# Patient Record
Sex: Male | Born: 1940
Health system: Southern US, Community
[De-identification: ages and names within clinical notes are randomized; demographics above are authoritative.]

## PROBLEM LIST (undated history)

## (undated) DIAGNOSIS — E785 Hyperlipidemia, unspecified: Secondary | ICD-10-CM

## (undated) DIAGNOSIS — F419 Anxiety disorder, unspecified: Secondary | ICD-10-CM

## (undated) DIAGNOSIS — N210 Calculus in bladder: Secondary | ICD-10-CM

## (undated) DIAGNOSIS — G4733 Obstructive sleep apnea (adult) (pediatric): Secondary | ICD-10-CM

## (undated) DIAGNOSIS — I219 Acute myocardial infarction, unspecified: Secondary | ICD-10-CM

## (undated) DIAGNOSIS — Z8551 Personal history of malignant neoplasm of bladder: Secondary | ICD-10-CM

## (undated) DIAGNOSIS — I10 Essential (primary) hypertension: Secondary | ICD-10-CM

## (undated) DIAGNOSIS — Z87442 Personal history of urinary calculi: Secondary | ICD-10-CM

## (undated) DIAGNOSIS — C61 Malignant neoplasm of prostate: Secondary | ICD-10-CM

## (undated) DIAGNOSIS — I252 Old myocardial infarction: Secondary | ICD-10-CM

## (undated) DIAGNOSIS — M1990 Unspecified osteoarthritis, unspecified site: Secondary | ICD-10-CM

## (undated) DIAGNOSIS — F329 Major depressive disorder, single episode, unspecified: Secondary | ICD-10-CM

## (undated) DIAGNOSIS — F32A Depression, unspecified: Secondary | ICD-10-CM

## (undated) DIAGNOSIS — N400 Enlarged prostate without lower urinary tract symptoms: Secondary | ICD-10-CM

## (undated) DIAGNOSIS — K219 Gastro-esophageal reflux disease without esophagitis: Secondary | ICD-10-CM

## (undated) DIAGNOSIS — C679 Malignant neoplasm of bladder, unspecified: Secondary | ICD-10-CM

## (undated) HISTORY — PX: BACK SURGERY: SHX140

## (undated) HISTORY — DX: Unspecified osteoarthritis, unspecified site: M19.90

## (undated) HISTORY — PX: PROSTATE BIOPSY: SHX241

## (undated) HISTORY — PX: CORONARY ANGIOPLASTY: SHX604

## (undated) HISTORY — PX: OTHER SURGICAL HISTORY: SHX169

## (undated) HISTORY — DX: Malignant neoplasm of bladder, unspecified: C67.9

## (undated) HISTORY — DX: Hyperlipidemia, unspecified: E78.5

## (undated) NOTE — *Deleted (*Deleted)
Physician Discharge Summary  Patient ID: [name] MRN: [id number] DOB/AGE: 08-Sep-1941 27 y.o. Primary Care Provider: Myrlene Broker, MD Primary Cardiologist: Dr. Percival Spanish.    Admit date: 08/22/2020 Discharge date: 08/29/2020  Admission Diagnoses: Unstable angina  Discharge Diagnoses:  Principal Problem:   Unstable angina [facility]) Active Problems:   Essential hypertension   Angina, class II ([place]) - New onset   Hyperlipidemia with target LDL less than 70; statin intolerant.  Now on Repatha   Severe aortic stenosis   S/P CABG x 4  Patient Active Problem List   Diagnosis Date Noted  . S/P CABG x 4 08/26/2020  . Unstable angina ([place]) 08/22/2020  . Severe aortic stenosis 11/22/2019  . Educated about COVID-19 virus infection 11/22/2019  . CAD (coronary artery disease), native coronary artery 04/22/2019  . Angina, class II ([place]) - New onset 04/22/2019  . Hyperlipidemia with target LDL less than 70; statin intolerant.  Now on Repatha 04/22/2019  . S/P lumbar laminectomy 04/04/2017  . Malignant neoplasm of prostate ([place]) 03/08/2016  . Complicated UTI (urinary tract infection) 04/21/2013  . Essential hypertension 04/21/2013  . Urinary bladder stone 04/21/2013   History of Present Illness:  At time of consultation:   Anthony Hartman lives in [place], [place] with his wife. He is very active working outside. He has had regular dental work with no ongoing issues.  He plays golf a few times per week.  He has been followed by Dr. Percival Spanish for CAD and aortic stenosis. His cardiac history dates back to 2012 when he was found to have an occluded OM, which was managed medically. He then presented with an inferior MI in 2017 and underwent PCI/BMS to RCA. Cath in 2018 showed an occluded ramus that could not be crossed. He then represented in 04/2019 with [place]. Cath at that time showed 95% 2nd diagonal stenosis managed with balloon angioplasty, with medically managed patent RCA stent with 25%  ISR, 99% OM1 stenosis (essentially a CTO), 25% mLCx stenosis, 25% pLAD stenosis, 50% mLAD stenosis, with EF 55-65% and moderate AS.  He underwent ETT in 05/2020 which did not show any high risk findings, although it was a submaximal study. Echo in 05/2020 showed EF 55-60%, mod LVH, and severe AS with a mean gradient of 46, DVI 0.2, AVA 0.7. He was seen in the office by Dr. Percival Spanish and felt to be minimally symptomatic and close follow up was recommended.   He was in his usual state of health until the early morning of 10/11 when he was awoken from sleep with chest pain. EMS was activated and he was brought to [facility]. Labs showed Cr 1.5>1.4, Hgb 18, PLT 187, LDL 68, HsTrop 247--> 736--> 1625. EKG with sinus rhythm, rate 64 bpm, chronic RBBB, new inferolateral TWI, submm STE in aVR, V1-3. CXR without acute findings. Respiratory panel negative for influenza/COVID-19. He was given SL nitro x2 and aspirin en route to the ED and IV fentanyl with resolution of chest pain upon arrival. He was started on a heparin gtt and admitted by cardiology.   Repeat echo 10/11 showed EF 50-55%, mod concentric LVH, mild hypokinesis of LV, basal anterior wasll, anterolateral wall and inferolateral wall, severe LAE, mild-mod RAE, moderate MAC, severe AS with mean gradient of 42 mm hg, peak gradient 69.6 mm hg, AVA 0.65 cm2, DVI 0.17, moderate AI and moderate PR. L/RHC 10/12 showed severe mid LAD stenosis at the takeoff of the large Diagonal branch (LAD lesion is flow limiting by pressure  wire (DFR 0.84)). The Diagonal ostial stenosis has recurred following balloon angioplasty in June 2020. He has had a functional occlusion of an obtuse marginal branch for years with prior failed PCI of this branch. It was felt he would need surgical AVR with bypass vs TAVR and high risk PCI. The intervention in the LAD and Diagonal would be difficult and there is a chance we would not have an optimal result with bifurcation stenting or primary  stenting of the LAD with angioplasty alone of the Diagonal.   Given multivessel CAD and severe AS, cardiothoracic surgery was consulted for coronary revascularization and AVR options.   He reports that he has had fatigue over the past year but has been to high activity level working in his yard and playing golf.  He has had occasional episodes of chest discomfort particularly when he is doing heavy work in his yard.  These have been very mild compared to the episode that brought him to the hospital.  He denies any shortness of breath.  He has had no dizziness or syncope.      Discharged Condition: {condition:18240}  Hospital Course: ***  Consults: {consultation:18241}  Significant Diagnostic Studies: {diagnostics:18242}   ECHOCARDIOGRAM REPORT       Patient Name:  Anthony Hartman Date of Exam: 08/22/2020  Medical Rec #: [id number]   Height:    76.0 in  Accession #:  [id number]   Weight:    230.0 lb  Date of Birth: 1940-12-30   BSA:     2.351 m  Patient Age:  58 years    BP:      117/65 mmHg  Patient Gender: M       HR:      62 bpm.  Exam Location: Inpatient   Procedure: 2D Echo, Cardiac Doppler and Color Doppler   Indications:  R07.9* Chest pain, unspecified    History:    Patient has prior history of Echocardiogram examinations,  most         recent 05/23/2020. Previous Myocardial Infarction; Risk         Factors:Hypertension, Dyslipidemia and Sleep Apnea.  Cancer.         GERD.    Sonographer:  Jonelle Sidle Dance  Referring Phys: [id number] [place]    1. Left ventricular ejection fraction, by estimation, is 50 to 55%. The  left ventricle has low normal function. The left ventricle demonstrates  regional wall motion abnormalities (see scoring diagram/findings for  description). There is moderate  concentric left ventricular hypertrophy. Left ventricular diastolic   parameters are consistent with Grade I diastolic dysfunction (impaired  relaxation). There is mild hypokinesis of the left ventricular, basal  anterior wall, anterolateral wall and  inferolateral wall.  2. Right ventricular systolic function is normal. The right ventricular  size is normal.  3. Left atrial size was severely dilated.  4. Right atrial size was mild to moderately dilated.  5. The mitral valve is normal in structure. Trivial mitral valve  regurgitation. No evidence of mitral stenosis. Moderate mitral annular  calcification.  6. The aortic valve is calcified. There is severe calcifcation of the  aortic valve. There is severe thickening of the aortic valve. Aortic valve  regurgitation is moderate. Severe aortic valve stenosis. Aortic valve  area, by VTI measures 0.63 cm. Aortic  valve mean gradient measures 42.0 mmHg. Aortic valve Vmax measures 4.17  m/s.  7. Pulmonic valve regurgitation is moderate.  8. The inferior vena cava is  normal in size with greater than 50%  respiratory variability, suggesting right atrial pressure of 3 mmHg.  9. Possible left to right shunt cannot be excluded.   Comparison(s): Changes from prior study are noted.   Conclusion(s)/Recommendation(s): Severe AS with moderate AI. EF overal low  normal, but on several views the basal anterior, anteriolateral, and  inferolateral walls are mildly hypokinetic.   FINDINGS  Left Ventricle: Left ventricular ejection fraction, by estimation, is 50  to 55%. The left ventricle has low normal function. The left ventricle  demonstrates regional wall motion abnormalities. Mild hypokinesis of the  left ventricular, basal anterior  wall, anterolateral wall and inferolateral wall. The left ventricular  internal cavity size was normal in size. There is moderate concentric left  ventricular hypertrophy. Left ventricular diastolic parameters are  consistent with Grade I diastolic  dysfunction (impaired  relaxation).   Right Ventricle: The right ventricular size is normal. No increase in  right ventricular wall thickness. Right ventricular systolic function is  normal.   Left Atrium: Left atrial size was severely dilated.   Right Atrium: Right atrial size was mild to moderately dilated.   Pericardium: There is no evidence of pericardial effusion. Presence of  pericardial fat pad.   Mitral Valve: The mitral valve is normal in structure. There is moderate  thickening of the mitral valve leaflet(s). There is mild calcification of  the mitral valve leaflet(s). Moderate mitral annular calcification.  Trivial mitral valve regurgitation. No  evidence of mitral valve stenosis.   Tricuspid Valve: The tricuspid valve is normal in structure. Tricuspid  valve regurgitation is trivial. No evidence of tricuspid stenosis.   Aortic Valve: The aortic valve is calcified. There is severe calcifcation  of the aortic valve. There is severe thickening of the aortic valve. There  is moderate aortic valve annular calcification. Aortic valve regurgitation  is moderate. Aortic  regurgitation PHT measures 365 msec. Severe aortic stenosis is present.  Aortic valve mean gradient measures 42.0 mmHg. Aortic valve peak gradient  measures 69.6 mmHg. Aortic valve area, by VTI measures 0.63 cm.   Pulmonic Valve: The pulmonic valve was grossly normal. Pulmonic valve  regurgitation is moderate. No evidence of pulmonic stenosis.   Aorta: The aortic root and ascending aorta are structurally normal, with  no evidence of dilitation.   Venous: The inferior vena cava is normal in size with greater than 50%  respiratory variability, suggesting right atrial pressure of 3 mmHg.   IAS/Shunts: Possible left to right shunt cannot be excluded.     LEFT VENTRICLE  PLAX 2D  LVIDd:     4.95 cm Diastology  LVIDs:     4.27 cm LV e' medial:  3.48 cm/s  LV PW:     1.38 cm LV E/e' medial: 17.7  LV IVS:     1.42 cm LV e' lateral:  5.55 cm/s  LVOT diam:   2.20 cm LV E/e' lateral: 11.1  LV SV:     72  LV SV Index:  31  LVOT Area:   3.80 cm     RIGHT VENTRICLE      IVC  RV Basal diam: 3.28 cm  IVC diam: 1.69 cm  RV Mid diam:  2.06 cm  RV S prime:   7.94 cm/s  TAPSE (M-mode): 1.8 cm   LEFT ATRIUM       Index    RIGHT ATRIUM      Index  LA diam:    4.80 cm 2.04 cm/m RA Area:  20.80 cm  LA Vol (A2C):  105.0 ml 44.67 ml/m RA Volume:  67.90 ml 28.88 ml/m  LA Vol (A4C):  172.0 ml 73.17 ml/m  LA Biplane Vol: 148.0 ml 62.96 ml/m  AORTIC VALVE  AV Area (Vmax):  0.65 cm  AV Area (Vmean):  0.63 cm  AV Area (VTI):   0.63 cm  AV Vmax:      417.00 cm/s  AV Vmean:     301.000 cm/s  AV VTI:      1.140 m  AV Peak Grad:   69.6 mmHg  AV Mean Grad:   42.0 mmHg  LVOT Vmax:     71.70 cm/s  LVOT Vmean:    49.800 cm/s  LVOT VTI:     0.189 m  LVOT/AV VTI ratio: 0.17  AI PHT:      365 msec    AORTA  Ao Root diam: 3.20 cm  Ao Asc diam: 3.40 cm   MITRAL VALVE  MV Area (PHT): 2.62 cm  SHUNTS  MV Decel Time: 289 msec  Systemic VTI: 0.19 m  MV E velocity: 61.60 cm/s Systemic Diam: 2.20 cm  MV A velocity: 77.10 cm/s  MV E/A ratio: 0.80   Bridgette Christopher MD  Electronically signed by Buford Dresser MD  Signature Date/Time: 08/22/2020/3:37:03 PM       Cardiac Cath: INTRAVASCULAR PRESSURE WIRE/FFR STUDY  RIGHT/LEFT HEART CATH AND CORONARY ANGIOGRAPHY  Conclusion   Severe aortic stenosis with recent progression in symptoms causing significant limitation in physical activity.  Unable to cross the aortic valve for hemodynamic assessment.  [place] 111 mid vessel bifurcation stenosis.  The previously dilated diagonal branch has restenosed to greater than 90%.  The previously untreated LAD has DFR of 0.84.  Obtuse marginal #1 with 99% stenosis with competitive flow.   This artery functions as a total occlusion and has had 2 failed PCI attempts in the past.  Left main is widely patent  RCA is dominant, tortuous, but does not have any focal high-grade stenosis.  There is diffuse disease throughout the vessel up to 40 to 50%.  Normal pulmonary artery pressures.  Pulmonary capillary wedge mean pressure is 9 mmHg.  RECOMMENDATIONS:   Symptomatic aortic stenosis and angina due to significant obtuse marginal and LAD diagonal disease.  Heart valve team consideration of complex PCI followed by TAVR versus aortic valve replacement along with coronary bypass grafting to the LAD, diagonal, and first obtuse marginal.     Treatments: {Tx:18249}  Discharge Exam: Blood pressure 127/62, pulse 73, temperature 97.8 F (36.6 C), temperature source Oral, resp. rate (!) 9, height _0  (1.93 m), weight 108.1 kg, SpO2 96 %. {physical QTMA:2633354}  Disposition:   Discharge Instructions    AMB Referral to Cardiac Rehabilitation - Phase II   Complete by: As directed    Diagnosis:  CABG Valve Replacement     Valve: Aortic   CABG X ___: 4   After initial evaluation and assessments completed: Virtual Based Care may be provided alone or in conjunction with Phase 2 Cardiac Rehab based on patient barriers.: Yes     Allergies as of 08/29/2020      Reactions   Statins    MD ORDERS UNSPECIFIED REACTION     Augmentin [amoxicillin-pot Clavulanate] Nausea And Vomiting   Has patient had a PCN reaction causing immediate rash, facial/tongue/throat swelling, SOB or lightheadedness with hypotension: No Has patient had a PCN reaction causing severe rash involving mucus membranes or skin necrosis: No Has patient  had a PCN reaction that required hospitalization No Has patient had a PCN reaction occurring within the last 10 years: No If all of the above answers are "NO", then may proceed with Cephalosporin use.   Dexamethasone Other (See Comments)   Frequent Urination [?  HYPERGLYCEMIA? ]   Sulfa Antibiotics Nausea And Vomiting    Med Rec must be completed prior to using this [place]***       Follow-up Information    Minus Breeding, MD Follow up.   Specialty: Cardiology Why: Please see discharge paperwork for follow-up appointments with cardiology.  Appointments are also available on [place]. Contact information: [address] [place] [address] [phone]        Gaye Pollack, MD Follow up.   Specialty: Cardiothoracic Surgery Why: Please see discharge paperwork for follow-up appointments with surgeon.  On the date you see Dr. Cyndia Bent please also obtain a chest x-ray at [place] 1/2-hour prior to appointment.  It is located in the same office complex on the first floor. Contact information: [place] [address] [phone]              The patient has been discharged on:   1.Beta Blocker:  Yes [   ]                              No   [   ]                              If No, reason:  2.Ace Inhibitor/ARB: Yes [   ]                                     No  [    ]                                     If No, reason:  3.Statin:   Yes [   ]                  No  [   ]                  If No, reason:  4.Ecasa:  Yes  [   ]                  No   [   ]                  If No, reason:  Signed: John Giovanni 08/29/2020, 8:43 AM

---

## 2000-01-12 ENCOUNTER — Encounter: Payer: Self-pay | Admitting: Urology

## 2000-01-12 ENCOUNTER — Encounter: Admission: RE | Admit: 2000-01-12 | Discharge: 2000-01-12 | Payer: Self-pay | Admitting: Urology

## 2000-01-15 ENCOUNTER — Ambulatory Visit (HOSPITAL_BASED_OUTPATIENT_CLINIC_OR_DEPARTMENT_OTHER): Admission: RE | Admit: 2000-01-15 | Discharge: 2000-01-15 | Payer: Self-pay | Admitting: Urology

## 2000-01-15 ENCOUNTER — Encounter (INDEPENDENT_AMBULATORY_CARE_PROVIDER_SITE_OTHER): Payer: Self-pay | Admitting: *Deleted

## 2000-07-22 ENCOUNTER — Ambulatory Visit (HOSPITAL_BASED_OUTPATIENT_CLINIC_OR_DEPARTMENT_OTHER): Admission: RE | Admit: 2000-07-22 | Discharge: 2000-07-22 | Payer: Self-pay | Admitting: Urology

## 2000-07-22 ENCOUNTER — Encounter (INDEPENDENT_AMBULATORY_CARE_PROVIDER_SITE_OTHER): Payer: Self-pay | Admitting: Specialist

## 2002-05-19 ENCOUNTER — Encounter: Admission: RE | Admit: 2002-05-19 | Discharge: 2002-05-19 | Payer: Self-pay | Admitting: Urology

## 2002-05-19 ENCOUNTER — Encounter: Payer: Self-pay | Admitting: Urology

## 2002-05-22 ENCOUNTER — Encounter (INDEPENDENT_AMBULATORY_CARE_PROVIDER_SITE_OTHER): Payer: Self-pay | Admitting: Specialist

## 2002-05-22 ENCOUNTER — Ambulatory Visit (HOSPITAL_BASED_OUTPATIENT_CLINIC_OR_DEPARTMENT_OTHER): Admission: RE | Admit: 2002-05-22 | Discharge: 2002-05-22 | Payer: Self-pay | Admitting: Urology

## 2002-06-08 ENCOUNTER — Encounter: Admission: RE | Admit: 2002-06-08 | Discharge: 2002-06-08 | Payer: Self-pay | Admitting: Urology

## 2002-06-08 ENCOUNTER — Encounter: Payer: Self-pay | Admitting: Urology

## 2002-09-21 ENCOUNTER — Ambulatory Visit (HOSPITAL_BASED_OUTPATIENT_CLINIC_OR_DEPARTMENT_OTHER): Admission: RE | Admit: 2002-09-21 | Discharge: 2002-09-21 | Payer: Self-pay | Admitting: Urology

## 2002-09-21 ENCOUNTER — Encounter (INDEPENDENT_AMBULATORY_CARE_PROVIDER_SITE_OTHER): Payer: Self-pay

## 2003-02-10 ENCOUNTER — Emergency Department (HOSPITAL_COMMUNITY): Admission: EM | Admit: 2003-02-10 | Discharge: 2003-02-10 | Payer: Self-pay | Admitting: Emergency Medicine

## 2003-02-10 ENCOUNTER — Encounter: Payer: Self-pay | Admitting: Emergency Medicine

## 2004-01-06 ENCOUNTER — Encounter: Admission: RE | Admit: 2004-01-06 | Discharge: 2004-01-06 | Payer: Self-pay | Admitting: Urology

## 2004-01-10 ENCOUNTER — Ambulatory Visit (HOSPITAL_COMMUNITY): Admission: RE | Admit: 2004-01-10 | Discharge: 2004-01-10 | Payer: Self-pay | Admitting: Urology

## 2004-01-10 ENCOUNTER — Ambulatory Visit (HOSPITAL_BASED_OUTPATIENT_CLINIC_OR_DEPARTMENT_OTHER): Admission: RE | Admit: 2004-01-10 | Discharge: 2004-01-10 | Payer: Self-pay | Admitting: Urology

## 2004-01-10 ENCOUNTER — Encounter (INDEPENDENT_AMBULATORY_CARE_PROVIDER_SITE_OTHER): Payer: Self-pay | Admitting: Specialist

## 2004-01-10 HISTORY — PX: TRANSURETHRAL RESECTION OF BLADDER TUMOR: SHX2575

## 2006-01-21 ENCOUNTER — Ambulatory Visit (HOSPITAL_COMMUNITY): Admission: RE | Admit: 2006-01-21 | Discharge: 2006-01-21 | Payer: Self-pay | Admitting: Urology

## 2006-08-20 ENCOUNTER — Encounter (INDEPENDENT_AMBULATORY_CARE_PROVIDER_SITE_OTHER): Payer: Self-pay | Admitting: *Deleted

## 2006-08-20 ENCOUNTER — Ambulatory Visit (HOSPITAL_BASED_OUTPATIENT_CLINIC_OR_DEPARTMENT_OTHER): Admission: RE | Admit: 2006-08-20 | Discharge: 2006-08-20 | Payer: Self-pay | Admitting: Urology

## 2007-02-11 HISTORY — PX: OTHER SURGICAL HISTORY: SHX169

## 2007-08-11 ENCOUNTER — Ambulatory Visit (HOSPITAL_BASED_OUTPATIENT_CLINIC_OR_DEPARTMENT_OTHER): Admission: RE | Admit: 2007-08-11 | Discharge: 2007-08-11 | Payer: Self-pay | Admitting: Urology

## 2007-08-11 ENCOUNTER — Encounter: Payer: Self-pay | Admitting: Urology

## 2007-12-30 ENCOUNTER — Encounter: Payer: Self-pay | Admitting: Urology

## 2007-12-30 ENCOUNTER — Ambulatory Visit (HOSPITAL_BASED_OUTPATIENT_CLINIC_OR_DEPARTMENT_OTHER): Admission: RE | Admit: 2007-12-30 | Discharge: 2007-12-30 | Payer: Self-pay | Admitting: Urology

## 2009-01-28 ENCOUNTER — Encounter: Admission: RE | Admit: 2009-01-28 | Discharge: 2009-01-28 | Payer: Self-pay | Admitting: Family Medicine

## 2009-02-10 ENCOUNTER — Encounter: Admission: RE | Admit: 2009-02-10 | Discharge: 2009-02-10 | Payer: Self-pay | Admitting: Family Medicine

## 2009-03-03 ENCOUNTER — Encounter: Admission: RE | Admit: 2009-03-03 | Discharge: 2009-03-03 | Payer: Self-pay | Admitting: Family Medicine

## 2009-05-25 ENCOUNTER — Encounter: Admission: RE | Admit: 2009-05-25 | Discharge: 2009-05-25 | Payer: Self-pay | Admitting: Family Medicine

## 2009-08-21 HISTORY — PX: OTHER SURGICAL HISTORY: SHX169

## 2009-09-21 ENCOUNTER — Ambulatory Visit (HOSPITAL_COMMUNITY): Admission: RE | Admit: 2009-09-21 | Discharge: 2009-09-22 | Payer: Self-pay | Admitting: Orthopaedic Surgery

## 2010-11-12 DIAGNOSIS — I219 Acute myocardial infarction, unspecified: Secondary | ICD-10-CM

## 2010-11-12 HISTORY — DX: Acute myocardial infarction, unspecified: I21.9

## 2011-02-14 LAB — URINALYSIS, ROUTINE W REFLEX MICROSCOPIC
Glucose, UA: NEGATIVE mg/dL
Hgb urine dipstick: NEGATIVE
Ketones, ur: NEGATIVE mg/dL
Nitrite: NEGATIVE
Protein, ur: NEGATIVE mg/dL
Specific Gravity, Urine: 1.029 (ref 1.005–1.030)
Urobilinogen, UA: 0.2 mg/dL (ref 0.0–1.0)
pH: 5.5 (ref 5.0–8.0)

## 2011-02-14 LAB — DIFFERENTIAL
Basophils Absolute: 0.1 10*3/uL (ref 0.0–0.1)
Basophils Relative: 1 % (ref 0–1)
Eosinophils Absolute: 0.2 10*3/uL (ref 0.0–0.7)
Eosinophils Relative: 2 % (ref 0–5)
Lymphocytes Relative: 20 % (ref 12–46)
Lymphs Abs: 1.3 10*3/uL (ref 0.7–4.0)
Monocytes Absolute: 0.6 10*3/uL (ref 0.1–1.0)
Monocytes Relative: 9 % (ref 3–12)
Neutro Abs: 4.4 10*3/uL (ref 1.7–7.7)
Neutrophils Relative %: 67 % (ref 43–77)

## 2011-02-14 LAB — COMPREHENSIVE METABOLIC PANEL
ALT: 36 U/L (ref 0–53)
AST: 34 U/L (ref 0–37)
Albumin: 3.9 g/dL (ref 3.5–5.2)
Alkaline Phosphatase: 57 U/L (ref 39–117)
BUN: 13 mg/dL (ref 6–23)
CO2: 30 mEq/L (ref 19–32)
Calcium: 9.4 mg/dL (ref 8.4–10.5)
Chloride: 103 mEq/L (ref 96–112)
Creatinine, Ser: 0.97 mg/dL (ref 0.4–1.5)
GFR calc Af Amer: >60 mL/min (ref >60)
GFR calc non Af Amer: >60 mL/min (ref >60)
Glucose, Bld: 100 mg/dL — ABNORMAL HIGH (ref 70–99)
Potassium: 4.7 mEq/L (ref 3.5–5.1)
Sodium: 139 mEq/L (ref 135–145)
Total Bilirubin: 0.5 mg/dL (ref 0.3–1.2)
Total Protein: 5.9 g/dL — ABNORMAL LOW (ref 6.0–8.3)

## 2011-02-14 LAB — CBC
HCT: 47.1 % (ref 39.0–52.0)
Hemoglobin: 16.2 g/dL (ref 13.0–17.0)
MCHC: 34.4 g/dL (ref 30.0–36.0)
MCV: 98 fL (ref 78.0–100.0)
Platelets: 261 10*3/uL (ref 150–400)
RBC: 4.81 MIL/uL (ref 4.22–5.81)
RDW: 12.8 % (ref 11.5–15.5)
WBC: 6.6 10*3/uL (ref 4.0–10.5)

## 2011-03-27 NOTE — Op Note (Signed)
NAME:  Anthony Hartman, Anthony Hartman                ACCOUNT NO.:  [id number]   MEDICAL RECORD NO.:  [id number]          PATIENT TYPE:  AMB   LOCATION:  [facility]                         FACILITY:  [facility]   PHYSICIAN:  Bertram Millard. 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   PROCEDURES:  1. Cystoscopy.  2. Bilateral retrograde ureteropyelograms.  3. Bladder biopsies.   SURGEON:  Bertram Millard. Dahlstedt, M.D.   ANESTHESIA:  General with LMA.   COMPLICATIONS:  None.   BRIEF HISTORY:  Anthony Hartman is a 70 year old gentleman who is a long-term  smoker.  He has a history of carcinoma in situ of the bladder.  He has  had several TURBTs and has had both induction and maintenance BCG  treatment.  He has actually had a normal follow-up until recently, when  he had a positive FISH.  Recent cystoscopy revealed some mild  abnormalities of the bladder wall.  It was recommended that he undergo  cystoscopy and bladder biopsies as well as bilateral retrograde  ureteropyelograms to rule out upper tract lesions.  Risks and  complications have been discussed with the patient.  He understands  these and desires to proceed.   DESCRIPTION OF PROCEDURE:  The patient was identified in the holding  area, he received preoperative IV antibiotics and was taken to the  operating room, where a general anesthetic was administered using the  LMA.  He was placed in the dorsal lithotomy position.  Genitalia and  perineum were prepped and draped.  A 22-French panendoscope was advanced  through his urethra.  All aspects of his urethra were normal.  Minimal  obstruction of the prostate.  No prostatic urethral lesions.  Bladder  entered and  inspected circumferentially.  No discrete tumors were noted.  Two to three small erythematous areas were seen in areas consistent with  past TURBTs.  No other discrete lesions were seen.  The was very mild  trabeculation of the bladder.  Bilateral retrograde pyelograms were  performed.   Bilaterally, the ureters were normal.  Pyelocaliceal systems were  normal.  I saw no evidence of ureteral or renal pelvic or caliceal  filling defects on either side.   After the retrogrades were performed, bladder biopsies were taken of  these erythematous areas.  They were sent as, bladder biopsies.  The  biopsy sites were then coagulated with electrocautery.  An area  surrounding the biopsy sites  was also coagulated to ablate all abnormal-looking tissue.  At this  point the scope was removed after the bladder was drained.   The patient tolerated the procedure well.  He was awakened and taken to  the PACU in stable condition.      Bertram Millard. Dahlstedt, M.D.  Electronically Signed     SMD/MEDQ  D:  01/12/2008  T:  01/12/2008  Job:  [id number]   cc:   [name], MD  Fax: [phone]

## 2011-03-27 NOTE — Op Note (Signed)
NAME:  Anthony Hartman, Anthony Hartman                ACCOUNT NO.:  [id number]   MEDICAL RECORD NO.:  [id number]          PATIENT TYPE:  AMB   LOCATION:  [facility]                         [facility]   PHYSICIAN:  Bertram Millard. Dahlstedt, M.D.DATE OF BIRTH:  [birth year]   DATE OF PROCEDURE:  08/11/2007  DATE OF DISCHARGE:                               OPERATIVE REPORT   PREOPERATIVE DIAGNOSIS:  History of carcinoma in situ of bladder, status  post prior resections and BCG x2.   POSTOPERATIVE DIAGNOSIS:  History of carcinoma in situ of bladder,  status post prior resections and BCG x2.   SURGICAL PROCEDURES:  Cystoscopy, bladder biopsies (random and  directed), bilateral renal washings, bilateral retrograde ureteral  pyelograms.   SURGEON:  Bertram Millard. Dahlstedt, M.D.   ANESTHESIA:  General with LMA.   COMPLICATIONS:  None.   SPECIMENS:  To pathology.   BRIEF HISTORY:  This 70 year old male has a several year history of  carcinoma in situ of the bladder.  He has had recurrence despite a prior  course of BCG.  He has completed a second course within the past year to  year and a half.  Recent FISH was positive.  He has some very mild  erythematous patches in his bladder.   As the patient has possible recurrence of his CIS, it was recommended  that he undergo full upper tract evaluation and cystoscopy with bladder  biopsy.  Risks and complications of the procedure were discussed with  the patient.  He understands these and desires to proceed.   DESCRIPTION OF PROCEDURE:  Preoperative IV antibiotics were  administered, the patient was identified in the holding area and then  taken to the operating room where general anesthetic was administered  using LMA.  He was placed in the dorsal lithotomy position.  Genitalia  and perineum were prepped and draped.  A 22-French panendoscope was  advanced through his urethra.  Prostate was not obstructive.  Urethra  was normal.  The bladder had a couple of small  erythematous patches with  some scarring from prior biopsies.  These were in the trigonal or  posterior wall area.  Biopsies were taken at these three sites +1 biopsy  from nearby surrounding normal appearing area.  The base of the bladder  biopsies was then electrocoagulated.   Bilateral renal washings were then performed.  I had to use a guidewire  through a 5-French open-end catheter to navigate the catheter up into  the renal pelves bilaterally.  Using saline, bilateral renal washings  were taken separately.  These were sent as right and left renal  washings, respectively.   Retrograde ureteral pyelograms were then performed bilaterally.  These  showed normal ureters, and normal renal pelves and caliceal systems  bilaterally.  There is no evidence of filling defects or stricture of  the ureters.   At this point the bladder was drained and the procedure terminated.  The  patient was awakened after B & O suppository was placed.  He was taken  to PACU in stable condition.   He will follow-up in approximately 3 weeks.  Discharge medications  include Urelle one p.o. q.6 h p.r.n. urinary discomfort and Cipro 250 mg  one p.o. b.i.d. #6Bertram Millard. Dahlstedt, M.D.  Electronically Signed     SMD/MEDQ  D:  08/11/2007  T:  08/11/2007  Job:  [id number]   cc:   [name], MD  Fax: [phone]

## 2011-03-30 NOTE — Op Note (Signed)
NAME:  Anthony Hartman, Anthony Hartman                          ACCOUNT NO.:  [id number]   MEDICAL RECORD NO.:  [id number]                   PATIENT TYPE:  AMB   LOCATION:  [facility]                                 FACILITY:  [facility]   PHYSICIAN:  Bertram Millard. Dahlstedt, M.D.          DATE OF BIRTH:  27-May-1941   DATE OF PROCEDURE:  01/10/2004  DATE OF DISCHARGE:                                 OPERATIVE REPORT   PREOPERATIVE DIAGNOSIS:  Recurrent transitional cell carcinoma of the  bladder.   POSTOPERATIVE DIAGNOSIS:  Recurrent transitional cell carcinoma of the  bladder.   OPERATION/PROCEDURE:  1. Cystoscopy.  2. Transurethral resection of bladder tumor, small.  3. Random bladder biopsies.   SURGEON:  Bertram Millard. Dahlstedt, M.D.   ANESTHESIA:  General with LMA.   COMPLICATIONS:  None.   BRIEF HISTORY:  This nice 70 year old male was first diagnosed with  carcinoma in situ of his bladder about two years ago.  He has been on BCG  and a research protocol with proper followup/maintenance with BCG as well as  multivitamins.   On routine followup recently, the patient had no urinary symptoms or  hematuria but was found to have  a recurrent lesion near his left ureteral  orifice.  This was found cystoscopically.  At this point, the patient  presents for repeat TURBT.  He is aware of the risks and complications.   DESCRIPTION OF PROCEDURE:  The patient was administered preoperative IV  antibiotics and taken to the operating room where general anesthetic was  administered using the LMA.  He was placed in the dorsal lithotomy position  with genitalia and perineum prepped and draped.  A 22-French panendoscope  was advanced through this urethra which was normal.  Prostate was minimally  obstructive with trilobar hypertrophy.  The bladder was inspected  circumferentially.  There were two to three erythematous patches in the  posterior bladder wall with one small regrowth of some yellowish papillary  tissue  just near the left ureteral orifice.  This was biopsied and removed  taking care to avoid injury to the ureteral orifice.  This was sent as left  lateral wall biopsy.  Random bladder biopsies were taken of the other two to  three erythematous patches.  No other papillary or other lesions were seen.  There was some scarring at the previous biopsy sites.  After biopsies were  completed, all sites were coagulated with the [place].  At this point no  bleeding was seen.  The bladder was drained and the procedure terminated.   A B&O suppository had been placed preoperatively.  The patient was awakened  and taken to the PACU in stable condition.   He will follow up per routine per our research nurse, Valli Glance.  He was  discharged on Bactrim DS one p.o. b.i.d. for three days and Urelle one p.o.  q.6h. p.r.n. urinary discomfort or frequency.  He also has oxycodone at  home.                                               Bertram Millard. Dahlstedt, M.D.    SMD/MEDQ  D:  01/10/2004  T:  01/10/2004  Job:  [id number]

## 2011-03-30 NOTE — Op Note (Signed)
[facility]  Patient:    MONTRAIL, MEHRER Visit Number: [id number] MRN: [id number]          Service Type: NES Location: [facility] Attending Physician:  Liborio Nixon Dictated by:   Bertram Millard. Dahlstedt, M.D. Proc. Date: 05/22/02 Admit Date:  05/22/2002 Discharge Date: 05/22/2002                             Operative Report  PREOPERATIVE DIAGNOSIS:  Bladder lesion.  POSTOPERATIVE DIAGNOSIS:  Bladder lesion.  PRINCIPAL PROCEDURE:  Cystoscopy, bladder biopsy.  SURGEON:  Bertram Millard. Dahlstedt, M.D.  ANESTHESIA:  General.  COMPLICATIONS:  None.  BRIEF HISTORY:  This middle-aged male has bladder lesions and persistent hematuria. He is status post biopsies and cytologies in the past with no evidence of cancer. However, his most recent cytology revealed abnormal urothelial cells, suspicious in nature. He presents at this time for cystoscopy and biopsy.  DESCRIPTION OF PROCEDURE:  A general anesthetic was established in this gentleman who was then placed in the dorsal lithotomy position. His genitalia and perineum were prepped and draped. A 22 French panendoscope was advanced into his bladder. There was an erythematous patch at the dome of his bladder. There was no evidence of papillary tumor in this area. This was biopsied x2-3 times, getting deep specimens. No other bladder lesions were seen. There was a bladder neck biopsy taken. The biopsy sites were cauterized. The bladder was drained and the procedure terminated. The patient tolerated the procedure well and was awakened, taken to the PACU in stable condition. Dictated by:   Bertram Millard. Dahlstedt, M.D. Attending Physician:  Liborio Nixon DD:  06/04/02 TD:  06/08/02 Job: [id number]

## 2011-03-30 NOTE — Op Note (Signed)
NAME:  Anthony Hartman, Anthony Hartman                ACCOUNT NO.:  [id number]   MEDICAL RECORD NO.:  [id number]          PATIENT TYPE:  AMB   LOCATION:  [facility]                         FACILITY:  [facility]   PHYSICIAN:  Bertram Millard. Dahlstedt, M.D.DATE OF BIRTH:  07-30-1941   DATE OF PROCEDURE:  08/20/2006  DATE OF DISCHARGE:                                 OPERATIVE REPORT   PREOPERATIVE DIAGNOSIS:  History of carcinoma in situ of bladder, status  post BCG with positive cytology recently.   POSTOPERATIVE DIAGNOSIS:  History of carcinoma in situ of bladder, status  post BCG with positive cytology recently.   PROCEDURE:  Cystoscopy, bilateral retrograde ureteral pyelogram, bladder  biopsy.   SURGEON:  Bertram Millard. Dahlstedt, M.D.   ANESTHESIA:  General LMA.   COMPLICATIONS:  None.   BRIEF HISTORY:  Anthony Hartman is a 70 year old gentleman who I have been  seeing for some time.  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  He is status  post BCG and has been on a study for that.  He is on maintenance for two  years.  He was recently seen in the office doing well.  Fish returned  positive.  Cystoscopy revealed one small erythematous area.  He presents at  this time for cystoscopy, bilateral retrogrades, bladder biopsy.  The risks  and complications have been discussed with the patient who understands these  and agrees to proceed.   DESCRIPTION OF PROCEDURE:  Preoperative IV antibiotics were administered and  the patient was identified in the holding area.  He was taken to the  operating room where genitalia and perineum were prepped and draped after a  general anesthetic was administered.  He had been placed in the dorsal  lithotomy position.  A 22-French panendoscope was advanced into the bladder.  The urethra was normal.  The prostate nonobstructive.  The bladder entered  and inspected circumferentially.  Several small stellate scars.   There were  three small patchy areas of raised urothelium on the left posterior bladder  wall located fairly superiorly.  These were all biopsied.  Three biopsies  were taken altogether and these areas were cauterized.  No other bladder  lesions were seen.  There were no trabeculations or foreign bodies.  The  ureteral orifices were normal.  Bilateral retrogrades were performed.  These  revealed normal appearing ureters.  An air bubble was present at the right  UPJ which cleared with pressure.  No filling defects of the pyelocaliceal  systems were seen.  At this point, the bladder was decompressed and the  scope removed.  The patient tolerated the procedure well.  He was awakened  and taken to the PACU in stable condition.     Bertram Millard. Dahlstedt, M.D.  Electronically Signed    SMD/MEDQ  D:  08/20/2006  T:  08/21/2006  Job:  [id number]

## 2011-03-30 NOTE — Op Note (Signed)
[facility]  Patient:    Anthony Hartman, Anthony Hartman                       MRN: [id number] Proc. Date: 01/15/00 Adm. Date:  [id number] Attending:  Liborio Nixon CC:         Dr. Nadine Counts in [place]                           Operative Report  PREOPERATIVE DIAGNOSIS:  Bladder lesion, hematuria.  POSTOPERATIVE DIAGNOSIS:  Bladder lesion, hematuria.  PRINCIPAL PROCEDURE:  Cystoscopy and bladder biopsy.  SURGEON:  Bertram Millard. Dahlstedt, M.D.  ANESTHESIA:  General with LMA.  COMPLICATIONS:  None.  BRIEF HISTORY:  This middle-aged male has been followed by me for organic impotence and hypogonadism for some time.  On a recent evaluation, he was found to have microscopic hematuria.  It was recommended that he undergo IVP and cystoscopy. The cystoscopy showed lesions on the left side of his bladder with possibly an early papillary lesion present.  IVP was negative.  It was recommended that we undergo anesthetic, cystoscopy, and biopsy.  We present for that procedure.  DESCRIPTION OF PROCEDURE:  The patient was administered a general anesthetic using the LMA.  He was placed in the dorsal lithotomy position.  Genitalia and perineum were prepped and draped.  A 21-French panendoscope was placed in his bladder. he bladder was inspected circumferentially and found to be very minimally trabeculated.  The bladder mucosa was normal except for the left posterior wall  which had some erythematous area with perhaps very early papillary lesions present. There were two areas approximately 1 cm in size with some abnormalities.  The rest of the bladder was totally normal.  Ureteral orifices were normal in configuration and location.  The cold cup biopsy instrument was used to biopsy tissue from these two sites.  The biopsies were taken.  They were both sent together as bladder lesion.  No bleeding was seen, but these lesions were totally cauterized  using he Bugbee electrode.  The bladder was drained, and the procedure terminated, and the patient awakened.  He was transported to PACU in stable condition. DD:  01/15/00 TD:  01/16/00 Job: [id number]

## 2011-03-30 NOTE — Op Note (Signed)
[place]  Patient:    Anthony Hartman, Anthony Hartman                       MRN: [id number] Proc. Date: 07/22/00 Adm. Date:  [id number] Attending:  Liborio Nixon CC:         Dr. Nadine Counts   Operative Report  PREOPERATIVE DIAGNOSIS:  Bladder lesions.  POSTOPERATIVE DIAGNOSIS:  Bladder lesions.  OPERATION PERFORMED:  Cystoscopy, bladder biopsy.  SURGEON:  Bertram Millard. Dahlstedt, M.D.  ANESTHESIA:  General with LMA.  COMPLICATIONS:  None.  INDICATIONS FOR PROCEDURE:  The patient is a 70 year old male with recurrent hematuria.  He underwent biopsy within the past year.  This was then indeterminate but had some dysplasia.  He has had persistent lesions in his bladder.  The patient is a smoker.  It was recommended that he undergo repeat biopsy rule out to transitional cell carcinoma.  The risks and complications of this procedure had been discussed with the patient and are understood.  He desires to proceed.  DESCRIPTION OF PROCEDURE:  The patient was administered a general anesthetic and placed in a dorsal lithotomy position.  The genitalia and perineum were prepped and draped.  A 25 French panendoscope was advanced directly through his urethra and into his bladder.  The urethra was without lesions, the prostate was minimally obstructed.  The bladder was entered and inspected circumferentially.  No discrete tumors were noted.  On the left side wall there were several erythematous, raise patches.  No other significant abnormalities were seen within the bladder.  Both ureteral orifices were normal in their configuration and location.  There were mild trabeculations scattered throughout.  Directed biopsies were taken at the the three or four erythematous patches.  They were sent together as one specimen labeled bladder biopsy.  At this point the procedure was terminated after the biopsy sites were electrocoagulated with a Bugbee electrode.  The  bladder was drained and the procedure terminated.  The patient tolerated the procedure well.  He was taken to the PACU in stable condition. DD:  07/22/00 TD:  07/22/00 Job: [id number]

## 2011-03-30 NOTE — Op Note (Signed)
NAME:  Anthony Hartman, Anthony Hartman                          ACCOUNT NO.:  [id number]   MEDICAL RECORD NO.:  [id number]                   PATIENT TYPE:  AMB   LOCATION:  [facility]                                 FACILITY:  [facility]   PHYSICIAN:  Bertram Millard. Dahlstedt, M.D.          DATE OF BIRTH:  [birth year]   DATE OF PROCEDURE:  09/21/2002  DATE OF DISCHARGE:                                 OPERATIVE REPORT   PREOPERATIVE DIAGNOSIS:  Carcinoma in situ of bladder, status post biopsy  and BCG treatment.   POSTOPERATIVE DIAGNOSIS:  Carcinoma in situ of bladder, status post biopsy  and BCG treatment.   PRINCIPAL PROCEDURE:  1. Cystoscopy.  2. Bladder biopsy.  3. Bilateral retrograde ureteropyelograms.  4. Bilateral renal washings.   SURGEON:  Bertram Millard. Dahlstedt, M.D.   ANESTHESIA:  General with LMA.   COMPLICATIONS:  None.   BRIEF HISTORY:  A 70 year old male with a prior history of CIS of the  bladder.  He is status post a six week course of BCG.   Recystoscopy 2-3 weeks ago revealed a couple of patches of velvety  urothelium on his posterior and left bladder wall.  This was reminiscent of  recurrent CIS.  The patient comes to the operating room now for rebiopsy and  for bilateral retrogrades and renal washings.  He is aware of the risks and  complications and desires to proceed.   DESCRIPTION OF PROCEDURE:  Preoperative IV antibiotics were administered,  and the patient was taken to the operating room where a general anesthetic  was administered.  He was placed in the dorsal lithotomy position.  Genitalia and perineum was prepped and draped.  A 25 French panendoscope was  advanced through his urethra into his bladder.  Prostate was not obstructed.  Bladder was inspected circumferentially.  No tumors or foreign bodies were  noted.  There were two stellate scars in the posterior medial bladder wall.  Just inferior to those stellate scars, was an erythematous area and on the  left wall, there  was also some raised urothelium.  These areas were biopsied  x 3 and sent as bladder biopsies.  They were cauterized after this.  Bilateral ureteropyelograms were performed with an end-hole catheter.  They  were normal without evidence of filling defects.  Bilateral renal washings  were then taken separately and sent for cytologies.   At this point, the bladder was drained and the procedure terminated.  A B&O  suppository was placed in the patient's rectum.  He was awakened, taken to  the PACU in stable condition.                                               Bertram Millard. Dahlstedt, M.D.   SMD/MEDQ  D:  09/21/2002  T:  09/21/2002  Job:  [id number]

## 2011-04-22 HISTORY — PX: CARDIOVASCULAR STRESS TEST: SHX262

## 2011-05-24 ENCOUNTER — Telehealth: Payer: Self-pay | Admitting: Cardiology

## 2011-05-24 ENCOUNTER — Encounter: Payer: Self-pay | Admitting: Cardiology

## 2011-05-24 NOTE — Telephone Encounter (Signed)
Called to see if pt could be seen today or tomorrow for questionable elevated cardiac enzymes. Pt not having chest pain. Advised did not have physician here this afternoon and tomorrow was booked. Advised to call main [facility] number.

## 2011-05-24 NOTE — Telephone Encounter (Signed)
Called wanting to see if the patient could be seen some time this week because they had an unusual Bloodwork result. Please call back.

## 2011-05-30 ENCOUNTER — Institutional Professional Consult (permissible substitution): Payer: Self-pay | Admitting: Cardiology

## 2011-07-17 HISTORY — PX: LEFT HEART CATH AND CORONARY ANGIOGRAPHY: CATH118249

## 2011-07-17 HISTORY — PX: TRANSTHORACIC ECHOCARDIOGRAM: SHX275

## 2011-07-17 HISTORY — PX: CARDIAC CATHETERIZATION: SHX172

## 2011-08-03 LAB — POCT HEMOGLOBIN-HEMACUE
Hemoglobin: 17.7 — ABNORMAL HIGH
Operator id: 268271

## 2011-08-23 LAB — POCT HEMOGLOBIN-HEMACUE
Hemoglobin: 17.6 — ABNORMAL HIGH
Operator id: 134391

## 2013-04-01 ENCOUNTER — Other Ambulatory Visit: Payer: Self-pay | Admitting: Urology

## 2013-04-14 ENCOUNTER — Encounter (HOSPITAL_BASED_OUTPATIENT_CLINIC_OR_DEPARTMENT_OTHER): Payer: Self-pay | Admitting: *Deleted

## 2013-04-15 ENCOUNTER — Encounter (HOSPITAL_BASED_OUTPATIENT_CLINIC_OR_DEPARTMENT_OTHER): Payer: Self-pay | Admitting: *Deleted

## 2013-04-15 NOTE — Progress Notes (Addendum)
NPO AFTER MN. ARRIVES AT 0600. NEEDS ISTAT. CURRENT EKG, LOV NOTE, AND STRESS TEST TO BE FAXED FROM [facility] #[phone].  WILL TAKE COREG AND PRILOSEC AM OF SURG W/ SIP OF WATER. PT WILL BRING MED LIST DOS TO VERIFY SINCE HE WAS RECALLING FROM [facility]ORY TODAY. PT AWARE OWER AT MAIN.  REVIEWED CHART W/ DR DENNENY MDA, OK TO PROCEED.  No current ekg from cardiologist.  Will need ekg on arrival .

## 2013-04-20 ENCOUNTER — Emergency Department (HOSPITAL_COMMUNITY): Payer: Medicare Other

## 2013-04-20 ENCOUNTER — Inpatient Hospital Stay (HOSPITAL_COMMUNITY)
Admission: EM | Admit: 2013-04-20 | Discharge: 2013-04-23 | DRG: 690 | Disposition: A | Discharge status: Home / Self Care | Payer: Medicare Other | Attending: Internal Medicine | Admitting: Internal Medicine

## 2013-04-20 ENCOUNTER — Encounter (HOSPITAL_COMMUNITY): Payer: Self-pay | Admitting: Emergency Medicine

## 2013-04-20 DIAGNOSIS — A498 Other bacterial infections of unspecified site: Secondary | ICD-10-CM | POA: Diagnosis present

## 2013-04-20 DIAGNOSIS — K219 Gastro-esophageal reflux disease without esophagitis: Secondary | ICD-10-CM | POA: Diagnosis present

## 2013-04-20 DIAGNOSIS — I10 Essential (primary) hypertension: Secondary | ICD-10-CM | POA: Diagnosis present

## 2013-04-20 DIAGNOSIS — N210 Calculus in bladder: Secondary | ICD-10-CM | POA: Diagnosis present

## 2013-04-20 DIAGNOSIS — I252 Old myocardial infarction: Secondary | ICD-10-CM

## 2013-04-20 DIAGNOSIS — Z8551 Personal history of malignant neoplasm of bladder: Secondary | ICD-10-CM

## 2013-04-20 DIAGNOSIS — K5900 Constipation, unspecified: Secondary | ICD-10-CM | POA: Diagnosis present

## 2013-04-20 DIAGNOSIS — G4733 Obstructive sleep apnea (adult) (pediatric): Secondary | ICD-10-CM | POA: Diagnosis present

## 2013-04-20 DIAGNOSIS — E785 Hyperlipidemia, unspecified: Secondary | ICD-10-CM | POA: Diagnosis present

## 2013-04-20 DIAGNOSIS — A419 Sepsis, unspecified organism: Secondary | ICD-10-CM

## 2013-04-20 DIAGNOSIS — Z79899 Other long term (current) drug therapy: Secondary | ICD-10-CM

## 2013-04-20 DIAGNOSIS — I2510 Atherosclerotic heart disease of native coronary artery without angina pectoris: Secondary | ICD-10-CM | POA: Diagnosis present

## 2013-04-20 DIAGNOSIS — N390 Urinary tract infection, site not specified: Secondary | ICD-10-CM | POA: Diagnosis present

## 2013-04-20 DIAGNOSIS — N3090 Cystitis, unspecified without hematuria: Principal | ICD-10-CM | POA: Diagnosis present

## 2013-04-20 DIAGNOSIS — D72829 Elevated white blood cell count, unspecified: Secondary | ICD-10-CM | POA: Diagnosis present

## 2013-04-20 DIAGNOSIS — Z87891 Personal history of nicotine dependence: Secondary | ICD-10-CM

## 2013-04-20 LAB — CBC WITH DIFFERENTIAL/PLATELET
Basophils Absolute: 0 10*3/uL (ref 0.0–0.1)
Basophils Relative: 0 % (ref 0–1)
Eosinophils Absolute: 0.1 10*3/uL (ref 0.0–0.7)
Eosinophils Relative: 1 % (ref 0–5)
HCT: 39.7 % (ref 39.0–52.0)
Hemoglobin: 13.5 g/dL (ref 13.0–17.0)
Lymphocytes Relative: 7 % — ABNORMAL LOW (ref 12–46)
Lymphs Abs: 0.9 10*3/uL (ref 0.7–4.0)
MCH: 31 pg (ref 26.0–34.0)
MCHC: 34 g/dL (ref 30.0–36.0)
MCV: 91.1 fL (ref 78.0–100.0)
Monocytes Absolute: 1.3 10*3/uL — ABNORMAL HIGH (ref 0.1–1.0)
Monocytes Relative: 11 % (ref 3–12)
Neutro Abs: 9.7 10*3/uL — ABNORMAL HIGH (ref 1.7–7.7)
Neutrophils Relative %: 81 % — ABNORMAL HIGH (ref 43–77)
Platelets: 233 10*3/uL (ref 150–400)
RBC: 4.36 MIL/uL (ref 4.22–5.81)
RDW: 12.8 % (ref 11.5–15.5)
WBC: 12 10*3/uL — ABNORMAL HIGH (ref 4.0–10.5)

## 2013-04-20 LAB — URINALYSIS, ROUTINE W REFLEX MICROSCOPIC
Bilirubin Urine: NEGATIVE
Glucose, UA: NEGATIVE mg/dL
Ketones, ur: NEGATIVE mg/dL
Nitrite: POSITIVE — AB
Protein, ur: 100 mg/dL — AB
Specific Gravity, Urine: 1.017 (ref 1.005–1.030)
Urobilinogen, UA: 1 mg/dL (ref 0.0–1.0)
pH: 5.5 (ref 5.0–8.0)

## 2013-04-20 LAB — URINE MICROSCOPIC-ADD ON

## 2013-04-20 LAB — CG4 I-STAT (LACTIC ACID): Lactic Acid, Venous: 0.77 mmol/L (ref 0.5–2.2)

## 2013-04-20 LAB — BASIC METABOLIC PANEL
BUN: 17 mg/dL (ref 6–23)
CO2: 26 mEq/L (ref 19–32)
Calcium: 9 mg/dL (ref 8.4–10.5)
Chloride: 98 mEq/L (ref 96–112)
Creatinine, Ser: 1.16 mg/dL (ref 0.50–1.35)
GFR calc Af Amer: 71 mL/min — ABNORMAL LOW (ref >90)
GFR calc non Af Amer: 61 mL/min — ABNORMAL LOW (ref >90)
Glucose, Bld: 129 mg/dL — ABNORMAL HIGH (ref 70–99)
Potassium: 4.1 mEq/L (ref 3.5–5.1)
Sodium: 133 mEq/L — ABNORMAL LOW (ref 135–145)

## 2013-04-20 MED ORDER — ONDANSETRON HCL 4 MG/2ML IJ SOLN
4.0000 mg | Freq: Four times a day (QID) | INTRAMUSCULAR | Status: DC | PRN
  Administered 2013-04-20: 4 mg via INTRAVENOUS
  Filled 2013-04-20: qty 2

## 2013-04-20 MED ORDER — MORPHINE SULFATE 4 MG/ML IJ SOLN
4.0000 mg | INTRAMUSCULAR | Status: DC | PRN
  Administered 2013-04-22 – 2013-04-23 (×2): 4 mg via INTRAVENOUS
  Filled 2013-04-20 (×4): qty 1

## 2013-04-20 MED ORDER — MORPHINE SULFATE 4 MG/ML IJ SOLN
4.0000 mg | Freq: Once | INTRAMUSCULAR | Status: AC
  Administered 2013-04-20: 4 mg via INTRAVENOUS
  Filled 2013-04-20: qty 1

## 2013-04-20 MED ORDER — SODIUM CHLORIDE 0.9 % IV SOLN
Freq: Once | INTRAVENOUS | Status: AC
  Administered 2013-04-20: 23:00:00 via INTRAVENOUS

## 2013-04-20 MED ORDER — DEXTROSE 5 % IV SOLN
1.0000 g | Freq: Once | INTRAVENOUS | Status: AC
  Administered 2013-04-20: 1 g via INTRAVENOUS
  Filled 2013-04-20: qty 10

## 2013-04-20 NOTE — ED Provider Notes (Signed)
History     CSN: [id number]  Arrival date & time 04/20/13  2136   First MD Initiated Contact with Patient 04/20/13 2202      Chief Complaint  Patient presents with  . Nephrolithiasis    (Consider location/radiation/quality/duration/timing/severity/associated sxs/prior treatment) HPI Comments: 72 y/o comes in with cc of abdominal pain. Pt has hx of bladder stone, has a foley catheter placed, and is being managed by Dr. Retta Diones. Pt states that over the past couple of days, he has had increased pain, and today he started having some fevers, chills, nausea - and came to the ED. Pt also admits to dysuria and hematuria.   The history is provided by the patient and medical records.    Past Medical History  Diagnosis Date  . Hyperlipidemia   . Arthritis   . History of bladder cancer     CARCINOMA IN SITU OF BLADDER  . BPH (benign prostatic hypertrophy)   . Bladder stone   . Foley catheter in place   . Hypertension   . History of myocardial infarction     2011--  s/p  cardiac cath--  tx'd medically  . Coronary artery disease cardiologist--    ([facility])  . GERD (gastroesophageal reflux disease)   . OSA (obstructive sleep apnea)     cpap non-compliant     Past Surgical History  Procedure Laterality Date  . Cardiovascular stress test  04/22/11    EF 48%  . Lumbar decompression foraminotomy l3 -- l5/  removal cyst l4-5 facet joint  08-21-2009  . Cysto/ bladder bx's  X2  2001  / X1  2003  . Cysto/ bladder bx's/ bilateral retrograde ureteropylegram  2003; 2007; 2008;  12-30-2007    CARCINOMA IN SITU OF BLADDER  . Transurethral resection of bladder tumor  01-10-2004  . Cardiac catheterization  2011  [facility]    History reviewed. No pertinent family history.  History  Substance Use Topics  . Smoking status: Former Smoker -- 0.50 packs/day for 50 years    Types: Cigarettes    Quit date: 04/15/2010  . Smokeless tobacco: Current User   Types: Snuff     Comment: occasional  snuff pack  . Alcohol Use: Yes     Comment: social      Review of Systems  Constitutional: Positive for fever and chills. Negative for activity change.  HENT: Negative for neck pain.   Eyes: Negative for visual disturbance.  Respiratory: Negative for cough, chest tightness and shortness of breath.   Cardiovascular: Negative for chest pain.  Gastrointestinal: Positive for nausea and abdominal pain. Negative for vomiting and abdominal distention.  Genitourinary: Positive for dysuria and hematuria. Negative for flank pain, enuresis and difficulty urinating.  Musculoskeletal: Negative for arthralgias.  Neurological: Negative for dizziness, light-headedness and headaches.  Psychiatric/Behavioral: Negative for confusion.    Allergies  Augmentin and Sulfa antibiotics  Home Medications   Current Outpatient Rx  Name  Route  Sig  Dispense  Refill  . ALPRAZolam (XANAX) 1 MG tablet   Oral   Take 0.5-1 mg by mouth 3 (three) times daily as needed for anxiety.         Marland Kitchen atorvastatin (LIPITOR) 10 MG tablet   Oral   Take 10 mg by mouth at bedtime.         . carvedilol (COREG) 6.25 MG tablet   Oral   Take 6.25 mg by mouth 2 (two) times daily with a meal.         .  cholecalciferol (VITAMIN D) 400 UNITS TABS   Oral   Take 400 Units by mouth daily.         . ciprofloxacin (CIPRO) 250 MG tablet   Oral   Take 250 mg by mouth 2 (two) times daily. 10 day course of therapy started 04/20/13.         . diazepam (VALIUM) 5 MG tablet   Oral   Take 5 mg by mouth every 6 (six) hours as needed for anxiety (and muscle spasms).         . dutasteride (AVODART) 0.5 MG capsule   Oral   Take 0.5 mg by mouth daily.         Marland Kitchen escitalopram (LEXAPRO) 10 MG tablet   Oral   Take 10 mg by mouth daily.         . Meth-Hyo-M Bl-Na Phos-Ph Sal (URIBEL) 118 MG CAPS   Oral   Take 1 capsule by mouth every 6 (six) hours as needed (for burning/pain).           . Multiple Vitamin (MULTIVITAMIN WITH MINERALS) TABS   Oral   Take 1 tablet by mouth daily.         Marland Kitchen omeprazole (PRILOSEC) 20 MG capsule   Oral   Take 20 mg by mouth every morning.         Marland Kitchen oxybutynin (DITROPAN) 5 MG tablet   Oral   Take 5 mg by mouth 3 (three) times daily.         . silodosin (RAPAFLO) 8 MG CAPS capsule   Oral   Take 8 mg by mouth daily with breakfast.         . sodium phosphate Pediatric (FLEET) 3.5-9.5 GM/59ML enema   Rectal   Place 1 enema rectally once.         . meloxicam (MOBIC) 15 MG tablet   Oral   Take 15 mg by mouth every morning.           BP 148/77  Pulse 79  Temp(Src) 99.2 F (37.3 C) (Oral)  Resp 18  SpO2 97%  Physical Exam  Nursing note and vitals reviewed. Constitutional: He is oriented to person, place, and time. He appears well-developed.  HENT:  Head: Normocephalic and atraumatic.  Eyes: Conjunctivae and EOM are normal. Pupils are equal, round, and reactive to light.  Neck: Normal range of motion. Neck supple.  Cardiovascular: Normal rate and regular rhythm.   Pulmonary/Chest: Effort normal and breath sounds normal.  Abdominal: Soft. Bowel sounds are normal. He exhibits no distension. There is tenderness. There is no rebound and no guarding.  Neurological: He is alert and oriented to person, place, and time.  Skin: Skin is warm.    ED Course  Procedures (including critical care time)  Labs Reviewed  BASIC METABOLIC PANEL - Abnormal; Notable for the following:    Sodium 133 (*)    Glucose, Bld 129 (*)    GFR calc non Af Amer 61 (*)    GFR calc Af Amer 71 (*)    All other components within normal limits  CBC WITH DIFFERENTIAL - Abnormal; Notable for the following:    WBC 12.0 (*)    Neutrophils Relative % 81 (*)    Neutro Abs 9.7 (*)    Lymphocytes Relative 7 (*)    Monocytes Absolute 1.3 (*)    All other components within normal limits  URINALYSIS, ROUTINE W REFLEX MICROSCOPIC - Abnormal; Notable  for the following:  Color, Urine GREEN (*)    APPearance TURBID (*)    Hgb urine dipstick LARGE (*)    Protein, ur 100 (*)    Nitrite POSITIVE (*)    Leukocytes, UA LARGE (*)    All other components within normal limits  URINE MICROSCOPIC-ADD ON - Abnormal; Notable for the following:    Bacteria, UA MANY (*)    All other components within normal limits  URINE CULTURE   US Renal  04/20/2013   *RADIOLOGY REPORT*  Clinical Data: Bladder calculus.  RENAL/URINARY TRACT ULTRASOUND COMPLETE  Comparison:  CT of the abdomen and pelvis earlier today at [facility].  Findings:  Right Kidney:  13.7 cm in estimated length.  Normal sonographic appearance without evidence of hydronephrosis, shadowing calculus or focal lesion.  Left Kidney:  15 cm in estimated length.  Normal sonographic appearance without evidence of hydronephrosis, focal lesion or shadowing calculus.  Bladder:  The bladder is decompressed by a Foley catheter. Shadowing calcification in the bladder adjacent to the Foley catheter balloon likely represents the large calculus identified by CT in the bladder.  IMPRESSION: No evidence of hydronephrosis bilaterally.   Original Report Authenticated By: Irish Lack, M.D.     No diagnosis found.    MDM  Pt with known GU stones comes in with cc of nausea ,emesis, fevers, abd pain that is worse than normal. He is febrile - has mild leukocytosis, infected urine, and some systemic signs of infection - so we called Dr. Margarita Grizzle, Urology, who kindly reviewed with [place] patients condition and plan.  Pt has a stone in the bladder, no acute intervention needed. US renal shows no hydro, which i reassuring. He recommends admission to hosp with iv ab for the uti and also adding lactate to the lab work up. Pt to be seen by urology tomorrow.    Derwood Kaplan, MD 04/21/13 1610

## 2013-04-20 NOTE — ED Notes (Signed)
Patient states that he has a known kidney stone dx last night at [facility]. The patient is scheduled for lithotripsy

## 2013-04-21 ENCOUNTER — Encounter (HOSPITAL_BASED_OUTPATIENT_CLINIC_OR_DEPARTMENT_OTHER): Payer: Self-pay | Admitting: *Deleted

## 2013-04-21 DIAGNOSIS — N210 Calculus in bladder: Secondary | ICD-10-CM | POA: Diagnosis present

## 2013-04-21 DIAGNOSIS — N390 Urinary tract infection, site not specified: Secondary | ICD-10-CM | POA: Diagnosis present

## 2013-04-21 DIAGNOSIS — I10 Essential (primary) hypertension: Secondary | ICD-10-CM | POA: Diagnosis present

## 2013-04-21 LAB — BASIC METABOLIC PANEL
BUN: 15 mg/dL (ref 6–23)
CO2: 31 mEq/L (ref 19–32)
Calcium: 8.7 mg/dL (ref 8.4–10.5)
Chloride: 101 mEq/L (ref 96–112)
Creatinine, Ser: 1.21 mg/dL (ref 0.50–1.35)
GFR calc Af Amer: 67 mL/min — ABNORMAL LOW (ref >90)
GFR calc non Af Amer: 58 mL/min — ABNORMAL LOW (ref >90)
Glucose, Bld: 101 mg/dL — ABNORMAL HIGH (ref 70–99)
Potassium: 4.1 mEq/L (ref 3.5–5.1)
Sodium: 135 mEq/L (ref 135–145)

## 2013-04-21 LAB — CBC
HCT: 40.6 % (ref 39.0–52.0)
Hemoglobin: 13.8 g/dL (ref 13.0–17.0)
MCH: 31.4 pg (ref 26.0–34.0)
MCHC: 34 g/dL (ref 30.0–36.0)
MCV: 92.5 fL (ref 78.0–100.0)
Platelets: 227 10*3/uL (ref 150–400)
RBC: 4.39 MIL/uL (ref 4.22–5.81)
RDW: 12.9 % (ref 11.5–15.5)
WBC: 10.9 10*3/uL — ABNORMAL HIGH (ref 4.0–10.5)

## 2013-04-21 MED ORDER — POLYETHYLENE GLYCOL 3350 17 G PO PACK
17.0000 g | PACK | Freq: Two times a day (BID) | ORAL | Status: DC
  Administered 2013-04-21 – 2013-04-23 (×5): 17 g via ORAL
  Filled 2013-04-21 (×6): qty 1

## 2013-04-21 MED ORDER — ESCITALOPRAM OXALATE 10 MG PO TABS
10.0000 mg | ORAL_TABLET | Freq: Every day | ORAL | Status: DC
Start: 2013-04-21 — End: 2013-04-23
  Administered 2013-04-21 – 2013-04-23 (×3): 10 mg via ORAL
  Filled 2013-04-21 (×3): qty 1

## 2013-04-21 MED ORDER — BISACODYL 10 MG RE SUPP
10.0000 mg | Freq: Two times a day (BID) | RECTAL | Status: DC
  Administered 2013-04-21 – 2013-04-23 (×3): 10 mg via RECTAL
  Filled 2013-04-21 (×2): qty 1

## 2013-04-21 MED ORDER — BELLADONNA ALKALOIDS-OPIUM 16.2-60 MG RE SUPP
1.0000 | Freq: Four times a day (QID) | RECTAL | Status: DC | PRN
  Administered 2013-04-22: 1 via RECTAL
  Filled 2013-04-21: qty 1

## 2013-04-21 MED ORDER — DIAZEPAM 5 MG PO TABS
5.0000 mg | ORAL_TABLET | Freq: Four times a day (QID) | ORAL | Status: DC | PRN
  Administered 2013-04-22: 5 mg via ORAL
  Administered 2013-04-22: 2.5 mg via ORAL
  Filled 2013-04-21 (×2): qty 1

## 2013-04-21 MED ORDER — BISACODYL 10 MG RE SUPP
10.0000 mg | Freq: Every day | RECTAL | Status: DC
  Administered 2013-04-21: 10 mg via RECTAL
  Filled 2013-04-21 (×2): qty 1

## 2013-04-21 MED ORDER — HEPARIN SODIUM (PORCINE) 5000 UNIT/ML IJ SOLN
5000.0000 [IU] | Freq: Three times a day (TID) | INTRAMUSCULAR | Status: DC
  Administered 2013-04-21 – 2013-04-22 (×4): 5000 [IU] via SUBCUTANEOUS
  Filled 2013-04-21 (×7): qty 1

## 2013-04-21 MED ORDER — OXYBUTYNIN CHLORIDE 5 MG PO TABS
5.0000 mg | ORAL_TABLET | Freq: Three times a day (TID) | ORAL | Status: DC
  Administered 2013-04-21 – 2013-04-23 (×7): 5 mg via ORAL
  Filled 2013-04-21 (×9): qty 1

## 2013-04-21 MED ORDER — DOCUSATE SODIUM 100 MG PO CAPS
200.0000 mg | ORAL_CAPSULE | Freq: Two times a day (BID) | ORAL | Status: DC
  Administered 2013-04-21 – 2013-04-23 (×5): 200 mg via ORAL
  Filled 2013-04-21 (×6): qty 2

## 2013-04-21 MED ORDER — METHYLNALTREXONE BROMIDE 12 MG/0.6ML ~~LOC~~ SOLN: 12.0000 mg | Freq: Once | SUBCUTANEOUS | Status: DC

## 2013-04-21 MED ORDER — FLEET PEDIATRIC 3.5-9.5 GM/59ML RE ENEM
1.0000 | ENEMA | Freq: Once | RECTAL | Status: AC
  Administered 2013-04-21: 1 via RECTAL
  Filled 2013-04-21: qty 1

## 2013-04-21 MED ORDER — ATORVASTATIN CALCIUM 10 MG PO TABS
10.0000 mg | ORAL_TABLET | Freq: Every day | ORAL | Status: DC
  Administered 2013-04-21 – 2013-04-22 (×2): 10 mg via ORAL
  Filled 2013-04-21 (×3): qty 1

## 2013-04-21 MED ORDER — ALPRAZOLAM 0.5 MG PO TABS
0.5000 mg | ORAL_TABLET | Freq: Three times a day (TID) | ORAL | Status: DC | PRN
  Administered 2013-04-21: 1 mg via ORAL
  Administered 2013-04-21: 0.5 mg via ORAL
  Filled 2013-04-21: qty 2
  Filled 2013-04-21: qty 1

## 2013-04-21 MED ORDER — DEXTROSE 5 % IV SOLN
1.0000 g | INTRAVENOUS | Status: DC
  Administered 2013-04-21 – 2013-04-22 (×2): 1 g via INTRAVENOUS
  Filled 2013-04-21 (×3): qty 10

## 2013-04-21 MED ORDER — PANTOPRAZOLE SODIUM 40 MG PO TBEC
40.0000 mg | DELAYED_RELEASE_TABLET | Freq: Every day | ORAL | Status: DC
  Administered 2013-04-21 – 2013-04-23 (×3): 40 mg via ORAL
  Filled 2013-04-21 (×3): qty 1

## 2013-04-21 MED ORDER — TAMSULOSIN HCL 0.4 MG PO CAPS
0.4000 mg | ORAL_CAPSULE | Freq: Every day | ORAL | Status: DC
  Administered 2013-04-21 – 2013-04-23 (×3): 0.4 mg via ORAL
  Filled 2013-04-21 (×3): qty 1

## 2013-04-21 MED ORDER — BACIT-POLY-NEO HC 1 % EX OINT
TOPICAL_OINTMENT | Freq: Three times a day (TID) | CUTANEOUS | Status: DC
  Administered 2013-04-21 – 2013-04-22 (×6): via TOPICAL
  Filled 2013-04-21: qty 15

## 2013-04-21 MED ORDER — CHOLECALCIFEROL 10 MCG (400 UNIT) PO TABS
400.0000 [IU] | ORAL_TABLET | Freq: Every day | ORAL | Status: DC
  Administered 2013-04-22 – 2013-04-23 (×2): 400 [IU] via ORAL
  Filled 2013-04-21 (×3): qty 1

## 2013-04-21 MED ORDER — DUTASTERIDE 0.5 MG PO CAPS
0.5000 mg | ORAL_CAPSULE | Freq: Every day | ORAL | Status: DC
  Administered 2013-04-21 – 2013-04-23 (×3): 0.5 mg via ORAL
  Filled 2013-04-21 (×3): qty 1

## 2013-04-21 MED ORDER — MELOXICAM 15 MG PO TABS
15.0000 mg | ORAL_TABLET | Freq: Every morning | ORAL | Status: DC
  Administered 2013-04-22 – 2013-04-23 (×2): 15 mg via ORAL
  Filled 2013-04-21 (×3): qty 1

## 2013-04-21 MED ORDER — MAGNESIUM HYDROXIDE 400 MG/5ML PO SUSP
30.0000 mL | Freq: Every day | ORAL | Status: DC
  Administered 2013-04-21 – 2013-04-23 (×3): 30 mL via ORAL
  Filled 2013-04-21 (×3): qty 30

## 2013-04-21 MED ORDER — ADULT MULTIVITAMIN W/MINERALS CH
1.0000 | ORAL_TABLET | Freq: Every day | ORAL | Status: DC
  Administered 2013-04-22 – 2013-04-23 (×2): 1 via ORAL
  Filled 2013-04-21 (×3): qty 1

## 2013-04-21 MED ORDER — CARVEDILOL 6.25 MG PO TABS
6.2500 mg | ORAL_TABLET | Freq: Two times a day (BID) | ORAL | Status: DC
  Administered 2013-04-21 – 2013-04-23 (×5): 6.25 mg via ORAL
  Filled 2013-04-21 (×7): qty 1

## 2013-04-21 NOTE — Progress Notes (Signed)
[facility]                                                                                Patient Demographics  Anthony Hartman, is a 72 y.o. male, DOB - [birth year], RUE:[id number], JYN:[id number]  Admit date - 04/20/2013  Admitting Physician Hillary Bow, DO  Outpatient Primary MD for the patient is [name], MD  LOS - 1   Chief Complaint  Patient presents with  . Nephrolithiasis        Assessment & Plan    1. Cystitis /  Complicated UTI (urinary tract infection) -  agent with no history of BPH and bladder stone who was scheduled for an outpatient surgery by Dr. Retta Diones on 04/23/2013, continue empiric antibiotic, follow urine cultures, follow CBC and BMP with gentle IV fluids. Urology is following.   2. Obstipation. Bowel regimen initiated. KUB stable.   3. Hypertension continue him on Coreg.   4. Dyslipidemia continue home dose statin    Code Status: Full  Family Communication:  None prsent  Disposition Plan: home   Procedures Renal US   Consults  Urology   DVT Prophylaxis    Heparin   Lab Results  Component Value Date   PLT 227 04/21/2013    Medications  Scheduled Meds: . atorvastatin  10 mg Oral QHS  . bacitracin-neomycin-polymyxin-hydrocortisone   Topical TID  . bisacodyl  10 mg Rectal BID  . bisacodyl  10 mg Rectal Daily  . carvedilol  6.25 mg Oral BID WC  . cefTRIAXone (ROCEPHIN)  IV  1 g Intravenous Q24H  . cholecalciferol  400 Units Oral Daily  . docusate sodium  200 mg Oral BID  . dutasteride  0.5 mg Oral Daily  . escitalopram  10 mg Oral Daily  . heparin  5,000 Units Subcutaneous Q8H  . magnesium hydroxide  30 mL Oral Daily  . meloxicam  15 mg Oral q morning - 10a  . methylnaltrexone  12 mg Subcutaneous Once  . multivitamin with minerals  1 tablet Oral Daily  . oxybutynin  5 mg Oral TID  . pantoprazole  40 mg Oral Daily  . polyethylene glycol  17 g Oral BID  . tamsulosin  0.4 mg Oral Daily   Continuous Infusions:   PRN Meds:.ALPRAZolam, diazepam, morphine injection, ondansetron, opium-belladonna  Antibiotics    Anti-infectives   Start     Dose/Rate Route Frequency Ordered Stop   04/21/13 2300  cefTRIAXone (ROCEPHIN) 1 g in dextrose 5 % 50 mL IVPB     1 g 100 mL/hr over 30 Minutes Intravenous Every 24 hours 04/21/13 0133     04/20/13 2330  cefTRIAXone (ROCEPHIN) 1 g in dextrose 5 % 50 mL IVPB     1 g 100 mL/hr over 30 Minutes Intravenous  Once 04/20/13 2322 04/21/13 0125       Time Spent in minutes   35   Thomos Domine K M.D on 04/21/2013 at 8:49 AM  Between 7am to 7pm - Pager - [phone]  After 7pm go to [email or web address] - password [email or web address]  And look for the night coverage person covering for me after hours  [facility] Office  [phone]  Subjective:   [name] today has, No headache, No chest pain, No abdominal pain - No Nausea, No new weakness tingling or numbness, No Cough - SOB.  Objective:   Filed Vitals:   04/20/13 2154 04/21/13 0209 04/21/13 0525 04/21/13 0835  BP: 148/77 164/78 144/73 129/82  Pulse: 79 76 71 76  Temp: 99.2 F (37.3 C) 98.2 F (36.8 C) 98.7 F (37.1 C)   TempSrc: Oral Oral Oral   Resp: 18 18 18    SpO2: 97% 93% 97%     Wt Readings from Last 3 Encounters:  04/15/13 105.688 kg (233 lb)     Intake/Output Summary (Last 24 hours) at 04/21/13 0849 Last data filed at 04/21/13 0525  Gross per 24 hour  Intake      0 ml  Output   1050 ml  Net  -1050 ml    Exam Awake Alert, Oriented X 3, No new F.N deficits, Normal affect [place].AT,PERRAL Supple Neck,No JVD, No cervical lymphadenopathy appriciated.  Symmetrical Chest wall movement, Good air movement bilaterally, CTAB RRR,No Gallops,Rubs or new Murmurs, No Parasternal Heave +ve B.Sounds, Abd Soft, Non tender, No organomegaly appriciated, No rebound - guarding or rigidity. No Cyanosis, Clubbing or edema, No new Rash or bruise   Data Review   Micro Results No results found for  this or any previous visit (from the past 240 hour(s)).  Radiology Reports US Renal  04/20/2013   *RADIOLOGY REPORT*  Clinical Data: Bladder calculus.  RENAL/URINARY TRACT ULTRASOUND COMPLETE  Comparison:  CT of the abdomen and pelvis earlier today at [facility].  Findings:  Right Kidney:  13.7 cm in estimated length.  Normal sonographic appearance without evidence of hydronephrosis, shadowing calculus or focal lesion.  Left Kidney:  15 cm in estimated length.  Normal sonographic appearance without evidence of hydronephrosis, focal lesion or shadowing calculus.  Bladder:  The bladder is decompressed by a Foley catheter. Shadowing calcification in the bladder adjacent to the Foley catheter balloon likely represents the large calculus identified by CT in the bladder.  IMPRESSION: No evidence of hydronephrosis bilaterally.   Original Report Authenticated By: Irish Lack, M.D.   Dg Abd Acute W/chest  04/21/2013   *RADIOLOGY REPORT*  Clinical Data: Abdominal pain and constipation.  Nephrolithiasis.  ACUTE ABDOMEN SERIES (ABDOMEN 2 VIEW & CHEST 1 VIEW)  Comparison: Chest 04/22/2011  Findings: Normal heart size and pulmonary vascularity. Emphysematous changes scattered fibrosis in the lungs.  Linear fibrosis in the lung bases.  Scarring in the apices.  No focal airspace disease or consolidation in the lungs.  No blunting of costophrenic angles.  No pneumothorax.  Calcification of the aorta. No significant change since previous chest.  Gas and stool throughout the colon.  No small or large bowel distension.  No free intra-abdominal air.  No abnormal air fluid levels.  There is a calcification in the pelvis measuring 2.3 cm consistent with bladder stone is seen on previous CT 04/20/2013. Calcified phleboliths in the pelvis.  No definite renal stones although a large amount of stool in the transverse colon may obscure visualization of renal stones.  Vascular calcifications. Degenerative changes in the spine.   IMPRESSION: Emphysematous changes and fibrosis in the lungs.   Bladder stone. Stool filled colon.  Nonobstructive bowel gas pattern.   Original Report Authenticated By: Burman Nieves, M.D.    CBC  Recent Labs Lab 04/20/13 2220 04/21/13 0515  WBC 12.0* 10.9*  HGB 13.5 13.8  HCT 39.7 40.6  PLT 233 227  MCV  91.1 92.5  MCH 31.0 31.4  MCHC 34.0 34.0  RDW 12.8 12.9  LYMPHSABS 0.9  --   MONOABS 1.3*  --   EOSABS 0.1  --   BASOSABS 0.0  --     Chemistries   Recent Labs Lab 04/20/13 2220 04/21/13 0515  NA 133* 135  K 4.1 4.1  CL 98 101  CO2 26 31  GLUCOSE 129* 101*  BUN 17 15  CREATININE 1.16 1.21  CALCIUM 9.0 8.7   ------------------------------------------------------------------------------------------------------------------ CrCl is unknown because both a height and weight (above a minimum accepted value) are required for this calculation. ------------------------------------------------------------------------------------------------------------------ No results found for this basename: HGBA1C,  in the last 72 hours ------------------------------------------------------------------------------------------------------------------ No results found for this basename: CHOL, HDL, LDLCALC, TRIG, CHOLHDL, LDLDIRECT,  in the last 72 hours ------------------------------------------------------------------------------------------------------------------ No results found for this basename: TSH, T4TOTAL, FREET3, T3FREE, THYROIDAB,  in the last 72 hours ------------------------------------------------------------------------------------------------------------------ No results found for this basename: VITAMINB12, FOLATE, FERRITIN, TIBC, IRON, RETICCTPCT,  in the last 72 hours  Coagulation profile No results found for this basename: INR, PROTIME,  in the last 168 hours  No results found for this basename: DDIMER,  in the last 72 hours  Cardiac Enzymes No results found for this  basename: CK, CKMB, TROPONINI, MYOGLOBIN,  in the last 168 hours ------------------------------------------------------------------------------------------------------------------ No components found with this basename: POCBNP,

## 2013-04-21 NOTE — Consult Note (Signed)
Urology Consult  Requesting provider:  Dr. Rhunette Croft  CC: Bladder stone. Fever  HPI: 71 year old male presented to the ER with fever. This was up to 101 per the patient. This is associated with intermittent gross hematuria. Fever began last night. He has a known history of enlarged prostate and a bladder stone which is been quite symptomatic. He had a Foley catheter placed by Dr. Retta Diones on 04/13/13. He had an ultrasound of the kidneys in the ER which revealed no hydronephrosis bilaterally. A KUB revealed no stones in the kidneys or ureters, but did show the stone in the bladder. He scheduled for surgery on 04/23/13 with Dr. Retta Diones. He was admitted to the hospital last night due to the fever and leukocytosis. Since his admission, he has not had any fevers. Urine cultures pending. We discussed that he could possibly have urinary tract infection. Urine cultures are pending. I do not recommend changing the Foley catheter at this time because the stone and likely harbored bacteria and this would not change the course of his treatment. He is currently receiving IV Rocephin.  PMH: Past Medical History  Diagnosis Date  . Hyperlipidemia   . Arthritis   . History of bladder cancer     CARCINOMA IN SITU OF BLADDER  . BPH (benign prostatic hypertrophy)   . Bladder stone   . Foley catheter in place   . Hypertension   . History of myocardial infarction     2011--  s/p  cardiac cath--  tx'd medically  . Coronary artery disease cardiologist--    ([facility])  . GERD (gastroesophageal reflux disease)   . OSA (obstructive sleep apnea)     cpap non-compliant     PSH: Past Surgical History  Procedure Laterality Date  . Cardiovascular stress test  04/22/11    EF 48%  . Lumbar decompression foraminotomy l3 -- l5/  removal cyst l4-5 facet joint  08-21-2009  . Cysto/ bladder bx's  X2  2001  / X1  2003  . Cysto/ bladder bx's/ bilateral retrograde ureteropylegram  2003; 2007; 2008;   12-30-2007    CARCINOMA IN SITU OF BLADDER  . Transurethral resection of bladder tumor  01-10-2004  . Cardiac catheterization  2011  [facility]    Allergies: Allergies  Allergen Reactions  . Augmentin (Amoxicillin-Pot Clavulanate) Nausea And Vomiting  . Sulfa Antibiotics Nausea And Vomiting    Medications: Prescriptions prior to admission  Medication Sig Dispense Refill  . ALPRAZolam (XANAX) 1 MG tablet Take 0.5-1 mg by mouth 3 (three) times daily as needed for anxiety.      Marland Kitchen atorvastatin (LIPITOR) 10 MG tablet Take 10 mg by mouth at bedtime.      . carvedilol (COREG) 6.25 MG tablet Take 6.25 mg by mouth 2 (two) times daily with a meal.      . cholecalciferol (VITAMIN D) 400 UNITS TABS Take 400 Units by mouth daily.      . ciprofloxacin (CIPRO) 250 MG tablet Take 250 mg by mouth 2 (two) times daily. 10 day course of therapy started 04/20/13.      . diazepam (VALIUM) 5 MG tablet Take 5 mg by mouth every 6 (six) hours as needed for anxiety (and muscle spasms).      . dutasteride (AVODART) 0.5 MG capsule Take 0.5 mg by mouth daily.      Marland Kitchen escitalopram (LEXAPRO) 10 MG tablet Take 10 mg by mouth daily.      . Meth-Hyo-M Salley Hews  Phos-Ph Sal (URIBEL) 118 MG CAPS Take 1 capsule by mouth every 6 (six) hours as needed (for burning/pain).       . Multiple Vitamin (MULTIVITAMIN WITH MINERALS) TABS Take 1 tablet by mouth daily.      Marland Kitchen omeprazole (PRILOSEC) 20 MG capsule Take 20 mg by mouth every morning.      Marland Kitchen oxybutynin (DITROPAN) 5 MG tablet Take 5 mg by mouth 3 (three) times daily.      . silodosin (RAPAFLO) 8 MG CAPS capsule Take 8 mg by mouth daily with breakfast.      . sodium phosphate Pediatric (FLEET) 3.5-9.5 GM/59ML enema Place 1 enema rectally once.      . meloxicam (MOBIC) 15 MG tablet Take 15 mg by mouth every morning.         Social History: History   Social History  . Marital Status: Married    Spouse Name: N/A    Number of Children: N/A  . Years of Education: N/A    Occupational History  . Not on file.   Social History Main Topics  . Smoking status: Former Smoker -- 0.50 packs/day for 50 years    Types: Cigarettes    Quit date: 04/15/2010  . Smokeless tobacco: Current User    Types: Snuff     Comment: occasional  snuff pack  . Alcohol Use: Yes     Comment: social  . Drug Use: No  . Sexually Active: Not on file   Other Topics Concern  . Not on file   Social History Narrative  . No narrative on file    Family History: History reviewed. No pertinent family history.  Review of Systems: Positive: Constipation, dry mouth. Negative: Chest pain, nausea, or SOB.  A further 10 point review of systems was negative except what is listed in the HPI.  Physical Exam: Filed Vitals:   04/21/13 0525  BP: 144/73  Pulse: 71  Temp: 98.7 F (37.1 C)  Resp: 18    General: No acute distress.  Awake. Head:  Normocephalic.  Atraumatic. ENT:  EOMI.  Mucous membranes moist Neck:  Supple.  No lymphadenopathy. CV:  S1 present. S2 present. Regular rate. Pulmonary: Equal effort bilaterally.  Clear to auscultation bilaterally. Abdomen: Soft.  Non- tender to palpation. Skin:  Normal turgor.  No visible rash. Extremity: No gross deformity of bilateral upper extremities.  No gross deformity of    bilateral lower extremities. Neurologic: Alert. Appropriate mood.  Penis:  Circumcised.  No lesions. Urethra: Foley catheter in place.  Orthotopic meatus. Erythema at meatus. Scrotum: No lesions.  No ecchymosis.  No erythema.   Studies:  Recent Labs     04/20/13  2220  04/21/13  0515  HGB  13.5  13.8  WBC  12.0*  10.9*  PLT  233  227    Recent Labs     04/20/13  2220  04/21/13  0515  NA  133*  135  K  4.1  4.1  CL  98  101  CO2  26  31  BUN  17  15  CREATININE  1.16  1.21  CALCIUM  9.0  8.7  GFRNONAA  61*  58*  GFRAA  71*  67*     No results found for this basename: PT, INR, APTT,  in the last 72 hours   No components found with this  basename: ABG,     Assessment:  Bladder stone. Fever.  Plan: Continue antibiotics.  Follow results from  urine culture.  I added B&O suppositories for bladder spasms, cortisporin ointment for meatal irritation, dulcolax suppositories for constipation.  Reassess patient tomorrow and if he is doing well at could be possible to discharge him home before his surgery on Thursday. I will notify Dr. Retta Diones of the patient's presence in the hospital. I will have him see the patient tomorrow to reassess.    Pager: [phone]    CC: Dr. Rhunette Croft

## 2013-04-21 NOTE — H&P (Signed)
[facility] History and Physical  Anthony Hartman AOZ:[id number] DOB: Feb 08, 1941 DOA: 04/20/2013  Referring physician: ED PCP: [name], MD  Specialists: [facility]  Chief Complaint: Dysuria, fever  HPI: Anthony Hartman is a 72 y.o. male with urinary bladder stone, h/o bladder cancer who is scheduled to undergo lithotripsy for the stone which has been causing bladder spasm as well as prostate reduction in 2 days.  He had a foley catheter recently placed by Dr. Retta Diones in the office.  Unfortunately he now presents to the ED with a couple day history of worsening lower abdominal pain, dysuria, fevers up to 100.8 at home, chills, nausea, and hematuria.  He notes his urine has developed a foul odor and is concerned that it may be infected.  In the ED he is noted to have temperature of 99.2, WBC of 12.0, and is indeed confirmed to have a UTI.  Urology has been consulted and per EDP is now considering taking out the stone sooner as it is likely now colonized and they will see him tomorrow, hospitalist has been asked to admit.  Review of Systems: 12 systems reviewed and otherwise negative.  Past Medical History  Diagnosis Date  . Hyperlipidemia   . Arthritis   . History of bladder cancer     CARCINOMA IN SITU OF BLADDER  . BPH (benign prostatic hypertrophy)   . Bladder stone   . Foley catheter in place   . Hypertension   . History of myocardial infarction     2011--  s/p  cardiac cath--  tx'd medically  . Coronary artery disease cardiologist--    ([facility])  . GERD (gastroesophageal reflux disease)   . OSA (obstructive sleep apnea)     cpap non-compliant    Past Surgical History  Procedure Laterality Date  . Cardiovascular stress test  04/22/11    EF 48%  . Lumbar decompression foraminotomy l3 -- l5/  removal cyst l4-5 facet joint  08-21-2009  . Cysto/ bladder bx's  X2  2001  / X1  2003  . Cysto/ bladder bx's/ bilateral retrograde ureteropylegram   2003; 2007; 2008;  12-30-2007    CARCINOMA IN SITU OF BLADDER  . Transurethral resection of bladder tumor  01-10-2004  . Cardiac catheterization  2011  [facility]   Social History:  reports that he quit smoking about 3 years ago. His smoking use included Cigarettes. He has a 25 pack-year smoking history. His smokeless tobacco use includes Snuff. He reports that  drinks alcohol. He reports that he does not use illicit drugs.   Allergies  Allergen Reactions  . Augmentin (Amoxicillin-Pot Clavulanate) Nausea And Vomiting  . Sulfa Antibiotics Nausea And Vomiting    History reviewed. No pertinent family history.  Prior to Admission medications   Medication Sig Start Date End Date Taking? Authorizing Provider  ALPRAZolam Prudy Feeler) 1 MG tablet Take 0.5-1 mg by mouth 3 (three) times daily as needed for anxiety.   Yes Historical Provider, MD  atorvastatin (LIPITOR) 10 MG tablet Take 10 mg by mouth at bedtime.   Yes Historical Provider, MD  carvedilol (COREG) 6.25 MG tablet Take 6.25 mg by mouth 2 (two) times daily with a meal.   Yes Historical Provider, MD  cholecalciferol (VITAMIN D) 400 UNITS TABS Take 400 Units by mouth daily.   Yes Historical Provider, MD  ciprofloxacin (CIPRO) 250 MG tablet Take 250 mg by mouth 2 (two) times daily. 10 day course of therapy started 04/20/13.  Yes Historical Provider, MD  diazepam (VALIUM) 5 MG tablet Take 5 mg by mouth every 6 (six) hours as needed for anxiety (and muscle spasms).   Yes Historical Provider, MD  dutasteride (AVODART) 0.5 MG capsule Take 0.5 mg by mouth daily.   Yes Historical Provider, MD  escitalopram (LEXAPRO) 10 MG tablet Take 10 mg by mouth daily.   Yes Historical Provider, MD  Meth-Hyo-M Bl-Na Phos-Ph Sal (URIBEL) 118 MG CAPS Take 1 capsule by mouth every 6 (six) hours as needed (for burning/pain).    Yes Historical Provider, MD  Multiple Vitamin (MULTIVITAMIN WITH MINERALS) TABS Take 1 tablet by mouth daily.   Yes Historical Provider,  MD  omeprazole (PRILOSEC) 20 MG capsule Take 20 mg by mouth every morning.   Yes Historical Provider, MD  oxybutynin (DITROPAN) 5 MG tablet Take 5 mg by mouth 3 (three) times daily.   Yes Historical Provider, MD  silodosin (RAPAFLO) 8 MG CAPS capsule Take 8 mg by mouth daily with breakfast.   Yes Historical Provider, MD  sodium phosphate Pediatric (FLEET) 3.5-9.5 GM/59ML enema Place 1 enema rectally once.   Yes Historical Provider, MD  meloxicam (MOBIC) 15 MG tablet Take 15 mg by mouth every morning.    Historical Provider, MD   Physical Exam: Filed Vitals:   04/20/13 2154  BP: 148/77  Pulse: 79  Temp: 99.2 F (37.3 C)  TempSrc: Oral  Resp: 18  SpO2: 97%    General:  NAD, resting comfortably in bed Eyes: PEERLA EOMI ENT: mucous membranes moist Neck: supple w/o JVD Cardiovascular: RRR w/o MRG Respiratory: CTA B Abdomen: soft, tender, nd, bs+ Skin: no rash nor lesion Musculoskeletal: MAE, full ROM all 4 extremities Psychiatric: normal tone and affect Neurologic: AAOx3, grossly non-focal  Labs on Admission:  Basic Metabolic Panel:  Recent Labs Lab 04/20/13 2220  NA 133*  K 4.1  CL 98  CO2 26  GLUCOSE 129*  BUN 17  CREATININE 1.16  CALCIUM 9.0   Liver Function Tests: No results found for this basename: AST, ALT, ALKPHOS, BILITOT, PROT, ALBUMIN,  in the last 168 hours No results found for this basename: LIPASE, AMYLASE,  in the last 168 hours No results found for this basename: AMMONIA,  in the last 168 hours CBC:  Recent Labs Lab 04/20/13 2220  WBC 12.0*  NEUTROABS 9.7*  HGB 13.5  HCT 39.7  MCV 91.1  PLT 233   Cardiac Enzymes: No results found for this basename: CKTOTAL, CKMB, CKMBINDEX, TROPONINI,  in the last 168 hours  BNP (last 3 results) No results found for this basename: PROBNP,  in the last 8760 hours CBG: No results found for this basename: GLUCAP,  in the last 168 hours  Radiological Exams on Admission: US Renal  04/20/2013   *RADIOLOGY  REPORT*  Clinical Data: Bladder calculus.  RENAL/URINARY TRACT ULTRASOUND COMPLETE  Comparison:  CT of the abdomen and pelvis earlier today at [facility].  Findings:  Right Kidney:  13.7 cm in estimated length.  Normal sonographic appearance without evidence of hydronephrosis, shadowing calculus or focal lesion.  Left Kidney:  15 cm in estimated length.  Normal sonographic appearance without evidence of hydronephrosis, focal lesion or shadowing calculus.  Bladder:  The bladder is decompressed by a Foley catheter. Shadowing calcification in the bladder adjacent to the Foley catheter balloon likely represents the large calculus identified by CT in the bladder.  IMPRESSION: No evidence of hydronephrosis bilaterally.   Original Report Authenticated By: Irish Lack, M.D.  Dg Abd Acute W/chest  04/21/2013   *RADIOLOGY REPORT*  Clinical Data: Abdominal pain and constipation.  Nephrolithiasis.  ACUTE ABDOMEN SERIES (ABDOMEN 2 VIEW & CHEST 1 VIEW)  Comparison: Chest 04/22/2011  Findings: Normal heart size and pulmonary vascularity. Emphysematous changes scattered fibrosis in the lungs.  Linear fibrosis in the lung bases.  Scarring in the apices.  No focal airspace disease or consolidation in the lungs.  No blunting of costophrenic angles.  No pneumothorax.  Calcification of the aorta. No significant change since previous chest.  Gas and stool throughout the colon.  No small or large bowel distension.  No free intra-abdominal air.  No abnormal air fluid levels.  There is a calcification in the pelvis measuring 2.3 cm consistent with bladder stone is seen on previous CT 04/20/2013. Calcified phleboliths in the pelvis.  No definite renal stones although a large amount of stool in the transverse colon may obscure visualization of renal stones.  Vascular calcifications. Degenerative changes in the spine.  IMPRESSION: Emphysematous changes and fibrosis in the lungs.   Bladder stone. Stool filled colon.   Nonobstructive bowel gas pattern.   Original Report Authenticated By: Burman Nieves, M.D.    EKG: Independently reviewed.  Assessment/Plan Principal Problem:   Complicated UTI (urinary tract infection) Active Problems:   HTN (hypertension)   Urinary bladder stone   1. UTI - complicated by presence of catheter and bladder stone - urology to evaluate tomorrow, was scheduled for lithotripsy and prostate reduction on Thursday.  Rocephin ordered for now, likely needs foley replaced by urology tomorrow (holding off for now since it was a very difficult insertion per patient and family). 2. HTN - continue home meds    Code Status: Full Code (must indicate code status--if unknown or must be presumed, indicate so) Family Communication:  Spoke with family at bedside (indicate person spoken with, if applicable, with phone number if by telephone) Disposition Plan: Admit to obs (indicate anticipated LOS)  Time spent: 50 min  GARDNER, JARED M. [facility] Pager [phone]  If 7PM-7AM, please contact night-coverage [email or web address] Password [email or web address] 04/21/2013, 1:34 AM

## 2013-04-22 LAB — CBC
HCT: 39.9 % (ref 39.0–52.0)
Hemoglobin: 13.4 g/dL (ref 13.0–17.0)
MCH: 30.7 pg (ref 26.0–34.0)
MCHC: 33.6 g/dL (ref 30.0–36.0)
MCV: 91.5 fL (ref 78.0–100.0)
Platelets: 229 10*3/uL (ref 150–400)
RBC: 4.36 MIL/uL (ref 4.22–5.81)
RDW: 12.8 % (ref 11.5–15.5)
WBC: 7.3 10*3/uL (ref 4.0–10.5)

## 2013-04-22 LAB — BASIC METABOLIC PANEL
BUN: 14 mg/dL (ref 6–23)
CO2: 28 mEq/L (ref 19–32)
Calcium: 8.8 mg/dL (ref 8.4–10.5)
Chloride: 99 mEq/L (ref 96–112)
Creatinine, Ser: 0.97 mg/dL (ref 0.50–1.35)
GFR calc Af Amer: >90 mL/min (ref >90)
GFR calc non Af Amer: 81 mL/min — ABNORMAL LOW (ref >90)
Glucose, Bld: 98 mg/dL (ref 70–99)
Potassium: 4.2 mEq/L (ref 3.5–5.1)
Sodium: 133 mEq/L — ABNORMAL LOW (ref 135–145)

## 2013-04-22 LAB — URINE CULTURE: Colony Count: >=100000

## 2013-04-22 MED ORDER — MINERAL OIL RE ENEM
1.0000 | ENEMA | Freq: Once | RECTAL | Status: AC
  Administered 2013-04-22: 1 via RECTAL
  Filled 2013-04-22: qty 1

## 2013-04-22 MED ORDER — WITCH HAZEL-GLYCERIN EX PADS
MEDICATED_PAD | CUTANEOUS | Status: DC | PRN
  Filled 2013-04-22: qty 100

## 2013-04-22 MED ORDER — HYDROCORTISONE 2.5 % RE CREA
TOPICAL_CREAM | Freq: Four times a day (QID) | RECTAL | Status: DC
  Administered 2013-04-22 (×3): via RECTAL
  Filled 2013-04-22: qty 28.35

## 2013-04-22 NOTE — Progress Notes (Signed)
[facility] PROGRESS NOTE  CLEARANCE CHENAULT AOZ:[id number] DOB: September 10, 1941 DOA: 04/20/2013 PCP: [name], MD  Assessment/Plan:  1. Cystitis / Complicated UTI (urinary tract infection) - Patient with  history of BPH and bladder stone who was scheduled for an outpatient surgery by Dr. Retta Diones on 04/23/2013.  -continue with Ceftriaxone day 2. urine cultures grew E. Coli, sensitivity pending.   -per patient Dr Normajean Baxter has cancel procedure.  -Continue wit B and O suppository for bladder spasm.   2.Constipation: had very small BM. I will order fleet enema. Continue with laxatives.  3. Hypertension continue with Coreg.  4. Dyslipidemia continue with statin  5.Hemorrhoids. Continue with anusol creaml. No more bleeding today. Hold heparin for DVT prophylaxis.  6.DVT prophylaxis: SCD.    Code Status: Full  Family Communication: Care discussed with wife who was at bedside.  Disposition Plan: home when stable   Consultants:  Urology  Procedures: Renal US; No evidence of hydronephrosis bilaterally.    Antibiotics:  Ceftriaxone 6-10  HPI/Subjective: Had small amount of blood in the stool yesterday after straining.  Had very small BM.  Still with bladder spasm.     Objective: Filed Vitals:   04/21/13 0835 04/21/13 1356 04/21/13 2201 04/22/13 0600  BP: 129/82 126/60 133/72 139/67  Pulse: 76 66 61 60  Temp:  98.2 F (36.8 C) 98.6 F (37 C) 98.7 F (37.1 C)  TempSrc:  Oral Oral Oral  Resp:  18 18 18   SpO2:  98% 97% 95%    Intake/Output Summary (Last 24 hours) at 04/22/13 1216 Last data filed at 04/22/13 0700  Gross per 24 hour  Intake    360 ml  Output   1800 ml  Net  -1440 ml   There were no vitals filed for this visit.  Exam:   General:  No distress.   Cardiovascular: S 1, S 2 RRR  Respiratory: CTA  Abdomen: BS present, soft, NT  Musculoskeletal: no edema.   Data Reviewed: Basic Metabolic Panel:  Recent Labs Lab 04/20/13 2220 04/21/13 0515  04/22/13 0500  NA 133* 135 133*  K 4.1 4.1 4.2  CL 98 101 99  CO2 26 31 28   GLUCOSE 129* 101* 98  BUN 17 15 14   CREATININE 1.16 1.21 0.97  CALCIUM 9.0 8.7 8.8   Liver Function Tests: No results found for this basename: AST, ALT, ALKPHOS, BILITOT, PROT, ALBUMIN,  in the last 168 hours No results found for this basename: LIPASE, AMYLASE,  in the last 168 hours No results found for this basename: AMMONIA,  in the last 168 hours CBC:  Recent Labs Lab 04/20/13 2220 04/21/13 0515 04/22/13 0500  WBC 12.0* 10.9* 7.3  NEUTROABS 9.7*  --   --   HGB 13.5 13.8 13.4  HCT 39.7 40.6 39.9  MCV 91.1 92.5 91.5  PLT 233 227 229   Cardiac Enzymes: No results found for this basename: CKTOTAL, CKMB, CKMBINDEX, TROPONINI,  in the last 168 hours BNP (last 3 results) No results found for this basename: PROBNP,  in the last 8760 hours CBG: No results found for this basename: GLUCAP,  in the last 168 hours  Recent Results (from the past 240 hour(s))  URINE CULTURE     Status: None   Collection Time    04/20/13 10:33 PM      Result Value Range Status   Specimen Description URINE, CATHETERIZED   Final   Special Requests NONE   Final   Culture  Setup Time 04/21/2013 03:46  Final   Colony Count >=100,000 COLONIES/ML   Final   Culture ESCHERICHIA COLI   Final   Report Status PENDING   Incomplete     Studies: US Renal  04/20/2013   *RADIOLOGY REPORT*  Clinical Data: Bladder calculus.  RENAL/URINARY TRACT ULTRASOUND COMPLETE  Comparison:  CT of the abdomen and pelvis earlier today at [facility].  Findings:  Right Kidney:  13.7 cm in estimated length.  Normal sonographic appearance without evidence of hydronephrosis, shadowing calculus or focal lesion.  Left Kidney:  15 cm in estimated length.  Normal sonographic appearance without evidence of hydronephrosis, focal lesion or shadowing calculus.  Bladder:  The bladder is decompressed by a Foley catheter. Shadowing calcification in the bladder  adjacent to the Foley catheter balloon likely represents the large calculus identified by CT in the bladder.  IMPRESSION: No evidence of hydronephrosis bilaterally.   Original Report Authenticated By: Irish Lack, M.D.   Dg Abd Acute W/chest  04/21/2013   *RADIOLOGY REPORT*  Clinical Data: Abdominal pain and constipation.  Nephrolithiasis.  ACUTE ABDOMEN SERIES (ABDOMEN 2 VIEW & CHEST 1 VIEW)  Comparison: Chest 04/22/2011  Findings: Normal heart size and pulmonary vascularity. Emphysematous changes scattered fibrosis in the lungs.  Linear fibrosis in the lung bases.  Scarring in the apices.  No focal airspace disease or consolidation in the lungs.  No blunting of costophrenic angles.  No pneumothorax.  Calcification of the aorta. No significant change since previous chest.  Gas and stool throughout the colon.  No small or large bowel distension.  No free intra-abdominal air.  No abnormal air fluid levels.  There is a calcification in the pelvis measuring 2.3 cm consistent with bladder stone is seen on previous CT 04/20/2013. Calcified phleboliths in the pelvis.  No definite renal stones although a large amount of stool in the transverse colon may obscure visualization of renal stones.  Vascular calcifications. Degenerative changes in the spine.  IMPRESSION: Emphysematous changes and fibrosis in the lungs.   Bladder stone. Stool filled colon.  Nonobstructive bowel gas pattern.   Original Report Authenticated By: Burman Nieves, M.D.    Scheduled Meds: . atorvastatin  10 mg Oral QHS  . bacitracin-neomycin-polymyxin-hydrocortisone   Topical TID  . bisacodyl  10 mg Rectal BID  . bisacodyl  10 mg Rectal Daily  . carvedilol  6.25 mg Oral BID WC  . cefTRIAXone (ROCEPHIN)  IV  1 g Intravenous Q24H  . cholecalciferol  400 Units Oral Daily  . docusate sodium  200 mg Oral BID  . dutasteride  0.5 mg Oral Daily  . escitalopram  10 mg Oral Daily  . hydrocortisone   Rectal QID  . magnesium hydroxide  30 mL  Oral Daily  . meloxicam  15 mg Oral q morning - 10a  . mineral oil  1 enema Rectal Once  . multivitamin with minerals  1 tablet Oral Daily  . oxybutynin  5 mg Oral TID  . pantoprazole  40 mg Oral Daily  . polyethylene glycol  17 g Oral BID  . tamsulosin  0.4 mg Oral Daily   Continuous Infusions:   Principal Problem:   Complicated UTI (urinary tract infection) Active Problems:   HTN (hypertension)   Urinary bladder stone    Time spent: 35 minutes.     Tinslee Klare  [facility] Pager [phone]. If 7PM-7AM, please contact night-coverage at [email or web address], password [facility] 04/22/2013, 12:16 PM  LOS: 2 days

## 2013-04-22 NOTE — Progress Notes (Signed)
Pt complained of some irritation coming from his hemorrhoids. MD notified. New orders written and initiated.

## 2013-04-23 ENCOUNTER — Ambulatory Visit (HOSPITAL_COMMUNITY): Admission: RE | Admit: 2013-04-23 | Payer: Medicare Other | Source: Ambulatory Visit | Admitting: Urology

## 2013-04-23 HISTORY — DX: Essential (primary) hypertension: I10

## 2013-04-23 HISTORY — DX: Obstructive sleep apnea (adult) (pediatric): G47.33

## 2013-04-23 HISTORY — DX: Gastro-esophageal reflux disease without esophagitis: K21.9

## 2013-04-23 HISTORY — DX: Calculus in bladder: N21.0

## 2013-04-23 HISTORY — DX: Personal history of malignant neoplasm of bladder: Z85.51

## 2013-04-23 HISTORY — DX: Old myocardial infarction: I25.2

## 2013-04-23 HISTORY — DX: Benign prostatic hyperplasia without lower urinary tract symptoms: N40.0

## 2013-04-23 SURGERY — TRANSURETHRAL RESECTION OF THE PROSTATE WITH GYRUS INSTRUMENTS
Anesthesia: General

## 2013-04-23 MED ORDER — BACIT-POLY-NEO HC 1 % EX OINT: TOPICAL_OINTMENT | Freq: Three times a day (TID) | CUTANEOUS | Status: DC

## 2013-04-23 MED ORDER — DSS 100 MG PO CAPS: 200.0000 mg | ORAL_CAPSULE | Freq: Two times a day (BID) | ORAL | Status: DC

## 2013-04-23 MED ORDER — POLYETHYLENE GLYCOL 3350 17 G PO PACK: 17.0000 g | PACK | Freq: Two times a day (BID) | ORAL | Status: DC

## 2013-04-23 MED ORDER — NITROFURANTOIN MONOHYD MACRO 100 MG PO CAPS
100.0000 mg | ORAL_CAPSULE | Freq: Two times a day (BID) | ORAL | Status: DC
  Filled 2013-04-23 (×2): qty 1

## 2013-04-23 MED ORDER — BELLADONNA ALKALOIDS-OPIUM 16.2-60 MG RE SUPP: 1.0000 | Freq: Four times a day (QID) | RECTAL | Status: DC | PRN

## 2013-04-23 MED ORDER — CEPHALEXIN 500 MG PO CAPS: 500.0000 mg | ORAL_CAPSULE | Freq: Two times a day (BID) | ORAL | Status: DC

## 2013-04-23 MED ORDER — CEPHALEXIN 500 MG PO CAPS
500.0000 mg | ORAL_CAPSULE | Freq: Two times a day (BID) | ORAL | Status: DC
  Administered 2013-04-23: 500 mg via ORAL
  Filled 2013-04-23 (×2): qty 1

## 2013-04-23 MED ORDER — HYDROCORTISONE 2.5 % RE CREA: TOPICAL_CREAM | Freq: Four times a day (QID) | RECTAL | Status: DC

## 2013-04-23 NOTE — Progress Notes (Signed)
  Subjective: Patient reports that he is feeling much better, with hardly any bladder spasms  Objective: Vital signs in last 24 hours: Temp:  [97.8 F (36.6 C)-98.3 F (36.8 C)] 98.3 F (36.8 C) (06/12 0530) Pulse Rate:  [61-62] 62 (06/12 0530) Resp:  [17-18] 17 (06/12 0530) BP: (141-151)/(75-80) 151/80 mmHg (06/12 0530) SpO2:  [95 %-100 %] 95 % (06/12 0530)  Intake/Output from previous day: 06/11 0701 - 06/12 0700 In: -  Out: 1000 [Urine:1000] Intake/Output this shift: Total I/O In: 240 [P.O.:240] Out: -   Physical Exam:  Constitutional: Vital signs reviewed. WD WN in NAD   Eyes: PERRL, No scleral icterus.     Lab Results:  Recent Labs  04/20/13 2220 04/21/13 0515 04/22/13 0500  HGB 13.5 13.8 13.4  HCT 39.7 40.6 39.9   BMET  Recent Labs  04/21/13 0515 04/22/13 0500  NA 135 133*  K 4.1 4.2  CL 101 99  CO2 31 28  GLUCOSE 101* 98  BUN 15 14  CREATININE 1.21 0.97  CALCIUM 8.7 8.8   No results found for this basename: LABPT, INR,  in the last 72 hours No results found for this basename: LABURIN,  in the last 72 hours Results for orders placed during the hospital encounter of 04/20/13  URINE CULTURE     Status: None   Collection Time    04/20/13 10:33 PM      Result Value Range Status   Specimen Description URINE, CATHETERIZED   Final   Special Requests NONE   Final   Culture  Setup Time 04/21/2013 03:46   Final   Colony Count >=100,000 COLONIES/ML   Final   Culture ESCHERICHIA COLI   Final   Report Status 04/22/2013 FINAL   Final   Organism ID, Bacteria ESCHERICHIA COLI   Final    Studies/Results: No results found.  Assessment/Plan:   Urinary tract infection, resistant Escherichia coli. He is currently adequately covered with Rocephin. Even though nitrofurantoin shows good sensitivities with the Escherichia coli, it is not adequate coverage, as he may have prostatitis as well, and with the catheter in, there is not good penetration of the  prostate, kidneys, and with no indwelling urine in the bladder, is not the appropriate choice.    BPH with obstruction and stone, currently treated adequately with catheter. He is having fewer bladder spasms.    Okay to send the patient home from the urologic standpoint. I would switch him off of the nitrofurantoin, and send him home with 10 days of an appropriate antibiotic. We have moved his surgery to the week after next. We'll notify him of that.   LOS: 3 days   Marcine Matar M 04/23/2013, 9:03 AM

## 2013-04-23 NOTE — Discharge Summary (Signed)
Physician Discharge Summary  Anthony Hartman:[id number] DOB: Aug 15, 1941 DOA: 04/20/2013  PCP: [name], MD  Admit date: 04/20/2013 Discharge date: 04/23/2013  Time spent: 35 minutes  Recommendations for Outpatient Follow-up:  1. Follow up with Dr Lynnae Sandhoff for further treatment of bladder stone.   Discharge Diagnoses:    Complicated UTI (urinary tract infection)   Bladder stone   BPH   HTN (hypertension)   Urinary bladder stone   Discharge Condition: Stable.   Diet recommendation: Heart Healthy.   There were no vitals filed for this visit.  History of present illness:  Anthony Hartman is a 72 y.o. male with urinary bladder stone, h/o bladder cancer who is scheduled to undergo lithotripsy for the stone which has been causing bladder spasm as well as prostate reduction in 2 days. He had a foley catheter recently placed by Dr. Retta Diones in the office. Unfortunately he now presents to the ED with a couple day history of worsening lower abdominal pain, dysuria, fevers up to 100.8 at home, chills, nausea, and hematuria. He notes his urine has developed a foul odor and is concerned that it may be infected.  In the ED he is noted to have temperature of 99.2, WBC of 12.0, and is indeed confirmed to have a UTI. Urology has been consulted and per EDP is now considering taking out the stone sooner as it is likely now colonized and they will see him tomorrow, hospitalist has been asked to admit.   Hospital Course:  1. Cystitis / Complicated UTI (urinary tract infection) - Patient with history of BPH and bladder stone who was scheduled for an outpatient surgery by Dr. Retta Diones on 04/23/2013.  He presents with dysuria, abdominal pain fever. Found to have E coli UTI. Patient has chronic foley catheter. He received 2 days of IV ceftriaxone. Urine culture grew E coli which is sensitive to cephalosporin. Patient will be discharge on Keflex 500 mg BID for total of 14 days. Patient will need to follow up  with Dr Retta Diones for further care.  -Continue wit B and O suppository for bladder spasm.  2.Constipation: had a good bowel movement. 3. Hypertension continue with Coreg.  4. Dyslipidemia continue with statin  5.Hemorrhoids. Continue with anusol creaml. No more bleeding today.  6.DVT prophylaxis: SCD.   Procedures: Renal US; No evidence of hydronephrosis bilaterally.   Consultations:  Dr Lynnae Sandhoff.   Discharge Exam: Filed Vitals:   04/22/13 0600 04/22/13 1417 04/22/13 2127 04/23/13 0530  BP: 139/67 141/75 142/75 151/80  Pulse: 60 62 61 62  Temp: 98.7 F (37.1 C) 97.8 F (36.6 C) 98.3 F (36.8 C) 98.3 F (36.8 C)  TempSrc: Oral Oral Oral Oral  Resp: 18 18 18 17   SpO2: 95% 100% 99% 95%    General: no distress.  Cardiovascular: S 1, S 2 RRR Respiratory: CTA  Discharge Instructions  Discharge Orders   Future Orders Complete By Expires     Diet - low sodium heart healthy  As directed     Increase activity slowly  As directed         Medication List    STOP taking these medications       ciprofloxacin 250 MG tablet  Commonly known as:  CIPRO     URIBEL 118 MG Caps      TAKE these medications       ALPRAZolam 1 MG tablet  Commonly known as:  XANAX  Take 0.5-1 mg by mouth 3 (three) times daily as needed  for anxiety.     atorvastatin 10 MG tablet  Commonly known as:  LIPITOR  Take 10 mg by mouth at bedtime.     bacitracin-neomycin-polymyxin-hydrocortisone 1 % ointment  Commonly known as:  CORTISPORIN  Apply topically 3 (three) times daily.     carvedilol 6.25 MG tablet  Commonly known as:  COREG  Take 6.25 mg by mouth 2 (two) times daily with a meal.     cephALEXin 500 MG capsule  Commonly known as:  KEFLEX  Take 1 capsule (500 mg total) by mouth every 12 (twelve) hours.     cholecalciferol 400 UNITS Tabs  Commonly known as:  VITAMIN D  Take 400 Units by mouth daily.     diazepam 5 MG tablet  Commonly known as:  VALIUM  Take 5 mg by mouth every  6 (six) hours as needed for anxiety (and muscle spasms).     DSS 100 MG Caps  Take 200 mg by mouth 2 (two) times daily.     dutasteride 0.5 MG capsule  Commonly known as:  AVODART  Take 0.5 mg by mouth daily.     escitalopram 10 MG tablet  Commonly known as:  LEXAPRO  Take 10 mg by mouth daily.     hydrocortisone 2.5 % rectal cream  Commonly known as:  ANUSOL-HC  Place rectally 4 (four) times daily.     MOBIC 15 MG tablet  Generic drug:  meloxicam  Take 15 mg by mouth every morning.     multivitamin with minerals Tabs  Take 1 tablet by mouth daily.     omeprazole 20 MG capsule  Commonly known as:  PRILOSEC  Take 20 mg by mouth every morning.     opium-belladonna 16.2-60 MG suppository  Commonly known as:  B&O SUPPRETTES  Place 1 suppository rectally every 6 (six) hours as needed.     oxybutynin 5 MG tablet  Commonly known as:  DITROPAN  Take 5 mg by mouth 3 (three) times daily.     polyethylene glycol packet  Commonly known as:  MIRALAX / GLYCOLAX  Take 17 g by mouth 2 (two) times daily.     silodosin 8 MG Caps capsule  Commonly known as:  RAPAFLO  Take 8 mg by mouth daily with breakfast.     sodium phosphate Pediatric 3.5-9.5 GM/59ML enema  Place 1 enema rectally once.       Allergies  Allergen Reactions  . Augmentin (Amoxicillin-Pot Clavulanate) Nausea And Vomiting  . Sulfa Antibiotics Nausea And Vomiting       Follow-up Information   Follow up with DAHLSTEDT, Bertram Millard, MD. [facility] notify you of the change in surgical date)    Contact information:   [address] [place] [address] [phone]        The results of significant diagnostics from this hospitalization (including imaging, microbiology, ancillary and laboratory) are listed below for reference.    Significant Diagnostic Studies: US Renal  04/20/2013   *RADIOLOGY REPORT*  Clinical Data: Bladder calculus.  RENAL/URINARY TRACT ULTRASOUND COMPLETE  Comparison:  CT of  the abdomen and pelvis earlier today at [facility].  Findings:  Right Kidney:  13.7 cm in estimated length.  Normal sonographic appearance without evidence of hydronephrosis, shadowing calculus or focal lesion.  Left Kidney:  15 cm in estimated length.  Normal sonographic appearance without evidence of hydronephrosis, focal lesion or shadowing calculus.  Bladder:  The bladder is decompressed by a Foley catheter. Shadowing calcification  in the bladder adjacent to the Foley catheter balloon likely represents the large calculus identified by CT in the bladder.  IMPRESSION: No evidence of hydronephrosis bilaterally.   Original Report Authenticated By: Irish Lack, M.D.   Dg Abd Acute W/chest  04/21/2013   *RADIOLOGY REPORT*  Clinical Data: Abdominal pain and constipation.  Nephrolithiasis.  ACUTE ABDOMEN SERIES (ABDOMEN 2 VIEW & CHEST 1 VIEW)  Comparison: Chest 04/22/2011  Findings: Normal heart size and pulmonary vascularity. Emphysematous changes scattered fibrosis in the lungs.  Linear fibrosis in the lung bases.  Scarring in the apices.  No focal airspace disease or consolidation in the lungs.  No blunting of costophrenic angles.  No pneumothorax.  Calcification of the aorta. No significant change since previous chest.  Gas and stool throughout the colon.  No small or large bowel distension.  No free intra-abdominal air.  No abnormal air fluid levels.  There is a calcification in the pelvis measuring 2.3 cm consistent with bladder stone is seen on previous CT 04/20/2013. Calcified phleboliths in the pelvis.  No definite renal stones although a large amount of stool in the transverse colon may obscure visualization of renal stones.  Vascular calcifications. Degenerative changes in the spine.  IMPRESSION: Emphysematous changes and fibrosis in the lungs.   Bladder stone. Stool filled colon.  Nonobstructive bowel gas pattern.   Original Report Authenticated By: Burman Nieves, M.D.     Microbiology: Recent Results (from the past 240 hour(s))  URINE CULTURE     Status: None   Collection Time    04/20/13 10:33 PM      Result Value Range Status   Specimen Description URINE, CATHETERIZED   Final   Special Requests NONE   Final   Culture  Setup Time 04/21/2013 03:46   Final   Colony Count >=100,000 COLONIES/ML   Final   Culture ESCHERICHIA COLI   Final   Report Status 04/22/2013 FINAL   Final   Organism ID, Bacteria ESCHERICHIA COLI   Final     Labs: Basic Metabolic Panel:  Recent Labs Lab 04/20/13 2220 04/21/13 0515 04/22/13 0500  NA 133* 135 133*  K 4.1 4.1 4.2  CL 98 101 99  CO2 26 31 28   GLUCOSE 129* 101* 98  BUN 17 15 14   CREATININE 1.16 1.21 0.97  CALCIUM 9.0 8.7 8.8   Liver Function Tests: No results found for this basename: AST, ALT, ALKPHOS, BILITOT, PROT, ALBUMIN,  in the last 168 hours No results found for this basename: LIPASE, AMYLASE,  in the last 168 hours No results found for this basename: AMMONIA,  in the last 168 hours CBC:  Recent Labs Lab 04/20/13 2220 04/21/13 0515 04/22/13 0500  WBC 12.0* 10.9* 7.3  NEUTROABS 9.7*  --   --   HGB 13.5 13.8 13.4  HCT 39.7 40.6 39.9  MCV 91.1 92.5 91.5  PLT 233 227 229   Cardiac Enzymes: No results found for this basename: CKTOTAL, CKMB, CKMBINDEX, TROPONINI,  in the last 168 hours BNP: BNP (last 3 results) No results found for this basename: PROBNP,  in the last 8760 hours CBG: No results found for this basename: GLUCAP,  in the last 168 hours     Signed:  Kabao Leite  [facility] 04/23/2013, 9:51 AM

## 2013-04-23 NOTE — Progress Notes (Signed)
Patient discharged home in stable condition.  No change from morning assessment.  Instructions and scripts given to patient with verbal understanding and feedback.

## 2013-04-23 NOTE — Progress Notes (Signed)
  Subjective: Patient reports that he is having less bladder pain  Objective: Vital signs in last 24 hours: Temp:  [97.8 F (36.6 C)-98.3 F (36.8 C)] 98.3 F (36.8 C) (06/12 0530) Pulse Rate:  [61-62] 62 (06/12 0530) Resp:  [17-18] 17 (06/12 0530) BP: (141-151)/(75-80) 151/80 mmHg (06/12 0530) SpO2:  [95 %-100 %] 95 % (06/12 0530)  Intake/Output from previous day: 06/11 0701 - 06/12 0700 In: -  Out: 1000 [Urine:1000] Intake/Output this shift:    Physical Exam:  Constitutional: Vital signs reviewed. WD WN in NAD   Eyes: PERRL, No scleral icterus.     Lab Results:  Recent Labs  04/20/13 2220 04/21/13 0515 04/22/13 0500  HGB 13.5 13.8 13.4  HCT 39.7 40.6 39.9   BMET  Recent Labs  04/21/13 0515 04/22/13 0500  NA 135 133*  K 4.1 4.2  CL 101 99  CO2 31 28  GLUCOSE 101* 98  BUN 15 14  CREATININE 1.21 0.97  CALCIUM 8.7 8.8   No results found for this basename: LABPT, INR,  in the last 72 hours No results found for this basename: LABURIN,  in the last 72 hours Results for orders placed during the hospital encounter of 04/20/13  URINE CULTURE     Status: None   Collection Time    04/20/13 10:33 PM      Result Value Range Status   Specimen Description URINE, CATHETERIZED   Final   Special Requests NONE   Final   Culture  Setup Time 04/21/2013 03:46   Final   Colony Count >=100,000 COLONIES/ML   Final   Culture ESCHERICHIA COLI   Final   Report Status 04/22/2013 FINAL   Final   Organism ID, Bacteria ESCHERICHIA COLI   Final    Studies/Results: No results found.  Assessment/Plan:   BOO/Bladder stone w/ UTI -scheduled for TUR-P tomorrow. He is appropriately on rocephin for a probable res. E.coli UTI. We will cancel his TUR-P for tomorrow.  Once sens. of E.coli established, can probably d/c home on appropriate abx   LOS: 3 days   Marcine Matar M 04/23/2013, 7:24 AM

## 2013-04-23 NOTE — Plan of Care (Signed)
Problem: Phase I Progression Outcomes Goal: Voiding-avoid urinary catheter unless indicated Outcome: Not Met (add Reason) Home with foley

## 2013-05-04 ENCOUNTER — Encounter (HOSPITAL_COMMUNITY): Payer: Self-pay | Admitting: Pharmacy Technician

## 2013-05-04 ENCOUNTER — Encounter (HOSPITAL_COMMUNITY): Payer: Self-pay

## 2013-05-04 ENCOUNTER — Encounter (HOSPITAL_COMMUNITY)
Admission: RE | Admit: 2013-05-04 | Discharge: 2013-05-04 | Disposition: A | Discharge status: Home / Self Care | Payer: Medicare Other | Source: Ambulatory Visit | Attending: Urology | Admitting: Urology

## 2013-05-04 HISTORY — DX: Personal history of urinary calculi: Z87.442

## 2013-05-04 HISTORY — DX: Anxiety disorder, unspecified: F41.9

## 2013-05-04 HISTORY — DX: Acute myocardial infarction, unspecified: I21.9

## 2013-05-04 LAB — CBC
HCT: 42.8 % (ref 39.0–52.0)
Hemoglobin: 14.9 g/dL (ref 13.0–17.0)
MCH: 31.8 pg (ref 26.0–34.0)
MCHC: 34.8 g/dL (ref 30.0–36.0)
MCV: 91.5 fL (ref 78.0–100.0)
Platelets: 348 10*3/uL (ref 150–400)
RBC: 4.68 MIL/uL (ref 4.22–5.81)
RDW: 12.3 % (ref 11.5–15.5)
WBC: 7 10*3/uL (ref 4.0–10.5)

## 2013-05-04 LAB — BASIC METABOLIC PANEL
BUN: 25 mg/dL — ABNORMAL HIGH (ref 6–23)
CO2: 28 mEq/L (ref 19–32)
Calcium: 9.3 mg/dL (ref 8.4–10.5)
Chloride: 103 mEq/L (ref 96–112)
Creatinine, Ser: 1.03 mg/dL (ref 0.50–1.35)
GFR calc Af Amer: 82 mL/min — ABNORMAL LOW (ref >90)
GFR calc non Af Amer: 71 mL/min — ABNORMAL LOW (ref >90)
Glucose, Bld: 121 mg/dL — ABNORMAL HIGH (ref 70–99)
Potassium: 4.5 mEq/L (ref 3.5–5.1)
Sodium: 137 mEq/L (ref 135–145)

## 2013-05-04 LAB — SURGICAL PCR SCREEN
MRSA, PCR: NEGATIVE
Staphylococcus aureus: NEGATIVE

## 2013-05-04 NOTE — Patient Instructions (Addendum)
ELIHU MILSTEIN  05/04/2013                           YOUR PROCEDURE IS SCHEDULED ON: 05/07/13               PLEASE REPORT TO SHORT STAY CENTER AT : 5:15 AM               CALL THIS NUMBER IF ANY PROBLEMS THE DAY OF SURGERY :               [phone]                      REMEMBER:   Do not eat food or drink liquids AFTER MIDNIGHT   Take these medicines the morning of surgery with A SIP OF WATER: CARVEDILOL / OMEPRAZOLE / OXYBUTYNIN / ALPRAZOLAM OR VALIUM IF             NEEDED    Do not wear jewelry, make-up   Do not wear lotions, powders, or perfumes.   Do not shave legs or underarms 12 hrs. before surgery (men may shave face)  Do not bring valuables to the hospital.  Contacts, dentures or bridgework may not be worn into surgery.  Leave suitcase in the car. After surgery it may be brought to your room.  For patients admitted to the hospital more than one night, checkout time is 11:00                          The day of discharge.   Patients discharged the day of surgery will not be allowed to drive home                             If going home same day of surgery, must have someone stay with you first                           24 hrs at home and arrange for some one to drive you home from hospital.    Special Instructions:   Please read over the following fact sheets that you were given:               1. MRSA  INFORMATION                      2. [place] PREPARING FOR SURGERY SHEET               3. BRING C-PAP MASK AND TUBING TO HOSPITAL                                                X_____________________________________________________________________        Failure to follow these instructions may result in cancellation of your surgery

## 2013-05-04 NOTE — Progress Notes (Signed)
Abnormal BMET faxed to Dr. Retta Diones

## 2013-05-06 NOTE — H&P (Signed)
Urology History and Physical Exam  CC: Urinary retention  HPI: 72 year old male presents for elective TUR-P and cystolithalopaxy for urinary retention and a bladder stone. His history is as follows:  1. Carcinoma in situ in January 2001, had a routine followup for his hypogonadism, he was found to have hematuria. He underwent cystoscopy in early 2001 which revealed an erythematous lesion in the bladder. He underwent an anesthetic cysto and biopsy which revealed only dysplasia. Followup cystoscopy secondary to persistent hematuria was performed a repeat of time, and return dysplasia. Finally, on 05/22/2002, a repeat cystoscopy and biopsy revealed carcinoma in situ. This was followed by induction and maintenance therapy of BCG/Intron A. He was put on a study, and completed 2 years of this treatment-the maintenance was completed on 08/2005.  He underwent continued surveillance, and by 08/2006, was found to have a positive cytology. He underwent repeat cystoscopy and biopsy. This revealed recurrent carcinoma in situ. He was treated again with BCG. He completed his maintenance BCG in March of 2010. He has had negative cytologies and cystoscopies since that time. He has stopped smoking cigarettes.  2. Nodular prostate with elevating PSA to 3.42. In April 2010, following a PSA increased to 3.42, he underwent ultrasound and biopsy of the prostate. This revealed benign tissue with BCG granulomas. He has been followed with a stable PSA trend since that time.  3. Hypogonadism. Prior to his first presentation, in 1997, he had been on testosterone injections. He has not been on testosterone injections recently.   4. Erectile dysfunction. He is on Levitra p.r.n.  5. History of urolithiasis. In February of 2007, he was treated with lithotripsy for a 6 mm right ureteropelvic junction calculus.   6. Bladder stone-at last cystoscopy, he was noted to have a 1.5 cm bladder stone- cystolitholapaxy scheduled for June  2014   7.UTI/urinary retention. He has had an indwelling foley catheter for the past few weeks due to BOO. He was recently admitted to [facility] on 6/10 for a resistant UTI that has been appropriately treated.  PMH: Past Medical History  Diagnosis Date  . Hyperlipidemia   . Arthritis   . History of bladder cancer     CARCINOMA IN SITU OF BLADDER  . BPH (benign prostatic hypertrophy)   . Bladder stone   . Foley catheter in place   . Hypertension   . GERD (gastroesophageal reflux disease)   . Coronary artery disease cardiologist--    DR Mardelle Matte [facility] ([facility])  . History of myocardial infarction     07-17-2011--   MI W/ OCCLUDED OM1  . Complication of anesthesia     "I didn't feel right after endoscopy anesthesia"   . Myocardial infarction 2012    cardiac studies on chart  . History of kidney stones   . OSA (obstructive sleep apnea)     cpap non-compliant   . Anxiety     PSH: Past Surgical History  Procedure Laterality Date  . Cardiovascular stress test  04/22/11  . Lumbar decompression foraminotomy l3 -- l5/  removal cyst l4-5 facet joint  08-21-2009    ALSO HAD PRIOR BACK SURG IN 1992  . Cysto/ bladder bx's  X2  2001  / X1  2003  . Cysto/ bladder bx's/ bilateral retrograde ureteropylegram  2003; 2007; 2008;  12-30-2007    CARCINOMA IN SITU OF BLADDER  . Transurethral resection of bladder tumor  01-10-2004  . Transthoracic echocardiogram  07-17-2011  [facility]  MILD TO MODERATE CONCENTRIC LVH/ NORMAL LVSF/ EF 55-60%/ MILD AORTIC STENOSIS  . Cardiac catheterization  07-17-2011  DR ANDY CHIU ([facility])    OCCLUDED OM1 WITH UNSUCCESSFUL PCI ATTEMPT/ MILD TO MODERATE DISEASE ELSEWHERE  . Open tenotomy of flexor 2nd and 3rd right toes  APRIL 2008    Allergies: Allergies  Allergen Reactions  . Augmentin (Amoxicillin-Pot Clavulanate) Nausea And Vomiting  . Sulfa Antibiotics Nausea And Vomiting    Medications: No prescriptions prior to  admission     Social History: History   Social History  . Marital Status: Married    Spouse Name: N/A    Number of Children: N/A  . Years of Education: N/A   Occupational History  . Not on file.   Social History Main Topics  . Smoking status: Former Smoker -- 0.50 packs/day for 50 years    Types: Cigarettes    Quit date: 04/15/2010  . Smokeless tobacco: Current User    Types: Snuff     Comment: occasional  snuff pack  . Alcohol Use: Yes     Comment: social  . Drug Use: No  . Sexually Active: Not on file   Other Topics Concern  . Not on file   Social History Narrative  . No narrative on file    Family History: No family history on file.  Review of Systems: Positive: Inability to urinate, bladder spasms Negative:   A further 10 point review of systems was negative except what is listed in the HPI.  Physical Exam: @VITALS2 @ General: No acute distress.  Awake. Head:  Normocephalic.  Atraumatic. ENT:  EOMI.  Mucous membranes moist Neck:  Supple.  No lymphadenopathy. CV:  S1 present. S2 present. Regular rate. Pulmonary: Equal effort bilaterally.  Clear to auscultation bilaterally. Abdomen: Soft.  Non tender to palpation. Skin:  Normal turgor.  No visible rash. Extremity: No gross deformity of bilateral upper extremities.  No gross deformity of    bilateral lower extremities. Neurologic: Alert. Appropriate mood.   Studies:  Recent Labs     05/04/13  1515  HGB  14.9  WBC  7.0  PLT  348    Recent Labs     05/04/13  1515  NA  137  K  4.5  CL  103  CO2  28  BUN  25*  CREATININE  1.03  CALCIUM  9.3  GFRNONAA  71*  GFRAA  82*     No results found for this basename: PT, INR, APTT,  in the last 72 hours   No components found with this basename: ABG,     Assessment:  BPH with retention and a bladder stone  Plan: TUR-P, cystolithalopaxy

## 2013-05-07 ENCOUNTER — Encounter (HOSPITAL_COMMUNITY): Payer: Self-pay | Admitting: *Deleted

## 2013-05-07 ENCOUNTER — Encounter (HOSPITAL_COMMUNITY)
Admission: RE | Disposition: A | Discharge status: Home / Self Care | Payer: Self-pay | Source: Ambulatory Visit | Attending: Urology

## 2013-05-07 ENCOUNTER — Observation Stay (HOSPITAL_BASED_OUTPATIENT_CLINIC_OR_DEPARTMENT_OTHER)
Admission: RE | Admit: 2013-05-07 | Discharge: 2013-05-08 | Disposition: A | Discharge status: Home / Self Care | Payer: Medicare Other | Source: Ambulatory Visit | Attending: Urology | Admitting: Urology

## 2013-05-07 ENCOUNTER — Ambulatory Visit (HOSPITAL_COMMUNITY): Payer: Medicare Other | Admitting: *Deleted

## 2013-05-07 DIAGNOSIS — N529 Male erectile dysfunction, unspecified: Secondary | ICD-10-CM | POA: Insufficient documentation

## 2013-05-07 DIAGNOSIS — D075 Carcinoma in situ of prostate: Secondary | ICD-10-CM | POA: Insufficient documentation

## 2013-05-07 DIAGNOSIS — Z79899 Other long term (current) drug therapy: Secondary | ICD-10-CM | POA: Insufficient documentation

## 2013-05-07 DIAGNOSIS — I2510 Atherosclerotic heart disease of native coronary artery without angina pectoris: Secondary | ICD-10-CM | POA: Insufficient documentation

## 2013-05-07 DIAGNOSIS — E785 Hyperlipidemia, unspecified: Secondary | ICD-10-CM | POA: Insufficient documentation

## 2013-05-07 DIAGNOSIS — N210 Calculus in bladder: Secondary | ICD-10-CM | POA: Insufficient documentation

## 2013-05-07 DIAGNOSIS — G4733 Obstructive sleep apnea (adult) (pediatric): Secondary | ICD-10-CM | POA: Insufficient documentation

## 2013-05-07 DIAGNOSIS — K219 Gastro-esophageal reflux disease without esophagitis: Secondary | ICD-10-CM | POA: Insufficient documentation

## 2013-05-07 DIAGNOSIS — N139 Obstructive and reflux uropathy, unspecified: Secondary | ICD-10-CM | POA: Insufficient documentation

## 2013-05-07 DIAGNOSIS — R339 Retention of urine, unspecified: Secondary | ICD-10-CM | POA: Insufficient documentation

## 2013-05-07 DIAGNOSIS — N390 Urinary tract infection, site not specified: Secondary | ICD-10-CM | POA: Insufficient documentation

## 2013-05-07 DIAGNOSIS — N402 Nodular prostate without lower urinary tract symptoms: Secondary | ICD-10-CM | POA: Insufficient documentation

## 2013-05-07 DIAGNOSIS — E291 Testicular hypofunction: Secondary | ICD-10-CM | POA: Insufficient documentation

## 2013-05-07 DIAGNOSIS — Z87442 Personal history of urinary calculi: Secondary | ICD-10-CM | POA: Insufficient documentation

## 2013-05-07 DIAGNOSIS — N138 Other obstructive and reflux uropathy: Principal | ICD-10-CM | POA: Insufficient documentation

## 2013-05-07 DIAGNOSIS — I252 Old myocardial infarction: Secondary | ICD-10-CM | POA: Insufficient documentation

## 2013-05-07 DIAGNOSIS — N401 Enlarged prostate with lower urinary tract symptoms: Secondary | ICD-10-CM | POA: Insufficient documentation

## 2013-05-07 HISTORY — PX: CYSTOSCOPY WITH LITHOLAPAXY: SHX1425

## 2013-05-07 HISTORY — PX: TRANSURETHRAL RESECTION OF PROSTATE: SHX73

## 2013-05-07 SURGERY — TRANSURETHRAL RESECTION OF THE PROSTATE WITH GYRUS INSTRUMENTS
Anesthesia: General | Wound class: Clean Contaminated

## 2013-05-07 MED ORDER — FENTANYL CITRATE 0.05 MG/ML IJ SOLN
INTRAMUSCULAR | Status: AC
  Filled 2013-05-07: qty 2

## 2013-05-07 MED ORDER — MAGNESIUM HYDROXIDE 400 MG/5ML PO SUSP: 15.0000 mL | Freq: Every day | ORAL | Status: DC | PRN

## 2013-05-07 MED ORDER — SODIUM CHLORIDE 0.9 % IR SOLN
Status: DC | PRN
  Administered 2013-05-07: 15000 mL

## 2013-05-07 MED ORDER — STERILE WATER FOR IRRIGATION IR SOLN
Status: DC | PRN
  Administered 2013-05-07: 6000 mL

## 2013-05-07 MED ORDER — LACTATED RINGERS IV SOLN
INTRAVENOUS | Status: DC | PRN
  Administered 2013-05-07 (×2): via INTRAVENOUS

## 2013-05-07 MED ORDER — OXYBUTYNIN CHLORIDE 5 MG PO TABS
5.0000 mg | ORAL_TABLET | Freq: Three times a day (TID) | ORAL | Status: DC | PRN
  Filled 2013-05-07: qty 1

## 2013-05-07 MED ORDER — STERILE WATER FOR IRRIGATION IR SOLN
Status: DC | PRN
  Administered 2013-05-07: 4000 mL

## 2013-05-07 MED ORDER — DIAZEPAM 5 MG PO TABS
5.0000 mg | ORAL_TABLET | Freq: Four times a day (QID) | ORAL | Status: DC | PRN
  Administered 2013-05-07: 5 mg via ORAL
  Filled 2013-05-07: qty 1

## 2013-05-07 MED ORDER — MIDAZOLAM HCL 5 MG/5ML IJ SOLN
INTRAMUSCULAR | Status: DC | PRN
  Administered 2013-05-07 (×2): 0.5 mg via INTRAVENOUS

## 2013-05-07 MED ORDER — GENTAMICIN SULFATE 40 MG/ML IJ SOLN
480.0000 mg | Freq: Once | INTRAVENOUS | Status: AC
  Administered 2013-05-08: 480 mg via INTRAVENOUS
  Filled 2013-05-07: qty 12

## 2013-05-07 MED ORDER — LIDOCAINE HCL 2 % EX GEL
CUTANEOUS | Status: AC
  Filled 2013-05-07: qty 10

## 2013-05-07 MED ORDER — ONDANSETRON HCL 4 MG/2ML IJ SOLN
INTRAMUSCULAR | Status: DC | PRN
  Administered 2013-05-07 (×2): 2 mg via INTRAVENOUS

## 2013-05-07 MED ORDER — ATORVASTATIN CALCIUM 10 MG PO TABS
10.0000 mg | ORAL_TABLET | Freq: Every day | ORAL | Status: DC
  Administered 2013-05-07: 10 mg via ORAL
  Filled 2013-05-07 (×2): qty 1

## 2013-05-07 MED ORDER — ONDANSETRON HCL 4 MG/2ML IJ SOLN: 4.0000 mg | INTRAMUSCULAR | Status: DC | PRN

## 2013-05-07 MED ORDER — HYDROCODONE-ACETAMINOPHEN 5-325 MG PO TABS
1.0000 | ORAL_TABLET | ORAL | Status: DC | PRN
  Administered 2013-05-07 (×2): 2 via ORAL
  Filled 2013-05-07 (×2): qty 2

## 2013-05-07 MED ORDER — GENTAMICIN SULFATE 40 MG/ML IJ SOLN
520.0000 mg | INTRAMUSCULAR | Status: AC
  Administered 2013-05-07: 520 mg via INTRAVENOUS
  Filled 2013-05-07: qty 13

## 2013-05-07 MED ORDER — METOCLOPRAMIDE HCL 5 MG/ML IJ SOLN
INTRAMUSCULAR | Status: DC | PRN
  Administered 2013-05-07: 5 mg via INTRAVENOUS

## 2013-05-07 MED ORDER — MEPERIDINE HCL 50 MG/ML IJ SOLN: 6.2500 mg | INTRAMUSCULAR | Status: DC | PRN

## 2013-05-07 MED ORDER — PROPOFOL 10 MG/ML IV BOLUS
INTRAVENOUS | Status: DC | PRN
  Administered 2013-05-07: 40 mg via INTRAVENOUS
  Administered 2013-05-07: 25 mg via INTRAVENOUS
  Administered 2013-05-07: 160 mg via INTRAVENOUS

## 2013-05-07 MED ORDER — EPHEDRINE SULFATE 50 MG/ML IJ SOLN
INTRAMUSCULAR | Status: DC | PRN
  Administered 2013-05-07 (×2): 5 mg via INTRAVENOUS

## 2013-05-07 MED ORDER — PROMETHAZINE HCL 25 MG/ML IJ SOLN: 6.2500 mg | INTRAMUSCULAR | Status: DC | PRN

## 2013-05-07 MED ORDER — DOCUSATE SODIUM 100 MG PO CAPS
100.0000 mg | ORAL_CAPSULE | Freq: Two times a day (BID) | ORAL | Status: DC
  Administered 2013-05-07: 100 mg via ORAL
  Filled 2013-05-07 (×3): qty 1

## 2013-05-07 MED ORDER — FENTANYL CITRATE 0.05 MG/ML IJ SOLN
INTRAMUSCULAR | Status: DC | PRN
  Administered 2013-05-07: 50 ug via INTRAVENOUS
  Administered 2013-05-07: 25 ug via INTRAVENOUS
  Administered 2013-05-07 (×2): 50 ug via INTRAVENOUS
  Administered 2013-05-07: 25 ug via INTRAVENOUS
  Administered 2013-05-07: 50 ug via INTRAVENOUS

## 2013-05-07 MED ORDER — PANTOPRAZOLE SODIUM 40 MG PO TBEC
40.0000 mg | DELAYED_RELEASE_TABLET | Freq: Every day | ORAL | Status: DC
  Filled 2013-05-07: qty 1

## 2013-05-07 MED ORDER — LIDOCAINE HCL (CARDIAC) 20 MG/ML IV SOLN
INTRAVENOUS | Status: DC | PRN
  Administered 2013-05-07: 50 mg via INTRAVENOUS

## 2013-05-07 MED ORDER — CARVEDILOL 6.25 MG PO TABS
6.2500 mg | ORAL_TABLET | Freq: Two times a day (BID) | ORAL | Status: DC
  Administered 2013-05-07 – 2013-05-08 (×2): 6.25 mg via ORAL
  Filled 2013-05-07 (×4): qty 1

## 2013-05-07 MED ORDER — POLYETHYLENE GLYCOL 3350 17 G PO PACK
17.0000 g | PACK | Freq: Two times a day (BID) | ORAL | Status: DC | PRN
  Filled 2013-05-07: qty 1

## 2013-05-07 MED ORDER — BACIT-POLY-NEO HC 1 % EX OINT
1.0000 "application " | TOPICAL_OINTMENT | Freq: Two times a day (BID) | CUTANEOUS | Status: DC
  Administered 2013-05-07: 1 via TOPICAL
  Filled 2013-05-07: qty 15

## 2013-05-07 MED ORDER — FENTANYL CITRATE 0.05 MG/ML IJ SOLN
25.0000 ug | INTRAMUSCULAR | Status: DC | PRN
  Administered 2013-05-07 (×2): 50 ug via INTRAVENOUS

## 2013-05-07 MED ORDER — BELLADONNA ALKALOIDS-OPIUM 16.2-60 MG RE SUPP: 1.0000 | Freq: Four times a day (QID) | RECTAL | Status: DC | PRN

## 2013-05-07 MED ORDER — IOHEXOL 300 MG/ML  SOLN
INTRAMUSCULAR | Status: AC
  Filled 2013-05-07: qty 1

## 2013-05-07 MED ORDER — SODIUM CHLORIDE 0.9 % IR SOLN: 3000.0000 mL | Status: DC

## 2013-05-07 MED ORDER — SODIUM CHLORIDE 0.45 % IV SOLN
INTRAVENOUS | Status: DC
  Administered 2013-05-07 – 2013-05-08 (×2): via INTRAVENOUS

## 2013-05-07 MED ORDER — ALPRAZOLAM 0.5 MG PO TABS: 0.5000 mg | ORAL_TABLET | Freq: Three times a day (TID) | ORAL | Status: DC | PRN

## 2013-05-07 MED ORDER — ESCITALOPRAM OXALATE 10 MG PO TABS
10.0000 mg | ORAL_TABLET | Freq: Every day | ORAL | Status: DC
  Administered 2013-05-07: 10 mg via ORAL
  Filled 2013-05-07 (×2): qty 1

## 2013-05-07 SURGICAL SUPPLY — 63 items
ADAPTER CATH URET PLST 4-6FR (CATHETERS) IMPLANT
BAG DRAIN URO-CYSTO SKYTR STRL (DRAIN) IMPLANT
BAG URINE DRAINAGE (UROLOGICAL SUPPLIES) ×2 IMPLANT
BAG URINE LEG 19OZ MD ST LTX (BAG) IMPLANT
BAG URO CATCHER STRL LF (DRAPE) ×2 IMPLANT
BASKET LASER NITINOL 1.9FR (BASKET) IMPLANT
BASKET STNLS GEMINI 4WIRE 3FR (BASKET) IMPLANT
BASKET ZERO TIP NITINOL 2.4FR (BASKET) IMPLANT
CANISTER SUCT LVC 12 LTR MEDI- (MISCELLANEOUS) IMPLANT
CARTRIDGE STONEBREAK CO2 KIDNE (ELECTROSURGICAL) ×14 IMPLANT
CATH FOLEY 2WAY SLVR  5CC 20FR (CATHETERS)
CATH FOLEY 2WAY SLVR  5CC 22FR (CATHETERS)
CATH FOLEY 2WAY SLVR 30CC 22FR (CATHETERS) IMPLANT
CATH FOLEY 2WAY SLVR 5CC 20FR (CATHETERS) IMPLANT
CATH FOLEY 2WAY SLVR 5CC 22FR (CATHETERS) IMPLANT
CATH FOLEY 3WAY 30CC 22F (CATHETERS) IMPLANT
CATH FOLEY 3WAY 30CC 22FR (CATHETERS) ×2 IMPLANT
CATH HEMA 3WAY 30CC 24FR COUDE (CATHETERS) IMPLANT
CATH HEMA 3WAY 30CC 24FR RND (CATHETERS) IMPLANT
CATH INTERMIT  6FR 70CM (CATHETERS) IMPLANT
CATH URET 5FR 28IN CONE TIP (BALLOONS)
CATH URET 5FR 28IN OPEN ENDED (CATHETERS) IMPLANT
CATH URET 5FR 70CM CONE TIP (BALLOONS) IMPLANT
CLOTH BEACON ORANGE TIMEOUT ST (SAFETY) ×2 IMPLANT
DRAPE CAMERA CLOSED 9X96 (DRAPES) ×2 IMPLANT
ELECT BUTTON HF 24-28F 2 30DE (ELECTRODE) IMPLANT
ELECT LOOP MED HF 24F 12D CBL (CLIP) ×2 IMPLANT
ELECT REM PT RETURN 9FT ADLT (ELECTROSURGICAL)
ELECTRODE REM PT RTRN 9FT ADLT (ELECTROSURGICAL) IMPLANT
ELECTROHYDROLIC PROBE 9FR (MISCELLANEOUS) IMPLANT
EVACUATOR MICROVAS BLADDER (UROLOGICAL SUPPLIES) ×2 IMPLANT
GLOVE BIO SURGEON STRL SZ8 (GLOVE) ×2 IMPLANT
GLOVE SURG SS PI 8.0 STRL IVOR (GLOVE) ×2 IMPLANT
GOWN PREVENTION PLUS LG XLONG (DISPOSABLE) ×2 IMPLANT
GOWN PREVENTION PLUS XLARGE (GOWN DISPOSABLE) ×2 IMPLANT
GOWN STRL REIN XL XLG (GOWN DISPOSABLE) IMPLANT
GOWN XL W/COTTON TOWEL STD (GOWNS) IMPLANT
GUIDEWIRE 0.038 PTFE COATED (WIRE) IMPLANT
GUIDEWIRE ANG ZIPWIRE 038X150 (WIRE) IMPLANT
GUIDEWIRE STR DUAL SENSOR (WIRE) ×2 IMPLANT
HOLDER FOLEY CATH W/STRAP (MISCELLANEOUS) IMPLANT
KIT ASPIRATION TUBING (SET/KITS/TRAYS/PACK) IMPLANT
KIT BALLIN UROMAX 15FX10 (LABEL) IMPLANT
KIT BALLN UROMAX 15FX4 (MISCELLANEOUS) IMPLANT
KIT BALLN UROMAX 26 75X4 (MISCELLANEOUS)
LASER FIBER DISP (UROLOGICAL SUPPLIES) IMPLANT
LASER FIBER DISP 1000U (UROLOGICAL SUPPLIES) IMPLANT
MANIFOLD NEPTUNE II (INSTRUMENTS) ×2 IMPLANT
MARKER SKIN DUAL TIP RULER LAB (MISCELLANEOUS) IMPLANT
PACK CYSTO (CUSTOM PROCEDURE TRAY) ×2 IMPLANT
PACK CYSTOSCOPY (CUSTOM PROCEDURE TRAY) IMPLANT
PLUG CATH AND CAP STER (CATHETERS) IMPLANT
PROBE LITHO 3.3FR 2137.235 (UROLOGICAL SUPPLIES) IMPLANT
PROBE LITHO 5.0FR 2137.1505 (MISCELLANEOUS) IMPLANT
PROBE PNEUMATIC 1.6MM (ELECTROSURGICAL) ×2 IMPLANT
SET ASPIRATION TUBING (TUBING) IMPLANT
SET HIGH PRES BAL DIL (LABEL)
SHEATH URET ACCESS 12FR/35CM (UROLOGICAL SUPPLIES) IMPLANT
SHEATH URET ACCESS 12FR/55CM (UROLOGICAL SUPPLIES) IMPLANT
SYR 30ML LL (SYRINGE) ×2 IMPLANT
SYRINGE IRR TOOMEY STRL 70CC (SYRINGE) ×2 IMPLANT
TUBING CONNECTING 10 (TUBING) ×2 IMPLANT
WATER STERILE IRR 500ML POUR (IV SOLUTION) IMPLANT

## 2013-05-07 NOTE — Op Note (Signed)
Preoperative diagnosis: BPH with obstruction, bladder calculus-24 mm  Postoperative diagnosis: Same   Procedure: Cystolitholapaxy, TURP with gyrus    Surgeon: Bertram Millard. Liadan Guizar, M.D.   Anesthesia: Gen.   Complications: None  Specimen(s): Bladder calculus fragments, prostate chips  Drain(s): 22 French three-way Foley catheter to CBI  Indications:*72 year old male with long-standing history of carcinoma in situ, now in remission. He also has BPH. He has had, over the past 2 months, significant symptomatic issues with BPH and obstruction, and has a 24 mm bladder calculus. He has had an indwelling Foley catheter now for about a month, and has been treated recently for a resistant Escherichia coli infection. He presents at this time, having had his infection treated adequately, for TURP as well as cystolitholapaxy of a 24 mm bladder calculus. He is aware of the risks and complications of both procedures which have been discussed with him at length in the office and in the hospital during his recent treatment. He desires to proceed.   Technique and findings: The patient was properly identified in the holding area. He received preoperative IV gentamicin. He was taken the operating room where general anesthetic was administered with the LMA. He was placed in the dorsolithotomy position. Genitalia and perineum were prepped and draped. Proper timeout was then performed.  I passed a 22 French resectoscope sheath using the obturator. I then placed the telescope with the bridge. There was obvious obstruction of the prostate from trilobar hypertrophy. There was a moderate size median lobe. Both ureteral orifices were identified. The bladder stone was identified as well. The patient was placed in the Trendelenburg to allow the stone to move up from the bladder neck. At this point, the [facility] pneumatic device was used to fragment the stone into multiple smaller fragments. We went to approximately 6-7  CO2 cartridges in order to break the stone up. Once adequately fragmented, the stones were irrigated from the bladder. No stone fragments were remaining.  At this point, the resectoscope element and cutting loop were placed. TURP was then performed. A first resected anteriorly and posteriorly at the 12 and 6:00 positions. Median lobe was resected down to the verumontanum which was left intact. Once anterior and posterior tissue were resected, the right and left prostatic lobes were resected down to the surgical capsule. All obstructive tissue was resected, including apical tissue bilaterally. Adequate hemostasis was achieved with the coag setting. At this point, the chips were irrigated from the bladder. Careful inspection of the prostatic fossa was performed, small bleeders were electrocoagulated, and at this point, with adequate hemostasis, the scope was removed. I then passed a 22 French Foley catheter, three-way and filled the balloon with 30 cc of water. This was hooked to CBI.  The patient tolerated procedure well. He was awakened and taken to the PACU in stable condition.

## 2013-05-07 NOTE — Care Management Note (Addendum)
    Page 1 of 1   05/08/2013     12:49:40 PM   CARE MANAGEMENT NOTE 05/08/2013  Patient:  Anthony Hartman, Anthony Hartman   Account Number:  [id number]  Date Initiated:  05/07/2013  Documentation initiated by:  Lanier Clam  Subjective/Objective Assessment:   ADMITTED W/BPH,RINARY RETENTION.     Action/Plan:   FROM HOME W/SPOUSE.HAS PCP,PHARMACY.   Anticipated DC Date:  05/08/2013   Anticipated DC Plan:  HOME/SELF CARE      DC Planning Services  CM consult      Choice offered to / List presented to:             Status of service:  Completed, signed off Medicare Important Message given?   (If response is "NO", the following Medicare IM given date fields will be blank) Date Medicare IM given:   Date Additional Medicare IM given:    Discharge Disposition:  HOME/SELF CARE  Per UR Regulation:  Reviewed for med. necessity/level of care/duration of stay  If discussed at Long Length of Stay Meetings, dates discussed:    Comments:  05/08/13 Tymier Lindholm RN,BSN NCM [phone] D/C HOME NO ORDERS OR NEEDS.  05/07/13 Ozzie Knobel RN BSN NCM [phone] S/P TURP.

## 2013-05-07 NOTE — Anesthesia Postprocedure Evaluation (Signed)
  Anesthesia Post-op Note  Patient: Anthony Hartman  Procedure(s) Performed: Procedure(s) (LRB): TRANSURETHRAL RESECTION OF THE PROSTATE WITH GYRUS INSTRUMENTS (N/A) CYSTOSCOPY WITH LITHOLAPAXY (N/A)  Patient Location: PACU  Anesthesia Type: General  Level of Consciousness: awake and alert   Airway and Oxygen Therapy: Patient Spontanous Breathing  Post-op Pain: mild  Post-op Assessment: Post-op Vital signs reviewed, Patient's Cardiovascular Status Stable, Respiratory Function Stable, Patent Airway and No signs of Nausea or vomiting  Last Vitals:  Filed Vitals:   05/07/13 1417  BP: 108/57  Pulse: 62  Temp: 36.4 C  Resp: 16    Post-op Vital Signs: stable   Complications: No apparent anesthesia complications

## 2013-05-07 NOTE — Transfer of Care (Signed)
Immediate Anesthesia Transfer of Care Note  Patient: Anthony Hartman  Procedure(s) Performed: Procedure(s): TRANSURETHRAL RESECTION OF THE PROSTATE WITH GYRUS INSTRUMENTS (N/A) CYSTOSCOPY WITH LITHOLAPAXY (N/A)  Patient Location: PACU  Anesthesia Type:General  Level of Consciousness: awake, alert , oriented and patient cooperative  Airway & Oxygen Therapy: Patient Spontanous Breathing and Patient connected to face mask oxygen  Post-op Assessment: Report given to PACU RN, Post -op Vital signs reviewed and stable and Patient moving all extremities  Post vital signs: Reviewed and stable  Complications: No apparent anesthesia complications

## 2013-05-07 NOTE — Anesthesia Preprocedure Evaluation (Addendum)
Anesthesia Evaluation  Patient identified by MRN, date of birth, ID band Patient awake    Reviewed: Allergy & Precautions, H&P , NPO status , Patient's Chart, lab work & pertinent test results  History of Anesthesia Complications Negative for: history of anesthetic complications  Airway Mallampati: III TM Distance: >3 FB Neck ROM: Full    Dental no notable dental hx. (+) Edentulous Upper and Edentulous Lower   Pulmonary sleep apnea and Continuous Positive Airway Pressure Ventilation , former smoker,  breath sounds clear to auscultation  Pulmonary exam normal       Cardiovascular hypertension, Pt. on medications + CAD and + Past MI (2012) Rhythm:Regular Rate:Normal     Neuro/Psych negative neurological ROS  negative psych ROS   GI/Hepatic negative GI ROS, Neg liver ROS,   Endo/Other  negative endocrine ROS  Renal/GU negative Renal ROS  negative genitourinary   Musculoskeletal negative musculoskeletal ROS (+)   Abdominal   Peds negative pediatric ROS (+)  Hematology negative hematology ROS (+)   Anesthesia Other Findings   Reproductive/Obstetrics negative OB ROS                          Anesthesia Physical Anesthesia Plan  ASA: III  Anesthesia Plan: General   Post-op Pain Management:    Induction: Intravenous  Airway Management Planned: LMA  Additional Equipment:   Intra-op Plan:   Post-operative Plan:   Informed Consent: I have reviewed the patients History and Physical, chart, labs and discussed the procedure including the risks, benefits and alternatives for the proposed anesthesia with the patient or authorized representative who has indicated his/her understanding and acceptance.   Dental advisory given  Plan Discussed with: CRNA  Anesthesia Plan Comments:         Anesthesia Quick Evaluation

## 2013-05-07 NOTE — Preoperative (Signed)
Beta Blockers   Reason not to administer Beta Blockers:Not Applicabletook med this a.m. 

## 2013-05-07 NOTE — Progress Notes (Signed)
Post-op note  Subjective: The patient is doing well.  No complaints. No spasms  Objective: Vital signs in last 24 hours: Temp:  [97.6 F (36.4 C)-98.5 F (36.9 C)] 97.6 F (36.4 C) (06/26 1417) Pulse Rate:  [55-78] 61 (06/26 1616) Resp:  [7-18] 16 (06/26 1417) BP: (108-158)/(57-87) 116/60 mmHg (06/26 1616) SpO2:  [98 %-100 %] 99 % (06/26 1417) Weight:  [229 lb (103.874 kg)] 229 lb (103.874 kg) (06/26 1152)  Intake/Output from previous day:   Intake/Output this shift: Total I/O In: 5523.8 [P.O.:480; I.V.:2043.8; Other:3000] Out: 6250 [Urine:6250]  Physical Exam:  General: Alert and oriented.  Urine pink w/o clots  Lab Results: No results found for this basename: HGB, HCT,  in the last 72 hours  Assessment/Plan: POD#0 doing well  1) Continue to monitor   Bertram Millard. Kathlen Sakurai, MD   LOS: 0 days   Chelsea Aus 05/07/2013, 6:14 PM

## 2013-05-07 NOTE — Progress Notes (Signed)
ANTIBIOTIC CONSULT NOTE - INITIAL  Pharmacy Consult for Gentamicin Indication: post-op dose x1  Allergies  Allergen Reactions  . Augmentin (Amoxicillin-Pot Clavulanate) Nausea And Vomiting  . Sulfa Antibiotics Nausea And Vomiting    Patient Measurements: Height: 6\' 5"  (195.6 cm) Weight: 229 lb (103.874 kg) IBW/kg (Calculated) : 89.1 Adjusted Body Weight: 95 kg  Vital Signs: Temp: 97.7 F (36.5 C) (06/26 1112) Temp src: Oral (06/26 1112) BP: 119/69 mmHg (06/26 1112) Pulse Rate: 66 (06/26 1112) Intake/Output from previous day:   Intake/Output from this shift: Total I/O In: 2250 [I.V.:1650; Other:600] Out: 1800 [Urine:1800]  Labs:  Recent Labs  05/04/13 1515  WBC 7.0  HGB 14.9  PLT 348  CREATININE 1.03   Estimated Creatinine Clearance: 81.7 ml/min (by C-G formula based on Cr of 1.03). No results found for this basename: VANCOTROUGH, Leodis Binet, VANCORANDOM, GENTTROUGH, GENTPEAK, GENTRANDOM, TOBRATROUGH, TOBRAPEAK, TOBRARND, AMIKACINPEAK, AMIKACINTROU, AMIKACIN,  in the last 72 hours   Microbiology: Recent Results (from the past 720 hour(s))  URINE CULTURE     Status: None   Collection Time    04/20/13 10:33 PM      Result Value Range Status   Specimen Description URINE, CATHETERIZED   Final   Special Requests NONE   Final   Culture  Setup Time 04/21/2013 03:46   Final   Colony Count >=100,000 COLONIES/ML   Final   Culture ESCHERICHIA COLI   Final   Report Status 04/22/2013 FINAL   Final   Organism ID, Bacteria ESCHERICHIA COLI   Final  SURGICAL PCR SCREEN     Status: None   Collection Time    05/04/13  3:05 PM      Result Value Range Status   MRSA, PCR NEGATIVE  NEGATIVE Final   Staphylococcus aureus NEGATIVE  NEGATIVE Final   Comment:            The Xpert SA Assay (FDA     approved for NASAL specimens     in patients over 31 years of age),     is one component of     a comprehensive surveillance     program.  Test performance has     been validated by  [employer] for patients greater     than or equal to 61 year old.     It is not intended     to diagnose infection nor to     guide or monitor treatment.    Medical History: Past Medical History  Diagnosis Date  . Hyperlipidemia   . Arthritis   . History of bladder cancer     CARCINOMA IN SITU OF BLADDER  . BPH (benign prostatic hypertrophy)   . Bladder stone   . Foley catheter in place   . Hypertension   . GERD (gastroesophageal reflux disease)   . Coronary artery disease cardiologist--    DR Mardelle Matte [facility] ([facility])  . History of myocardial infarction     07-17-2011--   MI W/ OCCLUDED OM1  . Complication of anesthesia     "I didn't feel right after endoscopy anesthesia"   . Myocardial infarction 2012    cardiac studies on chart  . History of kidney stones   . OSA (obstructive sleep apnea)     cpap non-compliant   . Anxiety     Assessment: 72 yoM s/p cystolitholapaxy, TURP with gyrus.  Patient received gentamicin pre-op, now pharmacy asked to dose gentamicin for post-op dose x  1.  Pre-op gentamicin dose will cover for 24 hours so will schedule one-time post-op dose the AM of POD1.  Of note, patient was appropriately treated for E.coli UTI earlier this month.   Goal of Therapy:  surgical prophylaxis  Plan:  Gentamicin 480 mg (5 mg/kg of adjusted body weight) IV x 1 at 0600 tomorrow morning.  Clance Boll 05/07/2013,1:18 PM

## 2013-05-08 ENCOUNTER — Encounter (HOSPITAL_COMMUNITY): Payer: Self-pay | Admitting: Urology

## 2013-05-08 LAB — URINE CULTURE

## 2013-05-08 MED ORDER — CEFDINIR 300 MG PO CAPS: 300.0000 mg | ORAL_CAPSULE | Freq: Two times a day (BID) | ORAL | Status: DC

## 2013-05-08 NOTE — Discharge Summary (Signed)
Patient ID: Anthony Hartman MRN: [id number] DOB/AGE: [birth year] 72 y.o.  Admit date: 05/07/2013 Discharge date: 05/08/2013  Primary Care Physician:  [name], MD  Discharge Diagnoses:   BPH, bladder stone  Consults:  None     Discharge Medications:   Medication List    STOP taking these medications       aspirin EC 81 MG tablet     cephALEXin 500 MG capsule  Commonly known as:  KEFLEX     dutasteride 0.5 MG capsule  Commonly known as:  AVODART     silodosin 8 MG Caps capsule  Commonly known as:  RAPAFLO      TAKE these medications       acetaminophen 325 MG tablet  Commonly known as:  TYLENOL  Take 325 mg by mouth every 6 (six) hours as needed for pain.     ALPRAZolam 1 MG tablet  Commonly known as:  XANAX  Take 0.5-1 mg by mouth 3 (three) times daily as needed for anxiety.     atorvastatin 10 MG tablet  Commonly known as:  LIPITOR  Take 10 mg by mouth at bedtime.     carvedilol 6.25 MG tablet  Commonly known as:  COREG  Take 6.25 mg by mouth 2 (two) times daily with a meal.     cefdinir 300 MG capsule  Commonly known as:  OMNICEF  Take 1 capsule (300 mg total) by mouth 2 (two) times daily.     cholecalciferol 400 UNITS Tabs  Commonly known as:  VITAMIN D  Take 400 Units by mouth every morning.     CORTISPORIN 1 % ointment  Generic drug:  bacitracin-neomycin-polymyxin-hydrocortisone  Apply 1 application topically 3 (three) times daily as needed.     diazepam 5 MG tablet  Commonly known as:  VALIUM  Take 5 mg by mouth every 6 (six) hours as needed (For muscle spasms.).     escitalopram 10 MG tablet  Commonly known as:  LEXAPRO  Take 10 mg by mouth at bedtime.     hydrocortisone 2.5 % rectal cream  Commonly known as:  ANUSOL-HC  Place 1 application rectally 4 (four) times daily as needed for hemorrhoids.     magnesium hydroxide 400 MG/5ML suspension  Commonly known as:  MILK OF MAGNESIA  Take 15 mLs by mouth daily as needed for constipation.      MOBIC 15 MG tablet  Generic drug:  meloxicam  Take 15 mg by mouth every morning.     multivitamin with minerals Tabs  Take 1 tablet by mouth every morning.     omeprazole 20 MG capsule  Commonly known as:  PRILOSEC  Take 20 mg by mouth every morning.     oxybutynin 5 MG tablet  Commonly known as:  DITROPAN  Take 5 mg by mouth 3 (three) times daily as needed (For overactive bladder.).     polyethylene glycol packet  Commonly known as:  MIRALAX / GLYCOLAX  Take 17 g by mouth 2 (two) times daily as needed (For constipation.).     sodium phosphate Pediatric 3.5-9.5 GM/59ML enema  Place 1 enema rectally daily as needed for constipation.     URIBEL 118 MG Caps  Take 118 mg by mouth daily as needed (For urinary pain or burning.).         Significant Diagnostic Studies:  No results found.  Brief H and P: For complete details please refer to admission H and P, but in brief the patient was  admitted for surgical management of BPH with retention of the bladder calculus. He was recently admitted for a urinary tract infection which has been adequately treated.  Hospital Course:  Patient was admitted directly to the operating room where he underwent cystolitholapaxy and TURP. He tolerated the procedure well. Postoperative course was uncomplicated, his catheter was removed on postoperative morning #1, and he was discharged after voiding.  Day of Discharge BP 129/74  Pulse 55  Temp(Src) 97.5 F (36.4 C) (Axillary)  Resp 18  Ht 6\' 5"  (1.956 m)  Wt 229 lb (103.874 kg)  BMI 27.15 kg/m2  SpO2 99%  No results found for this or any previous visit (from the past 24 hour(s)).  Physical Exam: General: Alert and awake oriented x3 not in any acute distress. HEENT: anicteric sclera, pupils reactive to light and accommodation CVS: S1-S2 clear no murmur rubs or gallops Chest: clear to auscultation bilaterally, no wheezing rales or rhonchi Abdomen: soft nontender, nondistended, normal  bowel sounds, no organomegaly Extremities: no cyanosis, clubbing or edema noted bilaterally Neuro: Cranial nerves II-XII intact, no focal neurological deficits  Disposition:  Home   Diet:  No restrictions   Activity:  Discussed w/ pt   Disposition and Follow-up:    He will followup in late July to schedule  TESTS THAT NEED FOLLOW-UP  None  DISCHARGE FOLLOW-UP     Follow-up Information   Follow up with Chelsea Aus, MD. (7/30 @ 12:45)    Contact information:   [address] [place] [address] [phone]       Time spent on Discharge:  15 minutes  Signed: Chelsea Aus 05/08/2013, 6:38 AM

## 2014-01-22 ENCOUNTER — Other Ambulatory Visit: Payer: Self-pay | Admitting: Urology

## 2014-01-22 ENCOUNTER — Ambulatory Visit (HOSPITAL_COMMUNITY)
Admission: RE | Admit: 2014-01-22 | Discharge: 2014-01-22 | Disposition: A | Discharge status: Home / Self Care | Payer: Medicare Other | Source: Ambulatory Visit | Attending: Urology | Admitting: Urology

## 2014-01-22 ENCOUNTER — Encounter (HOSPITAL_COMMUNITY): Payer: Self-pay

## 2014-01-22 DIAGNOSIS — N390 Urinary tract infection, site not specified: Secondary | ICD-10-CM | POA: Insufficient documentation

## 2014-01-22 MED ORDER — SODIUM CHLORIDE 0.9 % IJ SOLN
10.0000 mL | Freq: Once | INTRAMUSCULAR | Status: AC
  Administered 2014-01-22: 10 mL via INTRAVENOUS

## 2014-01-22 MED ORDER — DEXTROSE 5 % IV SOLN
600.0000 mg | Freq: Once | INTRAVENOUS | Status: AC
  Administered 2014-01-22: 600 mg via INTRAVENOUS
  Filled 2014-01-22: qty 15

## 2014-01-22 MED ORDER — SODIUM CHLORIDE 0.9 % IV SOLN
250.0000 mL | Freq: Once | INTRAVENOUS | Status: AC
  Administered 2014-01-22: 250 mL via INTRAVENOUS

## 2014-01-22 NOTE — Discharge Instructions (Signed)
Gentamicin solution for injection What is this medicine? GENTAMICIN (jen ta MYE sin) is an aminoglycoside antibiotic. It is used to treat certain kinds of bacterial infections. It will not work for colds, flu, or other viral infections. This medicine may be used for other purposes; ask your health care provider or pharmacist if you have questions. COMMON BRAND NAME(S): Garamycin What should I tell my health care provider before I take this medicine? They need to know if you have any of these conditions: -balance problems -hearing problems -kidney disease -myasthenia gravis -Parkinson's disease -an unusual or allergic reaction to gentamicin, aminoglycosides, other medicines, sulfites, foods, dyes or preservatives -pregnant or trying to get pregnant -breast-feeding How should I use this medicine? This medicine is for injection into a muscle or infusion into a vein. It is usually given by a health care professional in a hospital or clinic setting. If you get this medicine at home, you will be taught how to prepare and give this medicine. Use exactly as directed. Take your medicine at regular intervals. Do not take your medicine more often than directed. Take all of your medicine as directed even if you think you are better. Do not skip doses or stop your medicine early. It is important that you put your used needles and syringes in a special sharps container. Do not put them in a trash can. If you do not have a sharps container, call your pharmacist or healthcare provider to get one. Talk to your pediatrician regarding the use of this medicine in children. Special care may be needed. Overdosage: If you think you have taken too much of this medicine contact a poison control center or emergency room at once. NOTE: This medicine is only for you. Do not share this medicine with others. What if I miss a dose? If you miss a dose, use it as soon as you can. If it is almost time for your next dose, use  only that dose. Do not use double or extra doses. What may interact with this medicine? Do not take this medicine with any of the following medications: -cidofovir This medicine may also interact with the following medications: -acyclovir -cisplatin -colistin -cyclosporine -diuretics -foscarnet -ganciclovir -medicines used during surgery for sleep or muscle relaxation -other antibiotics This list may not describe all possible interactions. Give your health care provider a list of all the medicines, herbs, non-prescription drugs, or dietary supplements you use. Also tell them if you smoke, drink alcohol, or use illegal drugs. Some items may interact with your medicine. What should I watch for while using this medicine? Your condition will be monitored carefully while you are receiving this medicine. Tell your doctor or health care professional if you have any hearing problems or problems passing urine. What side effects may I notice from receiving this medicine? Elderly patients are more likely to get serious side effects. Serious side effects with gentamicin include: -allergic reactions like skin rash, itching or hives, swelling of the face, lips, or tongue -breathing problems -changes in hearing -confused, dizzy, disoriented -fever -loss of balance -muscle twitch -numb, tingling pain -redness, blistering, peeling or loosening of the skin, including inside the mouth -seizures -trouble passing urine or change in the amount of urine -unusual bleeding or bruising -unusually weak or tired Side effects that usually do not require medical attention (report to your doctor or health care professional if they continue or are bothersome): -diarrhea -headache -nausea, vomiting -pain at site where injected This list may not describe all  possible side effects. Call your doctor for medical advice about side effects. You may report side effects to FDA at 1-800-FDA-1088. Where should I keep my  medicine? Keep out of the reach of children. If you are using this medicine at home, you will be instructed on how to store this medicine. Throw away any unused medicine after the expiration date on the label. NOTE: This sheet is a summary. It may not cover all possible information. If you have questions about this medicine, talk to your doctor, pharmacist, or health care provider.  2014, Elsevier/Gold Standard. (2008-03-09 18:37:44)

## 2015-06-28 ENCOUNTER — Other Ambulatory Visit: Payer: Self-pay | Admitting: Orthopaedic Surgery

## 2015-06-28 DIAGNOSIS — M545 Low back pain: Secondary | ICD-10-CM

## 2015-06-29 ENCOUNTER — Ambulatory Visit
Admission: RE | Admit: 2015-06-29 | Discharge: 2015-06-29 | Disposition: A | Discharge status: Home / Self Care | Payer: PPO | Source: Ambulatory Visit | Attending: Orthopaedic Surgery | Admitting: Orthopaedic Surgery

## 2015-06-29 DIAGNOSIS — M545 Low back pain: Secondary | ICD-10-CM

## 2015-11-18 DIAGNOSIS — R42 Dizziness and giddiness: Secondary | ICD-10-CM | POA: Diagnosis not present

## 2015-11-22 DIAGNOSIS — R42 Dizziness and giddiness: Secondary | ICD-10-CM | POA: Diagnosis not present

## 2015-11-24 DIAGNOSIS — R42 Dizziness and giddiness: Secondary | ICD-10-CM | POA: Diagnosis not present

## 2015-11-29 DIAGNOSIS — I10 Essential (primary) hypertension: Secondary | ICD-10-CM | POA: Diagnosis not present

## 2015-11-29 DIAGNOSIS — J209 Acute bronchitis, unspecified: Secondary | ICD-10-CM | POA: Diagnosis not present

## 2015-12-02 DIAGNOSIS — H5203 Hypermetropia, bilateral: Secondary | ICD-10-CM | POA: Diagnosis not present

## 2015-12-05 DIAGNOSIS — M4712 Other spondylosis with myelopathy, cervical region: Secondary | ICD-10-CM | POA: Diagnosis not present

## 2015-12-05 DIAGNOSIS — M4806 Spinal stenosis, lumbar region: Secondary | ICD-10-CM | POA: Diagnosis not present

## 2015-12-05 DIAGNOSIS — M5416 Radiculopathy, lumbar region: Secondary | ICD-10-CM | POA: Diagnosis not present

## 2015-12-05 DIAGNOSIS — G9520 Unspecified cord compression: Secondary | ICD-10-CM | POA: Diagnosis not present

## 2015-12-05 DIAGNOSIS — R269 Unspecified abnormalities of gait and mobility: Secondary | ICD-10-CM | POA: Diagnosis not present

## 2015-12-05 DIAGNOSIS — G959 Disease of spinal cord, unspecified: Secondary | ICD-10-CM | POA: Diagnosis not present

## 2015-12-06 DIAGNOSIS — R42 Dizziness and giddiness: Secondary | ICD-10-CM | POA: Diagnosis not present

## 2015-12-08 DIAGNOSIS — R42 Dizziness and giddiness: Secondary | ICD-10-CM | POA: Diagnosis not present

## 2015-12-09 DIAGNOSIS — N402 Nodular prostate without lower urinary tract symptoms: Secondary | ICD-10-CM | POA: Diagnosis not present

## 2015-12-09 DIAGNOSIS — N400 Enlarged prostate without lower urinary tract symptoms: Secondary | ICD-10-CM | POA: Diagnosis not present

## 2015-12-09 DIAGNOSIS — Z0000 Encounter for general adult medical examination without abnormal findings: Secondary | ICD-10-CM | POA: Diagnosis not present

## 2015-12-09 DIAGNOSIS — E291 Testicular hypofunction: Secondary | ICD-10-CM | POA: Diagnosis not present

## 2015-12-13 DIAGNOSIS — R42 Dizziness and giddiness: Secondary | ICD-10-CM | POA: Diagnosis not present

## 2015-12-15 DIAGNOSIS — R42 Dizziness and giddiness: Secondary | ICD-10-CM | POA: Diagnosis not present

## 2015-12-16 DIAGNOSIS — M5416 Radiculopathy, lumbar region: Secondary | ICD-10-CM | POA: Diagnosis not present

## 2015-12-16 DIAGNOSIS — M4806 Spinal stenosis, lumbar region: Secondary | ICD-10-CM | POA: Diagnosis not present

## 2015-12-27 DIAGNOSIS — M5416 Radiculopathy, lumbar region: Secondary | ICD-10-CM | POA: Diagnosis not present

## 2015-12-27 DIAGNOSIS — M4806 Spinal stenosis, lumbar region: Secondary | ICD-10-CM | POA: Diagnosis not present

## 2016-01-25 DIAGNOSIS — Z0000 Encounter for general adult medical examination without abnormal findings: Secondary | ICD-10-CM | POA: Diagnosis not present

## 2016-01-25 DIAGNOSIS — C61 Malignant neoplasm of prostate: Secondary | ICD-10-CM | POA: Diagnosis not present

## 2016-01-25 DIAGNOSIS — N400 Enlarged prostate without lower urinary tract symptoms: Secondary | ICD-10-CM | POA: Diagnosis not present

## 2016-01-25 DIAGNOSIS — R972 Elevated prostate specific antigen [PSA]: Secondary | ICD-10-CM | POA: Diagnosis not present

## 2016-02-06 DIAGNOSIS — M542 Cervicalgia: Secondary | ICD-10-CM | POA: Diagnosis not present

## 2016-02-06 DIAGNOSIS — R269 Unspecified abnormalities of gait and mobility: Secondary | ICD-10-CM | POA: Diagnosis not present

## 2016-02-06 DIAGNOSIS — M4712 Other spondylosis with myelopathy, cervical region: Secondary | ICD-10-CM | POA: Diagnosis not present

## 2016-02-06 DIAGNOSIS — M4806 Spinal stenosis, lumbar region: Secondary | ICD-10-CM | POA: Diagnosis not present

## 2016-02-06 DIAGNOSIS — M5416 Radiculopathy, lumbar region: Secondary | ICD-10-CM | POA: Diagnosis not present

## 2016-02-06 DIAGNOSIS — G959 Disease of spinal cord, unspecified: Secondary | ICD-10-CM | POA: Diagnosis not present

## 2016-02-06 DIAGNOSIS — G9520 Unspecified cord compression: Secondary | ICD-10-CM | POA: Diagnosis not present

## 2016-02-13 DIAGNOSIS — F330 Major depressive disorder, recurrent, mild: Secondary | ICD-10-CM | POA: Diagnosis not present

## 2016-02-13 DIAGNOSIS — M5090 Cervical disc disorder, unspecified, unspecified cervical region: Secondary | ICD-10-CM | POA: Diagnosis not present

## 2016-02-13 DIAGNOSIS — E291 Testicular hypofunction: Secondary | ICD-10-CM | POA: Diagnosis not present

## 2016-02-13 DIAGNOSIS — Z86008 Personal history of in-situ neoplasm of other site: Secondary | ICD-10-CM | POA: Diagnosis not present

## 2016-02-13 DIAGNOSIS — E782 Mixed hyperlipidemia: Secondary | ICD-10-CM | POA: Diagnosis not present

## 2016-02-13 DIAGNOSIS — N5201 Erectile dysfunction due to arterial insufficiency: Secondary | ICD-10-CM | POA: Diagnosis not present

## 2016-02-13 DIAGNOSIS — R5383 Other fatigue: Secondary | ICD-10-CM | POA: Diagnosis not present

## 2016-02-13 DIAGNOSIS — Z79899 Other long term (current) drug therapy: Secondary | ICD-10-CM | POA: Diagnosis not present

## 2016-02-13 DIAGNOSIS — R5381 Other malaise: Secondary | ICD-10-CM | POA: Diagnosis not present

## 2016-02-13 DIAGNOSIS — I2510 Atherosclerotic heart disease of native coronary artery without angina pectoris: Secondary | ICD-10-CM | POA: Diagnosis not present

## 2016-02-13 DIAGNOSIS — C61 Malignant neoplasm of prostate: Secondary | ICD-10-CM | POA: Diagnosis not present

## 2016-02-13 DIAGNOSIS — N320 Bladder-neck obstruction: Secondary | ICD-10-CM | POA: Diagnosis not present

## 2016-02-13 DIAGNOSIS — I10 Essential (primary) hypertension: Secondary | ICD-10-CM | POA: Diagnosis not present

## 2016-02-15 ENCOUNTER — Ambulatory Visit
Admission: RE | Admit: 2016-02-15 | Discharge: 2016-02-15 | Disposition: A | Discharge status: Home / Self Care | Payer: PPO | Source: Ambulatory Visit | Attending: Neurosurgery | Admitting: Neurosurgery

## 2016-02-15 ENCOUNTER — Other Ambulatory Visit: Payer: Self-pay | Admitting: Neurosurgery

## 2016-02-15 DIAGNOSIS — G959 Disease of spinal cord, unspecified: Secondary | ICD-10-CM

## 2016-02-15 DIAGNOSIS — M542 Cervicalgia: Secondary | ICD-10-CM | POA: Diagnosis not present

## 2016-03-06 DIAGNOSIS — N390 Urinary tract infection, site not specified: Secondary | ICD-10-CM | POA: Diagnosis not present

## 2016-03-06 DIAGNOSIS — Z0000 Encounter for general adult medical examination without abnormal findings: Secondary | ICD-10-CM | POA: Diagnosis not present

## 2016-03-06 DIAGNOSIS — N410 Acute prostatitis: Secondary | ICD-10-CM | POA: Diagnosis not present

## 2016-03-06 DIAGNOSIS — C61 Malignant neoplasm of prostate: Secondary | ICD-10-CM | POA: Diagnosis not present

## 2016-03-06 DIAGNOSIS — D090 Carcinoma in situ of bladder: Secondary | ICD-10-CM | POA: Diagnosis not present

## 2016-03-07 ENCOUNTER — Encounter: Payer: Self-pay | Admitting: Radiation Oncology

## 2016-03-07 NOTE — Progress Notes (Addendum)
GU Location of Tumor / Histology: prostatic adenocarcinoma  If Prostate Cancer, Gleason Score is (3 + 4) and PSA is (6.11) January 2017.   [name] has a complex past urologist history. Complicated by recurrent CIS. 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 PSA continued to increase to 6.11 prompting repeat biopsy with confirmed Gleason 3+4.  Biopsies of prostate from 2/67/12 (if applicable) revealed:   Past/Anticipated interventions by urology, if any: bladder surgery, cystoscopy with biopsy, cystoscopy with fragmentation of bladder calculus, TURP, prostate biopsy, discuss about observation vs. Surgery vs external beam radiation therapy  Past/Anticipated interventions by medical oncology, if any: induction intravesical BCG and maintenance BCG/INF-A on trial completed in October 2006.  Weight changes, if any: unintentional weight loss of 15 lb over the last year. Reports a poor appetite.   Bowel/Bladder complaints, if any: Patient reports that after the biopsy he developed dysuria and foul smelling urine. Reports Alinda Money put him on doxycycline and these symptoms have resolved. Reports urinary frequency and urgency. Reports nocturia x 2. Reports leakage resolved but, he continues to wear a liner for security. Explains nocturia and leakage improved after he began Flomax   Nausea/Vomiting, if any: no  Pain issues, if any:  Reports severe low back pain. Denies bilateral femur or hip pain.   SAFETY ISSUES:  Prior radiation? no  Pacemaker/ICD? no  Possible current pregnancy? no  Is the patient on methotrexate? no  Current Complaints / other details:  75 year old male. Married. Two children. Prostate volume: 24.3 cc. Family hx of lung ca in father and breast ca in mother. Resides near [place]. Enjoys golf.

## 2016-03-08 ENCOUNTER — Encounter: Payer: Self-pay | Admitting: Radiation Oncology

## 2016-03-08 ENCOUNTER — Ambulatory Visit
Admission: RE | Admit: 2016-03-08 | Discharge: 2016-03-08 | Disposition: A | Discharge status: Home / Self Care | Payer: PPO | Source: Ambulatory Visit | Attending: Radiation Oncology | Admitting: Radiation Oncology

## 2016-03-08 VITALS — BP 130/65 | HR 70 | Temp 98.0°F | Resp 16 | Ht 77.0 in | Wt 239.8 lb

## 2016-03-08 DIAGNOSIS — C61 Malignant neoplasm of prostate: Secondary | ICD-10-CM | POA: Diagnosis not present

## 2016-03-08 HISTORY — DX: Malignant neoplasm of prostate: C61

## 2016-03-08 NOTE — Progress Notes (Signed)
Radiation Oncology         [phone] ________________________________  Initial outpatient Consultation  Name: Anthony Hartman MRN: [id number]  Date: 03/08/2016  DOB: 1941-05-06  AO:ZHYQMV,HQIO, MD  Raynelle Bring, MD   REFERRING PHYSICIAN: Raynelle Bring, MD  DIAGNOSIS: The encounter diagnosis was Malignant neoplasm of prostate [facility]). 75 y.o. gentleman with stage T1c adenocarcinoma of the prostate with a Gleason's score of 3+4 and a PSA of 6.11.     ICD-9-CM ICD-10-CM   1. Malignant neoplasm of prostate ([place]) 185 C61     HISTORY OF PRESENT ILLNESS: Anthony Hartman is a 75 y.o. male seen at the request of Dr. Mar Daring for a new diagnosis of prostate cancer. He has been a patient of Dr. Lawanda Cousins since early 2001 and received two courses of BCG treatments for carcinoma in situ of the bladder in 2003-2016 and 2007-2010. He has also undergone TURP due to lower urinary tract symptoms in 2014. His PSA has also been followed and in 2010 he had a biops that showed BCG granuloma. His PSA has been in the 5 range in 2016, however this rose to 6.11 in January 2017.  Biopsy on 01/25/16 revealing adenocarcinoma of 4 cores each with 3+4 Gleason Score. His prostatic volume at the time of biopsy was about 23 cc, and his exam revealed prominence of the right lobe without nodules. He has met with Dr. Alinda Money and is not felt to be an excellent surgical candidate, but rather a possible candidate for external radiotherapy. He comes today to discuss this further with Dr. Tammi Klippel.  PREVIOUS RADIATION THERAPY: No  PAST MEDICAL HISTORY:  Past Medical History  Diagnosis Date  . Hyperlipidemia   . Arthritis   . History of bladder cancer     CARCINOMA IN SITU OF BLADDER  . BPH (benign prostatic hypertrophy)   . Bladder stone   . Foley catheter in place   . Hypertension   . GERD (gastroesophageal reflux disease)   . Coronary artery disease cardiologist--    DR Jonni Sanger [facility]IU ([facility])  .  History of myocardial infarction     07-17-2011--   MI W/ OCCLUDED OM1  . Complication of anesthesia     "I didn't feel right after endoscopy anesthesia"   . Myocardial infarction [facility]) 2012    cardiac studies on chart  . History of kidney stones   . OSA (obstructive sleep apnea)     cpap non-compliant   . Anxiety   . Prostate cancer ([place])   . Bladder cancer ([place])       PAST SURGICAL HISTORY: Past Surgical History  Procedure Laterality Date  . Cardiovascular stress test  04/22/11  . Lumbar decompression foraminotomy l3 -- l5/  removal cyst l4-5 facet joint  08-21-2009    ALSO HAD PRIOR BACK SURG IN 1992  . Cysto/ bladder bx's  X2  2001  / X1  2003  . Cysto/ bladder bx's/ bilateral retrograde ureteropylegram  2003; 2007; 2008;  12-30-2007    CARCINOMA IN SITU OF BLADDER  . Transurethral resection of bladder tumor  01-10-2004  . Transthoracic echocardiogram  07-17-2011  [facility]    MILD TO MODERATE CONCENTRIC LVH/ NORMAL LVSF/ EF 55-60%/ MILD AORTIC STENOSIS  . Cardiac catheterization  07-17-2011  DR ANDY CHIU ([facility])    OCCLUDED OM1 WITH UNSUCCESSFUL PCI ATTEMPT/ MILD TO MODERATE DISEASE ELSEWHERE  . Open tenotomy of flexor 2nd and 3rd right toes  APRIL 2008  .  Transurethral resection of prostate N/A 05/07/2013    Procedure: TRANSURETHRAL RESECTION OF THE PROSTATE WITH GYRUS INSTRUMENTS;  Surgeon: Franchot Gallo, MD;  Location: [facility];  Service: Urology;  Laterality: N/A;  . Cystoscopy with litholapaxy N/A 05/07/2013    Procedure: CYSTOSCOPY WITH LITHOLAPAXY;  Surgeon: Franchot Gallo, MD;  Location: [facility];  Service: Urology;  Laterality: N/A;  . Prostate biopsy      FAMILY HISTORY:  Family History  Problem Relation Age of Onset  . Cancer Father   . Cancer Mother     breast    SOCIAL HISTORY:  Social History   Social History  . Marital Status: Married    Spouse Name: N/A  . Number of Children: N/A  . Years of Education: N/A    Occupational History  . Not on file.   Social History Main Topics  . Smoking status: Former Smoker -- 0.50 packs/day for 50 years    Types: Cigarettes    Quit date: 04/15/2010  . Smokeless tobacco: Current User    Types: Snuff     Comment: occasional  snuff pack  . Alcohol Use: Yes     Comment: social  . Drug Use: No  . Sexual Activity: Yes   Other Topics Concern  . Not on file   Social History Narrative    ALLERGIES: Augmentin; Dexamethasone; Sulfa antibiotics; and Sulfamethoxazole  MEDICATIONS:  Current Outpatient Prescriptions  Medication Sig Dispense Refill  . acetaminophen (TYLENOL) 325 MG tablet Take 325 mg by mouth every 6 (six) hours as needed for pain.    Marland Kitchen ALPRAZolam (XANAX) 1 MG tablet Take 0.5-1 mg by mouth 3 (three) times daily as needed for anxiety.    Marland Kitchen aspirin 81 MG tablet Take 81 mg by mouth daily.    Marland Kitchen atorvastatin (LIPITOR) 10 MG tablet Take 60 mg by mouth at bedtime.     . carvedilol (COREG) 6.25 MG tablet Take 6.25 mg by mouth 2 (two) times daily with a meal.    . cholecalciferol (VITAMIN D) 400 UNITS TABS Take 400 Units by mouth every morning.     Marland Kitchen doxycycline (VIBRAMYCIN) 100 MG capsule Take 100 mg by mouth 2 (two) times daily.    Marland Kitchen escitalopram (LEXAPRO) 10 MG tablet Take 10 mg by mouth at bedtime.     . hydrocortisone (ANUSOL-HC) 2.5 % rectal cream Place 1 application rectally 4 (four) times daily as needed for hemorrhoids.    Marland Kitchen loratadine (CLARITIN) 10 MG tablet Take 10 mg by mouth daily.    . magnesium hydroxide (MILK OF MAGNESIA) 400 MG/5ML suspension Take 15 mLs by mouth daily as needed for constipation.     . meloxicam (MOBIC) 15 MG tablet Take 15 mg by mouth every morning.    . Multiple Vitamin (MULTIVITAMIN WITH MINERALS) TABS Take 1 tablet by mouth every morning.     Marland Kitchen oxybutynin (DITROPAN) 5 MG tablet Take 5 mg by mouth 3 (three) times daily as needed (For overactive bladder.).     Marland Kitchen polyethylene glycol (MIRALAX / GLYCOLAX) packet Take 17  g by mouth 2 (two) times daily as needed (For constipation.).    Marland Kitchen psyllium (METAMUCIL) 58.6 % packet Take 1 packet by mouth as needed.    . sodium phosphate Pediatric (FLEET) 3.5-9.5 GM/59ML enema Place 1 enema rectally daily as needed for constipation.     . tadalafil (CIALIS) 10 MG tablet Take 10 mg by mouth as needed for erectile dysfunction.    . bacitracin-neomycin-polymyxin-hydrocortisone (CORTISPORIN) 1 %  ointment Apply 1 application topically 3 (three) times daily as needed.    Marland Kitchen omeprazole (PRILOSEC) 20 MG capsule Take 20 mg by mouth every morning. Reported on 03/08/2016     No current facility-administered medications for this encounter.    REVIEW OF SYSTEMS:  On review of systems, the patient reports that he is doing well overall. He denies any chest pain, shortness of breath, cough, fevers, chills, night sweats, unintended weight changes. He denies any bowel or bladder disturbances, and denies abdominal pain, nausea or vomiting. After he was treated for prostatitis, his nocturia improved dramatically. His IPSS is 11. He does experience erectile dysfunction. He denies any new musculoskeletal or joint aches or pains. A complete review of systems is obtained and is otherwise negative.    PHYSICAL EXAM:  height is '6\' 5"'  (1.956 m) and weight is 239 lb 12.8 oz (108.773 kg). His oral temperature is 98 F (36.7 C). His blood pressure is 130/65 and his pulse is 70. His respiration is 16 and oxygen saturation is 100%.   Pain Scale 0/10 In general this is a well appearing caucasian male in no acute distress. He is alert and oriented x4 and appropriate throughout the examination. HEENT reveals that the patient is normocephalic, atraumatic. EOMs are intact. PERRLA. Skin is intact without any evidence of gross lesions. Cardiovascular exam reveals a regular rate and rhythm, no clicks rubs or murmurs are auscultated. Chest is clear to auscultation bilaterally. Lymphatic assessment is performed and does  not reveal any adenopathy in the cervical, supraclavicular, axillary, or inguinal chains. Abdomen has active bowel sounds in all quadrants and is intact. The abdomen is soft, non tender, non distended. Lower extremities are negative for pretibial pitting edema, deep calf tenderness, cyanosis or clubbing.   KPS = 100  100 - Normal; no complaints; no evidence of disease. 90   - Able to carry on normal activity; minor signs or symptoms of disease. 80   - Normal activity with effort; some signs or symptoms of disease. 74   - Cares for self; unable to carry on normal activity or to do active work. 60   - Requires occasional assistance, but is able to care for most of his personal needs. 50   - Requires considerable assistance and frequent medical care. 69   - Disabled; requires special care and assistance. 70   - Severely disabled; hospital admission is indicated although death not imminent. 40   - Very sick; hospital admission necessary; active supportive treatment necessary. 10   - Moribund; fatal processes progressing rapidly. 0     - Dead  Karnofsky DA, Abelmann Stanley, Craver LS and Burchenal [facility] [phone]) The use of the nitrogen mustards in the palliative treatment of carcinoma: with particular reference to bronchogenic carcinoma Cancer 1 634-56  LABORATORY DATA:  Lab Results  Component Value Date   WBC 7.0 05/04/2013   HGB 14.9 05/04/2013   HCT 42.8 05/04/2013   MCV 91.5 05/04/2013   PLT 348 05/04/2013   Lab Results  Component Value Date   NA 137 05/04/2013   K 4.5 05/04/2013   CL 103 05/04/2013   CO2 28 05/04/2013   Lab Results  Component Value Date   ALT 36 09/19/2009   AST 34 09/19/2009   ALKPHOS 57 09/19/2009   BILITOT 0.5 09/19/2009     RADIOGRAPHY: Dg Cerv Spine Flex&ext Only  02/15/2016  CLINICAL DATA:  Cervical myelopathy. Sharp pains in each arm when flexing his neck. EXAM: CERVICAL SPINE -  FLEXION AND EXTENSION VIEWS ONLY COMPARISON:  Intraoperative images dated  08/24/2015 and MRI dated 08/02/2015 FINDINGS: The patient has had posterior decompression from C2-3 through C5-6. The patient has anterior osteophytes which fuse C2 and C3 and C3 and C4. There is 2 mm anterolisthesis of C4 on C5 and of C5 on C6 which do not change with flexion or extension. There is slight disc space narrowing at C6-7. Chronic degenerative facet arthritis is present at C4-5 and C5-6, stable. Chronic calcification in the nuchal ligament at C5. IMPRESSION: Degenerative disc and joint disease with postsurgical changes as described. Minimal stable anterolisthesis at C4-5 and C5-6. No abnormal motion with flexion or extension. Electronically Signed   By: Lorriane Shire M.D.   On: 02/15/2016 14:00      IMPRESSION: This gentleman is a 75 yo male with adenocarcinoma of the prostate. His T-Stage, Gleason's Score, and PSA put him into the intermediate risk group  PLAN: Dr. Tammi Klippel reviewed the findings and workup thus far. We discussed the natural history of prostate cancer. We reviewed the the implications of T-stage, Gleason's Score, and PSA on decision-making and outcomes in prostate cancer. We discussed radiation treatment in the management of prostate cancer with regard to the logistics and delivery of external beam radiation treatment as well as the logistics and delivery of prostate brachytherapy. We discussed that given his previous TURP he is not a candidate for prostate seed implant. We then compared and contrasted each of these approaches. The patient expressed interest in external beam radiotherapy, and Dr. Tammi Klippel recommends 51 Gy/40 fractions to the prostate.Dr. Tammi Klippel filled out a patient counseling form for him with relevant treatment diagrams and we retained a copy for our records.   He lives close to [place], so we mentioned that he could also get treated by Dr. Orlene Erm. Dr. Tammi Klippel will talk to Dr. Orlene Erm and will set up an appointment for Anthony Hartman to meet him.  He is  also due for colonscopy and since he has lost 15 pounds of weight unintentionally, we have also suggested that this be performed prior to his radiotherapy treatments.  The above documentation reflects my direct findings during this shared patient visit. Please see the separate note by Dr. Tammi Klippel on this date for the remainder of the patient's plan of care.   Carola Rhine, PAC

## 2016-03-08 NOTE — Progress Notes (Signed)
See progress note under physician encounter. 

## 2016-03-09 ENCOUNTER — Other Ambulatory Visit: Payer: Self-pay | Admitting: Radiation Oncology

## 2016-03-09 DIAGNOSIS — C61 Malignant neoplasm of prostate: Secondary | ICD-10-CM

## 2016-03-15 DIAGNOSIS — C61 Malignant neoplasm of prostate: Secondary | ICD-10-CM | POA: Diagnosis not present

## 2016-03-17 DIAGNOSIS — A932 Colorado tick fever: Secondary | ICD-10-CM | POA: Diagnosis not present

## 2016-03-23 DIAGNOSIS — C61 Malignant neoplasm of prostate: Secondary | ICD-10-CM | POA: Diagnosis not present

## 2016-03-23 DIAGNOSIS — Z510 Encounter for antineoplastic radiation therapy: Secondary | ICD-10-CM | POA: Diagnosis not present

## 2016-03-27 DIAGNOSIS — I10 Essential (primary) hypertension: Secondary | ICD-10-CM | POA: Diagnosis not present

## 2016-03-27 DIAGNOSIS — Z510 Encounter for antineoplastic radiation therapy: Secondary | ICD-10-CM | POA: Diagnosis not present

## 2016-03-27 DIAGNOSIS — I2510 Atherosclerotic heart disease of native coronary artery without angina pectoris: Secondary | ICD-10-CM | POA: Diagnosis not present

## 2016-03-27 DIAGNOSIS — R600 Localized edema: Secondary | ICD-10-CM | POA: Diagnosis not present

## 2016-03-27 DIAGNOSIS — C61 Malignant neoplasm of prostate: Secondary | ICD-10-CM | POA: Diagnosis not present

## 2016-03-29 DIAGNOSIS — L578 Other skin changes due to chronic exposure to nonionizing radiation: Secondary | ICD-10-CM | POA: Diagnosis not present

## 2016-03-29 DIAGNOSIS — R233 Spontaneous ecchymoses: Secondary | ICD-10-CM | POA: Diagnosis not present

## 2016-03-29 DIAGNOSIS — L570 Actinic keratosis: Secondary | ICD-10-CM | POA: Diagnosis not present

## 2016-03-29 DIAGNOSIS — L720 Epidermal cyst: Secondary | ICD-10-CM | POA: Diagnosis not present

## 2016-04-02 DIAGNOSIS — C61 Malignant neoplasm of prostate: Secondary | ICD-10-CM | POA: Diagnosis not present

## 2016-04-02 DIAGNOSIS — Z510 Encounter for antineoplastic radiation therapy: Secondary | ICD-10-CM | POA: Diagnosis not present

## 2016-04-03 DIAGNOSIS — C61 Malignant neoplasm of prostate: Secondary | ICD-10-CM | POA: Diagnosis not present

## 2016-04-04 DIAGNOSIS — C61 Malignant neoplasm of prostate: Secondary | ICD-10-CM | POA: Diagnosis not present

## 2016-04-05 DIAGNOSIS — C61 Malignant neoplasm of prostate: Secondary | ICD-10-CM | POA: Diagnosis not present

## 2016-04-06 DIAGNOSIS — C61 Malignant neoplasm of prostate: Secondary | ICD-10-CM | POA: Diagnosis not present

## 2016-04-09 DIAGNOSIS — C61 Malignant neoplasm of prostate: Secondary | ICD-10-CM | POA: Diagnosis not present

## 2016-04-10 DIAGNOSIS — C61 Malignant neoplasm of prostate: Secondary | ICD-10-CM | POA: Diagnosis not present

## 2016-04-11 DIAGNOSIS — C61 Malignant neoplasm of prostate: Secondary | ICD-10-CM | POA: Diagnosis not present

## 2016-04-12 DIAGNOSIS — C61 Malignant neoplasm of prostate: Secondary | ICD-10-CM | POA: Diagnosis not present

## 2016-04-12 DIAGNOSIS — Z510 Encounter for antineoplastic radiation therapy: Secondary | ICD-10-CM | POA: Diagnosis not present

## 2016-04-13 DIAGNOSIS — C61 Malignant neoplasm of prostate: Secondary | ICD-10-CM | POA: Diagnosis not present

## 2016-04-16 DIAGNOSIS — C61 Malignant neoplasm of prostate: Secondary | ICD-10-CM | POA: Diagnosis not present

## 2016-04-16 DIAGNOSIS — N3040 Irradiation cystitis without hematuria: Secondary | ICD-10-CM | POA: Diagnosis not present

## 2016-04-17 DIAGNOSIS — C61 Malignant neoplasm of prostate: Secondary | ICD-10-CM | POA: Diagnosis not present

## 2016-04-18 DIAGNOSIS — C61 Malignant neoplasm of prostate: Secondary | ICD-10-CM | POA: Diagnosis not present

## 2016-04-19 DIAGNOSIS — C61 Malignant neoplasm of prostate: Secondary | ICD-10-CM | POA: Diagnosis not present

## 2016-04-20 DIAGNOSIS — C61 Malignant neoplasm of prostate: Secondary | ICD-10-CM | POA: Diagnosis not present

## 2016-04-23 DIAGNOSIS — C61 Malignant neoplasm of prostate: Secondary | ICD-10-CM | POA: Diagnosis not present

## 2016-04-24 DIAGNOSIS — C61 Malignant neoplasm of prostate: Secondary | ICD-10-CM | POA: Diagnosis not present

## 2016-04-25 DIAGNOSIS — C61 Malignant neoplasm of prostate: Secondary | ICD-10-CM | POA: Diagnosis not present

## 2016-04-26 DIAGNOSIS — C61 Malignant neoplasm of prostate: Secondary | ICD-10-CM | POA: Diagnosis not present

## 2016-04-27 DIAGNOSIS — C61 Malignant neoplasm of prostate: Secondary | ICD-10-CM | POA: Diagnosis not present

## 2016-04-30 DIAGNOSIS — C61 Malignant neoplasm of prostate: Secondary | ICD-10-CM | POA: Diagnosis not present

## 2016-05-01 DIAGNOSIS — C61 Malignant neoplasm of prostate: Secondary | ICD-10-CM | POA: Diagnosis not present

## 2016-05-02 DIAGNOSIS — C61 Malignant neoplasm of prostate: Secondary | ICD-10-CM | POA: Diagnosis not present

## 2016-05-03 DIAGNOSIS — C61 Malignant neoplasm of prostate: Secondary | ICD-10-CM | POA: Diagnosis not present

## 2016-05-04 DIAGNOSIS — C61 Malignant neoplasm of prostate: Secondary | ICD-10-CM | POA: Diagnosis not present

## 2016-05-07 DIAGNOSIS — R5383 Other fatigue: Secondary | ICD-10-CM | POA: Diagnosis not present

## 2016-05-07 DIAGNOSIS — E119 Type 2 diabetes mellitus without complications: Secondary | ICD-10-CM | POA: Diagnosis not present

## 2016-05-07 DIAGNOSIS — I2510 Atherosclerotic heart disease of native coronary artery without angina pectoris: Secondary | ICD-10-CM | POA: Diagnosis not present

## 2016-05-07 DIAGNOSIS — R5381 Other malaise: Secondary | ICD-10-CM | POA: Diagnosis not present

## 2016-05-07 DIAGNOSIS — F418 Other specified anxiety disorders: Secondary | ICD-10-CM | POA: Diagnosis not present

## 2016-05-07 DIAGNOSIS — E782 Mixed hyperlipidemia: Secondary | ICD-10-CM | POA: Diagnosis not present

## 2016-05-07 DIAGNOSIS — M150 Primary generalized (osteo)arthritis: Secondary | ICD-10-CM | POA: Diagnosis not present

## 2016-05-07 DIAGNOSIS — K219 Gastro-esophageal reflux disease without esophagitis: Secondary | ICD-10-CM | POA: Diagnosis not present

## 2016-05-07 DIAGNOSIS — C61 Malignant neoplasm of prostate: Secondary | ICD-10-CM | POA: Diagnosis not present

## 2016-05-07 DIAGNOSIS — Z79899 Other long term (current) drug therapy: Secondary | ICD-10-CM | POA: Diagnosis not present

## 2016-05-07 DIAGNOSIS — F5101 Primary insomnia: Secondary | ICD-10-CM | POA: Diagnosis not present

## 2016-05-07 DIAGNOSIS — I10 Essential (primary) hypertension: Secondary | ICD-10-CM | POA: Diagnosis not present

## 2016-05-08 DIAGNOSIS — C61 Malignant neoplasm of prostate: Secondary | ICD-10-CM | POA: Diagnosis not present

## 2016-05-09 DIAGNOSIS — C61 Malignant neoplasm of prostate: Secondary | ICD-10-CM | POA: Diagnosis not present

## 2016-05-10 DIAGNOSIS — C61 Malignant neoplasm of prostate: Secondary | ICD-10-CM | POA: Diagnosis not present

## 2016-05-11 DIAGNOSIS — C61 Malignant neoplasm of prostate: Secondary | ICD-10-CM | POA: Diagnosis not present

## 2016-05-14 DIAGNOSIS — Z510 Encounter for antineoplastic radiation therapy: Secondary | ICD-10-CM | POA: Diagnosis not present

## 2016-05-14 DIAGNOSIS — C61 Malignant neoplasm of prostate: Secondary | ICD-10-CM | POA: Diagnosis not present

## 2016-05-16 DIAGNOSIS — C61 Malignant neoplasm of prostate: Secondary | ICD-10-CM | POA: Diagnosis not present

## 2016-05-17 DIAGNOSIS — C61 Malignant neoplasm of prostate: Secondary | ICD-10-CM | POA: Diagnosis not present

## 2016-05-21 DIAGNOSIS — C61 Malignant neoplasm of prostate: Secondary | ICD-10-CM | POA: Diagnosis not present

## 2016-05-22 DIAGNOSIS — C61 Malignant neoplasm of prostate: Secondary | ICD-10-CM | POA: Diagnosis not present

## 2016-05-23 DIAGNOSIS — C61 Malignant neoplasm of prostate: Secondary | ICD-10-CM | POA: Diagnosis not present

## 2016-05-24 DIAGNOSIS — C61 Malignant neoplasm of prostate: Secondary | ICD-10-CM | POA: Diagnosis not present

## 2016-05-25 DIAGNOSIS — C61 Malignant neoplasm of prostate: Secondary | ICD-10-CM | POA: Diagnosis not present

## 2016-05-28 DIAGNOSIS — C61 Malignant neoplasm of prostate: Secondary | ICD-10-CM | POA: Diagnosis not present

## 2016-05-29 DIAGNOSIS — C61 Malignant neoplasm of prostate: Secondary | ICD-10-CM | POA: Diagnosis not present

## 2016-05-30 DIAGNOSIS — Z79899 Other long term (current) drug therapy: Secondary | ICD-10-CM | POA: Diagnosis not present

## 2016-05-30 DIAGNOSIS — E119 Type 2 diabetes mellitus without complications: Secondary | ICD-10-CM | POA: Diagnosis not present

## 2016-05-30 DIAGNOSIS — M150 Primary generalized (osteo)arthritis: Secondary | ICD-10-CM | POA: Diagnosis not present

## 2016-06-01 ENCOUNTER — Ambulatory Visit (HOSPITAL_COMMUNITY)
Admission: EM | Admit: 2016-06-01 | Discharge: 2016-06-01 | Disposition: A | Discharge status: Home / Self Care | Payer: PPO | Attending: Family Medicine | Admitting: Family Medicine

## 2016-06-01 ENCOUNTER — Encounter (HOSPITAL_COMMUNITY): Payer: Self-pay | Admitting: Emergency Medicine

## 2016-06-01 DIAGNOSIS — R0789 Other chest pain: Secondary | ICD-10-CM | POA: Diagnosis not present

## 2016-06-01 MED ORDER — KETOROLAC TROMETHAMINE 30 MG/ML IJ SOLN
30.0000 mg | Freq: Once | INTRAMUSCULAR | Status: AC
  Administered 2016-06-01: 30 mg via INTRAMUSCULAR

## 2016-06-01 MED ORDER — NAPROXEN 500 MG PO TABS: 500.0000 mg | ORAL_TABLET | Freq: Two times a day (BID) | ORAL | Status: DC

## 2016-06-01 MED ORDER — KETOROLAC TROMETHAMINE 30 MG/ML IJ SOLN
INTRAMUSCULAR | Status: AC
  Filled 2016-06-01: qty 1

## 2016-06-01 NOTE — ED Provider Notes (Signed)
CSN: [id number]     Arrival date & time 06/01/16  1446 History   None    Chief Complaint  Patient presents with  . Chest Pain   (Consider location/radiation/quality/duration/timing/severity/associated sxs/prior Treatment) Patient is a 75 y.o. male presenting with chest pain. The history is provided by the patient.  Chest Pain Pain location:  L chest Pain quality: aching   Pain radiates to:  Does not radiate Pain radiates to the back: no   Pain severity:  Moderate Onset quality:  Sudden Duration:  1 week Timing:  Intermittent Progression:  Waxing and waning Context: movement and raising an arm   Relieved by:  Rest Worsened by:  Nothing tried Ineffective treatments:  None tried   Past Medical History  Diagnosis Date  . Hyperlipidemia   . Arthritis   . History of bladder cancer     CARCINOMA IN SITU OF BLADDER  . BPH (benign prostatic hypertrophy)   . Bladder stone   . Foley catheter in place   . Hypertension   . GERD (gastroesophageal reflux disease)   . Coronary artery disease cardiologist--    DR Jonni Sanger [facility] ([facility])  . History of myocardial infarction     07-17-2011--   MI W/ OCCLUDED OM1  . Complication of anesthesia     "I didn't feel right after endoscopy anesthesia"   . Myocardial infarction [facility]) 2012    cardiac studies on chart  . History of kidney stones   . OSA (obstructive sleep apnea)     cpap non-compliant   . Anxiety   . Prostate cancer ([place])   . Bladder cancer [facility])    Past Surgical History  Procedure Laterality Date  . Cardiovascular stress test  04/22/11  . Lumbar decompression foraminotomy l3 -- l5/  removal cyst l4-5 facet joint  08-21-2009    ALSO HAD PRIOR BACK SURG IN 1992  . Cysto/ bladder bx's  X2  2001  / X1  2003  . Cysto/ bladder bx's/ bilateral retrograde ureteropylegram  2003; 2007; 2008;  12-30-2007    CARCINOMA IN SITU OF BLADDER  . Transurethral resection of bladder tumor  01-10-2004  . Transthoracic  echocardiogram  07-17-2011  [facility]    MILD TO MODERATE CONCENTRIC LVH/ NORMAL LVSF/ EF 55-60%/ MILD AORTIC STENOSIS  . Cardiac catheterization  07-17-2011  DR ANDY CHIU ([facility])    OCCLUDED OM1 WITH UNSUCCESSFUL PCI ATTEMPT/ MILD TO MODERATE DISEASE ELSEWHERE  . Open tenotomy of flexor 2nd and 3rd right toes  APRIL 2008  . Transurethral resection of prostate N/A 05/07/2013    Procedure: TRANSURETHRAL RESECTION OF THE PROSTATE WITH GYRUS INSTRUMENTS;  Surgeon: Franchot Gallo, MD;  Location: [facility];  Service: Urology;  Laterality: N/A;  . Cystoscopy with litholapaxy N/A 05/07/2013    Procedure: CYSTOSCOPY WITH LITHOLAPAXY;  Surgeon: Franchot Gallo, MD;  Location: [facility];  Service: Urology;  Laterality: N/A;  . Prostate biopsy     Family History  Problem Relation Age of Onset  . Cancer Father   . Cancer Mother     breast   Social History  Substance Use Topics  . Smoking status: Former Smoker -- 0.50 packs/day for 50 years    Types: Cigarettes    Quit date: 04/15/2010  . Smokeless tobacco: Current User    Types: Snuff     Comment: occasional  snuff pack  . Alcohol Use: Yes     Comment: social    Review of Systems  Constitutional: Negative.   HENT: Negative.   Eyes: Negative.   Respiratory: Negative.   Cardiovascular: Positive for chest pain.  Gastrointestinal: Negative.   Endocrine: Negative.   Genitourinary: Negative.   Musculoskeletal: Positive for myalgias.  Skin: Negative.   Allergic/Immunologic: Negative.   Neurological: Negative.   Hematological: Negative.   Psychiatric/Behavioral: Negative.     Allergies  Augmentin; Dexamethasone; Sulfa antibiotics; and Sulfamethoxazole  Home Medications   Prior to Admission medications   Medication Sig Start Date End Date Taking? Authorizing Provider  acetaminophen (TYLENOL) 325 MG tablet Take 325 mg by mouth every 6 (six) hours as needed for pain.   Yes Historical Provider, MD  ALPRAZolam  Duanne Moron) 1 MG tablet Take 0.5-1 mg by mouth 3 (three) times daily as needed for anxiety.   Yes Historical Provider, MD  aspirin 81 MG tablet Take 81 mg by mouth daily.   Yes Historical Provider, MD  atorvastatin (LIPITOR) 10 MG tablet Take 60 mg by mouth at bedtime.    Yes Historical Provider, MD  carvedilol (COREG) 6.25 MG tablet Take 6.25 mg by mouth 2 (two) times daily with a meal.   Yes Historical Provider, MD  cholecalciferol (VITAMIN D) 400 UNITS TABS Take 400 Units by mouth every morning.    Yes Historical Provider, MD  meloxicam (MOBIC) 15 MG tablet Take 15 mg by mouth every morning.   Yes Historical Provider, MD  Multiple Vitamin (MULTIVITAMIN WITH MINERALS) TABS Take 1 tablet by mouth every morning.    Yes Historical Provider, MD  omeprazole (PRILOSEC) 20 MG capsule Take 20 mg by mouth every morning. Reported on 03/08/2016   Yes Historical Provider, MD  oxybutynin (DITROPAN) 5 MG tablet Take 5 mg by mouth 3 (three) times daily as needed (For overactive bladder.).    Yes Historical Provider, MD  polyethylene glycol (MIRALAX / GLYCOLAX) packet Take 17 g by mouth 2 (two) times daily as needed (For constipation.).   Yes Historical Provider, MD  psyllium (METAMUCIL) 58.6 % packet Take 1 packet by mouth as needed.   Yes Historical Provider, MD  bacitracin-neomycin-polymyxin-hydrocortisone (CORTISPORIN) 1 % ointment Apply 1 application topically 3 (three) times daily as needed.    Historical Provider, MD  doxycycline (VIBRAMYCIN) 100 MG capsule Take 100 mg by mouth 2 (two) times daily.    Historical Provider, MD  escitalopram (LEXAPRO) 10 MG tablet Take 10 mg by mouth at bedtime.     Historical Provider, MD  hydrocortisone (ANUSOL-HC) 2.5 % rectal cream Place 1 application rectally 4 (four) times daily as needed for hemorrhoids.    Historical Provider, MD  loratadine (CLARITIN) 10 MG tablet Take 10 mg by mouth daily.    Historical Provider, MD  magnesium hydroxide (MILK OF MAGNESIA) 400 MG/5ML  suspension Take 15 mLs by mouth daily as needed for constipation.     Historical Provider, MD  sodium phosphate Pediatric (FLEET) 3.5-9.5 GM/59ML enema Place 1 enema rectally daily as needed for constipation.     Historical Provider, MD  tadalafil (CIALIS) 10 MG tablet Take 10 mg by mouth as needed for erectile dysfunction.    Historical Provider, MD   Meds Ordered and Administered this Visit  Medications - No data to display  BP 125/65 mmHg  Pulse 61  Temp(Src) 97 F (36.1 C) (Oral)  Resp 16  SpO2 98% No data found.   Physical Exam  Constitutional: He is oriented to person, place, and time. He appears well-developed and well-nourished.  HENT:  Head: Normocephalic.  Right Ear:  External ear normal.  Left Ear: External ear normal.  Mouth/Throat: Oropharynx is clear and moist.  Eyes: Conjunctivae and EOM are normal. Pupils are equal, round, and reactive to light.  Neck: Normal range of motion. Neck supple.  Cardiovascular: Normal rate, regular rhythm and normal heart sounds.   Pulmonary/Chest: Effort normal and breath sounds normal.  Abdominal: Soft. Bowel sounds are normal.  Musculoskeletal: Normal range of motion. He exhibits tenderness.  TTP left upper chest wall and intercostal space.  Neurological: He is alert and oriented to person, place, and time.    ED Course  Procedures (including critical care time)  Labs Review Labs Reviewed - No data to display  Imaging Review No results found.   Visual Acuity Review  Right Eye Distance:   Left Eye Distance:   Bilateral Distance:    Right Eye Near:   Left Eye Near:    Bilateral Near:         MDM   Left sided chest wall pain - EKG SB with out acute ST-T changes Toradol '30mg'$  IM Naprosyn '500mg'$  one po bid prn #20 Continue with rx'd hydrocodone for pain. Follow up with PCP prn   Lysbeth Penner, FNP 06/01/16 [place], [place] 06/01/16 [phone]

## 2016-06-01 NOTE — ED Notes (Signed)
The patient presented to the [facility] with a complaint of left sided chest pain that occurred after lifting weights 1 week ago. The patient stated that he has seen his PCP and was prescribed a muscle relaxer and hydrocodone but it has not helped.

## 2016-06-01 NOTE — Discharge Instructions (Signed)

## 2016-06-08 DIAGNOSIS — C61 Malignant neoplasm of prostate: Secondary | ICD-10-CM | POA: Diagnosis not present

## 2016-06-08 DIAGNOSIS — C670 Malignant neoplasm of trigone of bladder: Secondary | ICD-10-CM | POA: Diagnosis not present

## 2016-06-08 DIAGNOSIS — N358 Other urethral stricture: Secondary | ICD-10-CM | POA: Diagnosis not present

## 2016-06-08 DIAGNOSIS — N210 Calculus in bladder: Secondary | ICD-10-CM | POA: Diagnosis not present

## 2016-06-12 DIAGNOSIS — H2513 Age-related nuclear cataract, bilateral: Secondary | ICD-10-CM | POA: Diagnosis not present

## 2016-06-12 DIAGNOSIS — H52223 Regular astigmatism, bilateral: Secondary | ICD-10-CM | POA: Diagnosis not present

## 2016-07-11 DIAGNOSIS — C61 Malignant neoplasm of prostate: Secondary | ICD-10-CM | POA: Diagnosis not present

## 2016-07-13 DIAGNOSIS — D519 Vitamin B12 deficiency anemia, unspecified: Secondary | ICD-10-CM | POA: Diagnosis not present

## 2016-07-13 DIAGNOSIS — D649 Anemia, unspecified: Secondary | ICD-10-CM | POA: Diagnosis not present

## 2016-07-17 DIAGNOSIS — I2510 Atherosclerotic heart disease of native coronary artery without angina pectoris: Secondary | ICD-10-CM | POA: Diagnosis not present

## 2016-07-17 DIAGNOSIS — I10 Essential (primary) hypertension: Secondary | ICD-10-CM | POA: Diagnosis not present

## 2016-07-17 DIAGNOSIS — I252 Old myocardial infarction: Secondary | ICD-10-CM | POA: Diagnosis not present

## 2016-07-17 DIAGNOSIS — M791 Myalgia: Secondary | ICD-10-CM | POA: Diagnosis not present

## 2016-07-17 DIAGNOSIS — Z789 Other specified health status: Secondary | ICD-10-CM | POA: Diagnosis not present

## 2016-07-17 DIAGNOSIS — G959 Disease of spinal cord, unspecified: Secondary | ICD-10-CM | POA: Diagnosis not present

## 2016-07-17 DIAGNOSIS — E785 Hyperlipidemia, unspecified: Secondary | ICD-10-CM | POA: Diagnosis not present

## 2016-08-22 DIAGNOSIS — Z79899 Other long term (current) drug therapy: Secondary | ICD-10-CM | POA: Diagnosis not present

## 2016-08-22 DIAGNOSIS — I10 Essential (primary) hypertension: Secondary | ICD-10-CM | POA: Diagnosis not present

## 2016-08-22 DIAGNOSIS — F5101 Primary insomnia: Secondary | ICD-10-CM | POA: Diagnosis not present

## 2016-08-22 DIAGNOSIS — M170 Bilateral primary osteoarthritis of knee: Secondary | ICD-10-CM | POA: Diagnosis not present

## 2016-08-22 DIAGNOSIS — E119 Type 2 diabetes mellitus without complications: Secondary | ICD-10-CM | POA: Diagnosis not present

## 2016-08-22 DIAGNOSIS — I2510 Atherosclerotic heart disease of native coronary artery without angina pectoris: Secondary | ICD-10-CM | POA: Diagnosis not present

## 2016-08-24 DIAGNOSIS — M170 Bilateral primary osteoarthritis of knee: Secondary | ICD-10-CM | POA: Diagnosis not present

## 2016-08-24 DIAGNOSIS — M179 Osteoarthritis of knee, unspecified: Secondary | ICD-10-CM | POA: Diagnosis not present

## 2016-09-03 DIAGNOSIS — Z23 Encounter for immunization: Secondary | ICD-10-CM | POA: Diagnosis not present

## 2016-09-03 DIAGNOSIS — M48061 Spinal stenosis, lumbar region without neurogenic claudication: Secondary | ICD-10-CM | POA: Diagnosis not present

## 2016-09-06 DIAGNOSIS — M48061 Spinal stenosis, lumbar region without neurogenic claudication: Secondary | ICD-10-CM | POA: Diagnosis not present

## 2016-09-07 DIAGNOSIS — M48061 Spinal stenosis, lumbar region without neurogenic claudication: Secondary | ICD-10-CM | POA: Diagnosis not present

## 2016-09-07 DIAGNOSIS — M5126 Other intervertebral disc displacement, lumbar region: Secondary | ICD-10-CM | POA: Diagnosis not present

## 2016-09-10 DIAGNOSIS — C61 Malignant neoplasm of prostate: Secondary | ICD-10-CM | POA: Diagnosis not present

## 2016-09-10 DIAGNOSIS — E785 Hyperlipidemia, unspecified: Secondary | ICD-10-CM | POA: Diagnosis not present

## 2016-09-10 DIAGNOSIS — N210 Calculus in bladder: Secondary | ICD-10-CM | POA: Diagnosis not present

## 2016-09-10 DIAGNOSIS — C670 Malignant neoplasm of trigone of bladder: Secondary | ICD-10-CM | POA: Diagnosis not present

## 2016-09-12 DIAGNOSIS — I2510 Atherosclerotic heart disease of native coronary artery without angina pectoris: Secondary | ICD-10-CM | POA: Diagnosis not present

## 2016-09-12 DIAGNOSIS — I10 Essential (primary) hypertension: Secondary | ICD-10-CM | POA: Diagnosis not present

## 2016-09-12 DIAGNOSIS — R5381 Other malaise: Secondary | ICD-10-CM | POA: Diagnosis not present

## 2016-09-12 DIAGNOSIS — E782 Mixed hyperlipidemia: Secondary | ICD-10-CM | POA: Diagnosis not present

## 2016-09-12 DIAGNOSIS — C61 Malignant neoplasm of prostate: Secondary | ICD-10-CM | POA: Diagnosis not present

## 2016-09-12 DIAGNOSIS — M150 Primary generalized (osteo)arthritis: Secondary | ICD-10-CM | POA: Diagnosis not present

## 2016-09-12 DIAGNOSIS — E119 Type 2 diabetes mellitus without complications: Secondary | ICD-10-CM | POA: Diagnosis not present

## 2016-09-12 DIAGNOSIS — Z79899 Other long term (current) drug therapy: Secondary | ICD-10-CM | POA: Diagnosis not present

## 2016-09-12 DIAGNOSIS — K219 Gastro-esophageal reflux disease without esophagitis: Secondary | ICD-10-CM | POA: Diagnosis not present

## 2016-09-12 DIAGNOSIS — R5383 Other fatigue: Secondary | ICD-10-CM | POA: Diagnosis not present

## 2016-09-12 HISTORY — PX: CORONARY STENT INTERVENTION: CATH118234

## 2016-09-12 HISTORY — PX: LEFT HEART CATH AND CORONARY ANGIOGRAPHY: CATH118249

## 2016-09-17 ENCOUNTER — Other Ambulatory Visit: Payer: Self-pay | Admitting: Neurological Surgery

## 2016-09-17 DIAGNOSIS — M48061 Spinal stenosis, lumbar region without neurogenic claudication: Secondary | ICD-10-CM | POA: Diagnosis not present

## 2016-09-19 DIAGNOSIS — Z01818 Encounter for other preprocedural examination: Secondary | ICD-10-CM | POA: Diagnosis not present

## 2016-09-19 DIAGNOSIS — H25811 Combined forms of age-related cataract, right eye: Secondary | ICD-10-CM | POA: Diagnosis not present

## 2016-09-19 DIAGNOSIS — H2513 Age-related nuclear cataract, bilateral: Secondary | ICD-10-CM | POA: Diagnosis not present

## 2016-09-19 DIAGNOSIS — H2511 Age-related nuclear cataract, right eye: Secondary | ICD-10-CM | POA: Diagnosis not present

## 2016-10-01 ENCOUNTER — Encounter (HOSPITAL_COMMUNITY)
Admission: RE | Admit: 2016-10-01 | Discharge: 2016-10-01 | Disposition: A | Discharge status: Home / Self Care | Payer: PPO | Source: Ambulatory Visit | Attending: Neurological Surgery | Admitting: Neurological Surgery

## 2016-10-01 ENCOUNTER — Ambulatory Visit (HOSPITAL_COMMUNITY)
Admission: RE | Admit: 2016-10-01 | Discharge: 2016-10-01 | Disposition: A | Discharge status: Home / Self Care | Payer: PPO | Source: Ambulatory Visit | Attending: Neurological Surgery | Admitting: Neurological Surgery

## 2016-10-01 ENCOUNTER — Encounter (HOSPITAL_COMMUNITY): Payer: Self-pay

## 2016-10-01 DIAGNOSIS — Z01812 Encounter for preprocedural laboratory examination: Secondary | ICD-10-CM | POA: Insufficient documentation

## 2016-10-01 DIAGNOSIS — M48061 Spinal stenosis, lumbar region without neurogenic claudication: Secondary | ICD-10-CM | POA: Diagnosis not present

## 2016-10-01 DIAGNOSIS — Z01818 Encounter for other preprocedural examination: Secondary | ICD-10-CM | POA: Insufficient documentation

## 2016-10-01 HISTORY — DX: Depression, unspecified: F32.A

## 2016-10-01 HISTORY — DX: Major depressive disorder, single episode, unspecified: F32.9

## 2016-10-01 LAB — CBC WITH DIFFERENTIAL/PLATELET
Basophils Absolute: 0 10*3/uL (ref 0.0–0.1)
Basophils Relative: 1 %
Eosinophils Absolute: 0.2 10*3/uL (ref 0.0–0.7)
Eosinophils Relative: 3 %
HCT: 41.8 % (ref 39.0–52.0)
Hemoglobin: 14.4 g/dL (ref 13.0–17.0)
Lymphocytes Relative: 13 %
Lymphs Abs: 0.7 10*3/uL (ref 0.7–4.0)
MCH: 32.1 pg (ref 26.0–34.0)
MCHC: 34.4 g/dL (ref 30.0–36.0)
MCV: 93.1 fL (ref 78.0–100.0)
Monocytes Absolute: 0.7 10*3/uL (ref 0.1–1.0)
Monocytes Relative: 14 %
Neutro Abs: 3.6 10*3/uL (ref 1.7–7.7)
Neutrophils Relative %: 69 %
Platelets: 238 10*3/uL (ref 150–400)
RBC: 4.49 MIL/uL (ref 4.22–5.81)
RDW: 12.2 % (ref 11.5–15.5)
WBC: 5.2 10*3/uL (ref 4.0–10.5)

## 2016-10-01 LAB — SURGICAL PCR SCREEN
MRSA, PCR: NEGATIVE
Staphylococcus aureus: NEGATIVE

## 2016-10-01 LAB — BASIC METABOLIC PANEL
Anion gap: 6 (ref 5–15)
BUN: 22 mg/dL — ABNORMAL HIGH (ref 6–20)
CO2: 26 mmol/L (ref 22–32)
Calcium: 9.5 mg/dL (ref 8.9–10.3)
Chloride: 105 mmol/L (ref 101–111)
Creatinine, Ser: 1.06 mg/dL (ref 0.61–1.24)
GFR calc Af Amer: >60 mL/min (ref >60)
GFR calc non Af Amer: >60 mL/min (ref >60)
Glucose, Bld: 109 mg/dL — ABNORMAL HIGH (ref 65–99)
Potassium: 4.4 mmol/L (ref 3.5–5.1)
Sodium: 137 mmol/L (ref 135–145)

## 2016-10-01 LAB — PROTIME-INR
INR: 1.06
Prothrombin Time: 13.8 seconds (ref 11.4–15.2)

## 2016-10-01 MED ORDER — CHLORHEXIDINE GLUCONATE CLOTH 2 % EX PADS: 6.0000 | MEDICATED_PAD | Freq: Once | CUTANEOUS | Status: DC

## 2016-10-01 NOTE — Pre-Procedure Instructions (Signed)
    HARNOOR KOHLES  10/01/2016      [id number] [place] [facility] [id number]: [phone]: [phone] [facility] [address] [address] [place] [place]Phone: [phone]: [phone] [address] [place] [address]Phone: [phone]: [phone]   Your procedure is scheduled on 10/08/16.  [facility]  Call this number if you have problems the morning of surgery:  [phone]   Remember:  Do not eat food or drink liquids after midnight.  Take these medicines the morning of surgery with A SIP OF WATER --xanax,amlodipine,carvedilol,prilosec   Do not wear jewelry, make-up or nail polish.  Do not wear lotions, powders, or perfumes, or deoderant.  Do not shave 48 hours prior to surgery.  Men may shave face and neck.  Do not bring valuables to the hospital.  [facility] belongings or [facility]ables.  Contacts, dentures or bridgework may not be worn into surgery.  Leave your suitcase in the car.  After surgery it may be brought to your room.  For patients admitted to the hospital, discharge time will be determined by your treatment team.  Patients discharged the day of surgery will not be allowed to drive home.   Name and phone number of your driver:   Special instructions: Do not take any aspirin,anti-inflammatories,vitamins,or herbal supplements 5-7 days prior to surgery.  Please read over the following fact sheets that you were given. MRSA Information

## 2016-10-05 MED ORDER — SODIUM CHLORIDE 0.9 % IV SOLN
1500.0000 mg | INTRAVENOUS | Status: DC
  Filled 2016-10-05: qty 1500

## 2016-10-08 ENCOUNTER — Ambulatory Visit (HOSPITAL_COMMUNITY)
Admission: RE | Admit: 2016-10-08 | Discharge: 2016-10-08 | Disposition: A | Discharge status: Home / Self Care | Payer: PPO | Source: Ambulatory Visit | Attending: Neurological Surgery | Admitting: Neurological Surgery

## 2016-10-08 ENCOUNTER — Encounter (HOSPITAL_COMMUNITY): Payer: Self-pay | Admitting: *Deleted

## 2016-10-08 ENCOUNTER — Ambulatory Visit (HOSPITAL_COMMUNITY): Payer: PPO | Admitting: Certified Registered Nurse Anesthetist

## 2016-10-08 ENCOUNTER — Encounter (HOSPITAL_COMMUNITY)
Admission: RE | Disposition: A | Discharge status: Home / Self Care | Payer: Self-pay | Source: Ambulatory Visit | Attending: Neurological Surgery

## 2016-10-08 DIAGNOSIS — Z888 Allergy status to other drugs, medicaments and biological substances status: Secondary | ICD-10-CM | POA: Diagnosis not present

## 2016-10-08 DIAGNOSIS — N400 Enlarged prostate without lower urinary tract symptoms: Secondary | ICD-10-CM | POA: Diagnosis not present

## 2016-10-08 DIAGNOSIS — M48061 Spinal stenosis, lumbar region without neurogenic claudication: Secondary | ICD-10-CM | POA: Diagnosis not present

## 2016-10-08 DIAGNOSIS — R61 Generalized hyperhidrosis: Secondary | ICD-10-CM | POA: Diagnosis not present

## 2016-10-08 DIAGNOSIS — Z87442 Personal history of urinary calculi: Secondary | ICD-10-CM | POA: Insufficient documentation

## 2016-10-08 DIAGNOSIS — Z8551 Personal history of malignant neoplasm of bladder: Secondary | ICD-10-CM | POA: Diagnosis not present

## 2016-10-08 DIAGNOSIS — I119 Hypertensive heart disease without heart failure: Secondary | ICD-10-CM | POA: Diagnosis not present

## 2016-10-08 DIAGNOSIS — I2582 Chronic total occlusion of coronary artery: Secondary | ICD-10-CM | POA: Diagnosis not present

## 2016-10-08 DIAGNOSIS — G4733 Obstructive sleep apnea (adult) (pediatric): Secondary | ICD-10-CM | POA: Diagnosis not present

## 2016-10-08 DIAGNOSIS — I213 ST elevation (STEMI) myocardial infarction of unspecified site: Secondary | ICD-10-CM | POA: Diagnosis not present

## 2016-10-08 DIAGNOSIS — F419 Anxiety disorder, unspecified: Secondary | ICD-10-CM | POA: Diagnosis not present

## 2016-10-08 DIAGNOSIS — I10 Essential (primary) hypertension: Secondary | ICD-10-CM | POA: Insufficient documentation

## 2016-10-08 DIAGNOSIS — I2510 Atherosclerotic heart disease of native coronary artery without angina pectoris: Secondary | ICD-10-CM | POA: Insufficient documentation

## 2016-10-08 DIAGNOSIS — E785 Hyperlipidemia, unspecified: Secondary | ICD-10-CM | POA: Insufficient documentation

## 2016-10-08 DIAGNOSIS — I252 Old myocardial infarction: Secondary | ICD-10-CM | POA: Diagnosis not present

## 2016-10-08 DIAGNOSIS — F329 Major depressive disorder, single episode, unspecified: Secondary | ICD-10-CM | POA: Diagnosis not present

## 2016-10-08 DIAGNOSIS — Z87891 Personal history of nicotine dependence: Secondary | ICD-10-CM | POA: Insufficient documentation

## 2016-10-08 DIAGNOSIS — M4800 Spinal stenosis, site unspecified: Secondary | ICD-10-CM | POA: Diagnosis not present

## 2016-10-08 DIAGNOSIS — K219 Gastro-esophageal reflux disease without esophagitis: Secondary | ICD-10-CM | POA: Diagnosis not present

## 2016-10-08 DIAGNOSIS — Z882 Allergy status to sulfonamides status: Secondary | ICD-10-CM | POA: Diagnosis not present

## 2016-10-08 DIAGNOSIS — Z7982 Long term (current) use of aspirin: Secondary | ICD-10-CM | POA: Insufficient documentation

## 2016-10-08 DIAGNOSIS — Z8546 Personal history of malignant neoplasm of prostate: Secondary | ICD-10-CM | POA: Diagnosis not present

## 2016-10-08 DIAGNOSIS — I2111 ST elevation (STEMI) myocardial infarction involving right coronary artery: Secondary | ICD-10-CM | POA: Diagnosis not present

## 2016-10-08 DIAGNOSIS — I25118 Atherosclerotic heart disease of native coronary artery with other forms of angina pectoris: Secondary | ICD-10-CM | POA: Diagnosis not present

## 2016-10-08 DIAGNOSIS — Z803 Family history of malignant neoplasm of breast: Secondary | ICD-10-CM | POA: Insufficient documentation

## 2016-10-08 DIAGNOSIS — Z880 Allergy status to penicillin: Secondary | ICD-10-CM | POA: Insufficient documentation

## 2016-10-08 DIAGNOSIS — Z79899 Other long term (current) drug therapy: Secondary | ICD-10-CM | POA: Insufficient documentation

## 2016-10-08 DIAGNOSIS — Z791 Long term (current) use of non-steroidal anti-inflammatories (NSAID): Secondary | ICD-10-CM | POA: Insufficient documentation

## 2016-10-08 DIAGNOSIS — Z538 Procedure and treatment not carried out for other reasons: Secondary | ICD-10-CM | POA: Diagnosis not present

## 2016-10-08 DIAGNOSIS — I2119 ST elevation (STEMI) myocardial infarction involving other coronary artery of inferior wall: Secondary | ICD-10-CM | POA: Diagnosis not present

## 2016-10-08 DIAGNOSIS — Z8701 Personal history of pneumonia (recurrent): Secondary | ICD-10-CM | POA: Diagnosis not present

## 2016-10-08 DIAGNOSIS — M1990 Unspecified osteoarthritis, unspecified site: Secondary | ICD-10-CM | POA: Diagnosis not present

## 2016-10-08 DIAGNOSIS — F17211 Nicotine dependence, cigarettes, in remission: Secondary | ICD-10-CM | POA: Diagnosis not present

## 2016-10-08 DIAGNOSIS — R079 Chest pain, unspecified: Secondary | ICD-10-CM | POA: Diagnosis not present

## 2016-10-08 DIAGNOSIS — R9431 Abnormal electrocardiogram [ECG] [EKG]: Secondary | ICD-10-CM | POA: Diagnosis not present

## 2016-10-08 DIAGNOSIS — E876 Hypokalemia: Secondary | ICD-10-CM | POA: Diagnosis not present

## 2016-10-08 DIAGNOSIS — F1020 Alcohol dependence, uncomplicated: Secondary | ICD-10-CM | POA: Diagnosis not present

## 2016-10-08 DIAGNOSIS — E784 Other hyperlipidemia: Secondary | ICD-10-CM | POA: Diagnosis not present

## 2016-10-08 SURGERY — CANCELLED PROCEDURE
Anesthesia: General | Site: Back

## 2016-10-08 MED ORDER — PROPOFOL 10 MG/ML IV BOLUS
INTRAVENOUS | Status: AC
  Filled 2016-10-08: qty 20

## 2016-10-08 MED ORDER — PHENYLEPHRINE 40 MCG/ML (10ML) SYRINGE FOR IV PUSH (FOR BLOOD PRESSURE SUPPORT)
PREFILLED_SYRINGE | INTRAVENOUS | Status: AC
  Filled 2016-10-08: qty 10

## 2016-10-08 MED ORDER — SUCCINYLCHOLINE CHLORIDE 200 MG/10ML IV SOSY
PREFILLED_SYRINGE | INTRAVENOUS | Status: AC
  Filled 2016-10-08: qty 10

## 2016-10-08 MED ORDER — ONDANSETRON HCL 4 MG/2ML IJ SOLN
INTRAMUSCULAR | Status: AC
  Filled 2016-10-08: qty 2

## 2016-10-08 MED ORDER — EPHEDRINE 5 MG/ML INJ
INTRAVENOUS | Status: AC
  Filled 2016-10-08: qty 10

## 2016-10-08 MED ORDER — ROCURONIUM BROMIDE 10 MG/ML (PF) SYRINGE
PREFILLED_SYRINGE | INTRAVENOUS | Status: AC
  Filled 2016-10-08: qty 10

## 2016-10-08 MED ORDER — LIDOCAINE 2% (20 MG/ML) 5 ML SYRINGE
INTRAMUSCULAR | Status: AC
  Filled 2016-10-08: qty 5

## 2016-10-08 MED ORDER — SUGAMMADEX SODIUM 200 MG/2ML IV SOLN
INTRAVENOUS | Status: AC
  Filled 2016-10-08: qty 2

## 2016-10-08 MED ORDER — FENTANYL CITRATE (PF) 100 MCG/2ML IJ SOLN
INTRAMUSCULAR | Status: AC
  Filled 2016-10-08: qty 2

## 2016-10-08 NOTE — Anesthesia Preprocedure Evaluation (Deleted)
Anesthesia Evaluation  Patient identified by MRN, date of birth, ID band Patient awake    Reviewed: Allergy & Precautions, NPO status , Patient's Chart, lab work & pertinent test results  Airway Mallampati: II  TM Distance: >3 FB     Dental   Pulmonary sleep apnea , former smoker,    breath sounds clear to auscultation       Cardiovascular hypertension, + Past MI   Rhythm:Regular Rate:Normal     Neuro/Psych    GI/Hepatic Neg liver ROS, GERD  ,  Endo/Other  negative endocrine ROS  Renal/GU negative Renal ROS     Musculoskeletal  (+) Arthritis ,   Abdominal   Peds  Hematology   Anesthesia Other Findings   Reproductive/Obstetrics                             Anesthesia Physical Anesthesia Plan  ASA: III  Anesthesia Plan: General   Post-op Pain Management:    Induction: Intravenous  Airway Management Planned: Oral ETT  Additional Equipment:   Intra-op Plan:   Post-operative Plan: Possible Post-op intubation/ventilation  Informed Consent: I have reviewed the patients History and Physical, chart, labs and discussed the procedure including the risks, benefits and alternatives for the proposed anesthesia with the patient or authorized representative who has indicated his/her understanding and acceptance.   Dental advisory given  Plan Discussed with: CRNA and Anesthesiologist  Anesthesia Plan Comments:         Anesthesia Quick Evaluation

## 2016-10-08 NOTE — H&P (Signed)
Subjective: Patient is a 75 y.o. male admitted for stenosis. Onset of symptoms was a few months ago, gradually worsening since that time.  The pain is rated severe, and is located at the across the lower back and radiates to legs. The pain is described as aching and occurs intermittently. The symptoms have been progressive. Symptoms are exacerbated by exercise. MRI or CT showed stenosis L2-3. Previous surgery by another surgeon at L3-4, L4-5   Past Medical History:  Diagnosis Date  . Anxiety   . Arthritis   . Bladder cancer ([place])   . Bladder stone   . BPH (benign prostatic hypertrophy)   . Complication of anesthesia    "I didn't feel right after endoscopy anesthesia"   . Coronary artery disease cardiologist--    DR Jonni Sanger [facility] ([facility])  . Depression   . Foley catheter in place   . GERD (gastroesophageal reflux disease)   . History of bladder cancer    CARCINOMA IN SITU OF BLADDER  . History of kidney stones   . History of myocardial infarction    07-17-2011--   MI W/ OCCLUDED OM1  . Hyperlipidemia   . Hypertension   . Myocardial infarction 2012   cardiac studies on chart  . OSA (obstructive sleep apnea)    cpap non-compliant   . Prostate cancer [facility])     Past Surgical History:  Procedure Laterality Date  . CARDIAC CATHETERIZATION  07-17-2011  DR ANDY CHIU ([facility])   OCCLUDED OM1 WITH UNSUCCESSFUL PCI ATTEMPT/ MILD TO MODERATE DISEASE ELSEWHERE  . CARDIOVASCULAR STRESS TEST  04/22/11  . CYSTO/ BLADDER BX'S  X2  2001  / X1  2003  . CYSTO/ BLADDER BX'S/ BILATERAL RETROGRADE URETEROPYLEGRAM  2003; 2007; 2008;  12-30-2007   CARCINOMA IN SITU OF BLADDER  . CYSTOSCOPY WITH LITHOLAPAXY N/A 05/07/2013   Procedure: CYSTOSCOPY WITH LITHOLAPAXY;  Surgeon: Franchot Gallo, MD;  [facility];  Service: Urology;  Laterality: N/A;  . LUMBAR DECOMPRESSION FORAMINOTOMY L3 -- L5/  REMOVAL CYST L4-5 FACET JOINT  08-21-2009   ALSO HAD PRIOR BACK SURG IN  1992  . OPEN TENOTOMY OF FLEXOR 2ND AND 3RD RIGHT TOES  APRIL 2008  . PROSTATE BIOPSY    . TRANSTHORACIC ECHOCARDIOGRAM  07-17-2011  [facility]   MILD TO MODERATE CONCENTRIC LVH/ NORMAL LVSF/ EF 55-60%/ MILD AORTIC STENOSIS  . TRANSURETHRAL RESECTION OF BLADDER TUMOR  01-10-2004  . TRANSURETHRAL RESECTION OF PROSTATE N/A 05/07/2013   Procedure: TRANSURETHRAL RESECTION OF THE PROSTATE WITH GYRUS INSTRUMENTS;  Surgeon: Franchot Gallo, MD;  Location[facility];  Service: Urology;  Laterality: N/A;    Prior to Admission medications   Medication Sig Start Date End Date Taking? Authorizing Provider  ALPRAZolam Duanne Moron) 1 MG tablet Take 0.5-1 mg by mouth 3 (three) times daily as needed for anxiety.   Yes Historical Provider, MD  amLODipine (NORVASC) 5 MG tablet Take 2.5 mg by mouth daily.   Yes Historical Provider, MD  aspirin 81 MG tablet Take 81 mg by mouth daily.   Yes Historical Provider, MD  buPROPion (WELLBUTRIN XL) 300 MG 24 hr tablet Take 300 mg by mouth daily.   Yes Historical Provider, MD  carvedilol (COREG) 6.25 MG tablet Take 6.25 mg by mouth 2 (two) times daily with a meal.   Yes Historical Provider, MD  losartan (COZAAR) 100 MG tablet Take 50 mg by mouth daily.   Yes Historical Provider, MD  meloxicam (MOBIC) 15 MG tablet Take  15 mg by mouth every morning.   Yes Historical Provider, MD  nitroGLYCERIN (NITROSTAT) 0.4 MG SL tablet Place 0.4 mg under the tongue every 5 (five) minutes as needed for chest pain.    Yes Historical Provider, MD  omeprazole (PRILOSEC) 20 MG capsule Take 20 mg by mouth every morning. Reported on 03/08/2016   Yes Historical Provider, MD  rosuvastatin (CRESTOR) 10 MG tablet Take 10 mg by mouth daily.   Yes Historical Provider, MD  naproxen (NAPROSYN) 500 MG tablet Take 1 tablet (500 mg total) by mouth 2 (two) times daily with a meal. Patient not taking: Reported on 09/25/2016 06/01/16   Lysbeth Penner, FNP   Allergies  Allergen Reactions  . Dexamethasone  Other (See Comments)    Frequent Urination  . Augmentin [Amoxicillin-Pot Clavulanate] Nausea And Vomiting  . Sulfa Antibiotics Nausea And Vomiting  . Sulfamethoxazole Nausea And Vomiting    Social History  Substance Use Topics  . Smoking status: Former Smoker    Packs/day: 0.50    Years: 50.00    Types: Cigarettes    Quit date: 04/15/2010  . Smokeless tobacco: Current User    Types: Snuff     Comment: occasional  snuff pack  . Alcohol use Yes     Comment: social    Family History  Problem Relation Age of Onset  . Cancer Father   . Cancer Mother     breast     Review of Systems  Positive ROS: neg  All other systems have been reviewed and were otherwise negative with the exception of those mentioned in the HPI and as above.  Objective: Vital signs in last 24 hours: Temp:  [98 F (36.7 C)] 98 F (36.7 C) (11/27 0630) Pulse Rate:  [69] 69 (11/27 0630) Resp:  [18] 18 (11/27 0630) BP: (124)/(61) 124/61 (11/27 0630) SpO2:  [98 %] 98 % (11/27 0630) Weight:  [107 kg (236 lb)] 107 kg (236 lb) (11/27 0630)  General Appearance: Alert, cooperative, no distress, appears stated age Head: Normocephalic, without obvious abnormality, atraumatic Eyes: PERRL, conjunctiva/corneas clear, EOM's intact    Neck: Supple, symmetrical, trachea midline Back: Symmetric, no curvature, ROM normal, no CVA tenderness Lungs:  respirations unlabored Heart: Regular rate and rhythm Abdomen: Soft, non-tender Extremities: Extremities normal, atraumatic, no cyanosis or edema Pulses: 2+ and symmetric all extremities Skin: Skin color, texture, turgor normal, no rashes or lesions  NEUROLOGIC:   Mental status: Alert and oriented x4,  no aphasia, good attention span, fund of knowledge, and memory Motor Exam - grossly normal Sensory Exam - grossly normal Reflexes: 1+ Coordination - grossly normal Gait - grossly normal Balance - grossly normal Cranial Nerves: I: smell Not tested  II: visual acuity   OS: nl    OD: nl  II: visual fields Full to confrontation  II: pupils Equal, round, reactive to light  III,VII: ptosis None  III,IV,VI: extraocular muscles  Full ROM  V: mastication Normal  V: facial light touch sensation  Normal  V,VII: corneal reflex  Present  VII: facial muscle function - upper  Normal  VII: facial muscle function - lower Normal  VIII: hearing Not tested  IX: soft palate elevation  Normal  IX,X: gag reflex Present  XI: trapezius strength  5/5  XI: sternocleidomastoid strength 5/5  XI: neck flexion strength  5/5  XII: tongue strength  Normal    Data Review Lab Results  Component Value Date   WBC 5.2 10/01/2016   HGB 14.4 10/01/2016  HCT 41.8 10/01/2016   MCV 93.1 10/01/2016   PLT 238 10/01/2016   Lab Results  Component Value Date   NA 137 10/01/2016   K 4.4 10/01/2016   CL 105 10/01/2016   CO2 26 10/01/2016   BUN 22 (H) 10/01/2016   CREATININE 1.06 10/01/2016   GLUCOSE 109 (H) 10/01/2016   Lab Results  Component Value Date   INR 1.06 10/01/2016    Assessment/Plan: Patient admitted for DLL L2-3. Patient has failed a reasonable attempt at conservative therapy.  I explained the condition and procedure to the patient and answered any questions.  Patient wishes to proceed with procedure as planned. Understands risks/ benefits and typical outcomes of procedure.   Ajai Harville S 10/08/2016 7:05 AM

## 2016-10-08 NOTE — Progress Notes (Signed)
Patient ID: Anthony Hartman, male   DOB: [birth year], 75 y.o.   MRN: [id number] Pt has had some substernal CP since Thursday. H/o MI yrs ago, last saw Dr Anthony Hartman in [place]. Has had episodic CP, aching, not stabbing, no SOB or tightness, but did occur with walking ("that was rough") and again last night. I think it is safest to cancel and have him evaluated by Dr Anthony Hartman. Will re-schedule if cleared by cardiology.

## 2016-10-08 NOTE — Progress Notes (Signed)
Pt states that 2 days ago he ate [employer]ender[employer]in,Country Ham,and [place] Sub with Salami. After he ate all this he noticed some pain in center of chest and noted it to be an ache and comes and goes. Last time noticed was 10/07/16.

## 2016-10-09 DIAGNOSIS — M48061 Spinal stenosis, lumbar region without neurogenic claudication: Secondary | ICD-10-CM | POA: Diagnosis not present

## 2016-10-09 DIAGNOSIS — I358 Other nonrheumatic aortic valve disorders: Secondary | ICD-10-CM | POA: Diagnosis not present

## 2016-10-09 DIAGNOSIS — I119 Hypertensive heart disease without heart failure: Secondary | ICD-10-CM | POA: Diagnosis not present

## 2016-10-09 DIAGNOSIS — Z955 Presence of coronary angioplasty implant and graft: Secondary | ICD-10-CM | POA: Diagnosis not present

## 2016-10-09 DIAGNOSIS — I252 Old myocardial infarction: Secondary | ICD-10-CM | POA: Diagnosis not present

## 2016-10-09 DIAGNOSIS — I25118 Atherosclerotic heart disease of native coronary artery with other forms of angina pectoris: Secondary | ICD-10-CM | POA: Diagnosis not present

## 2016-10-09 DIAGNOSIS — I2111 ST elevation (STEMI) myocardial infarction involving right coronary artery: Secondary | ICD-10-CM | POA: Diagnosis not present

## 2016-10-09 DIAGNOSIS — E784 Other hyperlipidemia: Secondary | ICD-10-CM | POA: Diagnosis not present

## 2016-10-10 DIAGNOSIS — I119 Hypertensive heart disease without heart failure: Secondary | ICD-10-CM | POA: Diagnosis not present

## 2016-10-10 DIAGNOSIS — M4800 Spinal stenosis, site unspecified: Secondary | ICD-10-CM | POA: Diagnosis not present

## 2016-10-10 DIAGNOSIS — I25118 Atherosclerotic heart disease of native coronary artery with other forms of angina pectoris: Secondary | ICD-10-CM | POA: Diagnosis not present

## 2016-10-10 DIAGNOSIS — Z955 Presence of coronary angioplasty implant and graft: Secondary | ICD-10-CM | POA: Diagnosis not present

## 2016-10-10 DIAGNOSIS — I501 Left ventricular failure: Secondary | ICD-10-CM | POA: Diagnosis not present

## 2016-10-10 DIAGNOSIS — E784 Other hyperlipidemia: Secondary | ICD-10-CM | POA: Diagnosis not present

## 2016-10-10 DIAGNOSIS — I2111 ST elevation (STEMI) myocardial infarction involving right coronary artery: Secondary | ICD-10-CM | POA: Diagnosis not present

## 2016-10-16 DIAGNOSIS — E782 Mixed hyperlipidemia: Secondary | ICD-10-CM | POA: Diagnosis not present

## 2016-10-16 DIAGNOSIS — R0602 Shortness of breath: Secondary | ICD-10-CM | POA: Diagnosis not present

## 2016-10-16 DIAGNOSIS — M791 Myalgia: Secondary | ICD-10-CM | POA: Diagnosis not present

## 2016-10-16 DIAGNOSIS — I25119 Atherosclerotic heart disease of native coronary artery with unspecified angina pectoris: Secondary | ICD-10-CM | POA: Diagnosis not present

## 2016-10-16 DIAGNOSIS — M5416 Radiculopathy, lumbar region: Secondary | ICD-10-CM | POA: Diagnosis not present

## 2016-10-16 DIAGNOSIS — I10 Essential (primary) hypertension: Secondary | ICD-10-CM | POA: Diagnosis not present

## 2016-10-16 DIAGNOSIS — Z789 Other specified health status: Secondary | ICD-10-CM | POA: Diagnosis not present

## 2016-10-19 ENCOUNTER — Encounter: Payer: Self-pay | Admitting: *Deleted

## 2016-10-19 ENCOUNTER — Other Ambulatory Visit: Payer: Self-pay | Admitting: *Deleted

## 2016-10-19 DIAGNOSIS — I519 Heart disease, unspecified: Secondary | ICD-10-CM | POA: Insufficient documentation

## 2016-10-19 NOTE — Patient Outreach (Signed)
[place] [facility]) Care Management  10/19/2016  Anthony Hartman [birth year] [id number]  Referral for HTA member discharging from [place] facility:  Telephone call to patient who was advised of reason for call & [facility] care management services. HIPPA verification received from patient .   Subjective: Patient voices that he was admitted to [facility] from Nov 27-29, 2017. States he had heart attack & had 2 stents placed. Voices that he had been scheduled for back surgery on that same date but it was cancelled because of his symptoms of chest pain. States he is feeling well today and has not had any chest pain. Voices that he is having leg pain which is caused by back problem. States he will be having epidural when scheduled by his neurosurgeon-Dr D. Ronnald Ramp to help relieve leg pain. Voices he will not be able to have back surgery till cleared by heart MD-Dr. Wyline Copas. Patient voices that he had previous heart attack over 5 years ago. Patient voices that he is taking medications as instructed by doctors. States he was taken off of cholesterol medications because he could not tolerate. States he has gotten all of new prescriptions filled & is taking medications as prescribed. States he is independent but spouse helps him as needed & drives him to appointments. States he currently has restrictions with activity.  Patient states he knows what to do if he has chest pain & knows when to call 911. Patient consents to [facility] telephonic case management services.      Objective:  Per chart review patient has hx CAD, HTN, BPH, Arthritis, Bladder & Prostate cancer, OSA. Lumbar stenosis L2-3. Anxiety PCP-Dr. Gilford Rile. See health assessments as noted.  Encounter Medications:  Outpatient Encounter Prescriptions as of 10/19/2016  Medication Sig Note  . ALPRAZolam (XANAX) 1 MG tablet Take 0.5-1 mg by mouth 3 (three) times daily as needed for anxiety. 10/19/2016: Patient voices that he takes 1 mg  capsule once daily  . amLODipine (NORVASC) 5 MG tablet Take 2.5 mg by mouth daily. 10/19/2016: Patient states taking  '5mg'$   Tablet daily  . aspirin 81 MG tablet Take 81 mg by mouth daily.   Marland Kitchen buPROPion (WELLBUTRIN XL) 300 MG 24 hr tablet Take 300 mg by mouth daily.   . carvedilol (COREG) 6.25 MG tablet Take 6.25 mg by mouth 2 (two) times daily with a meal.   . gabapentin (NEURONTIN) 300 MG capsule 300 mg 1 day or 1 dose. 10/19/2016: Patient states taking 1 table daily -300 mg   . nitroGLYCERIN (NITROSTAT) 0.4 MG SL tablet Place 0.4 mg under the tongue every 5 (five) minutes as needed for chest pain.  09/25/2016: Received from: [facility] Received Sig: Place 0.4 mg under the tongue every 5 (five) minutes as needed for Chest pain.  Marland Kitchen omeprazole (PRILOSEC) 20 MG capsule Take 20 mg by mouth every morning. Reported on 03/08/2016   . losartan (COZAAR) 100 MG tablet Take 50 mg by mouth daily.   . meloxicam (MOBIC) 15 MG tablet Take 15 mg by mouth every morning.   . naproxen (NAPROSYN) 500 MG tablet Take 1 tablet (500 mg total) by mouth 2 (two) times daily with a meal. (Patient not taking: Reported on 10/19/2016)   . rosuvastatin (CRESTOR) 10 MG tablet Take 10 mg by mouth daily.    No facility-administered encounter medications on file as of 10/19/2016.     Functional Status:  In your present state of health, do you  have any difficulty performing the following activities: 10/01/2016  Hearing? N  Vision? N  Difficulty concentrating or making decisions? Y  Walking or climbing stairs? Y  Some recent data might be hidden    Fall/Depression Screening: PHQ 2/9 Scores 10/19/2016  PHQ - 2 Score 1    Assessment:  -Recent hospital discharge with diagnosis of heart attack with stents placed -Transition of care program would be of value./patient consents to [facility] services. -Has support from spouse. -Patient using walker to ambulate.   Plan:  [facility] CM Care Plan Problem One   Flowsheet  Row Most Recent Value  Care Plan Problem One  At risk for readmission  Role Documenting the Problem One  Care Management Telephonic Coordinator  Care Plan for Problem One  Active  THN Long Term Goal (31-90 days)  No readmission within 14 days  THN Long Term Goal Start Date  10/19/16  Interventions for Problem One Long Term Goal  Advise of MD f/u, taking meds as prescribed, repoting sxs  THN CM Short Term Goal #1 (0-30 days)  Pt will make f/u appts within 14 days  THN CM Short Term Goal #1 Start Date  10/19/16  Interventions for Short Term Goal #1  Advise of importance of attending hospital f/u appts  THN CM Short Term Goal #2 (0-30 days)  Pt will voice -taking meds as instructed by MD  [facility] CM Short Term Goal #2 Start Date  10/19/16  Interventions for Short Term Goal #2  Review medications with patient, advise of importance of taking as instructed by MD     Care Plan developed as noted above. Will follow up with patient and he agrees with set telephonic appointment. Will send MD involvement letter. Will send welcome packet to patient. Will notify care management assistant of open case.   Sherrin Daisy, RN BSN [place] Management Coordinator [facility] Care Management  [phone]

## 2016-10-24 ENCOUNTER — Encounter: Payer: Self-pay | Admitting: *Deleted

## 2016-10-24 DIAGNOSIS — F330 Major depressive disorder, recurrent, mild: Secondary | ICD-10-CM | POA: Diagnosis not present

## 2016-10-24 DIAGNOSIS — I2510 Atherosclerotic heart disease of native coronary artery without angina pectoris: Secondary | ICD-10-CM | POA: Diagnosis not present

## 2016-10-24 DIAGNOSIS — M150 Primary generalized (osteo)arthritis: Secondary | ICD-10-CM | POA: Diagnosis not present

## 2016-10-24 DIAGNOSIS — M170 Bilateral primary osteoarthritis of knee: Secondary | ICD-10-CM | POA: Diagnosis not present

## 2016-10-25 ENCOUNTER — Encounter: Payer: Self-pay | Admitting: *Deleted

## 2016-10-25 ENCOUNTER — Other Ambulatory Visit: Payer: Self-pay | Admitting: *Deleted

## 2016-10-25 NOTE — Patient Outreach (Signed)
[place] [facility]) Care Management  10/25/2016  KARTIK FERNANDO 28-Oct-1941 [id number]  Transition of care call #2; Telephone call to patient who gave HIPPA verification.  Patient voices that he made follow up appointments with heart doctor & primary care provider and has attended those appointments on 12/05 and 12/13.   Voices that he is having knee pain due to back problem. States he has been advised by his specialist not to have epidural right now due to most recent heart attack. States primary MD has placed him on another pain medication but he does not know name of medication. States he will pick up medication today from pharmacy. Voices that gabapentin is not relieving his knee pain however he continues to take as prescribed. States he will discuss new medication with his back specialist before taking because of sensitive  " blood pressure".  States taking all of current medications as prescribed.  Voices that his is not requiring walker now but is being careful with his steps. States walking around in home  helps with easing some of knee pain.  States is not having chest pain but knows action plan of calling 911 if it occurs. States does know when & how to use nitroglycerin if needed.    Patient continues to have support & assistance from spouse as needed.   Patient progressing with care plans goals. See updated care plan as noted.  Plan; Will follow up next week. Patient agrees to set appointment.  Sherrin Daisy, RN BSN [place] Management Coordinator [facility] Care Management  [phone]

## 2016-10-26 ENCOUNTER — Encounter: Payer: Self-pay | Admitting: *Deleted

## 2016-11-01 ENCOUNTER — Other Ambulatory Visit: Payer: Self-pay | Admitting: *Deleted

## 2016-11-01 NOTE — Patient Outreach (Signed)
[place] [facility]) Care Management  11/01/2016  Anthony Hartman [birth year] [id number]  Telephone call to patient; left message on voice mail requesting call back.  Plan: will follow up.   Sherrin Daisy, RN BSN [place] Management Coordinator [facility] Care Management  [phone]

## 2016-11-02 ENCOUNTER — Other Ambulatory Visit: Payer: Self-pay | Admitting: *Deleted

## 2016-11-02 NOTE — Patient Outreach (Signed)
[place] [facility]) Care Management  11/02/2016  Anthony Hartman Feb 03, 1941 [id number]  Telephone call to patient; left message requesting return call.  Plan: Will follow up. Sherrin Daisy, RN BSN [place] Management Coordinator [facility] Care Management  [phone]

## 2016-11-09 ENCOUNTER — Encounter: Payer: Self-pay | Admitting: *Deleted

## 2016-11-09 ENCOUNTER — Other Ambulatory Visit: Payer: Self-pay | Admitting: *Deleted

## 2016-11-09 NOTE — Patient Outreach (Signed)
[place] [facility]) Care Management  11/09/2016  Anthony Hartman November 20, 1940 [id number]  Transition of care call to patient; left message on voice mail requesting call back..  Return call from patient. Patient voices that he has not had any readmissions or emergency room visits since previous hospital admission.   Patient has attended MD appointments as scheduled and taking medications as instructed.  Transitions of care calls have been completed and patient goals met..  Patient agrees with case closure. No further case management needs at this time.   Plan: Update care plan.  Case closed out Sent MD closure letter.  Sherrin Daisy, RN BSN [place] Management Coordinator [facility] Care Management  [phone]

## 2016-11-14 DIAGNOSIS — G9520 Unspecified cord compression: Secondary | ICD-10-CM | POA: Diagnosis not present

## 2016-11-14 DIAGNOSIS — I252 Old myocardial infarction: Secondary | ICD-10-CM | POA: Diagnosis not present

## 2016-11-14 DIAGNOSIS — I10 Essential (primary) hypertension: Secondary | ICD-10-CM | POA: Diagnosis not present

## 2016-11-14 DIAGNOSIS — M5416 Radiculopathy, lumbar region: Secondary | ICD-10-CM | POA: Diagnosis not present

## 2016-11-14 DIAGNOSIS — Z789 Other specified health status: Secondary | ICD-10-CM | POA: Diagnosis not present

## 2016-11-14 DIAGNOSIS — I2510 Atherosclerotic heart disease of native coronary artery without angina pectoris: Secondary | ICD-10-CM | POA: Diagnosis not present

## 2016-11-20 DIAGNOSIS — Z955 Presence of coronary angioplasty implant and graft: Secondary | ICD-10-CM | POA: Diagnosis not present

## 2016-11-27 DIAGNOSIS — Z79899 Other long term (current) drug therapy: Secondary | ICD-10-CM | POA: Diagnosis not present

## 2016-11-27 DIAGNOSIS — E119 Type 2 diabetes mellitus without complications: Secondary | ICD-10-CM | POA: Diagnosis not present

## 2016-11-27 DIAGNOSIS — C61 Malignant neoplasm of prostate: Secondary | ICD-10-CM | POA: Diagnosis not present

## 2016-11-27 DIAGNOSIS — E782 Mixed hyperlipidemia: Secondary | ICD-10-CM | POA: Diagnosis not present

## 2016-12-10 DIAGNOSIS — C61 Malignant neoplasm of prostate: Secondary | ICD-10-CM | POA: Diagnosis not present

## 2016-12-10 DIAGNOSIS — N5201 Erectile dysfunction due to arterial insufficiency: Secondary | ICD-10-CM | POA: Diagnosis not present

## 2016-12-10 DIAGNOSIS — C670 Malignant neoplasm of trigone of bladder: Secondary | ICD-10-CM | POA: Diagnosis not present

## 2016-12-14 DIAGNOSIS — I252 Old myocardial infarction: Secondary | ICD-10-CM | POA: Diagnosis not present

## 2016-12-14 DIAGNOSIS — Z955 Presence of coronary angioplasty implant and graft: Secondary | ICD-10-CM | POA: Diagnosis not present

## 2016-12-17 DIAGNOSIS — M48061 Spinal stenosis, lumbar region without neurogenic claudication: Secondary | ICD-10-CM | POA: Diagnosis not present

## 2016-12-18 DIAGNOSIS — G8929 Other chronic pain: Secondary | ICD-10-CM | POA: Diagnosis not present

## 2016-12-18 DIAGNOSIS — M76891 Other specified enthesopathies of right lower limb, excluding foot: Secondary | ICD-10-CM | POA: Diagnosis not present

## 2016-12-18 DIAGNOSIS — M7652 Patellar tendinitis, left knee: Secondary | ICD-10-CM | POA: Diagnosis not present

## 2016-12-18 DIAGNOSIS — M76892 Other specified enthesopathies of left lower limb, excluding foot: Secondary | ICD-10-CM | POA: Diagnosis not present

## 2016-12-18 DIAGNOSIS — M7651 Patellar tendinitis, right knee: Secondary | ICD-10-CM | POA: Diagnosis not present

## 2016-12-18 DIAGNOSIS — M170 Bilateral primary osteoarthritis of knee: Secondary | ICD-10-CM | POA: Diagnosis not present

## 2016-12-18 DIAGNOSIS — Z9889 Other specified postprocedural states: Secondary | ICD-10-CM | POA: Diagnosis not present

## 2016-12-18 DIAGNOSIS — Z955 Presence of coronary angioplasty implant and graft: Secondary | ICD-10-CM | POA: Diagnosis not present

## 2016-12-19 DIAGNOSIS — R079 Chest pain, unspecified: Secondary | ICD-10-CM | POA: Diagnosis not present

## 2017-02-07 DIAGNOSIS — M9903 Segmental and somatic dysfunction of lumbar region: Secondary | ICD-10-CM | POA: Diagnosis not present

## 2017-02-07 DIAGNOSIS — M62838 Other muscle spasm: Secondary | ICD-10-CM | POA: Diagnosis not present

## 2017-02-07 DIAGNOSIS — S336XXA Sprain of sacroiliac joint, initial encounter: Secondary | ICD-10-CM | POA: Diagnosis not present

## 2017-02-07 DIAGNOSIS — M545 Low back pain: Secondary | ICD-10-CM | POA: Diagnosis not present

## 2017-02-07 DIAGNOSIS — M5136 Other intervertebral disc degeneration, lumbar region: Secondary | ICD-10-CM | POA: Diagnosis not present

## 2017-02-07 DIAGNOSIS — M9902 Segmental and somatic dysfunction of thoracic region: Secondary | ICD-10-CM | POA: Diagnosis not present

## 2017-02-07 DIAGNOSIS — M9905 Segmental and somatic dysfunction of pelvic region: Secondary | ICD-10-CM | POA: Diagnosis not present

## 2017-02-12 DIAGNOSIS — I10 Essential (primary) hypertension: Secondary | ICD-10-CM | POA: Diagnosis not present

## 2017-02-12 DIAGNOSIS — E782 Mixed hyperlipidemia: Secondary | ICD-10-CM | POA: Diagnosis not present

## 2017-02-12 DIAGNOSIS — I350 Nonrheumatic aortic (valve) stenosis: Secondary | ICD-10-CM | POA: Diagnosis not present

## 2017-02-12 DIAGNOSIS — Z789 Other specified health status: Secondary | ICD-10-CM | POA: Diagnosis not present

## 2017-02-12 DIAGNOSIS — M5416 Radiculopathy, lumbar region: Secondary | ICD-10-CM | POA: Diagnosis not present

## 2017-02-12 DIAGNOSIS — I2510 Atherosclerotic heart disease of native coronary artery without angina pectoris: Secondary | ICD-10-CM | POA: Diagnosis not present

## 2017-02-12 DIAGNOSIS — R011 Cardiac murmur, unspecified: Secondary | ICD-10-CM | POA: Diagnosis not present

## 2017-02-12 DIAGNOSIS — I252 Old myocardial infarction: Secondary | ICD-10-CM | POA: Diagnosis not present

## 2017-02-12 DIAGNOSIS — M4712 Other spondylosis with myelopathy, cervical region: Secondary | ICD-10-CM | POA: Diagnosis not present

## 2017-02-13 DIAGNOSIS — M9903 Segmental and somatic dysfunction of lumbar region: Secondary | ICD-10-CM | POA: Diagnosis not present

## 2017-02-13 DIAGNOSIS — M5136 Other intervertebral disc degeneration, lumbar region: Secondary | ICD-10-CM | POA: Diagnosis not present

## 2017-02-13 DIAGNOSIS — M9905 Segmental and somatic dysfunction of pelvic region: Secondary | ICD-10-CM | POA: Diagnosis not present

## 2017-02-13 DIAGNOSIS — M545 Low back pain: Secondary | ICD-10-CM | POA: Diagnosis not present

## 2017-02-13 DIAGNOSIS — M9902 Segmental and somatic dysfunction of thoracic region: Secondary | ICD-10-CM | POA: Diagnosis not present

## 2017-02-13 DIAGNOSIS — S336XXA Sprain of sacroiliac joint, initial encounter: Secondary | ICD-10-CM | POA: Diagnosis not present

## 2017-02-13 DIAGNOSIS — M62838 Other muscle spasm: Secondary | ICD-10-CM | POA: Diagnosis not present

## 2017-02-18 DIAGNOSIS — M62838 Other muscle spasm: Secondary | ICD-10-CM | POA: Diagnosis not present

## 2017-02-18 DIAGNOSIS — S336XXA Sprain of sacroiliac joint, initial encounter: Secondary | ICD-10-CM | POA: Diagnosis not present

## 2017-02-18 DIAGNOSIS — M9903 Segmental and somatic dysfunction of lumbar region: Secondary | ICD-10-CM | POA: Diagnosis not present

## 2017-02-18 DIAGNOSIS — M9905 Segmental and somatic dysfunction of pelvic region: Secondary | ICD-10-CM | POA: Diagnosis not present

## 2017-02-18 DIAGNOSIS — M9902 Segmental and somatic dysfunction of thoracic region: Secondary | ICD-10-CM | POA: Diagnosis not present

## 2017-02-18 DIAGNOSIS — M5136 Other intervertebral disc degeneration, lumbar region: Secondary | ICD-10-CM | POA: Diagnosis not present

## 2017-02-18 DIAGNOSIS — M545 Low back pain: Secondary | ICD-10-CM | POA: Diagnosis not present

## 2017-02-20 DIAGNOSIS — M9903 Segmental and somatic dysfunction of lumbar region: Secondary | ICD-10-CM | POA: Diagnosis not present

## 2017-02-20 DIAGNOSIS — M62838 Other muscle spasm: Secondary | ICD-10-CM | POA: Diagnosis not present

## 2017-02-20 DIAGNOSIS — M545 Low back pain: Secondary | ICD-10-CM | POA: Diagnosis not present

## 2017-02-20 DIAGNOSIS — M9905 Segmental and somatic dysfunction of pelvic region: Secondary | ICD-10-CM | POA: Diagnosis not present

## 2017-02-20 DIAGNOSIS — S336XXA Sprain of sacroiliac joint, initial encounter: Secondary | ICD-10-CM | POA: Diagnosis not present

## 2017-02-20 DIAGNOSIS — M9902 Segmental and somatic dysfunction of thoracic region: Secondary | ICD-10-CM | POA: Diagnosis not present

## 2017-02-20 DIAGNOSIS — M5136 Other intervertebral disc degeneration, lumbar region: Secondary | ICD-10-CM | POA: Diagnosis not present

## 2017-02-21 DIAGNOSIS — Z01810 Encounter for preprocedural cardiovascular examination: Secondary | ICD-10-CM | POA: Diagnosis not present

## 2017-02-21 DIAGNOSIS — R011 Cardiac murmur, unspecified: Secondary | ICD-10-CM | POA: Diagnosis not present

## 2017-02-22 DIAGNOSIS — D638 Anemia in other chronic diseases classified elsewhere: Secondary | ICD-10-CM | POA: Diagnosis not present

## 2017-02-22 DIAGNOSIS — E782 Mixed hyperlipidemia: Secondary | ICD-10-CM | POA: Diagnosis not present

## 2017-02-22 DIAGNOSIS — Z79899 Other long term (current) drug therapy: Secondary | ICD-10-CM | POA: Diagnosis not present

## 2017-02-22 DIAGNOSIS — D649 Anemia, unspecified: Secondary | ICD-10-CM | POA: Diagnosis not present

## 2017-02-22 DIAGNOSIS — E119 Type 2 diabetes mellitus without complications: Secondary | ICD-10-CM | POA: Diagnosis not present

## 2017-03-04 DIAGNOSIS — M2041 Other hammer toe(s) (acquired), right foot: Secondary | ICD-10-CM | POA: Diagnosis not present

## 2017-03-04 DIAGNOSIS — L97511 Non-pressure chronic ulcer of other part of right foot limited to breakdown of skin: Secondary | ICD-10-CM | POA: Diagnosis not present

## 2017-03-04 DIAGNOSIS — L03031 Cellulitis of right toe: Secondary | ICD-10-CM | POA: Diagnosis not present

## 2017-03-12 DIAGNOSIS — L97511 Non-pressure chronic ulcer of other part of right foot limited to breakdown of skin: Secondary | ICD-10-CM | POA: Diagnosis not present

## 2017-03-12 DIAGNOSIS — E119 Type 2 diabetes mellitus without complications: Secondary | ICD-10-CM | POA: Diagnosis not present

## 2017-03-12 DIAGNOSIS — L03031 Cellulitis of right toe: Secondary | ICD-10-CM | POA: Diagnosis not present

## 2017-03-12 HISTORY — PX: LEFT HEART CATH AND CORONARY ANGIOGRAPHY: CATH118249

## 2017-03-12 HISTORY — PX: CORONARY ANGIOPLASTY: SHX604

## 2017-03-18 ENCOUNTER — Other Ambulatory Visit: Payer: Self-pay | Admitting: Neurological Surgery

## 2017-03-18 DIAGNOSIS — M48061 Spinal stenosis, lumbar region without neurogenic claudication: Secondary | ICD-10-CM | POA: Diagnosis not present

## 2017-03-19 DIAGNOSIS — M170 Bilateral primary osteoarthritis of knee: Secondary | ICD-10-CM | POA: Diagnosis not present

## 2017-03-21 DIAGNOSIS — I25110 Atherosclerotic heart disease of native coronary artery with unstable angina pectoris: Secondary | ICD-10-CM | POA: Diagnosis not present

## 2017-03-21 DIAGNOSIS — M1990 Unspecified osteoarthritis, unspecified site: Secondary | ICD-10-CM | POA: Diagnosis not present

## 2017-03-21 DIAGNOSIS — C679 Malignant neoplasm of bladder, unspecified: Secondary | ICD-10-CM | POA: Diagnosis not present

## 2017-03-21 DIAGNOSIS — I350 Nonrheumatic aortic (valve) stenosis: Secondary | ICD-10-CM | POA: Diagnosis not present

## 2017-03-21 DIAGNOSIS — Z7902 Long term (current) use of antithrombotics/antiplatelets: Secondary | ICD-10-CM | POA: Diagnosis not present

## 2017-03-21 DIAGNOSIS — Z7982 Long term (current) use of aspirin: Secondary | ICD-10-CM | POA: Diagnosis not present

## 2017-03-21 DIAGNOSIS — R079 Chest pain, unspecified: Secondary | ICD-10-CM | POA: Diagnosis not present

## 2017-03-21 DIAGNOSIS — I252 Old myocardial infarction: Secondary | ICD-10-CM | POA: Diagnosis not present

## 2017-03-21 DIAGNOSIS — Z87891 Personal history of nicotine dependence: Secondary | ICD-10-CM | POA: Diagnosis not present

## 2017-03-21 DIAGNOSIS — I10 Essential (primary) hypertension: Secondary | ICD-10-CM | POA: Diagnosis not present

## 2017-03-21 DIAGNOSIS — E782 Mixed hyperlipidemia: Secondary | ICD-10-CM | POA: Diagnosis not present

## 2017-03-21 DIAGNOSIS — I2582 Chronic total occlusion of coronary artery: Secondary | ICD-10-CM | POA: Diagnosis not present

## 2017-03-21 DIAGNOSIS — Z882 Allergy status to sulfonamides status: Secondary | ICD-10-CM | POA: Diagnosis not present

## 2017-03-21 DIAGNOSIS — G4730 Sleep apnea, unspecified: Secondary | ICD-10-CM | POA: Diagnosis not present

## 2017-03-21 DIAGNOSIS — Z79899 Other long term (current) drug therapy: Secondary | ICD-10-CM | POA: Diagnosis not present

## 2017-03-21 DIAGNOSIS — I25118 Atherosclerotic heart disease of native coronary artery with other forms of angina pectoris: Secondary | ICD-10-CM | POA: Diagnosis not present

## 2017-03-21 DIAGNOSIS — Z8546 Personal history of malignant neoplasm of prostate: Secondary | ICD-10-CM | POA: Diagnosis not present

## 2017-03-21 DIAGNOSIS — Z8551 Personal history of malignant neoplasm of bladder: Secondary | ICD-10-CM | POA: Diagnosis not present

## 2017-03-27 ENCOUNTER — Encounter (HOSPITAL_COMMUNITY)
Admission: RE | Admit: 2017-03-27 | Discharge: 2017-03-27 | Disposition: A | Discharge status: Home / Self Care | Payer: PPO | Source: Ambulatory Visit | Attending: Neurological Surgery | Admitting: Neurological Surgery

## 2017-03-27 ENCOUNTER — Encounter (HOSPITAL_COMMUNITY): Payer: Self-pay

## 2017-03-27 DIAGNOSIS — Z01812 Encounter for preprocedural laboratory examination: Secondary | ICD-10-CM | POA: Diagnosis not present

## 2017-03-27 DIAGNOSIS — M4800 Spinal stenosis, site unspecified: Secondary | ICD-10-CM | POA: Diagnosis not present

## 2017-03-27 LAB — CBC WITH DIFFERENTIAL/PLATELET
Basophils Absolute: 0 10*3/uL (ref 0.0–0.1)
Basophils Relative: 1 %
Eosinophils Absolute: 0.2 10*3/uL (ref 0.0–0.7)
Eosinophils Relative: 3 %
HCT: 38.9 % — ABNORMAL LOW (ref 39.0–52.0)
Hemoglobin: 13.2 g/dL (ref 13.0–17.0)
Lymphocytes Relative: 13 %
Lymphs Abs: 0.8 10*3/uL (ref 0.7–4.0)
MCH: 31.7 pg (ref 26.0–34.0)
MCHC: 33.9 g/dL (ref 30.0–36.0)
MCV: 93.3 fL (ref 78.0–100.0)
Monocytes Absolute: 1 10*3/uL (ref 0.1–1.0)
Monocytes Relative: 15 %
Neutro Abs: 4.5 10*3/uL (ref 1.7–7.7)
Neutrophils Relative %: 68 %
Platelets: 276 10*3/uL (ref 150–400)
RBC: 4.17 MIL/uL — ABNORMAL LOW (ref 4.22–5.81)
RDW: 12.7 % (ref 11.5–15.5)
WBC: 6.5 10*3/uL (ref 4.0–10.5)

## 2017-03-27 LAB — BASIC METABOLIC PANEL
Anion gap: 7 (ref 5–15)
BUN: 22 mg/dL — ABNORMAL HIGH (ref 6–20)
CO2: 25 mmol/L (ref 22–32)
Calcium: 9.2 mg/dL (ref 8.9–10.3)
Chloride: 104 mmol/L (ref 101–111)
Creatinine, Ser: 0.99 mg/dL (ref 0.61–1.24)
GFR calc Af Amer: >60 mL/min (ref >60)
GFR calc non Af Amer: >60 mL/min (ref >60)
Glucose, Bld: 85 mg/dL (ref 65–99)
Potassium: 4.2 mmol/L (ref 3.5–5.1)
Sodium: 136 mmol/L (ref 135–145)

## 2017-03-27 LAB — SURGICAL PCR SCREEN
MRSA, PCR: NEGATIVE
Staphylococcus aureus: NEGATIVE

## 2017-03-27 LAB — PROTIME-INR
INR: 1.03
Prothrombin Time: 13.5 seconds (ref 11.4–15.2)

## 2017-03-27 MED ORDER — CHLORHEXIDINE GLUCONATE CLOTH 2 % EX PADS: 6.0000 | MEDICATED_PAD | Freq: Once | CUTANEOUS | Status: DC

## 2017-03-27 NOTE — Progress Notes (Signed)
Pt is taking Plavix but not told when to stop. I called Anthony Hartman at [place] office who is going to contact his cardiologist Dr.Chui to find out and then Anthony Hartman will contact pt

## 2017-03-27 NOTE — Pre-Procedure Instructions (Signed)
Anthony Hartman  03/27/2017      [id number] [place] [facility] [id number]: [phone]: [phone] [facility] [address]Phone: [phone]: [phone] [place] [place]Phone: [phone]: [phone] [id number] [address] [place] [address] [place]: [phone] Fax: [phone]  Your procedure is scheduled on Thurs, May 24 @ 1:50 PM  Report to [place] 11:50 AM  Call this number if you have problems the morning of surgery:  [phone]   Remember:  Do not eat food or drink liquids after midnight.  Take these medicines the morning of surgery with A SIP OF WATER Amlodipine(Norvasc),Wellbutrin(Bupriopion),Carvedilol(Coreg),Isosorbide(Imdur),Omeprazole(Prilosec),Pain Pill(if needed),and Valacyclovir(Valtrex-if needed)                No Goody's,BC's,Aleve,Advil,Motrin,Ibuprofen,Fish Oil,or any Herbal Medications.    Do not wear jewelry.  Do not wear lotions, powders,colognes, or deoderant.             Men may shave face and neck.  Do not bring valuables to the hospital.  [facility] responsible for any belongings or valuables.  Contacts, dentures or bridgework may not be worn into surgery.  Leave your suitcase in the car.  After surgery it may be brought to your room.  For patients admitted to the hospital, discharge time will be determined by your treatment team.  Patients discharged the day of surgery will not be allowed to drive home.    [facility] - Preparing for [facility]  Before surgery, you can play an important role.  Because skin is not sterile, your skin  needs to be as free of germs as possible.  You can reduce the number of germs on you skin by washing with CHG (chlorahexidine gluconate) soap before surgery.  CHG is an antiseptic cleaner which kills germs and bonds with the skin to continue killing germs even after washing.  Please DO NOT use if you have an allergy to CHG or antibacterial soaps.  If your skin becomes reddened/irritated stop using the CHG and inform your nurse when you arrive at Short Stay.  Do not shave (including legs and underarms) for at least 48 hours prior to the first CHG shower.  You may shave your face.  Please follow these instructions carefully:   1.  Shower with CHG Soap the night before surgery and the                                morning of Surgery.  2.  If you choose to wash your hair, wash your hair first as usual with your       normal shampoo.  3.  After you shampoo, rinse your hair and body thoroughly to remove the                      Shampoo.  4.  Use CHG as you would any other liquid soap.  You can apply chg directly       to the skin and wash gently with  scrungie or a clean washcloth.  5.  Apply the CHG Soap to your body ONLY FROM THE NECK DOWN.        Do not use on open wounds or open sores.  Avoid contact with your eyes,       ears, mouth and genitals (private parts).  Wash genitals (private parts)       with your normal soap.  6.  Wash thoroughly, paying special attention to the area where your surgery        will be performed.  7.  Thoroughly rinse your body with warm water from the neck down.  8.  DO NOT shower/wash with your normal soap after using and rinsing off       the CHG Soap.  9.  Pat yourself dry with a clean towel.            10.  Wear clean pajamas.            11.  Place clean sheets on your bed the night of your first shower and do not        sleep with pets.  Day of Surgery  Do not apply any lotions/deoderants the morning of surgery.  Please wear clean clothes to the hospital/surgery  center.  Please read over the following fact sheets that you were given. Pain Booklet, Coughing and Deep Breathing, MRSA Information and Surgical Site Infection Prevention

## 2017-03-27 NOTE — Progress Notes (Signed)
Cardiologist is Dr.Chiu with last visit in epic from 03-21-17  Medical MD is Dr.Greg Bea Graff  Echo report in epic from 2018  Heart cath report in epic from 03-2017  Stress test few yrs ago  EKG to be requested from Dr.Chiu  CXR in epic from 10-01-16

## 2017-03-28 ENCOUNTER — Encounter (HOSPITAL_COMMUNITY): Payer: Self-pay

## 2017-03-28 DIAGNOSIS — I2510 Atherosclerotic heart disease of native coronary artery without angina pectoris: Secondary | ICD-10-CM | POA: Diagnosis not present

## 2017-03-28 DIAGNOSIS — R490 Dysphonia: Secondary | ICD-10-CM | POA: Diagnosis not present

## 2017-03-28 DIAGNOSIS — R358 Other polyuria: Secondary | ICD-10-CM | POA: Diagnosis not present

## 2017-03-28 NOTE — Progress Notes (Addendum)
Anesthesia Chart Review:  Pt is a 76 year old male scheduled for L2-3 lamiinectomy and foraminotomy on 04/04/2017 with Sherley Bounds, MD  - PCP is Gilford Rile, MD (notes in care everywhere) - Cardiologist is Sharalyn Ink) Wyline Copas, MD who has cleared pt for surgery (notes in care everywhere)  - Surgery was originally scheduled for 10/08/16. When pt arrived to holding DOS, pt reported chest pain on and off for a couple of days. Decision made to have pt see his cardiologist and get CP evaluated prior to surgery. After pt left holding, he was seen that same day by his cardiologist and sent to ED; ED EKG showed STEMI. Underwent cardiac cath, BMS placed in occluded RCA.   - Pt c/o chest pain last week, underwent repeat cardiac cath [facility]/21/17 [facility] (in care everywhere).  See results below.  CTO of ramus with failed attempt at PCI. No change from prior cath.   PMH includes:  CAD (BMS to RCA 10/08/16; CTO OM1 with unsuccessful PCI at [facility] 07/17/11 [correspondence in media tab 05/09/13]), HTN hyperlipidemia aortic stenosis, GERD. Former smoker. BMI 27.  Medications include: Amlodipine, ASA 81 mg, carvedilol, Plavix, 78, Imdur, losartan, Prilosec. Pt to stop plavix 5 days before surgery.   Preoperative labs reviewed.    CXR 10/01/16: No active cardiopulmonary disease.  Chronic changes are noted.  EKG 03/21/17: sinus bradycardia (55 bpm). Nonspecific ST depression  Cardiac cath 03/21/17 (care everywhere):  Diagnostic Summary:  - Chronic Total Occlusion of the Ramus - Mild stenosis/ectasia of the small mid Circumflex - Moderate stenosis of the LAD & Diagonal Branch - These lesions are No change from prior study. - No restenosis of the RCA . - Normal LV systolic function. - LV ejection fraction is 60 % Interventional Summary: - Unsuccessful PCI of ostial ramus due to failure to cross with guidewire. Interventional Recommendations - Medical therapy for CAD Risk factor reduction, Angina Consider more  aggressive PCI of ramus Branch if fails medical therapy .  Echo 02/21/17 [facility] cardiology [place]): 1. Normal left systolic function. EF 60-C5 percent. 2. Mildly dilated LA. 3. Moderate aortic stenosis with mean gradient 27 mmHg. Trace aortic regurgitation 4. Mild tricuspid regurgitation.  If no changes, I anticipate pt can proceed with surgery as scheduled.   Willeen Cass, FNP-BC [facility] Short Stay Surgical Center/Anesthesiology Phone: [phone] 03/29/2017 2:13 PM

## 2017-03-29 DIAGNOSIS — R490 Dysphonia: Secondary | ICD-10-CM | POA: Diagnosis not present

## 2017-03-29 DIAGNOSIS — K219 Gastro-esophageal reflux disease without esophagitis: Secondary | ICD-10-CM | POA: Diagnosis not present

## 2017-03-29 DIAGNOSIS — Z7901 Long term (current) use of anticoagulants: Secondary | ICD-10-CM | POA: Diagnosis not present

## 2017-03-29 NOTE — Progress Notes (Signed)
Pt called this morning and states he never heard from [place] in regards to stopping his Plavix. I called and left Lorriane Shire another message and asked that she please call and let pt know what to do. Pt notified

## 2017-04-02 DIAGNOSIS — M2011 Hallux valgus (acquired), right foot: Secondary | ICD-10-CM | POA: Diagnosis not present

## 2017-04-02 DIAGNOSIS — M2041 Other hammer toe(s) (acquired), right foot: Secondary | ICD-10-CM | POA: Diagnosis not present

## 2017-04-03 ENCOUNTER — Encounter (HOSPITAL_COMMUNITY): Payer: Self-pay | Admitting: Certified Registered Nurse Anesthetist

## 2017-04-03 IMAGING — CR DG CHEST 2V
2 series · 2 of 2 positions shown · non-contrast
Comparison: 08/18/2015

CLINICAL DATA: Preop for lumbar surgery

EXAM:
CHEST  2 VIEW

[w chest pa]
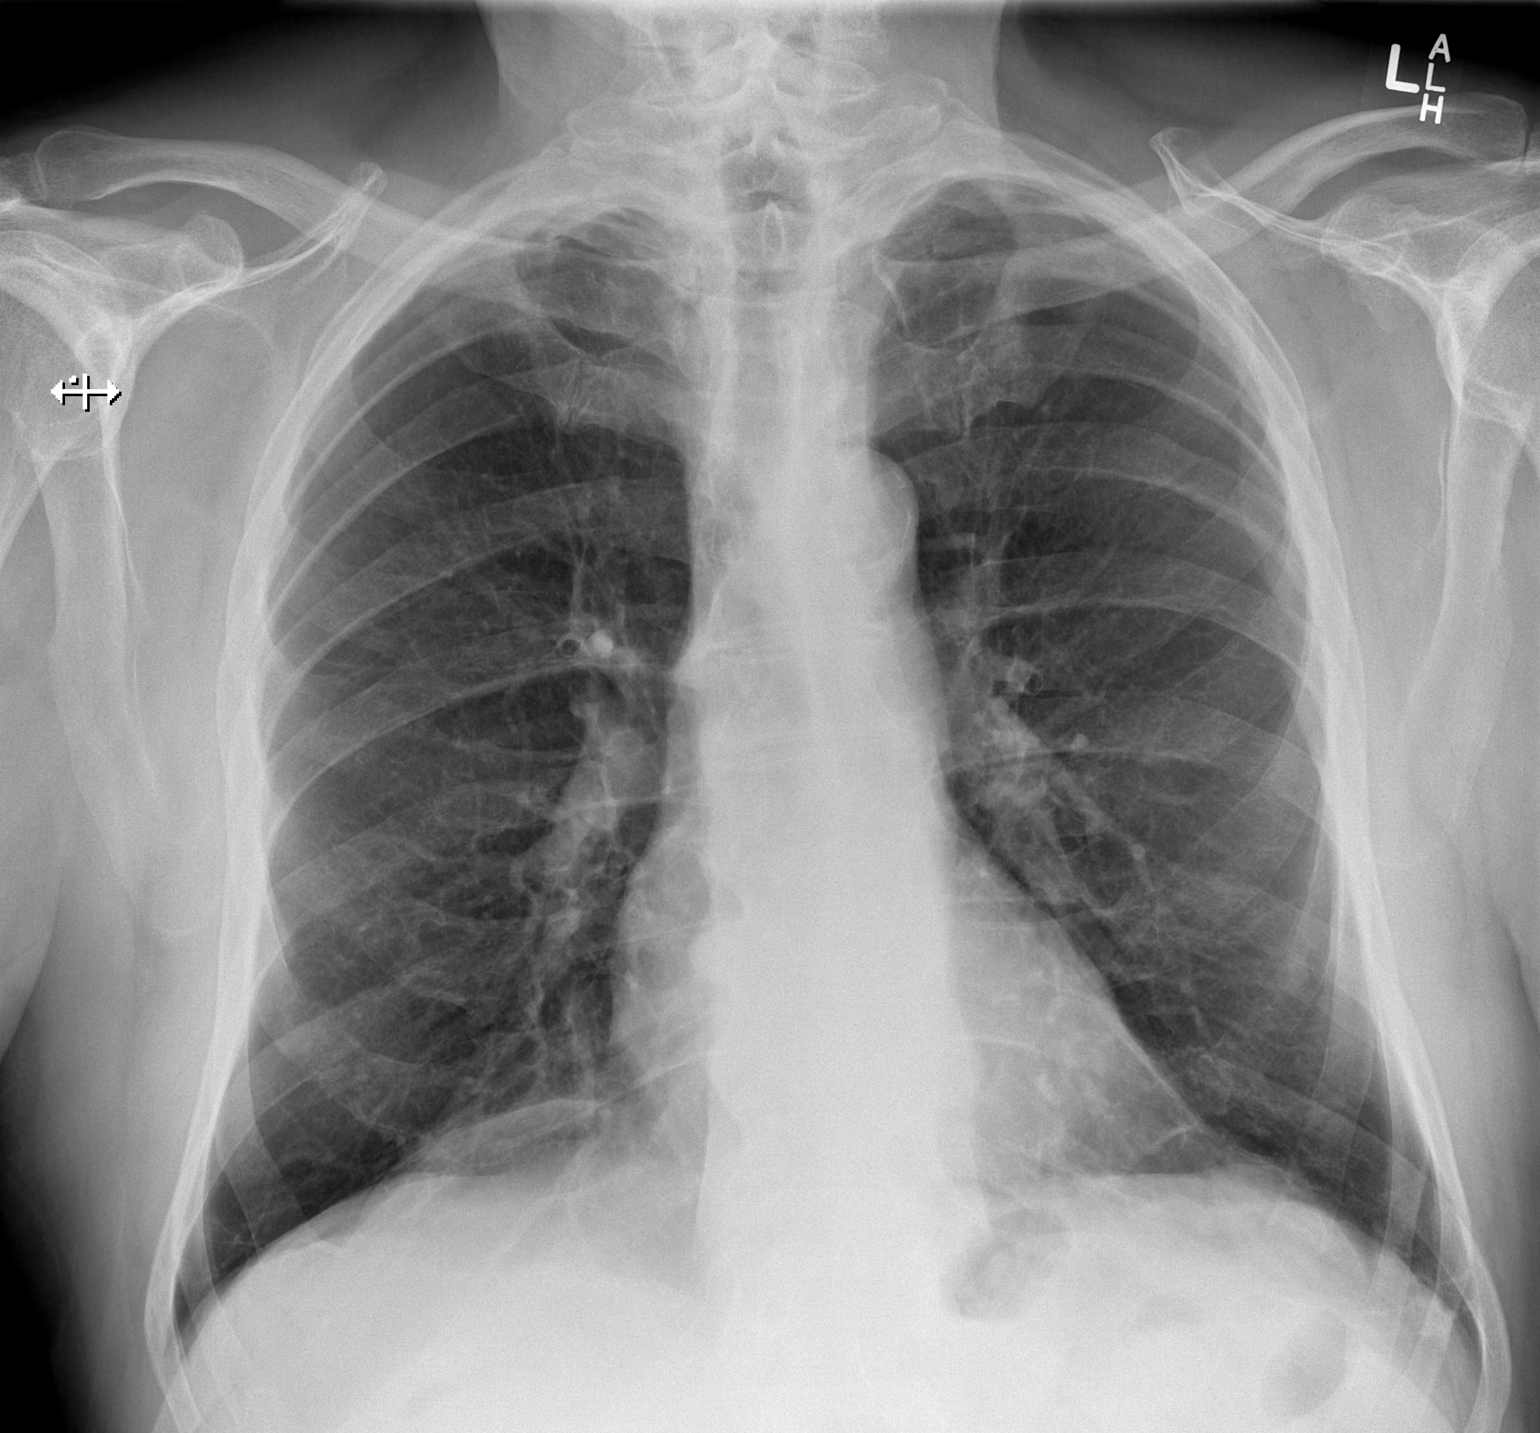

[w chest lat]
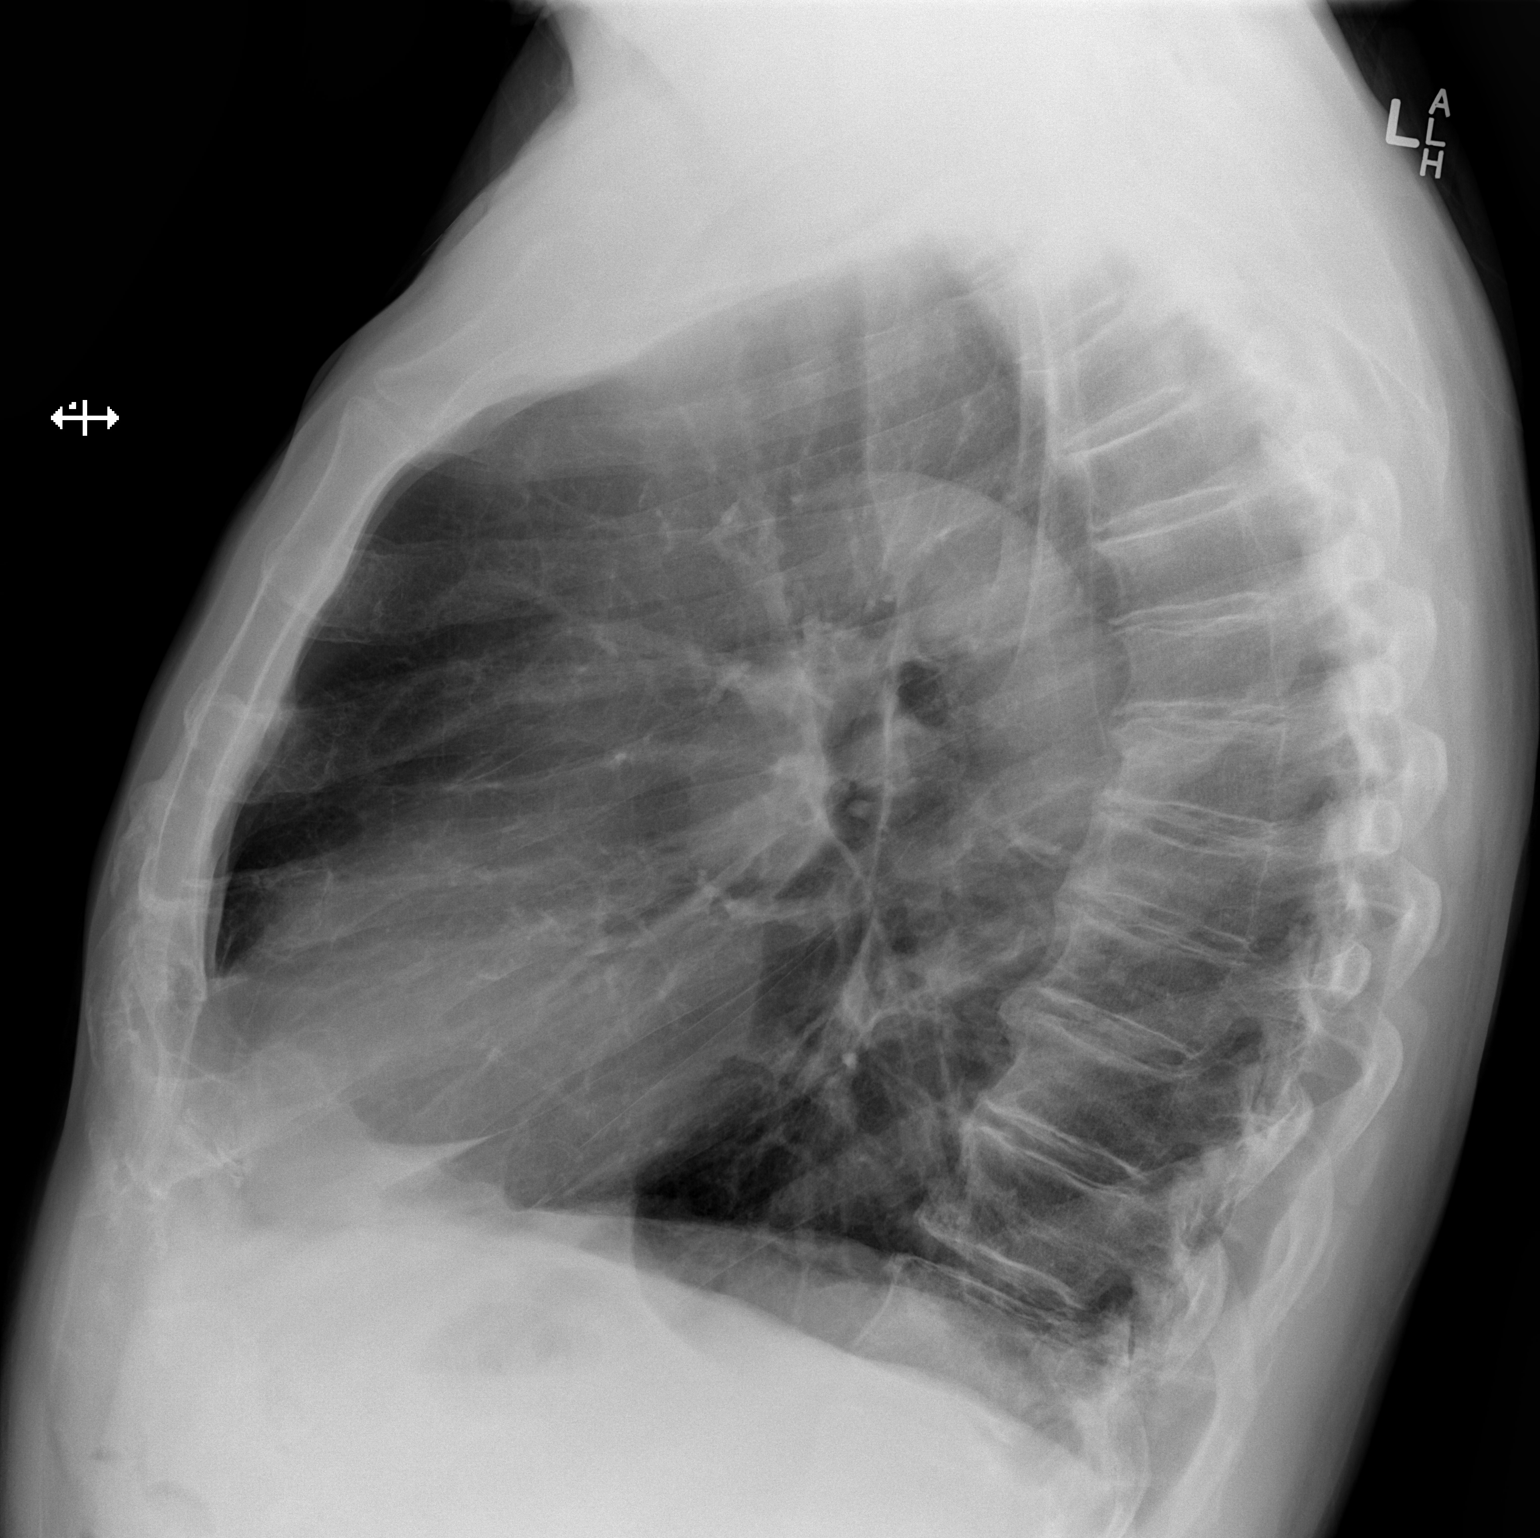

[2 of 2 positions shown; findings below may reference images not displayed]

FINDINGS: There is markedly increased AP diameter of the chest. Normal heart
size. No consolidation or mass. Chronic apical pleural changes.
Minimal scarring at the lung bases. Stable appearance of the
thoracic spine.
IMPRESSION: No active cardiopulmonary disease.  Chronic changes are noted.

## 2017-04-03 MED ORDER — VANCOMYCIN HCL 10 G IV SOLR
1500.0000 mg | INTRAVENOUS | Status: AC
  Administered 2017-04-04: 1500 mg via INTRAVENOUS
  Filled 2017-04-03: qty 1500

## 2017-04-03 NOTE — Anesthesia Preprocedure Evaluation (Addendum)
Anesthesia Evaluation  Patient identified by MRN, date of birth, ID band Patient awake    Reviewed: Allergy & Precautions, H&P , NPO status , Patient's Chart, lab work & pertinent test results  Airway Mallampati: II  TM Distance: >3 FB Neck ROM: Full    Dental no notable dental hx. (+) Teeth Intact, Dental Advisory Given   Pulmonary neg pulmonary ROS, former smoker,    Pulmonary exam normal breath sounds clear to auscultation       Cardiovascular Exercise Tolerance: Good hypertension, Pt. on medications and Pt. on home beta blockers + CAD, + Past MI and + Cardiac Stents   Rhythm:Regular Rate:Normal     Neuro/Psych  Headaches, Anxiety Depression negative psych ROS   GI/Hepatic Neg liver ROS, GERD  Medicated and Controlled,  Endo/Other  negative endocrine ROS  Renal/GU negative Renal ROS  negative genitourinary   Musculoskeletal  (+) Arthritis , Osteoarthritis,    Abdominal   Peds  Hematology negative hematology ROS (+)   Anesthesia Other Findings   Reproductive/Obstetrics negative OB ROS                            Anesthesia Physical Anesthesia Plan  ASA: III  Anesthesia Plan: General   Post-op Pain Management:    Induction: Intravenous  Airway Management Planned: Oral ETT  Additional Equipment: Arterial line  Intra-op Plan:   Post-operative Plan: Extubation in OR  Informed Consent: I have reviewed the patients History and Physical, chart, labs and discussed the procedure including the risks, benefits and alternatives for the proposed anesthesia with the patient or authorized representative who has indicated his/her understanding and acceptance.   Dental advisory given  Plan Discussed with: CRNA  Anesthesia Plan Comments:         Anesthesia Quick Evaluation

## 2017-04-04 ENCOUNTER — Observation Stay (HOSPITAL_COMMUNITY)
Admission: RE | Admit: 2017-04-04 | Discharge: 2017-04-05 | Disposition: A | Discharge status: Home / Self Care | Payer: PPO | Source: Ambulatory Visit | Attending: Neurological Surgery | Admitting: Neurological Surgery

## 2017-04-04 ENCOUNTER — Encounter (HOSPITAL_COMMUNITY): Payer: Self-pay

## 2017-04-04 ENCOUNTER — Encounter (HOSPITAL_COMMUNITY)
Admission: RE | Disposition: A | Discharge status: Home / Self Care | Payer: Self-pay | Source: Ambulatory Visit | Attending: Neurological Surgery

## 2017-04-04 ENCOUNTER — Ambulatory Visit (HOSPITAL_COMMUNITY): Payer: PPO | Admitting: Emergency Medicine

## 2017-04-04 ENCOUNTER — Ambulatory Visit (HOSPITAL_COMMUNITY): Payer: PPO | Admitting: Certified Registered Nurse Anesthetist

## 2017-04-04 ENCOUNTER — Ambulatory Visit (HOSPITAL_COMMUNITY): Payer: PPO

## 2017-04-04 DIAGNOSIS — Z7982 Long term (current) use of aspirin: Secondary | ICD-10-CM | POA: Insufficient documentation

## 2017-04-04 DIAGNOSIS — Z87891 Personal history of nicotine dependence: Secondary | ICD-10-CM | POA: Insufficient documentation

## 2017-04-04 DIAGNOSIS — I2510 Atherosclerotic heart disease of native coronary artery without angina pectoris: Secondary | ICD-10-CM | POA: Insufficient documentation

## 2017-04-04 DIAGNOSIS — F329 Major depressive disorder, single episode, unspecified: Secondary | ICD-10-CM | POA: Insufficient documentation

## 2017-04-04 DIAGNOSIS — Z8546 Personal history of malignant neoplasm of prostate: Secondary | ICD-10-CM | POA: Diagnosis not present

## 2017-04-04 DIAGNOSIS — M48061 Spinal stenosis, lumbar region without neurogenic claudication: Secondary | ICD-10-CM | POA: Diagnosis not present

## 2017-04-04 DIAGNOSIS — G4733 Obstructive sleep apnea (adult) (pediatric): Secondary | ICD-10-CM | POA: Diagnosis not present

## 2017-04-04 DIAGNOSIS — M1990 Unspecified osteoarthritis, unspecified site: Secondary | ICD-10-CM | POA: Insufficient documentation

## 2017-04-04 DIAGNOSIS — E785 Hyperlipidemia, unspecified: Secondary | ICD-10-CM | POA: Diagnosis not present

## 2017-04-04 DIAGNOSIS — R3915 Urgency of urination: Secondary | ICD-10-CM | POA: Diagnosis not present

## 2017-04-04 DIAGNOSIS — Z809 Family history of malignant neoplasm, unspecified: Secondary | ICD-10-CM | POA: Insufficient documentation

## 2017-04-04 DIAGNOSIS — M961 Postlaminectomy syndrome, not elsewhere classified: Secondary | ICD-10-CM | POA: Diagnosis not present

## 2017-04-04 DIAGNOSIS — I252 Old myocardial infarction: Secondary | ICD-10-CM | POA: Insufficient documentation

## 2017-04-04 DIAGNOSIS — Z87442 Personal history of urinary calculi: Secondary | ICD-10-CM | POA: Diagnosis not present

## 2017-04-04 DIAGNOSIS — Z9889 Other specified postprocedural states: Secondary | ICD-10-CM

## 2017-04-04 DIAGNOSIS — N400 Enlarged prostate without lower urinary tract symptoms: Secondary | ICD-10-CM | POA: Diagnosis not present

## 2017-04-04 DIAGNOSIS — I10 Essential (primary) hypertension: Secondary | ICD-10-CM | POA: Diagnosis not present

## 2017-04-04 DIAGNOSIS — F419 Anxiety disorder, unspecified: Secondary | ICD-10-CM | POA: Diagnosis not present

## 2017-04-04 DIAGNOSIS — Z888 Allergy status to other drugs, medicaments and biological substances status: Secondary | ICD-10-CM | POA: Insufficient documentation

## 2017-04-04 DIAGNOSIS — R350 Frequency of micturition: Secondary | ICD-10-CM | POA: Diagnosis not present

## 2017-04-04 DIAGNOSIS — Z803 Family history of malignant neoplasm of breast: Secondary | ICD-10-CM | POA: Insufficient documentation

## 2017-04-04 DIAGNOSIS — Z882 Allergy status to sulfonamides status: Secondary | ICD-10-CM | POA: Insufficient documentation

## 2017-04-04 DIAGNOSIS — K219 Gastro-esophageal reflux disease without esophagitis: Secondary | ICD-10-CM | POA: Insufficient documentation

## 2017-04-04 DIAGNOSIS — R51 Headache: Secondary | ICD-10-CM | POA: Diagnosis not present

## 2017-04-04 DIAGNOSIS — Z8551 Personal history of malignant neoplasm of bladder: Secondary | ICD-10-CM | POA: Insufficient documentation

## 2017-04-04 DIAGNOSIS — Z419 Encounter for procedure for purposes other than remedying health state, unspecified: Secondary | ICD-10-CM

## 2017-04-04 DIAGNOSIS — Z880 Allergy status to penicillin: Secondary | ICD-10-CM | POA: Insufficient documentation

## 2017-04-04 DIAGNOSIS — Z79899 Other long term (current) drug therapy: Secondary | ICD-10-CM | POA: Insufficient documentation

## 2017-04-04 HISTORY — PX: LUMBAR LAMINECTOMY/DECOMPRESSION MICRODISCECTOMY: SHX5026

## 2017-04-04 HISTORY — PX: LUMBAR LAMINECTOMY: SHX95

## 2017-04-04 LAB — GLUCOSE, CAPILLARY
Glucose-Capillary: 92 mg/dL (ref 65–99)
Glucose-Capillary: 117 mg/dL — ABNORMAL HIGH (ref 65–99)

## 2017-04-04 SURGERY — LUMBAR LAMINECTOMY/DECOMPRESSION MICRODISCECTOMY 1 LEVEL
Anesthesia: General | Site: Spine Lumbar

## 2017-04-04 MED ORDER — PHENYLEPHRINE HCL 10 MG/ML IJ SOLN
INTRAMUSCULAR | Status: AC
  Filled 2017-04-04: qty 1

## 2017-04-04 MED ORDER — ONDANSETRON HCL 4 MG/2ML IJ SOLN: 4.0000 mg | Freq: Four times a day (QID) | INTRAMUSCULAR | Status: DC | PRN

## 2017-04-04 MED ORDER — SODIUM CHLORIDE 0.9% FLUSH
3.0000 mL | INTRAVENOUS | Status: DC | PRN
Start: 2017-04-04 — End: 2017-04-05

## 2017-04-04 MED ORDER — LOSARTAN POTASSIUM 25 MG PO TABS
25.0000 mg | ORAL_TABLET | Freq: Every day | ORAL | Status: DC
  Administered 2017-04-04: 25 mg via ORAL
  Filled 2017-04-04: qty 1

## 2017-04-04 MED ORDER — BUPROPION HCL ER (XL) 150 MG PO TB24
300.0000 mg | ORAL_TABLET | Freq: Every day | ORAL | Status: DC
  Administered 2017-04-05: 300 mg via ORAL
  Filled 2017-04-04 (×2): qty 2

## 2017-04-04 MED ORDER — LACTATED RINGERS IV SOLN
INTRAVENOUS | Status: DC | PRN
  Administered 2017-04-04 (×2): via INTRAVENOUS

## 2017-04-04 MED ORDER — ASPIRIN 81 MG PO TABS: 81.0000 mg | ORAL_TABLET | Freq: Every day | ORAL | Status: DC

## 2017-04-04 MED ORDER — MORPHINE SULFATE (PF) 4 MG/ML IV SOLN
2.0000 mg | INTRAVENOUS | Status: DC | PRN
  Administered 2017-04-04 – 2017-04-05 (×3): 2 mg via INTRAVENOUS
  Filled 2017-04-04 (×3): qty 1

## 2017-04-04 MED ORDER — INSULIN ASPART 100 UNIT/ML ~~LOC~~ SOLN: 0.0000 [IU] | Freq: Three times a day (TID) | SUBCUTANEOUS | Status: DC

## 2017-04-04 MED ORDER — ASPIRIN EC 325 MG PO TBEC
DELAYED_RELEASE_TABLET | ORAL | Status: AC
  Administered 2017-04-04: 325 mg
  Filled 2017-04-04: qty 1

## 2017-04-04 MED ORDER — MIDAZOLAM HCL 2 MG/2ML IJ SOLN
INTRAMUSCULAR | Status: AC
  Filled 2017-04-04: qty 2

## 2017-04-04 MED ORDER — VANCOMYCIN HCL IN DEXTROSE 1-5 GM/200ML-% IV SOLN
1000.0000 mg | Freq: Once | INTRAVENOUS | Status: AC
  Administered 2017-04-04: 1000 mg via INTRAVENOUS
  Filled 2017-04-04: qty 200

## 2017-04-04 MED ORDER — NEOSTIGMINE METHYLSULFATE 5 MG/5ML IV SOSY
PREFILLED_SYRINGE | INTRAVENOUS | Status: AC
  Filled 2017-04-04: qty 5

## 2017-04-04 MED ORDER — BUPIVACAINE HCL (PF) 0.25 % IJ SOLN
INTRAMUSCULAR | Status: DC | PRN
  Administered 2017-04-04: 10 mL

## 2017-04-04 MED ORDER — PHENOL 1.4 % MT LIQD: 1.0000 | OROMUCOSAL | Status: DC | PRN

## 2017-04-04 MED ORDER — MENTHOL 3 MG MT LOZG: 1.0000 | LOZENGE | OROMUCOSAL | Status: DC | PRN

## 2017-04-04 MED ORDER — ADULT MULTIVITAMIN W/MINERALS CH
1.0000 | ORAL_TABLET | Freq: Every day | ORAL | Status: DC
  Administered 2017-04-04 – 2017-04-05 (×2): 1 via ORAL
  Filled 2017-04-04 (×2): qty 1

## 2017-04-04 MED ORDER — VALACYCLOVIR HCL 500 MG PO TABS: 1000.0000 mg | ORAL_TABLET | Freq: Three times a day (TID) | ORAL | Status: DC | PRN

## 2017-04-04 MED ORDER — ONDANSETRON HCL 4 MG/2ML IJ SOLN
INTRAMUSCULAR | Status: AC
  Filled 2017-04-04: qty 2

## 2017-04-04 MED ORDER — AMLODIPINE BESYLATE 2.5 MG PO TABS
2.5000 mg | ORAL_TABLET | Freq: Every day | ORAL | Status: DC
  Administered 2017-04-04 – 2017-04-05 (×2): 2.5 mg via ORAL
  Filled 2017-04-04 (×2): qty 1

## 2017-04-04 MED ORDER — HYDROCODONE-ACETAMINOPHEN 7.5-325 MG PO TABS
ORAL_TABLET | ORAL | Status: AC
  Administered 2017-04-04: 1
  Filled 2017-04-04: qty 1

## 2017-04-04 MED ORDER — HYPROMELLOSE (GONIOSCOPIC) 2.5 % OP SOLN
1.0000 [drp] | Freq: Every day | OPHTHALMIC | Status: DC | PRN
  Filled 2017-04-04: qty 15

## 2017-04-04 MED ORDER — MIDAZOLAM HCL 5 MG/5ML IJ SOLN
INTRAMUSCULAR | Status: DC | PRN
  Administered 2017-04-04 (×2): 1 mg via INTRAVENOUS

## 2017-04-04 MED ORDER — ROCURONIUM BROMIDE 10 MG/ML (PF) SYRINGE
PREFILLED_SYRINGE | INTRAVENOUS | Status: DC | PRN
  Administered 2017-04-04: 50 mg via INTRAVENOUS

## 2017-04-04 MED ORDER — GABAPENTIN 300 MG PO CAPS
300.0000 mg | ORAL_CAPSULE | ORAL | Status: DC
  Administered 2017-04-04: 300 mg via ORAL
  Filled 2017-04-04 (×2): qty 1

## 2017-04-04 MED ORDER — TRAMADOL HCL 50 MG PO TABS
50.0000 mg | ORAL_TABLET | Freq: Four times a day (QID) | ORAL | Status: DC | PRN
  Administered 2017-04-05: 50 mg via ORAL
  Filled 2017-04-04: qty 1

## 2017-04-04 MED ORDER — THROMBIN 5000 UNITS EX SOLR
OROMUCOSAL | Status: DC | PRN
  Administered 2017-04-04: 09:00:00 via TOPICAL

## 2017-04-04 MED ORDER — EPHEDRINE SULFATE 50 MG/ML IJ SOLN
INTRAMUSCULAR | Status: DC | PRN
  Administered 2017-04-04 (×2): 10 mg via INTRAVENOUS
  Administered 2017-04-04: 5 mg via INTRAVENOUS

## 2017-04-04 MED ORDER — ACETAMINOPHEN 650 MG RE SUPP: 650.0000 mg | RECTAL | Status: DC | PRN

## 2017-04-04 MED ORDER — PROPOFOL 10 MG/ML IV BOLUS
INTRAVENOUS | Status: AC
  Filled 2017-04-04: qty 20

## 2017-04-04 MED ORDER — ALPRAZOLAM 0.25 MG PO TABS
0.2500 mg | ORAL_TABLET | Freq: Every evening | ORAL | Status: DC | PRN
  Administered 2017-04-04: 0.25 mg via ORAL
  Filled 2017-04-04: qty 1

## 2017-04-04 MED ORDER — HYDROCODONE-ACETAMINOPHEN 7.5-325 MG PO TABS
1.0000 | ORAL_TABLET | Freq: Four times a day (QID) | ORAL | Status: DC
  Administered 2017-04-04 – 2017-04-05 (×4): 1 via ORAL
  Filled 2017-04-04 (×4): qty 1

## 2017-04-04 MED ORDER — GLYCOPYRROLATE 0.2 MG/ML IV SOSY
PREFILLED_SYRINGE | INTRAVENOUS | Status: DC | PRN
  Administered 2017-04-04: 0.6 mg via INTRAVENOUS

## 2017-04-04 MED ORDER — METHOCARBAMOL 500 MG PO TABS
ORAL_TABLET | ORAL | Status: AC
  Administered 2017-04-04: 500 mg
  Filled 2017-04-04: qty 1

## 2017-04-04 MED ORDER — THROMBIN 5000 UNITS EX SOLR
CUTANEOUS | Status: AC
  Filled 2017-04-04: qty 15000

## 2017-04-04 MED ORDER — EZETIMIBE 10 MG PO TABS
10.0000 mg | ORAL_TABLET | Freq: Every day | ORAL | Status: DC
  Administered 2017-04-04: 10 mg via ORAL
  Filled 2017-04-04: qty 1

## 2017-04-04 MED ORDER — FENTANYL CITRATE (PF) 250 MCG/5ML IJ SOLN
INTRAMUSCULAR | Status: AC
  Filled 2017-04-04: qty 5

## 2017-04-04 MED ORDER — ROCURONIUM BROMIDE 10 MG/ML (PF) SYRINGE
PREFILLED_SYRINGE | INTRAVENOUS | Status: AC
  Filled 2017-04-04: qty 5

## 2017-04-04 MED ORDER — 0.9 % SODIUM CHLORIDE (POUR BTL) OPTIME
TOPICAL | Status: DC | PRN
  Administered 2017-04-04: 1000 mL

## 2017-04-04 MED ORDER — DEXTROSE 5 % IV SOLN
500.0000 mg | Freq: Four times a day (QID) | INTRAVENOUS | Status: DC | PRN
Start: 2017-04-04 — End: 2017-04-05
  Filled 2017-04-04: qty 5

## 2017-04-04 MED ORDER — HYDROMORPHONE HCL 1 MG/ML IJ SOLN
INTRAMUSCULAR | Status: AC
  Filled 2017-04-04: qty 0.5

## 2017-04-04 MED ORDER — LIDOCAINE 2% (20 MG/ML) 5 ML SYRINGE
INTRAMUSCULAR | Status: DC | PRN
  Administered 2017-04-04: 60 mg via INTRAVENOUS

## 2017-04-04 MED ORDER — PHENYLEPHRINE HCL 10 MG/ML IJ SOLN
INTRAMUSCULAR | Status: DC | PRN
  Administered 2017-04-04 (×3): 40 ug via INTRAVENOUS

## 2017-04-04 MED ORDER — HEMOSTATIC AGENTS (NO CHARGE) OPTIME
TOPICAL | Status: DC | PRN
  Administered 2017-04-04: 1 via TOPICAL

## 2017-04-04 MED ORDER — ONDANSETRON HCL 4 MG/2ML IJ SOLN
INTRAMUSCULAR | Status: DC | PRN
  Administered 2017-04-04: 4 mg via INTRAVENOUS

## 2017-04-04 MED ORDER — PANTOPRAZOLE SODIUM 40 MG PO TBEC
40.0000 mg | DELAYED_RELEASE_TABLET | Freq: Every day | ORAL | Status: DC
  Administered 2017-04-05: 40 mg via ORAL
  Filled 2017-04-04 (×2): qty 1

## 2017-04-04 MED ORDER — CARVEDILOL 6.25 MG PO TABS
6.2500 mg | ORAL_TABLET | Freq: Two times a day (BID) | ORAL | Status: DC
  Administered 2017-04-05: 6.25 mg via ORAL
  Filled 2017-04-04: qty 1

## 2017-04-04 MED ORDER — SODIUM CHLORIDE 0.9% FLUSH: 3.0000 mL | Freq: Two times a day (BID) | INTRAVENOUS | Status: DC

## 2017-04-04 MED ORDER — PROPOFOL 10 MG/ML IV BOLUS
INTRAVENOUS | Status: DC | PRN
  Administered 2017-04-04: 100 mg via INTRAVENOUS
  Administered 2017-04-04: 50 mg via INTRAVENOUS

## 2017-04-04 MED ORDER — PHENYLEPHRINE HCL 10 MG/ML IJ SOLN
INTRAMUSCULAR | Status: DC | PRN
  Administered 2017-04-04: 25 ug/min via INTRAVENOUS

## 2017-04-04 MED ORDER — ASPIRIN 325 MG PO TABS
325.0000 mg | ORAL_TABLET | Freq: Every day | ORAL | Status: DC
  Administered 2017-04-05: 325 mg via ORAL
  Filled 2017-04-04 (×3): qty 1

## 2017-04-04 MED ORDER — POTASSIUM CHLORIDE IN NACL 20-0.9 MEQ/L-% IV SOLN: INTRAVENOUS | Status: DC

## 2017-04-04 MED ORDER — NITROGLYCERIN 0.4 MG SL SUBL: 0.4000 mg | SUBLINGUAL_TABLET | SUBLINGUAL | Status: DC | PRN

## 2017-04-04 MED ORDER — SODIUM CHLORIDE 0.9 % IR SOLN
Status: DC | PRN
  Administered 2017-04-04: 09:00:00

## 2017-04-04 MED ORDER — SODIUM CHLORIDE 0.9 % IV SOLN: 250.0000 mL | INTRAVENOUS | Status: DC

## 2017-04-04 MED ORDER — METHOCARBAMOL 500 MG PO TABS
500.0000 mg | ORAL_TABLET | Freq: Four times a day (QID) | ORAL | Status: DC | PRN
  Administered 2017-04-04 – 2017-04-05 (×3): 500 mg via ORAL
  Filled 2017-04-04 (×4): qty 1

## 2017-04-04 MED ORDER — SENNA 8.6 MG PO TABS
1.0000 | ORAL_TABLET | Freq: Two times a day (BID) | ORAL | Status: DC
  Administered 2017-04-04 – 2017-04-05 (×3): 8.6 mg via ORAL
  Filled 2017-04-04 (×3): qty 1

## 2017-04-04 MED ORDER — ASPIRIN EC 81 MG PO TBEC
81.0000 mg | DELAYED_RELEASE_TABLET | Freq: Every day | ORAL | Status: DC
  Administered 2017-04-05: 81 mg via ORAL
  Filled 2017-04-04 (×2): qty 1

## 2017-04-04 MED ORDER — THROMBIN 5000 UNITS EX SOLR
CUTANEOUS | Status: DC | PRN
  Administered 2017-04-04 (×2): 5000 [IU] via TOPICAL

## 2017-04-04 MED ORDER — ISOSORBIDE MONONITRATE ER 30 MG PO TB24
30.0000 mg | ORAL_TABLET | Freq: Every day | ORAL | Status: DC
  Administered 2017-04-05: 30 mg via ORAL
  Filled 2017-04-04: qty 1

## 2017-04-04 MED ORDER — HYDROMORPHONE HCL 1 MG/ML IJ SOLN
0.2500 mg | INTRAMUSCULAR | Status: DC | PRN
  Administered 2017-04-04: 0.5 mg via INTRAVENOUS

## 2017-04-04 MED ORDER — ONDANSETRON HCL 4 MG PO TABS: 4.0000 mg | ORAL_TABLET | Freq: Four times a day (QID) | ORAL | Status: DC | PRN

## 2017-04-04 MED ORDER — CARBOXYMETHYLCELLUL-GLYCERIN 0.5-0.9 % OP SOLN: Freq: Every day | OPHTHALMIC | Status: DC | PRN

## 2017-04-04 MED ORDER — ENSURE ENLIVE PO LIQD
237.0000 mL | Freq: Two times a day (BID) | ORAL | Status: DC
  Administered 2017-04-04: 237 mL via ORAL

## 2017-04-04 MED ORDER — LIDOCAINE 2% (20 MG/ML) 5 ML SYRINGE
INTRAMUSCULAR | Status: AC
  Filled 2017-04-04: qty 5

## 2017-04-04 MED ORDER — FENTANYL CITRATE (PF) 100 MCG/2ML IJ SOLN
INTRAMUSCULAR | Status: DC | PRN
  Administered 2017-04-04: 150 ug via INTRAVENOUS
  Administered 2017-04-04: 100 ug via INTRAVENOUS

## 2017-04-04 MED ORDER — WHITE PETROLATUM GEL
Status: AC
  Administered 2017-04-04: 15:00:00
  Filled 2017-04-04: qty 1

## 2017-04-04 MED ORDER — NEOSTIGMINE METHYLSULFATE 5 MG/5ML IV SOSY
PREFILLED_SYRINGE | INTRAVENOUS | Status: DC | PRN
  Administered 2017-04-04: 3 mg via INTRAVENOUS

## 2017-04-04 MED ORDER — ACETAMINOPHEN 325 MG PO TABS
650.0000 mg | ORAL_TABLET | ORAL | Status: DC | PRN
  Administered 2017-04-05: 650 mg via ORAL
  Filled 2017-04-04: qty 2

## 2017-04-04 SURGICAL SUPPLY — 48 items
BAG DECANTER FOR FLEXI CONT (MISCELLANEOUS) ×2 IMPLANT
BENZOIN TINCTURE PRP APPL 2/3 (GAUZE/BANDAGES/DRESSINGS) ×2 IMPLANT
BUR MATCHSTICK NEURO 3.0 LAGG (BURR) ×2 IMPLANT
CANISTER SUCT 3000ML PPV (MISCELLANEOUS) ×2 IMPLANT
CARTRIDGE OIL MAESTRO DRILL (MISCELLANEOUS) ×1 IMPLANT
DIFFUSER DRILL AIR PNEUMATIC (MISCELLANEOUS) ×2 IMPLANT
DRAPE LAPAROTOMY 100X72X124 (DRAPES) ×2 IMPLANT
DRAPE MICROSCOPE LEICA (MISCELLANEOUS) ×2 IMPLANT
DRAPE POUCH INSTRU U-SHP 10X18 (DRAPES) ×2 IMPLANT
DRAPE SURG 17X23 STRL (DRAPES) ×2 IMPLANT
DRSG OPSITE POSTOP 4X6 (GAUZE/BANDAGES/DRESSINGS) ×2 IMPLANT
DURAPREP 26ML APPLICATOR (WOUND CARE) ×2 IMPLANT
ELECT REM PT RETURN 9FT ADLT (ELECTROSURGICAL) ×2
ELECTRODE REM PT RTRN 9FT ADLT (ELECTROSURGICAL) ×1 IMPLANT
GAUZE SPONGE 4X4 16PLY XRAY LF (GAUZE/BANDAGES/DRESSINGS) IMPLANT
GLOVE BIO SURGEON STRL SZ7 (GLOVE) IMPLANT
GLOVE BIO SURGEON STRL SZ8 (GLOVE) ×4 IMPLANT
GLOVE BIOGEL PI IND STRL 7.0 (GLOVE) IMPLANT
GLOVE BIOGEL PI IND STRL 7.5 (GLOVE) ×4 IMPLANT
GLOVE BIOGEL PI IND STRL 8.5 (GLOVE) ×1 IMPLANT
GLOVE BIOGEL PI INDICATOR 7.0 (GLOVE)
GLOVE BIOGEL PI INDICATOR 7.5 (GLOVE) ×4
GLOVE BIOGEL PI INDICATOR 8.5 (GLOVE) ×1
GLOVE ECLIPSE 7.0 STRL STRAW (GLOVE) ×4 IMPLANT
GOWN STRL REUS W/ TWL LRG LVL3 (GOWN DISPOSABLE) IMPLANT
GOWN STRL REUS W/ TWL XL LVL3 (GOWN DISPOSABLE) ×3 IMPLANT
GOWN STRL REUS W/TWL 2XL LVL3 (GOWN DISPOSABLE) IMPLANT
GOWN STRL REUS W/TWL LRG LVL3 (GOWN DISPOSABLE)
GOWN STRL REUS W/TWL XL LVL3 (GOWN DISPOSABLE) ×3
HEMOSTAT POWDER KIT SURGIFOAM (HEMOSTASIS) ×2 IMPLANT
KIT BASIN OR (CUSTOM PROCEDURE TRAY) ×2 IMPLANT
KIT ROOM TURNOVER OR (KITS) ×2 IMPLANT
NEEDLE HYPO 25X1 1.5 SAFETY (NEEDLE) ×2 IMPLANT
NEEDLE SPNL 20GX3.5 QUINCKE YW (NEEDLE) ×2 IMPLANT
NS IRRIG 1000ML POUR BTL (IV SOLUTION) ×2 IMPLANT
OIL CARTRIDGE MAESTRO DRILL (MISCELLANEOUS) ×2
PACK LAMINECTOMY NEURO (CUSTOM PROCEDURE TRAY) ×2 IMPLANT
PAD ARMBOARD 7.5X6 YLW CONV (MISCELLANEOUS) ×8 IMPLANT
RUBBERBAND STERILE (MISCELLANEOUS) ×4 IMPLANT
SPONGE SURGIFOAM ABS GEL SZ50 (HEMOSTASIS) ×2 IMPLANT
STRIP CLOSURE SKIN 1/2X4 (GAUZE/BANDAGES/DRESSINGS) ×2 IMPLANT
SUT VIC AB 0 CT1 18XCR BRD8 (SUTURE) ×1 IMPLANT
SUT VIC AB 0 CT1 8-18 (SUTURE) ×1
SUT VIC AB 2-0 CP2 18 (SUTURE) ×2 IMPLANT
SUT VIC AB 3-0 SH 8-18 (SUTURE) ×2 IMPLANT
TOWEL GREEN STERILE (TOWEL DISPOSABLE) ×2 IMPLANT
TOWEL GREEN STERILE FF (TOWEL DISPOSABLE) ×2 IMPLANT
WATER STERILE IRR 1000ML POUR (IV SOLUTION) ×2 IMPLANT

## 2017-04-04 NOTE — Transfer of Care (Signed)
Immediate Anesthesia Transfer of Care Note  Patient: Anthony Hartman  Procedure(s) Performed: Procedure(s): Laminectomy and Foraminotomy - Lumbar Two-Three (N/A)  Patient Location: PACU  Anesthesia Type:General  Level of Consciousness: awake, patient cooperative and responds to stimulation  Airway & Oxygen Therapy: Patient Spontanous Breathing and Patient connected to nasal cannula oxygen  Post-op Assessment: Report given to RN, Post -op Vital signs reviewed and stable and Patient moving all extremities X 4  Post vital signs: Reviewed and stable  Last Vitals:  Vitals:   04/04/17 0603  BP: 125/61  Pulse: (!) 52  Resp: 20  Temp: 36.6 C    Last Pain:  Vitals:   04/04/17 0616  TempSrc:   PainSc: 0-No pain      Patients Stated Pain Goal: 3 (15/04/13 6438)  Complications: No apparent anesthesia complications

## 2017-04-04 NOTE — Progress Notes (Signed)
Pt ambulated twice during shift; around the unit x1 and halfway around unit x1 with supervision. Pt back in bed with call light within reach. Will continue to closely monitor. Delia Heady RN

## 2017-04-04 NOTE — H&P (Signed)
Subjective: Patient is a 76 y.o. male admitted for L2--3 stenosis. Onset of symptoms was several months ago, gradually worsening since that time.  The pain is rated severe, unremitting, and is located at the across the lower back and radiates to legs and knees. The pain is described as aching and occurs all day. The symptoms have been progressive. Symptoms are exacerbated by exercise. MRI or CT showed  L2-3  stenosis  Past Medical History:  Diagnosis Date  . Anxiety   . Arthritis   . Bladder cancer ([place])   . Bladder stone   . BPH (benign prostatic hypertrophy)   . Coronary artery disease    BMS to RCA 10/08/16 at [facility]  . Depression   . Foley catheter in place   . GERD (gastroesophageal reflux disease)   . Headache    mild  . History of bladder cancer    CARCINOMA IN SITU OF BLADDER  . History of kidney stones   . History of myocardial infarction    07-17-2011--   MI W/ OCCLUDED OM1  . Hoarseness   . Hyperlipidemia   . Hypertension   . Joint pain   . Joint swelling   . Myocardial infarction [facility]) 2012   cardiac studies on chart  . Nocturia   . OSA (obstructive sleep apnea)    cpap non-compliant   . Pneumonia    many yrs ago  . Prostate cancer ([place])   . Urinary frequency   . Urinary urgency     Past Surgical History:  Procedure Laterality Date  . CARDIAC CATHETERIZATION  07-17-2011  DR ANDY CHIU ([facility])   OCCLUDED OM1 WITH UNSUCCESSFUL PCI ATTEMPT/ MILD TO MODERATE DISEASE ELSEWHERE  . CARDIOVASCULAR STRESS TEST  04/22/11  . CORONARY ANGIOPLASTY     2 stents  . CYSTO/ BLADDER BX'S  X2  2001  / X1  2003  . CYSTO/ BLADDER BX'S/ BILATERAL RETROGRADE URETEROPYLEGRAM  2003; 2007; 2008;  12-30-2007   CARCINOMA IN SITU OF BLADDER  . CYSTOSCOPY WITH LITHOLAPAXY N/A 05/07/2013   Procedure: CYSTOSCOPY WITH LITHOLAPAXY;  Surgeon: Franchot Gallo, MD;  Location: [facility];  Service: Urology;  Laterality: N/A;  . LUMBAR DECOMPRESSION FORAMINOTOMY L3 -- L5/  REMOVAL CYST  L4-5 FACET JOINT  08-21-2009   ALSO HAD PRIOR BACK SURG IN 1992  . OPEN TENOTOMY OF FLEXOR 2ND AND 3RD RIGHT TOES  APRIL 2008  . PROSTATE BIOPSY    . TRANSTHORACIC ECHOCARDIOGRAM  07-17-2011  [facility]   MILD TO MODERATE CONCENTRIC LVH/ NORMAL LVSF/ EF 55-60%/ MILD AORTIC STENOSIS  . TRANSURETHRAL RESECTION OF BLADDER TUMOR  01-10-2004  . TRANSURETHRAL RESECTION OF PROSTATE N/A 05/07/2013   Procedure: TRANSURETHRAL RESECTION OF THE PROSTATE WITH GYRUS INSTRUMENTS;  Surgeon: Franchot Gallo, MD;  Location: [facility];  Service: Urology;  Laterality: N/A;    Prior to Admission medications   Medication Sig Start Date End Date Taking? Authorizing Provider  ALPRAZolam Duanne Moron) 0.5 MG tablet Take 0.25 mg by mouth at bedtime as needed for anxiety.   Yes [provider]  amLODipine (NORVASC) 2.5 MG tablet Take 2.5 mg by mouth daily.   Yes [provider]  aspirin 81 MG tablet Take 81 mg by mouth daily.   Yes [provider]  buPROPion (WELLBUTRIN XL) 300 MG 24 hr tablet Take 300 mg by mouth daily.   Yes [provider]  Carboxymethylcellul-Glycerin (LUBRICATING EYE DROPS OP) Apply 1 drop to eye daily as needed (dry eyes).  Yes [provider]  carvedilol (COREG) 6.25 MG tablet Take 6.25 mg by mouth 2 (two) times daily with a meal.   Yes [provider]  clopidogrel (PLAVIX) 75 MG tablet Take 75 mg by mouth daily.   Yes [provider]  Coenzyme Q10 (COQ-10 PO) Take 1 tablet by mouth daily.   Yes [provider]  ezetimibe (ZETIA) 10 MG tablet Take 10 mg by mouth at bedtime.   Yes [provider]  gabapentin (NEURONTIN) 300 MG capsule Take 300 mg by mouth every other day. At night   Yes [provider]  isosorbide mononitrate (IMDUR) 30 MG 24 hr tablet Take 30 mg by mouth daily.   Yes [provider]  losartan (COZAAR) 25 MG tablet Take 25 mg by mouth at bedtime.   Yes [provider]   Multiple Vitamin (MULTIVITAMIN WITH MINERALS) TABS tablet Take 1 tablet by mouth daily.   Yes [provider]  omeprazole (PRILOSEC) 20 MG capsule Take 20 mg by mouth every morning. Reported on 03/08/2016   Yes [provider]  traMADol (ULTRAM) 50 MG tablet Take 50 mg by mouth daily as needed for pain. 02/22/17  Yes [provider]  valACYclovir (VALTREX) 1000 MG tablet Take 1,000 mg by mouth 3 (three) times daily as needed (fever blisters).  03/03/17  Yes [provider]  nitroGLYCERIN (NITROSTAT) 0.4 MG SL tablet Place 0.4 mg under the tongue every 5 (five) minutes as needed for chest pain.     [provider]   Allergies  Allergen Reactions  . Statins     MD ORDERS UNSPECIFIED REACTION    . Augmentin [Amoxicillin-Pot Clavulanate] Nausea And Vomiting     Has patient had a PCN reaction causing immediate rash, facial/tongue/throat swelling, SOB or lightheadedness with hypotension: No Has patient had a PCN reaction causing severe rash involving mucus membranes or skin necrosis: No Has patient had a PCN reaction that required hospitalization No Has patient had a PCN reaction occurring within the last 10 years: No If all of the above answers are "NO", then may proceed with Cephalosporin use.   Marland Kitchen Dexamethasone Other (See Comments)    Frequent Urination [? HYPERGLYCEMIA? ]  . Sulfa Antibiotics Nausea And Vomiting    Social History  Substance Use Topics  . Smoking status: Former Smoker    Packs/day: 0.50    Years: 50.00    Types: Cigarettes    Quit date: 04/15/2010  . Smokeless tobacco: Current User    Types: Snuff     Comment: occasional  snuff pack  . Alcohol use Yes     Comment: social    Family History  Problem Relation Age of Onset  . Cancer Father   . Cancer Mother        breast     Review of Systems  Positive ROS: neg  All other systems have been reviewed and were otherwise negative with the exception of those mentioned in the  HPI and as above.  Objective: Vital signs in last 24 hours: Temp:  [97.9 F (36.6 C)] 97.9 F (36.6 C) (05/24 0603) Pulse Rate:  [52] 52 (05/24 0603) Resp:  [20] 20 (05/24 0603) BP: (125)/(61) 125/61 (05/24 0603) SpO2:  [93 %] 93 % (05/24 0603) Weight:  [102.5 kg (226 lb)] 102.5 kg (226 lb) (05/24 0616)  General Appearance: Alert, cooperative, no distress, appears stated age Head: Normocephalic, without obvious abnormality, atraumatic Eyes: PERRL, conjunctiva/corneas clear, EOM's intact  Neck: Supple, symmetrical, trachea midline Back: Symmetric, no curvature, ROM normal, no CVA tenderness Lungs:  respirations unlabored Heart: Regular rate and rhythm Abdomen: Soft, non-tender Extremities: Extremities normal, atraumatic, no cyanosis or edema Pulses: 2+ and symmetric all extremities Skin: Skin color, texture, turgor normal, no rashes or lesions  NEUROLOGIC:   Mental status: Alert and oriented x4,  no aphasia, good attention span, fund of knowledge, and memory Motor Exam - grossly normal Sensory Exam - grossly normal Reflexes: 1+ Coordination - grossly normal Gait - grossly normal Balance - grossly normal Cranial Nerves: I: smell Not tested  II: visual acuity  OS: nl    OD: nl  II: visual fields Full to confrontation  II: pupils Equal, round, reactive to light  III,VII: ptosis None  III,IV,VI: extraocular muscles  Full ROM  V: mastication Normal  V: facial light touch sensation  Normal  V,VII: corneal reflex  Present  VII: facial muscle function - upper  Normal  VII: facial muscle function - lower Normal  VIII: hearing Not tested  IX: soft palate elevation  Normal  IX,X: gag reflex Present  XI: trapezius strength  5/5  XI: sternocleidomastoid strength 5/5  XI: neck flexion strength  5/5  XII: tongue strength  Normal    Data Review Lab Results  Component Value Date   WBC 6.5 03/27/2017   HGB 13.2 03/27/2017   HCT 38.9 (L) 03/27/2017   MCV 93.3 03/27/2017    PLT 276 03/27/2017   Lab Results  Component Value Date   NA 136 03/27/2017   K 4.2 03/27/2017   CL 104 03/27/2017   CO2 25 03/27/2017   BUN 22 (H) 03/27/2017   CREATININE 0.99 03/27/2017   GLUCOSE 85 03/27/2017   Lab Results  Component Value Date   INR 1.03 03/27/2017    Assessment/Plan: Patient admitted for LL L2--3. Patient has failed a reasonable attempt at conservative therapy.  I explained the condition and procedure to the patient and answered any questions.  Patient wishes to proceed with procedure as planned. Understands risks/ benefits and typical outcomes of procedure.   Morio Widen S 04/04/2017 6:27 AM

## 2017-04-04 NOTE — Op Note (Signed)
04/04/2017  9:14 AM  PATIENT:  Anthony Hartman  76 y.o. male  PRE-OPERATIVE DIAGNOSIS:  Lumbar spinal stenosis L2-3  POST-OPERATIVE DIAGNOSIS:  same  PROCEDURE:  Decompressive lumbar laminectomy, medial facetectomies, and foraminotomies bilaterally L2-3  SURGEON:  Sherley Bounds, MD  ASSISTANTS: Dr. Vertell Limber  ANESTHESIA:   General  EBL: Less than 50 ml  Total I/O In: 1000 [I.V.:1000] Out: -   BLOOD ADMINISTERED: none  DRAINS: None  SPECIMEN:  none  INDICATION FOR PROCEDURE: This patient presented with back and bilateral leg pain with ambulation. Imaging showed severe stenosis L2-3. The patient tried conservative measures without relief. Pain was debilitating. Recommended a laminectomy L2-3. Patient understood the risks, benefits, and alternatives and potential outcomes and wished to proceed.  PROCEDURE DETAILS: The patient was taken to the operating room and after induction of adequate generalized endotracheal anesthesia, the patient was rolled into the prone position on the Wilson frame and all pressure points were padded. The lumbar region was cleaned and then prepped with DuraPrep and draped in the usual sterile fashion. 5 cc of local anesthesia was injected and then a dorsal midline incision was made and carried down to the lumbo sacral fascia. The fascia was opened and the paraspinous musculature was taken down in a subperiosteal fashion to expose L2-3 bilaterally. Intraoperative x-ray confirmed my level, and then I used Leksell rongeur to remove the inferior half of the spinous process and then a combination of the high-speed drill and the Kerrison punches to perform a laminectomy, medial facetectomy, and foraminotomy at L2-3 bilaterally. The underlying yellow ligament was opened and removed in a piecemeal fashion to expose the underlying dura and exiting nerve root. I undercut the lateral recess and dissected down until I was medial to and distal to the pedicle. The nerve root was well  decompressed on each side.  I then palpated with a coronary dilator along the nerve root and into the foramen to assure adequate decompression. I felt no more compression of the nerve root. I irrigated with saline solution containing bacitracin. Achieved hemostasis with bipolar cautery, lined the dura with Gelfoam, and then closed the fascia with 0 Vicryl. I closed the subcutaneous tissues with 2-0 Vicryl and the subcuticular tissues with 3-0 Vicryl. The skin was then closed with benzoin and Steri-Strips. The drapes were removed, a sterile dressing was applied. The patient was awakened from general anesthesia and transferred to the recovery room in stable condition. At the end of the procedure all sponge, needle and instrument counts were correct.    PLAN OF CARE: Admit for overnight observation  PATIENT DISPOSITION:  PACU - hemodynamically stable.   Delay start of Pharmacological VTE agent (>24hrs) due to surgical blood loss or risk of bleeding:  yes

## 2017-04-04 NOTE — Progress Notes (Signed)
Pt admitted to the unit from pacu via stretcher to room; pt A&O x4; pt ambulated to bed from stretcher; IV intact and transfusing; back incision has honeycomb dsg clean, dry and intact. Skin intact with no pressure ulcers or opened wounds except for back surgical incision. Pt oriented to the unit and room; fall/safety precaution and prevention education completed. VSS; Bed alarm on; neuro intact; scd's on; call light within reach and will closely monitor pt. Delia Heady RN

## 2017-04-04 NOTE — Anesthesia Postprocedure Evaluation (Signed)
Anesthesia Post Note  Patient: BRADAN CONGROVE  Procedure(s) Performed: Procedure(s) (LRB): Laminectomy and Foraminotomy - Lumbar Two-Three (N/A)  Patient location during evaluation: PACU Anesthesia Type: General Level of consciousness: awake and alert Pain management: pain level controlled Vital Signs Assessment: post-procedure vital signs reviewed and stable Respiratory status: spontaneous breathing, nonlabored ventilation, respiratory function stable and patient connected to nasal cannula oxygen Cardiovascular status: blood pressure returned to baseline and stable Postop Assessment: no signs of nausea or vomiting Anesthetic complications: no       Last Vitals:  Vitals:   04/04/17 1000 04/04/17 1015  BP: 120/69   Pulse: (!) 57 (!) 57  Resp: 16 17  Temp:      Last Pain:  Vitals:   04/04/17 0915  TempSrc:   PainSc: 0-No pain                 Rita Prom,W. EDMOND

## 2017-04-05 ENCOUNTER — Encounter (HOSPITAL_COMMUNITY): Payer: Self-pay | Admitting: Neurological Surgery

## 2017-04-05 DIAGNOSIS — M48061 Spinal stenosis, lumbar region without neurogenic claudication: Secondary | ICD-10-CM | POA: Diagnosis not present

## 2017-04-05 LAB — GLUCOSE, CAPILLARY: Glucose-Capillary: 124 mg/dL — ABNORMAL HIGH (ref 65–99)

## 2017-04-05 MED ORDER — HYDROCODONE-ACETAMINOPHEN 7.5-325 MG PO TABS: 1.0000 | ORAL_TABLET | Freq: Four times a day (QID) | ORAL | 0 refills | Status: DC

## 2017-04-05 MED ORDER — METHOCARBAMOL 500 MG PO TABS: 500.0000 mg | ORAL_TABLET | Freq: Four times a day (QID) | ORAL | 1 refills | Status: DC | PRN

## 2017-04-05 NOTE — Care Management Note (Signed)
Case Management Note  Patient Details  Name: MOISHE SCHELLENBERG MRN: [id number] Date of Birth: Jul 08, 1941  Subjective/Objective:  76 yr old gentleman s/p 2-3 lumbar Laminectomy.                 Action/Plan: Case manager spoke with patient and his wife concerning discharge plan and DME needs. Patient's wife has requested [facility] for therapy. Referral was given to Christa See, [facility]. Patient says he has a walker at home. No further needs identified.    Expected Discharge Date:  04/05/17               Expected Discharge Plan:  [place]  In-House Referral:  NA  Discharge planning Services  CM Consult  Post Acute Care Choice:  Home Health Choice offered to:  Spouse  DME Arranged:  N/A DME Agency:  NA  HH Arranged:  PT [place] Agency:  [facility] (formerly [employer])  Status of Service:  Completed, signed off  If discussed at [employer], dates discussed:    Additional Comments:  Ninfa Meeker, RN 04/05/2017, 10:14 AM

## 2017-04-05 NOTE — Discharge Summary (Signed)
Physician Discharge Summary  Patient ID: Anthony Hartman MRN: [id number] DOB/AGE: 76-Mar-1942 76 y.o.  Admit date: 04/04/2017 Discharge date: 04/05/2017  Admission Diagnoses: lumbar stenosis    Discharge Diagnoses: same   Discharged Condition: good  Hospital Course: The patient was admitted on 04/04/2017 and taken to the operating room where the patient underwent lumbar laminectomy L2-3. The patient tolerated the procedure well and was taken to the recovery room and then to the floor in stable condition. The hospital course was routine. There were no complications. The wound remained clean dry and intact. Pt had appropriate back soreness. No complaints of leg pain or new N/T/W. The patient remained afebrile with stable vital signs, and tolerated a regular diet. The patient continued to increase activities, and pain was well controlled with oral pain medications.   Consults: None  Significant Diagnostic Studies:  Results for orders placed or performed during the hospital encounter of 04/04/17  Glucose, capillary  Result Value Ref Range   Glucose-Capillary 92 65 - 99 mg/dL  Glucose, capillary  Result Value Ref Range   Glucose-Capillary 117 (H) 65 - 99 mg/dL  Glucose, capillary  Result Value Ref Range   Glucose-Capillary 124 (H) 65 - 99 mg/dL    Dg Lumbar Spine 2-3 Views  Result Date: 04/04/2017 CLINICAL DATA:  Laminectomy. EXAM: LUMBAR SPINE - 2-3 VIEW COMPARISON:  MRI of September 07, 2016. FINDINGS: Two intraoperative cross-table lateral projections were obtained of the lumbar spine. The first image demonstrates surgical probe directed toward the posterior elements of L2. The second image demonstrates surgical probe at approximately the L2-3 level. IMPRESSION: Surgical localization as described above. Electronically Signed   By: Marijo Conception, M.D.   On: 04/04/2017 09:53    Antibiotics:  Anti-infectives    Start     Dose/Rate Route Frequency Ordered Stop   04/04/17 1800   vancomycin (VANCOCIN) IVPB 1000 mg/200 mL premix     1,000 mg 200 mL/hr over 60 Minutes Intravenous  Once 04/04/17 1349 04/04/17 1845   04/04/17 1347  valACYclovir (VALTREX) tablet 1,000 mg     1,000 mg Oral 3 times daily PRN 04/04/17 1347     04/04/17 0844  bacitracin 50,000 Units in sodium chloride irrigation 0.9 % 500 mL irrigation  Status:  Discontinued       As needed 04/04/17 0844 04/04/17 0910   04/04/17 0530  vancomycin (VANCOCIN) 1,500 mg in sodium chloride 0.9 % 500 mL IVPB     1,500 mg 250 mL/hr over 120 Minutes Intravenous To [facility] 04/03/17 1414 04/04/17 0930      Discharge Exam: Blood pressure (!) 152/75, pulse 88, temperature 98.6 F (37 C), temperature source Oral, resp. rate 16, height 6\' 5"  (1.956 m), weight 102.5 kg (226 lb), SpO2 97 %. Neurologic: Grossly normal Dressing dry  Discharge Medications:   Allergies as of 04/05/2017      Reactions   Statins    MD ORDERS UNSPECIFIED REACTION     Augmentin [amoxicillin-pot Clavulanate] Nausea And Vomiting   Has patient had a PCN reaction causing immediate rash, facial/tongue/throat swelling, SOB or lightheadedness with hypotension: No Has patient had a PCN reaction causing severe rash involving mucus membranes or skin necrosis: No Has patient had a PCN reaction that required hospitalization No Has patient had a PCN reaction occurring within the last 10 years: No If all of the above answers are "NO", then may proceed with Cephalosporin use.   Dexamethasone Other (See Comments)   Frequent Urination [? HYPERGLYCEMIA? ]  Sulfa Antibiotics Nausea And Vomiting      Medication List    TAKE these medications   ALPRAZolam 0.5 MG tablet Commonly known as:  XANAX Take 0.25 mg by mouth at bedtime as needed for anxiety.   amLODipine 2.5 MG tablet Commonly known as:  NORVASC Take 2.5 mg by mouth daily.   aspirin 81 MG tablet Take 81 mg by mouth daily.   buPROPion 300 MG 24 hr tablet Commonly known as:   WELLBUTRIN XL Take 300 mg by mouth daily.   carvedilol 6.25 MG tablet Commonly known as:  COREG Take 6.25 mg by mouth 2 (two) times daily with a meal.   clopidogrel 75 MG tablet Commonly known as:  PLAVIX Take 75 mg by mouth daily.   COQ-10 PO Take 1 tablet by mouth daily.   ezetimibe 10 MG tablet Commonly known as:  ZETIA Take 10 mg by mouth at bedtime.   gabapentin 300 MG capsule Commonly known as:  NEURONTIN Take 300 mg by mouth every other day. At night   HYDROcodone-acetaminophen 7.5-325 MG tablet Commonly known as:  NORCO Take 1 tablet by mouth every 6 (six) hours.   isosorbide mononitrate 30 MG 24 hr tablet Commonly known as:  IMDUR Take 30 mg by mouth daily.   losartan 25 MG tablet Commonly known as:  COZAAR Take 25 mg by mouth at bedtime.   LUBRICATING EYE DROPS OP Apply 1 drop to eye daily as needed (dry eyes).   methocarbamol 500 MG tablet Commonly known as:  ROBAXIN Take 1 tablet (500 mg total) by mouth every 6 (six) hours as needed for muscle spasms.   multivitamin with minerals Tabs tablet Take 1 tablet by mouth daily.   nitroGLYCERIN 0.4 MG SL tablet Commonly known as:  NITROSTAT Place 0.4 mg under the tongue every 5 (five) minutes as needed for chest pain.   omeprazole 20 MG capsule Commonly known as:  PRILOSEC Take 20 mg by mouth every morning. Reported on 03/08/2016   traMADol 50 MG tablet Commonly known as:  ULTRAM Take 50 mg by mouth daily as needed for pain.   valACYclovir 1000 MG tablet Commonly known as:  VALTREX Take 1,000 mg by mouth 3 (three) times daily as needed (fever blisters).       Disposition: home   Final Dx: LL for stenosis  Discharge Instructions     Remove dressing in 72 hours    Complete by:  As directed    Call MD for:  difficulty breathing, headache or visual disturbances    Complete by:  As directed    Call MD for:  persistant nausea and vomiting    Complete by:  As directed    Call MD for:  redness,  tenderness, or signs of infection (pain, swelling, redness, odor or green/yellow discharge around incision site)    Complete by:  As directed    Call MD for:  severe uncontrolled pain    Complete by:  As directed    Call MD for:  temperature >100.4    Complete by:  As directed    Diet - low sodium heart healthy    Complete by:  As directed    Increase activity slowly    Complete by:  As directed       Follow-up Information    Eustace Moore, MD. Schedule an appointment as soon as possible for a visit in 2 week(s).   Specialty:  Neurosurgery Contact information: [address] [place] [address] [phone]            Signed: Eustace Moore 04/05/2017, 7:40 AM

## 2017-04-10 DIAGNOSIS — Z955 Presence of coronary angioplasty implant and graft: Secondary | ICD-10-CM | POA: Diagnosis not present

## 2017-04-10 DIAGNOSIS — Z87891 Personal history of nicotine dependence: Secondary | ICD-10-CM | POA: Diagnosis not present

## 2017-04-10 DIAGNOSIS — I10 Essential (primary) hypertension: Secondary | ICD-10-CM | POA: Diagnosis not present

## 2017-04-10 DIAGNOSIS — I2510 Atherosclerotic heart disease of native coronary artery without angina pectoris: Secondary | ICD-10-CM | POA: Diagnosis not present

## 2017-04-10 DIAGNOSIS — Z8546 Personal history of malignant neoplasm of prostate: Secondary | ICD-10-CM | POA: Diagnosis not present

## 2017-04-10 DIAGNOSIS — F329 Major depressive disorder, single episode, unspecified: Secondary | ICD-10-CM | POA: Diagnosis not present

## 2017-04-10 DIAGNOSIS — Z7902 Long term (current) use of antithrombotics/antiplatelets: Secondary | ICD-10-CM | POA: Diagnosis not present

## 2017-04-10 DIAGNOSIS — Z7982 Long term (current) use of aspirin: Secondary | ICD-10-CM | POA: Diagnosis not present

## 2017-04-10 DIAGNOSIS — Z4789 Encounter for other orthopedic aftercare: Secondary | ICD-10-CM | POA: Diagnosis not present

## 2017-04-10 DIAGNOSIS — I252 Old myocardial infarction: Secondary | ICD-10-CM | POA: Diagnosis not present

## 2017-04-10 DIAGNOSIS — Z8551 Personal history of malignant neoplasm of bladder: Secondary | ICD-10-CM | POA: Diagnosis not present

## 2017-04-10 DIAGNOSIS — Z9079 Acquired absence of other genital organ(s): Secondary | ICD-10-CM | POA: Diagnosis not present

## 2017-04-10 DIAGNOSIS — E785 Hyperlipidemia, unspecified: Secondary | ICD-10-CM | POA: Diagnosis not present

## 2017-04-15 ENCOUNTER — Encounter: Payer: Self-pay | Admitting: Podiatry

## 2017-04-15 ENCOUNTER — Ambulatory Visit (INDEPENDENT_AMBULATORY_CARE_PROVIDER_SITE_OTHER): Payer: PPO | Admitting: Podiatry

## 2017-04-15 VITALS — BP 134/63 | HR 57 | Ht 76.0 in | Wt 230.0 lb

## 2017-04-15 DIAGNOSIS — M21619 Bunion of unspecified foot: Secondary | ICD-10-CM | POA: Diagnosis not present

## 2017-04-15 DIAGNOSIS — M2010 Hallux valgus (acquired), unspecified foot: Secondary | ICD-10-CM | POA: Diagnosis not present

## 2017-04-15 DIAGNOSIS — M2041 Other hammer toe(s) (acquired), right foot: Secondary | ICD-10-CM | POA: Diagnosis not present

## 2017-04-15 NOTE — Progress Notes (Signed)
SUBJECTIVE: 76 y.o. year old male presents accompanied by his wife with painful toe 2nd right. Patient is seeking surgical consultation of right foot bunion, turning great toe, and 2nd hammer toe.  Heart attach in Nov 2017,  Prostate cancer 2017. Bladder cancer several years ago.  REVIEW OF SYSTEMS: A comprehensive review of systems was negative except for: History o fheart attack November 2017, Prostate cancer 2017, Bladder caner several years ago.   OBJECTIVE: DERMATOLOGIC EXAMINATION: Red friction mark over the 2nd digit PIPJ right.  VASCULAR EXAMINATION OF LOWER LIMBS: All pedal pulses are palpable with normal pulsation.  Capillary Filling times within 3 seconds in all digits.  Temperature gradient from tibial crest to dorsum of foot is within normal bilateral.  NEUROLOGIC EXAMINATION OF THE LOWER LIMBS: Achilles DTR is present and within normal. Monofilament (Semmes-Weinstein 10-gm) sensory testing positive 6 out of 6, bilateral. Vibratory sensations(128Hz  turning fork) intact at medial and lateral forefoot bilateral.  Sharp and Dull discriminatory sensations at the plantar ball of hallux is intact bilateral.   MUSCULOSKELETAL EXAMINATION: Positive for Hallux valgus with bunion deformity bilateral. Severe contracted 2nd digit with pain right. Tight EHL noted on right foot.   Radiographic examination of the right foot reveal irregular and enlarged metatarsal head, laterally deviated hallux with short first ray. Fibular sesamoid position at 7. Long 2nd metatarsal and phalangeal bone with severe PIPJ contracture.  ASSESSMENT: Painful Hammer toe with pre ulcerative lesion at PIPJ right. Hallux valgus with bunion bilateral.  PLAN: Reviewed clinical findings and available treatment options. Patient is on Plavix with heart condition. He is scheduled with his PCP tomorrow and will get medical clearance prior to surgery. Possible Hammer toe repair 2nd right pending medical  clearance.

## 2017-04-15 NOTE — Patient Instructions (Signed)
Seen for painful hammer toe with bunion deformity right. Reviewed possible surgical options. May benefit from Hammer toe repair 2nd with Extenso tendon lengthening on the great toe for now. Return with medical clearance.

## 2017-04-18 DIAGNOSIS — Z9079 Acquired absence of other genital organ(s): Secondary | ICD-10-CM | POA: Diagnosis not present

## 2017-04-18 DIAGNOSIS — I10 Essential (primary) hypertension: Secondary | ICD-10-CM | POA: Diagnosis not present

## 2017-04-18 DIAGNOSIS — Z955 Presence of coronary angioplasty implant and graft: Secondary | ICD-10-CM | POA: Diagnosis not present

## 2017-04-18 DIAGNOSIS — Z7902 Long term (current) use of antithrombotics/antiplatelets: Secondary | ICD-10-CM | POA: Diagnosis not present

## 2017-04-18 DIAGNOSIS — E785 Hyperlipidemia, unspecified: Secondary | ICD-10-CM | POA: Diagnosis not present

## 2017-04-18 DIAGNOSIS — I2510 Atherosclerotic heart disease of native coronary artery without angina pectoris: Secondary | ICD-10-CM | POA: Diagnosis not present

## 2017-04-18 DIAGNOSIS — F329 Major depressive disorder, single episode, unspecified: Secondary | ICD-10-CM | POA: Diagnosis not present

## 2017-04-18 DIAGNOSIS — Z8551 Personal history of malignant neoplasm of bladder: Secondary | ICD-10-CM | POA: Diagnosis not present

## 2017-04-18 DIAGNOSIS — I252 Old myocardial infarction: Secondary | ICD-10-CM | POA: Diagnosis not present

## 2017-04-18 DIAGNOSIS — Z4789 Encounter for other orthopedic aftercare: Secondary | ICD-10-CM | POA: Diagnosis not present

## 2017-04-18 DIAGNOSIS — Z7982 Long term (current) use of aspirin: Secondary | ICD-10-CM | POA: Diagnosis not present

## 2017-04-18 DIAGNOSIS — Z87891 Personal history of nicotine dependence: Secondary | ICD-10-CM | POA: Diagnosis not present

## 2017-04-18 DIAGNOSIS — Z8546 Personal history of malignant neoplasm of prostate: Secondary | ICD-10-CM | POA: Diagnosis not present

## 2017-04-19 ENCOUNTER — Telehealth: Payer: Self-pay | Admitting: *Deleted

## 2017-04-19 NOTE — Telephone Encounter (Signed)
04/19/17 Dr. Caffie Pinto, Patient called this afternoon and said Dr.Chiu request a note from you saying what [facility] pt needs, then they will give clearance for him to have the surgery. We need to fax the note to [phone]

## 2017-04-26 DIAGNOSIS — I252 Old myocardial infarction: Secondary | ICD-10-CM | POA: Diagnosis not present

## 2017-04-26 DIAGNOSIS — Z7982 Long term (current) use of aspirin: Secondary | ICD-10-CM | POA: Diagnosis not present

## 2017-04-26 DIAGNOSIS — Z955 Presence of coronary angioplasty implant and graft: Secondary | ICD-10-CM | POA: Diagnosis not present

## 2017-04-26 DIAGNOSIS — Z87891 Personal history of nicotine dependence: Secondary | ICD-10-CM | POA: Diagnosis not present

## 2017-04-26 DIAGNOSIS — I10 Essential (primary) hypertension: Secondary | ICD-10-CM | POA: Diagnosis not present

## 2017-04-26 DIAGNOSIS — E785 Hyperlipidemia, unspecified: Secondary | ICD-10-CM | POA: Diagnosis not present

## 2017-04-26 DIAGNOSIS — Z8551 Personal history of malignant neoplasm of bladder: Secondary | ICD-10-CM | POA: Diagnosis not present

## 2017-04-26 DIAGNOSIS — Z8546 Personal history of malignant neoplasm of prostate: Secondary | ICD-10-CM | POA: Diagnosis not present

## 2017-04-26 DIAGNOSIS — F329 Major depressive disorder, single episode, unspecified: Secondary | ICD-10-CM | POA: Diagnosis not present

## 2017-04-26 DIAGNOSIS — Z7902 Long term (current) use of antithrombotics/antiplatelets: Secondary | ICD-10-CM | POA: Diagnosis not present

## 2017-04-26 DIAGNOSIS — Z9079 Acquired absence of other genital organ(s): Secondary | ICD-10-CM | POA: Diagnosis not present

## 2017-04-26 DIAGNOSIS — Z4789 Encounter for other orthopedic aftercare: Secondary | ICD-10-CM | POA: Diagnosis not present

## 2017-04-26 DIAGNOSIS — I2510 Atherosclerotic heart disease of native coronary artery without angina pectoris: Secondary | ICD-10-CM | POA: Diagnosis not present

## 2017-04-29 DIAGNOSIS — K219 Gastro-esophageal reflux disease without esophagitis: Secondary | ICD-10-CM | POA: Diagnosis not present

## 2017-04-29 DIAGNOSIS — R490 Dysphonia: Secondary | ICD-10-CM | POA: Diagnosis not present

## 2017-05-07 ENCOUNTER — Ambulatory Visit (INDEPENDENT_AMBULATORY_CARE_PROVIDER_SITE_OTHER): Payer: PPO | Admitting: Podiatry

## 2017-05-07 ENCOUNTER — Encounter: Payer: Self-pay | Admitting: Podiatry

## 2017-05-07 VITALS — BP 124/66 | HR 60 | Ht 76.0 in | Wt 235.0 lb

## 2017-05-07 DIAGNOSIS — M2010 Hallux valgus (acquired), unspecified foot: Secondary | ICD-10-CM | POA: Diagnosis not present

## 2017-05-07 DIAGNOSIS — M79671 Pain in right foot: Secondary | ICD-10-CM

## 2017-05-07 DIAGNOSIS — M2041 Other hammer toe(s) (acquired), right foot: Secondary | ICD-10-CM

## 2017-05-07 NOTE — Patient Instructions (Addendum)
Seen for painful hammer toe 2nd right and contracted right great toe. As per request consent form reviewed for hammer toe repair 2nd right and EHL tendon lengthening right. Will put on schedule pending prior approval.  Lesion debrided and padded 2nd toe right. Dispensed a sheet of corn pad.

## 2017-05-07 NOTE — Progress Notes (Signed)
SUBJECTIVE: 76 y.o. year old male presents for surgical consult on painful 2nd toe right. He was seen by his PCP and was cleared for foot surgery. He can be off of Plavix 4-5 days prior to the surgery.  Patient was referred by Dr. Leonette Nutting for possible foot surgery.   HPI: Recent history of red pre ulcerative hammer toe 2nd right that has been resolved.  Positive history of back surgery earlier this month and recovering well. Heart attach in Nov 2017,  Prostate cancer 2017. Bladder cancer several years ago.  REVIEW OF SYSTEMS: A comprehensive review of systems was negative except for: History o fheart attack November 2017, Prostate cancer 2017, Bladder caner several years ago, back surgery 3 weeks ago.   OBJECTIVE: DERMATOLOGIC EXAMINATION: Dry keratotic scab covered over the 2nd digit PIPJ right.  VASCULAR EXAMINATION OF LOWER LIMBS: All pedal pulses are palpable with normal pulsation.  Capillary Filling times within 3 seconds in all digits.  Temperature gradient from tibial crest to dorsum of foot is within normal bilateral.  NEUROLOGIC EXAMINATION OF THE LOWER LIMBS: Achilles DTR is present and within normal. Monofilament (Semmes-Weinstein 10-gm) sensory testing positive 6 out of 6, bilateral. Vibratory sensations(128Hz  turning fork) intact at medial and lateral forefoot bilateral.  Sharp and Dull discriminatory sensations at the plantar ball of hallux is intact bilateral.   MUSCULOSKELETAL EXAMINATION: Positive for Hallux valgus with bunion deformity bilateral. Severe contracted 2nd digit with pain right. Tight Extenso halluces Longus (EHL) tendon causing the great toe press against the 2nd digit right.   Last x-ray findings include irregular surface with enlarged metatarsal head, laterally deviated hallux with short first ray. Fibular sesamoid position at 7. Long 2nd metatarsal and phalangeal bone with severe PIPJ contracture.  ASSESSMENT: Painful Hammer toe with  pre ulcerative lesion at PIPJ right. Hallux valgus with bunion bilateral.  Severe EHL contracture causing the right great toe lifting the 2nd toe.   PLAN: Reviewed clinical findings and available treatment options. 2nd digit right lesion debrided and padded. Reviewed surgery consent form for Hammer toe repair 2nd right and EHL tendon lengthening to prevent overlapping 1st and 2nd digit right.                                                                                        +++++[id number]++++++++[phone]++[id number]++++++++++++++++++++++++++                                  ++++++++++++++++       ++.+.. Has had back surgery 4 weeks ago and still in pain.  Heart attach in Nov 2017,  Prostate cancer 2017. Bladder cancer several years ago.  REVIEW OF SYSTEMS: A comprehensive review of systems was negative except for: History o fheart attack November 2017, Prostate cancer 2017, Bladder caner several years ago.   OBJECTIVE: DERMATOLOGIC EXAMINATION: Red friction mark over the 2nd digit PIPJ right.  VASCULAR EXAMINATION OF LOWER LIMBS: All pedal pulses are palpable with normal pulsation.  Capillary Filling times within 3 seconds in all digits.  Temperature gradient from tibial crest to dorsum of foot is within  normal bilateral.  NEUROLOGIC EXAMINATION OF THE LOWER LIMBS: Achilles DTR is present and within normal. Monofilament (Semmes-Weinstein 10-gm) sensory testing positive 6 out of 6, bilateral. Vibratory sensations(128Hz  turning fork) intact at medial and lateral forefoot bilateral.  Sharp and Dull discriminatory sensations at the plantar ball of hallux is intact bilateral.   MUSCULOSKELETAL EXAMINATION: Positive for Hallux valgus with bunion deformity bilateral. Severe contracted 2nd digit with pain right. Tight EHL noted on right foot.   Radiographic examination  of the right foot reveal irregular and enlarged metatarsal head, laterally deviated hallux with short first ray. Fibular sesamoid position at 7. Long 2nd metatarsal and phalangeal bone with severe PIPJ contracture.  ASSESSMENT: Painful Hammer toe with pre ulcerative lesion at PIPJ right. Hallux valgus with bunion bilateral.  PLAN: Reviewed clinical findings and available treatment options. Patient is on Plavix with heart condition. He is scheduled with his PCP tomorrow and will get medical clearance prior to surgery. Possible Hammer toe repair 2nd right pending medical clearance.

## 2017-05-09 ENCOUNTER — Other Ambulatory Visit: Payer: Self-pay | Admitting: Podiatry

## 2017-05-09 MED ORDER — HYDROCODONE-ACETAMINOPHEN 7.5-325 MG PO TABS: 1.0000 | ORAL_TABLET | Freq: Four times a day (QID) | ORAL | 0 refills | Status: DC | PRN

## 2017-05-10 DIAGNOSIS — M2041 Other hammer toe(s) (acquired), right foot: Secondary | ICD-10-CM | POA: Diagnosis not present

## 2017-05-10 DIAGNOSIS — M2011 Hallux valgus (acquired), right foot: Secondary | ICD-10-CM | POA: Diagnosis not present

## 2017-05-10 DIAGNOSIS — M2042 Other hammer toe(s) (acquired), left foot: Secondary | ICD-10-CM | POA: Diagnosis not present

## 2017-05-10 HISTORY — PX: OTHER SURGICAL HISTORY: SHX169

## 2017-05-14 ENCOUNTER — Ambulatory Visit (INDEPENDENT_AMBULATORY_CARE_PROVIDER_SITE_OTHER): Payer: PPO | Admitting: Podiatry

## 2017-05-14 DIAGNOSIS — Z9889 Other specified postprocedural states: Secondary | ICD-10-CM

## 2017-05-14 NOTE — Patient Instructions (Signed)
4 days post op wound healing normal. Dressing changed. Return in one week.

## 2017-05-14 NOTE — Progress Notes (Signed)
4 days post op following Hammer toe surgery and EHL lengthening right foot (05/03/17). Clean dry wound. No edema or erythema noted. Dressing change done. Return in one week.

## 2017-05-16 ENCOUNTER — Encounter: Payer: Self-pay | Admitting: Podiatry

## 2017-05-16 ENCOUNTER — Encounter: Payer: PPO | Admitting: Podiatry

## 2017-05-22 ENCOUNTER — Encounter: Payer: Self-pay | Admitting: Podiatry

## 2017-05-22 ENCOUNTER — Ambulatory Visit (INDEPENDENT_AMBULATORY_CARE_PROVIDER_SITE_OTHER): Payer: PPO | Admitting: Podiatry

## 2017-05-22 DIAGNOSIS — Z9889 Other specified postprocedural states: Secondary | ICD-10-CM

## 2017-05-22 NOTE — Patient Instructions (Signed)
Normal wound healing following hammer toe surgery and tendon lengthening. Suture removed. Home care instruction given with supply. Return in 2 weeks.

## 2017-05-22 NOTE — Progress Notes (Signed)
19 days post op following Hammer toe surgery and EHL lengthening right foot (05/03/17). Minimum discomfort. Incision sites dry and healed well. No edema or erythema noted. All sutures removed. Mefix tape placed with home care instruction with tube foam and compression stockinet. Return in 2 weeks.

## 2017-05-23 DIAGNOSIS — H10412 Chronic giant papillary conjunctivitis, left eye: Secondary | ICD-10-CM | POA: Diagnosis not present

## 2017-05-28 DIAGNOSIS — J302 Other seasonal allergic rhinitis: Secondary | ICD-10-CM | POA: Diagnosis not present

## 2017-05-30 ENCOUNTER — Ambulatory Visit (INDEPENDENT_AMBULATORY_CARE_PROVIDER_SITE_OTHER): Payer: PPO | Admitting: Podiatry

## 2017-05-30 ENCOUNTER — Encounter: Payer: Self-pay | Admitting: Podiatry

## 2017-05-30 DIAGNOSIS — Z9889 Other specified postprocedural states: Secondary | ICD-10-CM

## 2017-05-30 NOTE — Progress Notes (Signed)
4 weeks post op following Hammer toe surgery and EHL lengthening right foot (05/03/17). Minimum discomfort but having swelling in ankle and the toe. Incision sites dry and healed well.  Noted of right ankle edema and enlarged surgical toe 2nd right. Noted of plantar flexion of IPJ right hallux following EHL lengthening. Post op X-ray finding is consistent with the surgery performed. Reviewed findings and nature of the condition.  Mefix tape and coban bandage placed with home care instruction. Return as needed.

## 2017-05-30 NOTE — Patient Instructions (Signed)
4 weeks post op wound healing with residual edema in ankle and toe.  Contraction at IPJ of the hallux due to weakened EHL noted. Return as needed.

## 2017-06-03 DIAGNOSIS — R2689 Other abnormalities of gait and mobility: Secondary | ICD-10-CM | POA: Diagnosis not present

## 2017-06-03 DIAGNOSIS — M545 Low back pain: Secondary | ICD-10-CM | POA: Diagnosis not present

## 2017-06-03 DIAGNOSIS — M6281 Muscle weakness (generalized): Secondary | ICD-10-CM | POA: Diagnosis not present

## 2017-06-06 DIAGNOSIS — M545 Low back pain: Secondary | ICD-10-CM | POA: Diagnosis not present

## 2017-06-06 DIAGNOSIS — M6281 Muscle weakness (generalized): Secondary | ICD-10-CM | POA: Diagnosis not present

## 2017-06-06 DIAGNOSIS — R2689 Other abnormalities of gait and mobility: Secondary | ICD-10-CM | POA: Diagnosis not present

## 2017-06-10 ENCOUNTER — Encounter: Payer: PPO | Admitting: Podiatry

## 2017-06-10 DIAGNOSIS — C61 Malignant neoplasm of prostate: Secondary | ICD-10-CM | POA: Diagnosis not present

## 2017-06-10 DIAGNOSIS — Z8551 Personal history of malignant neoplasm of bladder: Secondary | ICD-10-CM | POA: Diagnosis not present

## 2017-06-10 DIAGNOSIS — M170 Bilateral primary osteoarthritis of knee: Secondary | ICD-10-CM | POA: Diagnosis not present

## 2017-06-12 DIAGNOSIS — R2689 Other abnormalities of gait and mobility: Secondary | ICD-10-CM | POA: Diagnosis not present

## 2017-06-12 DIAGNOSIS — M545 Low back pain: Secondary | ICD-10-CM | POA: Diagnosis not present

## 2017-06-12 DIAGNOSIS — M6281 Muscle weakness (generalized): Secondary | ICD-10-CM | POA: Diagnosis not present

## 2017-06-14 DIAGNOSIS — M6281 Muscle weakness (generalized): Secondary | ICD-10-CM | POA: Diagnosis not present

## 2017-06-14 DIAGNOSIS — M545 Low back pain: Secondary | ICD-10-CM | POA: Diagnosis not present

## 2017-06-14 DIAGNOSIS — R2689 Other abnormalities of gait and mobility: Secondary | ICD-10-CM | POA: Diagnosis not present

## 2017-06-19 DIAGNOSIS — M545 Low back pain: Secondary | ICD-10-CM | POA: Diagnosis not present

## 2017-06-19 DIAGNOSIS — R2689 Other abnormalities of gait and mobility: Secondary | ICD-10-CM | POA: Diagnosis not present

## 2017-06-19 DIAGNOSIS — M6281 Muscle weakness (generalized): Secondary | ICD-10-CM | POA: Diagnosis not present

## 2017-06-21 DIAGNOSIS — R2689 Other abnormalities of gait and mobility: Secondary | ICD-10-CM | POA: Diagnosis not present

## 2017-06-21 DIAGNOSIS — M545 Low back pain: Secondary | ICD-10-CM | POA: Diagnosis not present

## 2017-06-21 DIAGNOSIS — M6281 Muscle weakness (generalized): Secondary | ICD-10-CM | POA: Diagnosis not present

## 2017-06-25 DIAGNOSIS — M545 Low back pain: Secondary | ICD-10-CM | POA: Diagnosis not present

## 2017-06-25 DIAGNOSIS — M6281 Muscle weakness (generalized): Secondary | ICD-10-CM | POA: Diagnosis not present

## 2017-06-25 DIAGNOSIS — R2689 Other abnormalities of gait and mobility: Secondary | ICD-10-CM | POA: Diagnosis not present

## 2017-06-27 DIAGNOSIS — R2689 Other abnormalities of gait and mobility: Secondary | ICD-10-CM | POA: Diagnosis not present

## 2017-06-27 DIAGNOSIS — M545 Low back pain: Secondary | ICD-10-CM | POA: Diagnosis not present

## 2017-06-27 DIAGNOSIS — M6281 Muscle weakness (generalized): Secondary | ICD-10-CM | POA: Diagnosis not present

## 2017-07-02 ENCOUNTER — Encounter: Payer: Self-pay | Admitting: Podiatry

## 2017-07-02 ENCOUNTER — Ambulatory Visit (INDEPENDENT_AMBULATORY_CARE_PROVIDER_SITE_OTHER): Payer: PPO | Admitting: Podiatry

## 2017-07-02 DIAGNOSIS — Z9889 Other specified postprocedural states: Secondary | ICD-10-CM

## 2017-07-02 NOTE — Progress Notes (Signed)
Subjective: 76 year old male presents accompanied by his wife for 8 weeks post op following Hammer toe surgery and EHL lengthening right foot (05/03/17). Stated that he is having problem with the great toe going under the 2nd toe. The 2nd toe gets red and painful at times.  Objective: Neurovascular status are within normal with good palpable pedal pulses on right foot. 2nd digit right is in rectus position and flattened top. Positive of over powering Flexor halluces longus tendon with IPJ contracture and lateral deviation. The great toe curls under the 2nd toe upon weight bearing, raised the 2nd toe to get rubbed against toe box. Severe bunion deformity with progressive valgus rotation of hallux right foot.  Assessment: IPJ contracture with lateral deviation of right great toe, curls under 2nd toe and elevates the digit to rub against shoe. Maintained correction of right 2nd toe.  Plan: Reviewed findings and available treatment options.  Buddy strap placed to keep the great toe straight. Supply dispensed. May benefit from fusion of IPJ right great toe to prevent further progression of the deformity.

## 2017-07-02 NOTE — Patient Instructions (Signed)
8 week post op wound healing complete. Progressive deformity right great toe. Reviewed fusion of IPJ right great toe to prevent further deviation. Return for pre op.

## 2017-07-04 DIAGNOSIS — M6281 Muscle weakness (generalized): Secondary | ICD-10-CM | POA: Diagnosis not present

## 2017-07-04 DIAGNOSIS — M545 Low back pain: Secondary | ICD-10-CM | POA: Diagnosis not present

## 2017-07-04 DIAGNOSIS — R2689 Other abnormalities of gait and mobility: Secondary | ICD-10-CM | POA: Diagnosis not present

## 2017-07-09 DIAGNOSIS — M6281 Muscle weakness (generalized): Secondary | ICD-10-CM | POA: Diagnosis not present

## 2017-07-09 DIAGNOSIS — M545 Low back pain: Secondary | ICD-10-CM | POA: Diagnosis not present

## 2017-07-09 DIAGNOSIS — R2689 Other abnormalities of gait and mobility: Secondary | ICD-10-CM | POA: Diagnosis not present

## 2017-07-11 DIAGNOSIS — M6281 Muscle weakness (generalized): Secondary | ICD-10-CM | POA: Diagnosis not present

## 2017-07-11 DIAGNOSIS — M545 Low back pain: Secondary | ICD-10-CM | POA: Diagnosis not present

## 2017-07-11 DIAGNOSIS — R2689 Other abnormalities of gait and mobility: Secondary | ICD-10-CM | POA: Diagnosis not present

## 2017-07-18 DIAGNOSIS — M545 Low back pain: Secondary | ICD-10-CM | POA: Diagnosis not present

## 2017-07-18 DIAGNOSIS — M6281 Muscle weakness (generalized): Secondary | ICD-10-CM | POA: Diagnosis not present

## 2017-07-18 DIAGNOSIS — R2689 Other abnormalities of gait and mobility: Secondary | ICD-10-CM | POA: Diagnosis not present

## 2017-07-19 DIAGNOSIS — M545 Low back pain: Secondary | ICD-10-CM | POA: Diagnosis not present

## 2017-07-19 DIAGNOSIS — I10 Essential (primary) hypertension: Secondary | ICD-10-CM | POA: Diagnosis not present

## 2017-07-19 DIAGNOSIS — Z6827 Body mass index (BMI) 27.0-27.9, adult: Secondary | ICD-10-CM | POA: Diagnosis not present

## 2017-07-19 DIAGNOSIS — G8929 Other chronic pain: Secondary | ICD-10-CM | POA: Diagnosis not present

## 2017-07-26 DIAGNOSIS — I10 Essential (primary) hypertension: Secondary | ICD-10-CM | POA: Diagnosis not present

## 2017-07-26 DIAGNOSIS — M545 Low back pain: Secondary | ICD-10-CM | POA: Diagnosis not present

## 2017-07-26 DIAGNOSIS — I2510 Atherosclerotic heart disease of native coronary artery without angina pectoris: Secondary | ICD-10-CM | POA: Diagnosis not present

## 2017-07-26 DIAGNOSIS — E782 Mixed hyperlipidemia: Secondary | ICD-10-CM | POA: Diagnosis not present

## 2017-07-26 DIAGNOSIS — E119 Type 2 diabetes mellitus without complications: Secondary | ICD-10-CM | POA: Diagnosis not present

## 2017-07-30 DIAGNOSIS — M47816 Spondylosis without myelopathy or radiculopathy, lumbar region: Secondary | ICD-10-CM | POA: Diagnosis not present

## 2017-07-30 DIAGNOSIS — M545 Low back pain: Secondary | ICD-10-CM | POA: Diagnosis not present

## 2017-08-05 DIAGNOSIS — M21611 Bunion of right foot: Secondary | ICD-10-CM | POA: Diagnosis not present

## 2017-08-05 DIAGNOSIS — M2041 Other hammer toe(s) (acquired), right foot: Secondary | ICD-10-CM | POA: Diagnosis not present

## 2017-08-05 DIAGNOSIS — M2021 Hallux rigidus, right foot: Secondary | ICD-10-CM | POA: Diagnosis not present

## 2017-08-06 DIAGNOSIS — M545 Low back pain: Secondary | ICD-10-CM | POA: Diagnosis not present

## 2017-08-08 DIAGNOSIS — Z23 Encounter for immunization: Secondary | ICD-10-CM | POA: Diagnosis not present

## 2017-08-08 DIAGNOSIS — I2510 Atherosclerotic heart disease of native coronary artery without angina pectoris: Secondary | ICD-10-CM | POA: Diagnosis not present

## 2017-08-08 DIAGNOSIS — K219 Gastro-esophageal reflux disease without esophagitis: Secondary | ICD-10-CM | POA: Diagnosis not present

## 2017-08-08 DIAGNOSIS — Z79899 Other long term (current) drug therapy: Secondary | ICD-10-CM | POA: Diagnosis not present

## 2017-08-08 DIAGNOSIS — R7303 Prediabetes: Secondary | ICD-10-CM | POA: Diagnosis not present

## 2017-08-08 DIAGNOSIS — D638 Anemia in other chronic diseases classified elsewhere: Secondary | ICD-10-CM | POA: Diagnosis not present

## 2017-08-08 DIAGNOSIS — I10 Essential (primary) hypertension: Secondary | ICD-10-CM | POA: Diagnosis not present

## 2017-08-13 DIAGNOSIS — I10 Essential (primary) hypertension: Secondary | ICD-10-CM | POA: Diagnosis not present

## 2017-08-13 DIAGNOSIS — R262 Difficulty in walking, not elsewhere classified: Secondary | ICD-10-CM | POA: Diagnosis not present

## 2017-08-13 DIAGNOSIS — M6281 Muscle weakness (generalized): Secondary | ICD-10-CM | POA: Diagnosis not present

## 2017-08-13 DIAGNOSIS — M545 Low back pain: Secondary | ICD-10-CM | POA: Diagnosis not present

## 2017-08-14 DIAGNOSIS — R262 Difficulty in walking, not elsewhere classified: Secondary | ICD-10-CM | POA: Diagnosis not present

## 2017-08-14 DIAGNOSIS — I10 Essential (primary) hypertension: Secondary | ICD-10-CM | POA: Diagnosis not present

## 2017-08-14 DIAGNOSIS — M545 Low back pain: Secondary | ICD-10-CM | POA: Diagnosis not present

## 2017-08-14 DIAGNOSIS — M6281 Muscle weakness (generalized): Secondary | ICD-10-CM | POA: Diagnosis not present

## 2017-08-16 DIAGNOSIS — M6281 Muscle weakness (generalized): Secondary | ICD-10-CM | POA: Diagnosis not present

## 2017-08-16 DIAGNOSIS — I10 Essential (primary) hypertension: Secondary | ICD-10-CM | POA: Diagnosis not present

## 2017-08-16 DIAGNOSIS — M545 Low back pain: Secondary | ICD-10-CM | POA: Diagnosis not present

## 2017-08-16 DIAGNOSIS — R262 Difficulty in walking, not elsewhere classified: Secondary | ICD-10-CM | POA: Diagnosis not present

## 2017-08-19 DIAGNOSIS — M6281 Muscle weakness (generalized): Secondary | ICD-10-CM | POA: Diagnosis not present

## 2017-08-19 DIAGNOSIS — M545 Low back pain: Secondary | ICD-10-CM | POA: Diagnosis not present

## 2017-08-19 DIAGNOSIS — R262 Difficulty in walking, not elsewhere classified: Secondary | ICD-10-CM | POA: Diagnosis not present

## 2017-08-19 DIAGNOSIS — I10 Essential (primary) hypertension: Secondary | ICD-10-CM | POA: Diagnosis not present

## 2017-08-21 DIAGNOSIS — I10 Essential (primary) hypertension: Secondary | ICD-10-CM | POA: Diagnosis not present

## 2017-08-21 DIAGNOSIS — M6281 Muscle weakness (generalized): Secondary | ICD-10-CM | POA: Diagnosis not present

## 2017-08-21 DIAGNOSIS — R262 Difficulty in walking, not elsewhere classified: Secondary | ICD-10-CM | POA: Diagnosis not present

## 2017-08-21 DIAGNOSIS — M545 Low back pain: Secondary | ICD-10-CM | POA: Diagnosis not present

## 2017-08-23 DIAGNOSIS — R262 Difficulty in walking, not elsewhere classified: Secondary | ICD-10-CM | POA: Diagnosis not present

## 2017-08-23 DIAGNOSIS — M6281 Muscle weakness (generalized): Secondary | ICD-10-CM | POA: Diagnosis not present

## 2017-08-23 DIAGNOSIS — I10 Essential (primary) hypertension: Secondary | ICD-10-CM | POA: Diagnosis not present

## 2017-08-23 DIAGNOSIS — M545 Low back pain: Secondary | ICD-10-CM | POA: Diagnosis not present

## 2017-08-26 DIAGNOSIS — M6281 Muscle weakness (generalized): Secondary | ICD-10-CM | POA: Diagnosis not present

## 2017-08-26 DIAGNOSIS — M545 Low back pain: Secondary | ICD-10-CM | POA: Diagnosis not present

## 2017-08-26 DIAGNOSIS — I10 Essential (primary) hypertension: Secondary | ICD-10-CM | POA: Diagnosis not present

## 2017-08-26 DIAGNOSIS — R262 Difficulty in walking, not elsewhere classified: Secondary | ICD-10-CM | POA: Diagnosis not present

## 2017-08-28 DIAGNOSIS — M461 Sacroiliitis, not elsewhere classified: Secondary | ICD-10-CM | POA: Diagnosis not present

## 2017-08-30 DIAGNOSIS — M545 Low back pain: Secondary | ICD-10-CM | POA: Diagnosis not present

## 2017-08-30 DIAGNOSIS — I10 Essential (primary) hypertension: Secondary | ICD-10-CM | POA: Diagnosis not present

## 2017-08-30 DIAGNOSIS — M6281 Muscle weakness (generalized): Secondary | ICD-10-CM | POA: Diagnosis not present

## 2017-08-30 DIAGNOSIS — R262 Difficulty in walking, not elsewhere classified: Secondary | ICD-10-CM | POA: Diagnosis not present

## 2017-09-24 DIAGNOSIS — R262 Difficulty in walking, not elsewhere classified: Secondary | ICD-10-CM | POA: Diagnosis not present

## 2017-09-24 DIAGNOSIS — M545 Low back pain: Secondary | ICD-10-CM | POA: Diagnosis not present

## 2017-09-24 DIAGNOSIS — I10 Essential (primary) hypertension: Secondary | ICD-10-CM | POA: Diagnosis not present

## 2017-09-24 DIAGNOSIS — M6281 Muscle weakness (generalized): Secondary | ICD-10-CM | POA: Diagnosis not present

## 2017-09-27 DIAGNOSIS — M6281 Muscle weakness (generalized): Secondary | ICD-10-CM | POA: Diagnosis not present

## 2017-09-27 DIAGNOSIS — I10 Essential (primary) hypertension: Secondary | ICD-10-CM | POA: Diagnosis not present

## 2017-09-27 DIAGNOSIS — R262 Difficulty in walking, not elsewhere classified: Secondary | ICD-10-CM | POA: Diagnosis not present

## 2017-09-27 DIAGNOSIS — M545 Low back pain: Secondary | ICD-10-CM | POA: Diagnosis not present

## 2017-10-01 DIAGNOSIS — I10 Essential (primary) hypertension: Secondary | ICD-10-CM | POA: Diagnosis not present

## 2017-10-01 DIAGNOSIS — M6281 Muscle weakness (generalized): Secondary | ICD-10-CM | POA: Diagnosis not present

## 2017-10-01 DIAGNOSIS — R262 Difficulty in walking, not elsewhere classified: Secondary | ICD-10-CM | POA: Diagnosis not present

## 2017-10-01 DIAGNOSIS — M545 Low back pain: Secondary | ICD-10-CM | POA: Diagnosis not present

## 2017-10-02 DIAGNOSIS — M545 Low back pain: Secondary | ICD-10-CM | POA: Diagnosis not present

## 2017-10-02 DIAGNOSIS — H5212 Myopia, left eye: Secondary | ICD-10-CM | POA: Diagnosis not present

## 2017-10-04 DIAGNOSIS — J040 Acute laryngitis: Secondary | ICD-10-CM | POA: Diagnosis not present

## 2017-10-04 DIAGNOSIS — J069 Acute upper respiratory infection, unspecified: Secondary | ICD-10-CM | POA: Diagnosis not present

## 2017-10-07 DIAGNOSIS — M2041 Other hammer toe(s) (acquired), right foot: Secondary | ICD-10-CM | POA: Diagnosis not present

## 2017-10-07 DIAGNOSIS — M21611 Bunion of right foot: Secondary | ICD-10-CM | POA: Diagnosis not present

## 2017-10-07 DIAGNOSIS — M2021 Hallux rigidus, right foot: Secondary | ICD-10-CM | POA: Diagnosis not present

## 2017-10-08 DIAGNOSIS — I10 Essential (primary) hypertension: Secondary | ICD-10-CM | POA: Diagnosis not present

## 2017-10-08 DIAGNOSIS — M545 Low back pain: Secondary | ICD-10-CM | POA: Diagnosis not present

## 2017-10-08 DIAGNOSIS — M6281 Muscle weakness (generalized): Secondary | ICD-10-CM | POA: Diagnosis not present

## 2017-10-08 DIAGNOSIS — R262 Difficulty in walking, not elsewhere classified: Secondary | ICD-10-CM | POA: Diagnosis not present

## 2017-10-09 DIAGNOSIS — I10 Essential (primary) hypertension: Secondary | ICD-10-CM | POA: Diagnosis not present

## 2017-10-09 DIAGNOSIS — J40 Bronchitis, not specified as acute or chronic: Secondary | ICD-10-CM | POA: Diagnosis not present

## 2017-10-09 DIAGNOSIS — E119 Type 2 diabetes mellitus without complications: Secondary | ICD-10-CM | POA: Diagnosis not present

## 2017-10-09 DIAGNOSIS — R5381 Other malaise: Secondary | ICD-10-CM | POA: Diagnosis not present

## 2017-10-17 DIAGNOSIS — M461 Sacroiliitis, not elsewhere classified: Secondary | ICD-10-CM | POA: Diagnosis not present

## 2017-10-24 DIAGNOSIS — E785 Hyperlipidemia, unspecified: Secondary | ICD-10-CM | POA: Diagnosis not present

## 2017-12-20 DIAGNOSIS — N35011 Post-traumatic bulbous urethral stricture: Secondary | ICD-10-CM | POA: Diagnosis not present

## 2017-12-20 DIAGNOSIS — C61 Malignant neoplasm of prostate: Secondary | ICD-10-CM | POA: Diagnosis not present

## 2017-12-20 DIAGNOSIS — N5201 Erectile dysfunction due to arterial insufficiency: Secondary | ICD-10-CM | POA: Diagnosis not present

## 2017-12-20 DIAGNOSIS — C670 Malignant neoplasm of trigone of bladder: Secondary | ICD-10-CM | POA: Diagnosis not present

## 2018-01-15 DIAGNOSIS — M2021 Hallux rigidus, right foot: Secondary | ICD-10-CM | POA: Diagnosis not present

## 2018-01-15 DIAGNOSIS — M2041 Other hammer toe(s) (acquired), right foot: Secondary | ICD-10-CM | POA: Diagnosis not present

## 2018-01-15 DIAGNOSIS — M79671 Pain in right foot: Secondary | ICD-10-CM | POA: Diagnosis not present

## 2018-01-15 DIAGNOSIS — M21611 Bunion of right foot: Secondary | ICD-10-CM | POA: Diagnosis not present

## 2018-01-15 DIAGNOSIS — M21619 Bunion of unspecified foot: Secondary | ICD-10-CM | POA: Diagnosis not present

## 2018-01-22 ENCOUNTER — Telehealth: Payer: Self-pay | Admitting: Cardiology

## 2018-01-22 NOTE — Telephone Encounter (Signed)
Received incoming records from [facility] for upcoming appointment on 02/05/18 @ 2:20pm with Dr. Percival Spanish. Records located in Medical Records. 01/22/18 ab

## 2018-02-03 NOTE — Progress Notes (Signed)
Cardiology Office Note   Date:  02/05/2018   ID:  Anthony Hartman, DOB 1940/11/16, MRN [id number]  PCP:  Raina Mina., MD  Cardiologist:   No primary care provider on file. Referring:  Raina Mina., MD  Chief Complaint  Patient presents with  . Coronary Artery Disease      History of Present Illness: Anthony Hartman is a 77 y.o. male who is referred by Raina Mina., MD for evaluation of CAD.  Patient has a history of coronary disease and I have notes.  In 2012 he had an occluded OM.  He managed medically as I do not have these records.  He did have an inferior MI in November 2017 and had a bare-metal stent to his right coronary artery.  This was all in [facility].   His last catheterization was late last year and he had an occluded ramus that they could not cross.  He was managed medically.  He is having to switch his care here.  Requiring foot surgery soon.  Chronic foot pain and chronic back pain.  He denies any ongoing chest pressure, neck or arm discomfort.  He had no new palpitations, presyncope or syncope.  He has had no SOB, PND or orthopnea.  Past Medical History:  Diagnosis Date  . Anxiety   . Arthritis   . Bladder cancer ([place])   . Bladder stone   . BPH (benign prostatic hypertrophy)   . Depression   . GERD (gastroesophageal reflux disease)   . History of bladder cancer    CARCINOMA IN SITU OF BLADDER  . History of kidney stones   . History of myocardial infarction    07-17-2011--   MI W/ OCCLUDED OM1. 2017 occluded RCA treated with a bare-metal stent, high-grade ramus intermediate stenosis in 2018 with failed attempted PCI  . Hyperlipidemia   . Hypertension   . Myocardial infarction [facility]) 2012   cardiac studies on chart  . OSA (obstructive sleep apnea)    cpap non-compliant   . Prostate cancer [facility])     Past Surgical History:  Procedure Laterality Date  . BACK SURGERY    . CARDIAC CATHETERIZATION  07-17-2011  DR ANDY CHIU ([facility])   OCCLUDED OM1 WITH UNSUCCESSFUL PCI ATTEMPT/ MILD TO MODERATE DISEASE ELSEWHERE  . CARDIOVASCULAR STRESS TEST  04/22/11  . CORONARY ANGIOPLASTY     2 stents  . CYSTO/ BLADDER BX'S  X2  2001  / X1  2003  . CYSTO/ BLADDER BX'S/ BILATERAL RETROGRADE URETEROPYLEGRAM  2003; 2007; 2008;  12-30-2007   CARCINOMA IN SITU OF BLADDER  . CYSTOSCOPY WITH LITHOLAPAXY N/A 05/07/2013   Procedure: CYSTOSCOPY WITH LITHOLAPAXY;  Surgeon: Franchot Gallo, MD;  Location: [facility];  Service: Urology;  Laterality: N/A;  . EHL Lengthening Right 05/10/2017   RT FOOT  . Hammer Toe Repair Right 05/10/2017   Rt #2 Toe  . LUMBAR DECOMPRESSION FORAMINOTOMY L3 -- L5/  REMOVAL CYST L4-5 FACET JOINT  08-21-2009   ALSO HAD PRIOR BACK SURG IN 1992  . LUMBAR LAMINECTOMY  04/04/2017   L2-3  . LUMBAR LAMINECTOMY/DECOMPRESSION MICRODISCECTOMY N/A 04/04/2017   Procedure: Laminectomy and Foraminotomy - Lumbar Two-Three;  Surgeon: Eustace Moore, MD;  Location: [facility];  Service: Neurosurgery;  Laterality: N/A;  . OPEN TENOTOMY OF FLEXOR 2ND AND 3RD RIGHT TOES  APRIL 2008  . PROSTATE BIOPSY    . TRANSTHORACIC ECHOCARDIOGRAM  07-17-2011  [facility]  MILD TO MODERATE CONCENTRIC LVH/ NORMAL LVSF/ EF 55-60%/ MILD AORTIC STENOSIS  . TRANSURETHRAL RESECTION OF BLADDER TUMOR  01-10-2004  . TRANSURETHRAL RESECTION OF PROSTATE N/A 05/07/2013   Procedure: TRANSURETHRAL RESECTION OF THE PROSTATE WITH GYRUS INSTRUMENTS;  Surgeon: Franchot Gallo, MD;  Location: [facility];  Service: Urology;  Laterality: N/A;     Current Outpatient Medications  Medication Sig Dispense Refill  . ALPRAZolam (XANAX) 0.5 MG tablet Take 0.25 mg by mouth at bedtime as needed for anxiety.    Marland Kitchen amLODipine (NORVASC) 2.5 MG tablet Take 2.5 mg by mouth daily.    Marland Kitchen buPROPion (WELLBUTRIN XL) 300 MG 24 hr tablet Take 300 mg by mouth daily.    . carvedilol (COREG) 6.25 MG tablet Take 6.25 mg by mouth 2 (two) times daily with a meal.    . clopidogrel  (PLAVIX) 75 MG tablet Take 75 mg by mouth daily.    . Coenzyme Q10 (COQ-10 PO) Take 1 tablet by mouth daily.    Marland Kitchen ezetimibe (ZETIA) 10 MG tablet Take 10 mg by mouth at bedtime.    . meloxicam (MOBIC) 15 MG tablet Take 15 mg by mouth daily.     . methocarbamol (ROBAXIN) 500 MG tablet Take 1 tablet (500 mg total) by mouth every 6 (six) hours as needed for muscle spasms. 60 tablet 1  . Multiple Vitamin (MULTIVITAMIN WITH MINERALS) TABS tablet Take 1 tablet by mouth daily.    . nitroGLYCERIN (NITROSTAT) 0.4 MG SL tablet Place 0.4 mg under the tongue every 5 (five) minutes as needed for chest pain.     . ranitidine (ZANTAC) 150 MG tablet take one tablet twice daily    . valACYclovir (VALTREX) 1000 MG tablet Take 1,000 mg by mouth 3 (three) times daily as needed (fever blisters).     . rosuvastatin (CRESTOR) 20 MG tablet Take 1 tablet (20 mg total) by mouth daily. 90 tablet 3   No current facility-administered medications for this visit.     Allergies:   Statins; Augmentin [amoxicillin-pot clavulanate]; Dexamethasone; and Sulfa antibiotics    Social History:  The patient  reports that he quit smoking about 7 years ago. His smoking use included cigarettes. He has a 25.00 pack-year smoking history. His smokeless tobacco use includes snuff. He reports that he drinks about 1.2 oz of alcohol per week. He reports that he does not use drugs.   Family History:  The patient's family history includes Cancer in his father and mother.    ROS:  Please see the history of present illness.   Otherwise, review of systems are positive for none.   All other systems are reviewed and negative.    PHYSICAL EXAM: VS:  BP 128/78   Pulse 66   Ht 6\' 4"  (1.93 m)   Wt 236 lb (107 kg)   BMI 28.73 kg/m  , BMI Body mass index is 28.73 kg/m. GENERAL:  Well appearing HEENT:  Pupils equal round and reactive, fundi not visualized, oral mucosa unremarkable NECK:  No jugular venous distention, waveform within normal limits,  carotid upstroke brisk and symmetric, bilateral bruits, no thyromegaly LYMPHATICS:  No cervical, inguinal adenopathy LUNGS:  Clear to auscultation bilaterally BACK:  No CVA tenderness CHEST:  Unremarkable HEART:  PMI not displaced or sustained,S1 and S2 within normal limits, no S3, no S4, no clicks, no rubs, 2 out of 6 early peaking systolic murmur radiating slightly out the aortic outflow tract no diastolic murmurs ABD:  Flat, positive bowel sounds normal in  frequency in pitch, no bruits, no rebound, no guarding, no midline pulsatile mass, no hepatomegaly, no splenomegaly EXT:  2 plus pulses throughout, no edema, no cyanosis no clubbing SKIN:  No rashes no nodules NEURO:  Cranial nerves II through XII grossly intact, motor grossly intact throughout PSYCH:  Cognitively intact, oriented to person place and time    EKG:  EKG is ordered today. The ekg ordered today demonstrates sinus rhythm, rate of 66, axis within normal limits, intervals within normal limits   Recent Labs: 03/27/2017: BUN 22; Creatinine, Ser 0.99; Hemoglobin 13.2; Platelets 276; Potassium 4.2; Sodium 136    Lipid Panel No results found for: CHOL, TRIG, HDL, CHOLHDL, VLDL, LDLCALC, LDLDIRECT    Wt Readings from Last 3 Encounters:  02/05/18 236 lb (107 kg)  05/07/17 235 lb (106.6 kg)  04/15/17 230 lb (104.3 kg)      Other studies Reviewed: Additional studies/ records that were reviewed today include: Care Everywhere records. Review of the above records demonstrates:  Please see elsewhere in the note.     ASSESSMENT AND PLAN:  CAD: The patient has coronary disease as described above.  No active symptoms.  He will continue with aggressive risk reduction.  DYSLIPIDEMIA: He has had diffuse muscle aches and he thought this was related to statin but he is willing to try Crestor as he could not afford PCSK9 inhibitor. I will check lipid and liver in 8 weeks.    BRUIT:  I will check Dopplers  AS:  This was mild.  I  will follow this clinically.   Current medicines are reviewed at length with the patient today.  The patient does not have concerns regarding medicines.  The following changes have been made:  As above  Labs/ tests ordered today include:   Orders Placed This Encounter  Procedures  . Lipid panel  . EKG 12-Lead     Disposition:   FU with me in six months.     Signed, Minus Breeding, MD  02/05/2018 5:35 PM    [facility]

## 2018-02-05 ENCOUNTER — Encounter: Payer: Self-pay | Admitting: Cardiology

## 2018-02-05 ENCOUNTER — Other Ambulatory Visit: Payer: Self-pay | Admitting: Orthopedic Surgery

## 2018-02-05 ENCOUNTER — Ambulatory Visit: Payer: PPO | Admitting: Cardiology

## 2018-02-05 VITALS — BP 128/78 | HR 66 | Ht 76.0 in | Wt 236.0 lb

## 2018-02-05 DIAGNOSIS — I2510 Atherosclerotic heart disease of native coronary artery without angina pectoris: Secondary | ICD-10-CM | POA: Diagnosis not present

## 2018-02-05 DIAGNOSIS — I10 Essential (primary) hypertension: Secondary | ICD-10-CM

## 2018-02-05 DIAGNOSIS — R0989 Other specified symptoms and signs involving the circulatory and respiratory systems: Secondary | ICD-10-CM

## 2018-02-05 DIAGNOSIS — E785 Hyperlipidemia, unspecified: Secondary | ICD-10-CM | POA: Diagnosis not present

## 2018-02-05 MED ORDER — ROSUVASTATIN CALCIUM 20 MG PO TABS: 20.0000 mg | ORAL_TABLET | Freq: Every day | ORAL | 3 refills | Status: DC

## 2018-02-05 NOTE — Patient Instructions (Signed)
Medication Instructions:  START- Crestor 20 mg daily  If you need a refill on your cardiac medications before your next appointment, please call your pharmacy.  Labwork: Fasting Lipids in 8 weeks HERE IN OUR OFFICE AT [facility]  Take the provided lab slips for you to take with you to the lab for you blood draw.   You will need to fast. DO NOT EAT OR DRINK PAST MIDNIGHT.   Testing/Procedures: Your physician has requested that you have a carotid duplex. This test is an ultrasound of the carotid arteries in your neck. It looks at blood flow through these arteries that supply the brain with blood. Allow one hour for this exam. There are no restrictions or special instructions.  Follow-Up: Your physician wants you to follow-up in: 6 Months. You should receive a reminder letter in the mail two months in advance. If you do not receive a letter, please call our office [phone].    Thank you for choosing [facility] at [facility]!!

## 2018-02-06 DIAGNOSIS — R300 Dysuria: Secondary | ICD-10-CM | POA: Diagnosis not present

## 2018-02-06 DIAGNOSIS — J309 Allergic rhinitis, unspecified: Secondary | ICD-10-CM | POA: Diagnosis not present

## 2018-02-06 DIAGNOSIS — N3001 Acute cystitis with hematuria: Secondary | ICD-10-CM | POA: Diagnosis not present

## 2018-02-07 ENCOUNTER — Telehealth: Payer: Self-pay | Admitting: Cardiology

## 2018-02-07 NOTE — Telephone Encounter (Signed)
New Message:    Pt states he should have his repatha by Thursday and pt wants to know should still take his current medications before Thursday. Pt would like Dr. opinion

## 2018-02-07 NOTE — Telephone Encounter (Signed)
Repatha initiated by [facility]. He should received further instructions from them.   Our practice recommend to continue all medication until f/u lipid profile but [facility] may have additional information for him.

## 2018-02-07 NOTE — Telephone Encounter (Signed)
Returned call to patient, he states Dr. Percival Spanish has wanted him on Repatha but started him on Crestor (currently taking Zetia as well) at [place].   States he is starting Repatha (through [facility]) next week and wanted to know if he should continue the Crestor and Zetia when he starts the [place].  Advised I would route to MD for review

## 2018-02-08 NOTE — Telephone Encounter (Signed)
He can stop the Zetia.   Continue the Crestor.  Who is prescribing the [place]?

## 2018-02-10 ENCOUNTER — Other Ambulatory Visit: Payer: Self-pay | Admitting: Cardiology

## 2018-02-10 DIAGNOSIS — I6523 Occlusion and stenosis of bilateral carotid arteries: Secondary | ICD-10-CM

## 2018-02-10 DIAGNOSIS — R0989 Other specified symptoms and signs involving the circulatory and respiratory systems: Secondary | ICD-10-CM

## 2018-02-10 NOTE — Telephone Encounter (Signed)
Patient aware.   Repatha is being initiated by Dr. Wyline Copas at [facility].  1st dose 4/3

## 2018-02-11 ENCOUNTER — Ambulatory Visit (HOSPITAL_COMMUNITY)
Admission: RE | Admit: 2018-02-11 | Discharge: 2018-02-11 | Disposition: A | Discharge status: Home / Self Care | Payer: PPO | Source: Ambulatory Visit | Attending: Cardiology | Admitting: Cardiology

## 2018-02-11 DIAGNOSIS — I10 Essential (primary) hypertension: Secondary | ICD-10-CM | POA: Insufficient documentation

## 2018-02-11 DIAGNOSIS — C670 Malignant neoplasm of trigone of bladder: Secondary | ICD-10-CM | POA: Diagnosis not present

## 2018-02-11 DIAGNOSIS — R300 Dysuria: Secondary | ICD-10-CM | POA: Diagnosis not present

## 2018-02-11 DIAGNOSIS — I6523 Occlusion and stenosis of bilateral carotid arteries: Secondary | ICD-10-CM | POA: Insufficient documentation

## 2018-02-11 DIAGNOSIS — N410 Acute prostatitis: Secondary | ICD-10-CM | POA: Diagnosis not present

## 2018-02-11 DIAGNOSIS — R0989 Other specified symptoms and signs involving the circulatory and respiratory systems: Secondary | ICD-10-CM | POA: Diagnosis not present

## 2018-02-11 DIAGNOSIS — Z87891 Personal history of nicotine dependence: Secondary | ICD-10-CM | POA: Diagnosis not present

## 2018-02-11 DIAGNOSIS — N35011 Post-traumatic bulbous urethral stricture: Secondary | ICD-10-CM | POA: Diagnosis not present

## 2018-02-11 DIAGNOSIS — C61 Malignant neoplasm of prostate: Secondary | ICD-10-CM | POA: Diagnosis not present

## 2018-02-14 ENCOUNTER — Encounter (HOSPITAL_BASED_OUTPATIENT_CLINIC_OR_DEPARTMENT_OTHER): Payer: Self-pay | Admitting: *Deleted

## 2018-02-14 ENCOUNTER — Other Ambulatory Visit: Payer: Self-pay

## 2018-02-14 NOTE — Progress Notes (Signed)
   02/14/18 1058  OBSTRUCTIVE SLEEP APNEA  Have you ever been diagnosed with sleep apnea through a sleep study? Yes  If yes, do you have and use a CPAP or BPAP machine every night? 0 (has CPAP but stopped using, encouraged to restart)  Do you snore loudly (loud enough to be heard through closed doors)?  1  Do you often feel tired, fatigued, or sleepy during the daytime (such as falling asleep during driving or talking to someone)? 0  Has anyone observed you stop breathing during your sleep? 0  Do you have, or are you being treated for high blood pressure? 1  BMI more than 35 kg/m2? 0  Age > 50 (1-yes) 1  Neck circumference greater than:Male 16 inches or larger, Male 17inches or larger? 0  Male Gender (Yes=1) 1  Obstructive Sleep Apnea Score 4

## 2018-02-20 ENCOUNTER — Encounter (HOSPITAL_BASED_OUTPATIENT_CLINIC_OR_DEPARTMENT_OTHER): Payer: Self-pay | Admitting: *Deleted

## 2018-02-20 ENCOUNTER — Ambulatory Visit (HOSPITAL_BASED_OUTPATIENT_CLINIC_OR_DEPARTMENT_OTHER): Payer: PPO | Admitting: Anesthesiology

## 2018-02-20 ENCOUNTER — Other Ambulatory Visit: Payer: Self-pay

## 2018-02-20 ENCOUNTER — Ambulatory Visit (HOSPITAL_BASED_OUTPATIENT_CLINIC_OR_DEPARTMENT_OTHER)
Admission: RE | Admit: 2018-02-20 | Discharge: 2018-02-20 | Disposition: A | Discharge status: Home / Self Care | Payer: PPO | Source: Ambulatory Visit | Attending: Orthopedic Surgery | Admitting: Orthopedic Surgery

## 2018-02-20 ENCOUNTER — Encounter (HOSPITAL_BASED_OUTPATIENT_CLINIC_OR_DEPARTMENT_OTHER)
Admission: RE | Disposition: A | Discharge status: Home / Self Care | Payer: Self-pay | Source: Ambulatory Visit | Attending: Orthopedic Surgery

## 2018-02-20 DIAGNOSIS — E785 Hyperlipidemia, unspecified: Secondary | ICD-10-CM | POA: Insufficient documentation

## 2018-02-20 DIAGNOSIS — Z7902 Long term (current) use of antithrombotics/antiplatelets: Secondary | ICD-10-CM | POA: Insufficient documentation

## 2018-02-20 DIAGNOSIS — F419 Anxiety disorder, unspecified: Secondary | ICD-10-CM | POA: Diagnosis not present

## 2018-02-20 DIAGNOSIS — G4733 Obstructive sleep apnea (adult) (pediatric): Secondary | ICD-10-CM | POA: Diagnosis not present

## 2018-02-20 DIAGNOSIS — M2011 Hallux valgus (acquired), right foot: Secondary | ICD-10-CM | POA: Diagnosis not present

## 2018-02-20 DIAGNOSIS — M2021 Hallux rigidus, right foot: Secondary | ICD-10-CM | POA: Insufficient documentation

## 2018-02-20 DIAGNOSIS — K219 Gastro-esophageal reflux disease without esophagitis: Secondary | ICD-10-CM | POA: Insufficient documentation

## 2018-02-20 DIAGNOSIS — N400 Enlarged prostate without lower urinary tract symptoms: Secondary | ICD-10-CM | POA: Insufficient documentation

## 2018-02-20 DIAGNOSIS — Z9119 Patient's noncompliance with other medical treatment and regimen: Secondary | ICD-10-CM | POA: Diagnosis not present

## 2018-02-20 DIAGNOSIS — Z79899 Other long term (current) drug therapy: Secondary | ICD-10-CM | POA: Insufficient documentation

## 2018-02-20 DIAGNOSIS — M2041 Other hammer toe(s) (acquired), right foot: Secondary | ICD-10-CM | POA: Diagnosis not present

## 2018-02-20 DIAGNOSIS — Z7982 Long term (current) use of aspirin: Secondary | ICD-10-CM | POA: Diagnosis not present

## 2018-02-20 DIAGNOSIS — I252 Old myocardial infarction: Secondary | ICD-10-CM | POA: Diagnosis not present

## 2018-02-20 DIAGNOSIS — I2510 Atherosclerotic heart disease of native coronary artery without angina pectoris: Secondary | ICD-10-CM | POA: Diagnosis not present

## 2018-02-20 DIAGNOSIS — M7741 Metatarsalgia, right foot: Secondary | ICD-10-CM | POA: Insufficient documentation

## 2018-02-20 DIAGNOSIS — Z87891 Personal history of nicotine dependence: Secondary | ICD-10-CM | POA: Diagnosis not present

## 2018-02-20 DIAGNOSIS — M1990 Unspecified osteoarthritis, unspecified site: Secondary | ICD-10-CM | POA: Diagnosis not present

## 2018-02-20 DIAGNOSIS — F329 Major depressive disorder, single episode, unspecified: Secondary | ICD-10-CM | POA: Diagnosis not present

## 2018-02-20 DIAGNOSIS — I10 Essential (primary) hypertension: Secondary | ICD-10-CM | POA: Insufficient documentation

## 2018-02-20 DIAGNOSIS — Z955 Presence of coronary angioplasty implant and graft: Secondary | ICD-10-CM | POA: Insufficient documentation

## 2018-02-20 DIAGNOSIS — Z86008 Personal history of in-situ neoplasm of other site: Secondary | ICD-10-CM | POA: Diagnosis not present

## 2018-02-20 DIAGNOSIS — M216X1 Other acquired deformities of right foot: Secondary | ICD-10-CM | POA: Diagnosis not present

## 2018-02-20 DIAGNOSIS — Z8546 Personal history of malignant neoplasm of prostate: Secondary | ICD-10-CM | POA: Diagnosis not present

## 2018-02-20 DIAGNOSIS — M21611 Bunion of right foot: Secondary | ICD-10-CM | POA: Diagnosis not present

## 2018-02-20 HISTORY — PX: ARTHRODESIS METATARSALPHALANGEAL JOINT (MTPJ): SHX6566

## 2018-02-20 HISTORY — PX: HAMMERTOE RECONSTRUCTION WITH WEIL OSTEOTOMY: SHX5631

## 2018-02-20 SURGERY — FUSION, JOINT, GREAT TOE
Anesthesia: General | Site: Foot | Laterality: Right

## 2018-02-20 MED ORDER — ONDANSETRON HCL 4 MG/2ML IJ SOLN
INTRAMUSCULAR | Status: AC
  Filled 2018-02-20: qty 12

## 2018-02-20 MED ORDER — OXYCODONE HCL 5 MG PO TABS: 5.0000 mg | ORAL_TABLET | ORAL | 0 refills | Status: DC | PRN

## 2018-02-20 MED ORDER — VANCOMYCIN HCL IN DEXTROSE 1-5 GM/200ML-% IV SOLN
INTRAVENOUS | Status: AC
  Filled 2018-02-20: qty 200

## 2018-02-20 MED ORDER — LIDOCAINE HCL (CARDIAC) 20 MG/ML IV SOLN
INTRAVENOUS | Status: AC
  Filled 2018-02-20: qty 15

## 2018-02-20 MED ORDER — ONDANSETRON HCL 4 MG/2ML IJ SOLN
INTRAMUSCULAR | Status: DC | PRN
  Administered 2018-02-20: 4 mg via INTRAVENOUS

## 2018-02-20 MED ORDER — MEPERIDINE HCL 25 MG/ML IJ SOLN: 6.2500 mg | INTRAMUSCULAR | Status: DC | PRN

## 2018-02-20 MED ORDER — MIDAZOLAM HCL 2 MG/2ML IJ SOLN
INTRAMUSCULAR | Status: AC
  Filled 2018-02-20: qty 2

## 2018-02-20 MED ORDER — SCOPOLAMINE 1 MG/3DAYS TD PT72: 1.0000 | MEDICATED_PATCH | Freq: Once | TRANSDERMAL | Status: DC | PRN

## 2018-02-20 MED ORDER — OXYCODONE HCL 5 MG PO TABS: 5.0000 mg | ORAL_TABLET | Freq: Once | ORAL | Status: DC | PRN

## 2018-02-20 MED ORDER — MIDAZOLAM HCL 2 MG/2ML IJ SOLN: 1.0000 mg | INTRAMUSCULAR | Status: DC | PRN

## 2018-02-20 MED ORDER — EPHEDRINE 5 MG/ML INJ
INTRAVENOUS | Status: AC
  Filled 2018-02-20: qty 10

## 2018-02-20 MED ORDER — LACTATED RINGERS IV SOLN
INTRAVENOUS | Status: DC
  Administered 2018-02-20: 11:00:00 via INTRAVENOUS

## 2018-02-20 MED ORDER — PROMETHAZINE HCL 25 MG/ML IJ SOLN: 6.2500 mg | INTRAMUSCULAR | Status: DC | PRN

## 2018-02-20 MED ORDER — OXYCODONE HCL 5 MG/5ML PO SOLN: 5.0000 mg | Freq: Once | ORAL | Status: DC | PRN

## 2018-02-20 MED ORDER — VANCOMYCIN HCL 1000 MG IV SOLR
INTRAVENOUS | Status: DC | PRN
  Administered 2018-02-20: 1000 mg via INTRAVENOUS

## 2018-02-20 MED ORDER — ASPIRIN EC 81 MG PO TBEC: 81.0000 mg | DELAYED_RELEASE_TABLET | Freq: Two times a day (BID) | ORAL | Status: DC

## 2018-02-20 MED ORDER — CEFAZOLIN SODIUM-DEXTROSE 2-4 GM/100ML-% IV SOLN
INTRAVENOUS | Status: AC
  Filled 2018-02-20: qty 100

## 2018-02-20 MED ORDER — LIDOCAINE HCL (CARDIAC) 20 MG/ML IV SOLN
INTRAVENOUS | Status: DC | PRN
  Administered 2018-02-20: 30 mg via INTRAVENOUS

## 2018-02-20 MED ORDER — FENTANYL CITRATE (PF) 100 MCG/2ML IJ SOLN
50.0000 ug | INTRAMUSCULAR | Status: DC | PRN
  Administered 2018-02-20: 100 ug via INTRAVENOUS

## 2018-02-20 MED ORDER — PROPOFOL 500 MG/50ML IV EMUL
INTRAVENOUS | Status: DC | PRN
  Administered 2018-02-20: 75 ug/kg/min via INTRAVENOUS

## 2018-02-20 MED ORDER — FENTANYL CITRATE (PF) 100 MCG/2ML IJ SOLN
INTRAMUSCULAR | Status: AC
  Filled 2018-02-20: qty 2

## 2018-02-20 MED ORDER — DEXAMETHASONE SODIUM PHOSPHATE 10 MG/ML IJ SOLN
INTRAMUSCULAR | Status: AC
  Filled 2018-02-20: qty 3

## 2018-02-20 MED ORDER — HYDROMORPHONE HCL 1 MG/ML IJ SOLN: 0.2500 mg | INTRAMUSCULAR | Status: DC | PRN

## 2018-02-20 MED ORDER — 0.9 % SODIUM CHLORIDE (POUR BTL) OPTIME
TOPICAL | Status: DC | PRN
  Administered 2018-02-20: 120 mL

## 2018-02-20 MED ORDER — PROPOFOL 500 MG/50ML IV EMUL
INTRAVENOUS | Status: AC
  Filled 2018-02-20: qty 150

## 2018-02-20 MED ORDER — CHLORHEXIDINE GLUCONATE 4 % EX LIQD: 60.0000 mL | Freq: Once | CUTANEOUS | Status: DC

## 2018-02-20 MED ORDER — CEFAZOLIN SODIUM-DEXTROSE 2-4 GM/100ML-% IV SOLN: 2.0000 g | INTRAVENOUS | Status: DC

## 2018-02-20 MED ORDER — SODIUM CHLORIDE 0.9 % IV SOLN: INTRAVENOUS | Status: DC

## 2018-02-20 SURGICAL SUPPLY — 94 items
BANDAGE ESMARK 6X9 LF (GAUZE/BANDAGES/DRESSINGS) IMPLANT
BIT DRILL 2 FAST STEP (BIT) ×2 IMPLANT
BIT DRILL 2.9 CANN QC NONSTRL (BIT) ×2 IMPLANT
BIT DRILL 5/64X5 DISP (BIT) ×2 IMPLANT
BIT DRILL CANN 2.4 (BIT) ×2
BIT DRILL CANN MAX VPC 2.4 (BIT) ×1 IMPLANT
BLADE AVERAGE 25X9 (BLADE) IMPLANT
BLADE LONG MED 25X9 (BLADE) ×2 IMPLANT
BLADE MICRO SAGITTAL (BLADE) IMPLANT
BLADE OSC/SAG .038X5.5 CUT EDG (BLADE) ×2 IMPLANT
BLADE SURG 15 STRL LF DISP TIS (BLADE) ×2 IMPLANT
BLADE SURG 15 STRL SS (BLADE) ×2
BNDG COHESIVE 4X5 TAN STRL (GAUZE/BANDAGES/DRESSINGS) ×2 IMPLANT
BNDG COHESIVE 6X5 TAN STRL LF (GAUZE/BANDAGES/DRESSINGS) ×2 IMPLANT
BNDG CONFORM 2 STRL LF (GAUZE/BANDAGES/DRESSINGS) IMPLANT
BNDG CONFORM 3 STRL LF (GAUZE/BANDAGES/DRESSINGS) ×2 IMPLANT
BNDG ESMARK 4X9 LF (GAUZE/BANDAGES/DRESSINGS) IMPLANT
BNDG ESMARK 6X9 LF (GAUZE/BANDAGES/DRESSINGS)
CAP PIN PROTECTOR ORTHO WHT (CAP) IMPLANT
CHLORAPREP W/TINT 26ML (MISCELLANEOUS) ×2 IMPLANT
COVER BACK TABLE 60X90IN (DRAPES) ×2 IMPLANT
CUFF TOURNIQUET SINGLE 24IN (TOURNIQUET CUFF) ×2 IMPLANT
CUFF TOURNIQUET SINGLE 34IN LL (TOURNIQUET CUFF) IMPLANT
CUFF TOURNIQUET SINGLE 44IN (TOURNIQUET CUFF) IMPLANT
DRAPE EXTREMITY T 121X128X90 (DRAPE) ×2 IMPLANT
DRAPE OEC MINIVIEW 54X84 (DRAPES) ×2 IMPLANT
DRAPE U-SHAPE 47X51 STRL (DRAPES) ×2 IMPLANT
DRSG MEPITEL 4X7.2 (GAUZE/BANDAGES/DRESSINGS) ×2 IMPLANT
DRSG PAD ABDOMINAL 8X10 ST (GAUZE/BANDAGES/DRESSINGS) ×4 IMPLANT
ELECT REM PT RETURN 9FT ADLT (ELECTROSURGICAL) ×2
ELECTRODE REM PT RTRN 9FT ADLT (ELECTROSURGICAL) ×1 IMPLANT
GAUZE SPONGE 4X4 12PLY STRL (GAUZE/BANDAGES/DRESSINGS) ×2 IMPLANT
GLOVE BIO SURGEON STRL SZ8 (GLOVE) ×2 IMPLANT
GLOVE BIOGEL PI IND STRL 7.0 (GLOVE) ×3 IMPLANT
GLOVE BIOGEL PI IND STRL 8 (GLOVE) ×2 IMPLANT
GLOVE BIOGEL PI INDICATOR 7.0 (GLOVE) ×3
GLOVE BIOGEL PI INDICATOR 8 (GLOVE) ×2
GLOVE ECLIPSE 6.5 STRL STRAW (GLOVE) ×2 IMPLANT
GLOVE ECLIPSE 8.0 STRL XLNG CF (GLOVE) ×2 IMPLANT
GLOVE SURG SS PI 6.5 STRL IVOR (GLOVE) ×2 IMPLANT
GOWN STRL REUS W/ TWL LRG LVL3 (GOWN DISPOSABLE) ×2 IMPLANT
GOWN STRL REUS W/ TWL XL LVL3 (GOWN DISPOSABLE) ×2 IMPLANT
GOWN STRL REUS W/TWL LRG LVL3 (GOWN DISPOSABLE) ×2
GOWN STRL REUS W/TWL XL LVL3 (GOWN DISPOSABLE) ×2
K-WIRE .054X4 (WIRE) IMPLANT
K-WIRE ACE 1.6X6 (WIRE) ×6
K-WIRE COCR 1.1X105 (WIRE) ×2
KWIRE ACE 1.6X6 (WIRE) ×3 IMPLANT
KWIRE COCR 1.1X105 (WIRE) ×1 IMPLANT
NEEDLE HYPO 22GX1.5 SAFETY (NEEDLE) IMPLANT
NS IRRIG 1000ML POUR BTL (IV SOLUTION) ×2 IMPLANT
PACK BASIN DAY SURGERY FS (CUSTOM PROCEDURE TRAY) ×2 IMPLANT
PAD CAST 4YDX4 CTTN HI CHSV (CAST SUPPLIES) ×1 IMPLANT
PADDING CAST ABS 4INX4YD NS (CAST SUPPLIES)
PADDING CAST ABS COTTON 4X4 ST (CAST SUPPLIES) IMPLANT
PADDING CAST COTTON 4X4 STRL (CAST SUPPLIES) ×1
PADDING CAST COTTON 6X4 STRL (CAST SUPPLIES) ×2 IMPLANT
PASSER SUT SWANSON 36MM LOOP (INSTRUMENTS) IMPLANT
PENCIL BUTTON HOLSTER BLD 10FT (ELECTRODE) ×2 IMPLANT
PLATE LOCK 1ST RT MTP (Plate) ×2 IMPLANT
SANITIZER HAND PURELL 535ML FO (MISCELLANEOUS) ×2 IMPLANT
SCREW CANN MAX VPC 3.4X20 (Screw) ×1 IMPLANT
SCREW HCS TWIST-OFF 2.0X14MM (Screw) ×2 IMPLANT
SCREW LAG  RD HEAD 4.0 38 LTH (Screw) ×1 IMPLANT
SCREW LAG RD HEAD 4.0 38 LTH (Screw) ×1 IMPLANT
SCREW PEG 2.5X18 NONLOCK (Screw) ×2 IMPLANT
SCREW PEG 2.5X20 NONLOCK (Screw) ×4 IMPLANT
SCREW PEG 2.5X22 NONLOCK (Screw) ×4 IMPLANT
SCREW PEG 2.5X24 NONLOCK (Screw) ×2 IMPLANT
SCREW PEG LOCK 2.5X10 (Peg) ×2 IMPLANT
SCREW PEG LOCK 2.5X12 (Screw) ×2 IMPLANT
SCREW VPS 3.4X20MM (Screw) ×1 IMPLANT
SHEET MEDIUM DRAPE 40X70 STRL (DRAPES) ×4 IMPLANT
SLEEVE SCD COMPRESS KNEE MED (MISCELLANEOUS) ×2 IMPLANT
SPLINT FAST PLASTER 5X30 (CAST SUPPLIES) ×20
SPLINT PLASTER CAST FAST 5X30 (CAST SUPPLIES) ×20 IMPLANT
SPONGE LAP 18X18 RF (DISPOSABLE) ×2 IMPLANT
SPONGE SURGIFOAM ABS GEL 12-7 (HEMOSTASIS) IMPLANT
STOCKINETTE 6  STRL (DRAPES) ×1
STOCKINETTE 6 STRL (DRAPES) ×1 IMPLANT
SUCTION FRAZIER HANDLE 10FR (MISCELLANEOUS) ×1
SUCTION TUBE FRAZIER 10FR DISP (MISCELLANEOUS) ×1 IMPLANT
SUT ETHILON 3 0 PS 1 (SUTURE) ×4 IMPLANT
SUT MNCRL AB 3-0 PS2 18 (SUTURE) ×2 IMPLANT
SUT VIC AB 0 SH 27 (SUTURE) IMPLANT
SUT VIC AB 2-0 SH 27 (SUTURE) ×1
SUT VIC AB 2-0 SH 27XBRD (SUTURE) ×1 IMPLANT
SUT VICRYL 0 UR6 27IN ABS (SUTURE) IMPLANT
SYR BULB 3OZ (MISCELLANEOUS) ×2 IMPLANT
SYR CONTROL 10ML LL (SYRINGE) IMPLANT
TOWEL OR 17X24 6PK STRL BLUE (TOWEL DISPOSABLE) ×6 IMPLANT
TUBE CONNECTING 20X1/4 (TUBING) ×2 IMPLANT
UNDERPAD 30X30 (UNDERPADS AND DIAPERS) ×2 IMPLANT
YANKAUER SUCT BULB TIP NO VENT (SUCTIONS) IMPLANT

## 2018-02-20 NOTE — Discharge Instructions (Addendum)
Post Anesthesia Home Care Instructions  Activity: Get plenty of rest for the remainder of the day. A responsible individual must stay with you for 24 hours following the procedure.  For the next 24 hours, DO NOT: -Drive a car -Paediatric nurse -Drink alcoholic beverages -Take any medication unless instructed by your physician -Make any legal decisions or sign important papers.  Meals: Start with liquid foods such as gelatin or soup. Progress to regular foods as tolerated. Avoid greasy, spicy, heavy foods. If nausea and/or vomiting occur, drink only clear liquids until the nausea and/or vomiting subsides. Call your physician if vomiting continues.  Special Instructions/Symptoms: Your throat may feel dry or sore from the anesthesia or the breathing tube placed in your throat during surgery. If this causes discomfort, gargle with warm salt water. The discomfort should disappear within 24 hours.  If you had a scopolamine patch placed behind your ear for the management of post- operative nausea and/or vomiting:  1. The medication in the patch is effective for 72 hours, after which it should be removed.  Wrap patch in a tissue and discard in the trash. Wash hands thoroughly with soap and water. 2. You may remove the patch earlier than 72 hours if you experience unpleasant side effects which may include dry mouth, dizziness or visual disturbances. 3. Avoid touching the patch. Wash your hands with soap and water after contact with the patch.    Post Anesthesia Home Care Instructions  Activity: Get plenty of rest for the remainder of the day. A responsible individual must stay with you for 24 hours following the procedure.  For the next 24 hours, DO NOT: -Drive a car -Paediatric nurse -Drink alcoholic beverages -Take any medication unless instructed by your physician -Make any legal decisions or sign important papers.  Meals: Start with liquid foods such as gelatin or soup. Progress to  regular foods as tolerated. Avoid greasy, spicy, heavy foods. If nausea and/or vomiting occur, drink only clear liquids until the nausea and/or vomiting subsides. Call your physician if vomiting continues.  Special Instructions/Symptoms: Your throat may feel dry or sore from the anesthesia or the breathing tube placed in your throat during surgery. If this causes discomfort, gargle with warm salt water. The discomfort should disappear within 24 hours.  If you had a scopolamine patch placed behind your ear for the management of post- operative nausea and/or vomiting:  1. The medication in the patch is effective for 72 hours, after which it should be removed.  Wrap patch in a tissue and discard in the trash. Wash hands thoroughly with soap and water. 2. You may remove the patch earlier than 72 hours if you experience unpleasant side effects which may include dry mouth, dizziness or visual disturbances. 3. Avoid touching the patch. Wash your hands with soap and water after contact with the patch.   Wylene Simmer, MD [facility]  Please read the following information regarding your care after surgery.  Medications  You only need a prescription for the narcotic pain medicine (ex. oxycodone, Percocet, Norco).  All of the other medicines listed below are available over the counter. X Aleve 2 pills twice a day for the first 3 days after surgery. X acetominophen (Tylenol) 650 mg every 4-6 hours as you need for minor to moderate pain X oxycodone as prescribed for severe pain  Narcotic pain medicine (ex. oxycodone, Percocet, Vicodin) will cause constipation.  To prevent this problem, take the following medicines while you are taking any pain medicine.  X docusate sodium (Colace) 100 mg twice a day X senna (Senokot) 2 tablets twice a day  X To help prevent blood clots, take a baby aspirin (81 mg) twice a day for two weeks after surgery.  You should also get up every hour while you are awake to  move around.    Weight Bearing ? Bear weight when you are able on your operated leg or foot. ? Bear weight only on your operated foot in the post-op shoe. X Do not bear any weight on the operated leg or foot.  Cast / Splint / Dressing X Keep your splint, cast or dressing clean and dry.  Dont put anything (coat hanger, pencil, etc) down inside of it.  If it gets damp, use a hair dryer on the cool setting to dry it.  If it gets soaked, call the office to schedule an appointment for a cast change. ? Remove your dressing 3 days after surgery and cover the incisions with dry dressings.    After your dressing, cast or splint is removed; you may shower, but do not soak or scrub the wound.  Allow the water to run over it, and then gently pat it dry.  Swelling It is normal for you to have swelling where you had surgery.  To reduce swelling and pain, keep your toes above your nose for at least 3 days after surgery.  It may be necessary to keep your foot or leg elevated for several weeks.  If it hurts, it should be elevated.  Follow Up Call my office at [phone] when you are discharged from the hospital or surgery center to schedule an appointment to be seen two weeks after surgery.  Call my office at [phone] if you develop a fever >101.5 F, nausea, vomiting, bleeding from the surgical site or severe pain.

## 2018-02-20 NOTE — Op Note (Signed)
02/20/2018  1:56 PM  PATIENT:  Anthony Hartman  77 y.o. male  PRE-OPERATIVE DIAGNOSIS: 1.  Right hallux valgus      2.  Right hallux rigidus      3.  Right forefoot metatarsalgia      4.  Right 2nd hammertoe  POST-OPERATIVE DIAGNOSIS:  Same  Procedure(s): #1.  Right silver bunionectomy 2.  Right hallux MP joint arthrodesis 3.  Right second metatarsal Weil osteotomy through separate incision 4.  Right hammertoe correction with flexor to extensor transfer 5.  Open flexor tenotomy of the right second toe 6.  AP and lateral radiographs of the right foot  SURGEON:  Wylene Simmer, MD  ASSISTANT: Sabino Gasser, PA-S2  ANESTHESIA:   MAC, regional  EBL:  minimal   TOURNIQUET:   Total Tourniquet Time Documented: Calf (Right) - 75 minutes Total: Calf (Right) - 75 minutes   COMPLICATIONS:  None apparent  DISPOSITION:  Extubated, awake and stable to recovery.  INDICATION FOR PROCEDURE: The patient is a 77 year old male with a long history of right forefoot pain after podiatric surgery several years ago.  He now has a severe bunion deformity with symptoms of hallux rigidus.  He also has metatarsalgia and a recurrent second hammertoe deformity.  He has failed nonoperative treatment to date including activity modification, oral anti-inflammatories and shoe wear modification.  He presents now for operative treatment of these painful forefoot deformities.  The risks and benefits of the alternative treatment options have been discussed in detail.  The patient wishes to proceed with surgery and specifically understands risks of bleeding, infection, nerve damage, blood clots, need for additional surgery, amputation and death.  PROCEDURE IN DETAIL:  After pre operative consent was obtained, and the correct operative site was identified, the patient was brought to the operating room and placed supine on the OR table.  Anesthesia was administered.  Pre-operative antibiotics were administered.  A  surgical timeout was taken.  The right lower extremity was prepped and draped in standard sterile fashion with a tourniquet around the calf.  The extremity was elevated and the Tourniquet was inflated to 200 mmHg.  A longitudinal incision was made over the hallux MP joint.  Dissection was carried down through the subtenons tissues.  The extensor tendons were protected.  The hypertrophic medial eminence was exposed and resected with the oscillating saw in line with the first metatarsal shaft.  The head of the metatarsal was then exposed.  A concave reamer was used to remove the remaining articular cartilage and subchondral bone.  A convex reamer was then used to remove the remaining articular cartilage and subchondral bone from the base of the proximal phalanx.  Wound was irrigated copiously.  The joint was reduced and provisionally pinned.  Radiographs confirmed appropriate alignment of the joint.  The joint was then compressed with a 4 mm partially-threaded cannulated screw from the Zimmer [employer] screw set.  A 2.5 mm Alps locking plate was then placed over the dorsum of the joint.  It was secured proximally and distally with locking and nonlocking screws.  AP and lateral radiographs showed appropriate position of the joint and appropriate length of all hardware.  Attention was turned to the second MTP joint where a longitudinal incision was made.  Dissection was carried down through the subtenons tissues.  The extensor tendons were lengthened.  A Weil osteotomy was then cut with the oscillating saw removing a small wedge of bone distally.  The metatarsal head was allowed to retract  proximally and was fixed with a 2 mm Biomet FRS screw.  A transverse incision was then made over the PIP joint of the second toe.  Dissection was carried down to the site of the original attempt at PIP joint arthrodesis.  The joint was again taken down with an oscillating saw.  It was irrigated and then reduced.  The joint was  fixed with a Biomet 3.4 mm VPC screw.  The toe had a tendency to float.  The decision was made to proceed to a flexor to extensor transfer.  An oblique incision was made on the plantar aspect of the forefoot.  The flexor tendon sheath was identified and incised.  The flexor digitorum longus tendon was released and passed in subperiosteal fashion along the base of the proximal phalanx to the dorsal incision.  The toe was reduced and the tendon was repaired over the dorsum of the proximal phalanx.  Final AP and lateral radiographs showed appropriate position and length of all hardware and appropriate correction of the hallux valgus and second hammertoe deformities.  Wounds were irrigated copiously and closed with Vicryl Monocryl and nylon.  Sterile dressings were applied followed by a well-padded short leg splint.  Tourniquet was released after application of the dressings.  Patient was awakened from anesthesia and transported to the recovery room in stable condition.   FOLLOW UP PLAN: Nonweightbearing on the right lower extremity in a short leg splint.  Follow-up in 2 weeks for suture removal and conversion to a cam walker boot.  Aspirin 81 mg p.o. twice daily for DVT prophylaxis.   RADIOGRAPHS: AP and lateral radiographs of the right foot show interval arthrodesis of the hallux MP joint with correction of the bunion deformity.  Interval correction of hammertoe deformity is also noted.  No acute injuries are noted.  Hardware is appropriately positioned and of the appropriate lengths.

## 2018-02-20 NOTE — H&P (Signed)
Anthony Hartman is an 77 y.o. male.   Chief Complaint: fight foot pain HPI: 77 y/o male with PMH of CAD c/o right foot pain from a bunion, hallux rigidus and a 2nd hammertoe.  He has failed nonop treatment and presents today for surgical correction.  Past Medical History:  Diagnosis Date  . Anxiety   . Arthritis   . Bladder cancer ([place])   . Bladder stone   . BPH (benign prostatic hypertrophy)   . Depression   . GERD (gastroesophageal reflux disease)   . History of bladder cancer    CARCINOMA IN SITU OF BLADDER  . History of kidney stones   . History of myocardial infarction    07-17-2011--   MI W/ OCCLUDED OM1. 2017 occluded RCA treated with a bare-metal stent, high-grade ramus intermediate stenosis in 2018 with failed attempted PCI  . Hyperlipidemia   . Hypertension   . Myocardial infarction [facility]) 2012   cardiac studies on chart  . OSA (obstructive sleep apnea)    cpap non-compliant   . Prostate cancer [facility])     Past Surgical History:  Procedure Laterality Date  . BACK SURGERY    . CARDIAC CATHETERIZATION  07-17-2011  DR ANDY CHIU ([facility])   OCCLUDED OM1 WITH UNSUCCESSFUL PCI ATTEMPT/ MILD TO MODERATE DISEASE ELSEWHERE  . CARDIOVASCULAR STRESS TEST  04/22/11  . CORONARY ANGIOPLASTY     2 stents  . CYSTO/ BLADDER BX'S  X2  2001  / X1  2003  . CYSTO/ BLADDER BX'S/ BILATERAL RETROGRADE URETEROPYLEGRAM  2003; 2007; 2008;  12-30-2007   CARCINOMA IN SITU OF BLADDER  . CYSTOSCOPY WITH LITHOLAPAXY N/A 05/07/2013   Procedure: CYSTOSCOPY WITH LITHOLAPAXY;  Surgeon: Franchot Gallo, MD;  Location: [facility];  Service: Urology;  Laterality: N/A;  . EHL Lengthening Right 05/10/2017   RT FOOT  . Hammer Toe Repair Right 05/10/2017   Rt #2 Toe  . LUMBAR DECOMPRESSION FORAMINOTOMY L3 -- L5/  REMOVAL CYST L4-5 FACET JOINT  08-21-2009   ALSO HAD PRIOR BACK SURG IN 1992  . LUMBAR LAMINECTOMY  04/04/2017   L2-3  . LUMBAR LAMINECTOMY/DECOMPRESSION MICRODISCECTOMY N/A 04/04/2017   Procedure: Laminectomy and Foraminotomy - Lumbar Two-Three;  Surgeon: Eustace Moore, MD;  Location: [place];  Service: Neurosurgery;  Laterality: N/A;  . OPEN TENOTOMY OF FLEXOR 2ND AND 3RD RIGHT TOES  APRIL 2008  . PROSTATE BIOPSY    . TRANSTHORACIC ECHOCARDIOGRAM  07-17-2011  [facility]   MILD TO MODERATE CONCENTRIC LVH/ NORMAL LVSF/ EF 55-60%/ MILD AORTIC STENOSIS  . TRANSURETHRAL RESECTION OF BLADDER TUMOR  01-10-2004  . TRANSURETHRAL RESECTION OF PROSTATE N/A 05/07/2013   Procedure: TRANSURETHRAL RESECTION OF THE PROSTATE WITH GYRUS INSTRUMENTS;  Surgeon: Franchot Gallo, MD;  Location: [facility];  Service: Urology;  Laterality: N/A;    Family History  Problem Relation Age of Onset  . Cancer Father   . Cancer Mother        breast   Social History:  reports that he quit smoking about 7 years ago. His smoking use included cigarettes. He has a 25.00 pack-year smoking history. His smokeless tobacco use includes snuff. He reports that he drinks about 1.2 oz of alcohol per week. He reports that he does not use drugs.  Allergies:  Allergies  Allergen Reactions  . Statins     MD ORDERS UNSPECIFIED REACTION    . Augmentin [Amoxicillin-Pot Clavulanate] Nausea And Vomiting     Has patient had a PCN reaction  causing immediate rash, facial/tongue/throat swelling, SOB or lightheadedness with hypotension: No Has patient had a PCN reaction causing severe rash involving mucus membranes or skin necrosis: No Has patient had a PCN reaction that required hospitalization No Has patient had a PCN reaction occurring within the last 10 years: No If all of the above answers are "NO", then may proceed with Cephalosporin use.   Marland Kitchen Dexamethasone Other (See Comments)    Frequent Urination [? HYPERGLYCEMIA? ]  . Sulfa Antibiotics Nausea And Vomiting    Medications Prior to Admission  Medication Sig Dispense Refill  . ALPRAZolam (XANAX) 0.5 MG tablet Take 0.25 mg by mouth at bedtime as needed for  anxiety.    Marland Kitchen amLODipine (NORVASC) 2.5 MG tablet Take 2.5 mg by mouth 2 (two) times daily.     Marland Kitchen aspirin EC 81 MG tablet Take 81 mg by mouth daily.    Marland Kitchen buPROPion (WELLBUTRIN XL) 300 MG 24 hr tablet Take 300 mg by mouth daily.    . carvedilol (COREG) 6.25 MG tablet Take 6.25 mg by mouth 2 (two) times daily with a meal.    . ciprofloxacin (CIPRO) 500 MG tablet Take 500 mg by mouth 2 (two) times daily.    . clopidogrel (PLAVIX) 75 MG tablet Take 75 mg by mouth daily.    . Coenzyme Q10 (COQ-10 PO) Take 1 tablet by mouth daily.    . Evolocumab with Infusor (Dupuyer) 420 MG/3.5ML SOCT Inject into the skin.    . meloxicam (MOBIC) 15 MG tablet Take 15 mg by mouth daily.     . Multiple Vitamin (MULTIVITAMIN WITH MINERALS) TABS tablet Take 1 tablet by mouth daily.    . ranitidine (ZANTAC) 150 MG tablet take one tablet twice daily    . rosuvastatin (CRESTOR) 20 MG tablet Take 1 tablet (20 mg total) by mouth daily. 90 tablet 3  . tamsulosin (FLOMAX) 0.4 MG CAPS capsule Take 0.4 mg by mouth.    . nitroGLYCERIN (NITROSTAT) 0.4 MG SL tablet Place 0.4 mg under the tongue every 5 (five) minutes as needed for chest pain.     . valACYclovir (VALTREX) 1000 MG tablet Take 1,000 mg by mouth 3 (three) times daily as needed (fever blisters).       No results found for this or any previous visit (from the past 48 hour(s)). No results found.  ROS  No recent f/n/v/c.  Blood pressure (!) 161/84, pulse 65, temperature 97.8 F (36.6 C), temperature source Oral, resp. rate 11, height 6\' 4"  (1.93 m), weight 103.9 kg (229 lb), SpO2 100 %. Physical Exam  wn wd male in nad.  A and O x 4.  Mood and affect normal.  EOMI.  resp unlabored.  R foot with bunion and 2nd hammertoe.  Skin healthy and intact.  No lymphadenopathy.  5/5 strength in PF and DF fo the toes.  Assessment/Plan R hallux rigidus, metatarsalgia, and 2nd hammertoe.  To OR for surgical correction.  The risks and benefits of the alternative  treatment options have been discussed in detail.  The patient wishes to proceed with surgery and specifically understands risks of bleeding, infection, nerve damage, blood clots, need for additional surgery, amputation and death.   Wylene Simmer, MD 02/27/2018, 12:05 PM

## 2018-02-20 NOTE — Anesthesia Preprocedure Evaluation (Addendum)
Anesthesia Evaluation  Patient identified by MRN, date of birth, ID band Patient awake    Reviewed: Allergy & Precautions, H&P , NPO status , Patient's Chart, lab work & pertinent test results  Airway Mallampati: II  TM Distance: >3 FB Neck ROM: Full    Dental no notable dental hx. (+) Teeth Intact, Dental Advisory Given   Pulmonary sleep apnea , former smoker,    Pulmonary exam normal breath sounds clear to auscultation       Cardiovascular Exercise Tolerance: Good hypertension, Pt. on medications and Pt. on home beta blockers + CAD, + Past MI and + Cardiac Stents   Rhythm:Regular Rate:Normal  Carotid duplex 02/11/2018 Bilateral carotid stenosis 1-39%   Neuro/Psych  Headaches, Anxiety Depression negative psych ROS   GI/Hepatic Neg liver ROS, GERD  Medicated and Controlled,  Endo/Other  negative endocrine ROS  Renal/GU negative Renal ROS  negative genitourinary   Musculoskeletal  (+) Arthritis , Osteoarthritis,    Abdominal   Peds  Hematology negative hematology ROS (+)   Anesthesia Other Findings   Reproductive/Obstetrics negative OB ROS                             Anesthesia Physical  Anesthesia Plan  ASA: III  Anesthesia Plan: Regional   Post-op Pain Management:    Induction:   PONV Risk Score and Plan: 2 and Ondansetron and Propofol infusion  Airway Management Planned:   Additional Equipment: Arterial line  Intra-op Plan:   Post-operative Plan:   Informed Consent: I have reviewed the patients History and Physical, chart, labs and discussed the procedure including the risks, benefits and alternatives for the proposed anesthesia with the patient or authorized representative who has indicated his/her understanding and acceptance.   Dental advisory given  Plan Discussed with: CRNA  Anesthesia Plan Comments:        Anesthesia Quick Evaluation

## 2018-02-20 NOTE — Anesthesia Procedure Notes (Signed)
Procedure Name: MAC Date/Time: 02/20/2018 1:00 PM Performed by: Signe Colt, CRNA Pre-anesthesia Checklist: Patient identified, Emergency Drugs available, Suction available, Patient being monitored and Timeout performed Patient Re-evaluated:Patient Re-evaluated prior to induction Oxygen Delivery Method: Simple face mask

## 2018-02-20 NOTE — Progress Notes (Signed)
Assisted Dr. Germeroth with left, ultrasound guided, popliteal block. Side rails up, monitors on throughout procedure. See vital signs in flow sheet. Tolerated Procedure well. 

## 2018-02-20 NOTE — Transfer of Care (Signed)
Immediate Anesthesia Transfer of Care Note  Patient: SANDON YOHO  Procedure(s) Performed: Right Hallux Metatarsal Phalangeal Joint Arthrodesis; Right Silver Bunionectomy (Right Foot) Right Second Metatarsal Weil Ostetomy and Revision Hammertoe Correction (Right Foot)  Patient Location: PACU  Anesthesia Type:MAC combined with regional for post-op pain  Level of Consciousness: awake, alert , oriented and patient cooperative  Airway & Oxygen Therapy: Patient Spontanous Breathing and Patient connected to face mask oxygen  Post-op Assessment: Report given to RN and Post -op Vital signs reviewed and stable  Post vital signs: Reviewed and stable  Last Vitals:  Vitals Value Taken Time  BP    Temp    Pulse 59 02/20/2018  1:53 PM  Resp 8 02/20/2018  1:53 PM  SpO2 100 % 02/20/2018  1:53 PM  Vitals shown include unvalidated device data.  Last Pain:  Vitals:   02/20/18 1026  TempSrc: Oral         Complications: No apparent anesthesia complications

## 2018-02-21 ENCOUNTER — Encounter (HOSPITAL_BASED_OUTPATIENT_CLINIC_OR_DEPARTMENT_OTHER): Payer: Self-pay | Admitting: Orthopedic Surgery

## 2018-02-21 NOTE — Anesthesia Postprocedure Evaluation (Signed)
Anesthesia Post Note  Patient: AKSHATH MCCAREY  Procedure(s) Performed: Right Hallux Metatarsal Phalangeal Joint Arthrodesis; Right Silver Bunionectomy (Right Foot) Right Second Metatarsal Weil Ostetomy and Revision Hammertoe Correction (Right Foot)     Patient location during evaluation: PACU Anesthesia Type: General Level of consciousness: sedated and patient cooperative Pain management: pain level controlled Vital Signs Assessment: post-procedure vital signs reviewed and stable Respiratory status: spontaneous breathing Cardiovascular status: stable Anesthetic complications: no    Last Vitals:  Vitals:   02/20/18 1545 02/20/18 1615  BP:  (!) 144/72  Pulse:  (!) 56  Resp:  16  Temp:  36.6 C  SpO2: 99% 99%    Last Pain:  Vitals:   02/20/18 1615  TempSrc:   PainSc: 0-No pain                 Nolon Nations

## 2018-02-26 DIAGNOSIS — N35011 Post-traumatic bulbous urethral stricture: Secondary | ICD-10-CM | POA: Diagnosis not present

## 2018-02-26 DIAGNOSIS — R300 Dysuria: Secondary | ICD-10-CM | POA: Diagnosis not present

## 2018-02-26 DIAGNOSIS — C61 Malignant neoplasm of prostate: Secondary | ICD-10-CM | POA: Diagnosis not present

## 2018-02-26 DIAGNOSIS — C670 Malignant neoplasm of trigone of bladder: Secondary | ICD-10-CM | POA: Diagnosis not present

## 2018-03-07 DIAGNOSIS — M2021 Hallux rigidus, right foot: Secondary | ICD-10-CM | POA: Diagnosis not present

## 2018-03-07 DIAGNOSIS — M21619 Bunion of unspecified foot: Secondary | ICD-10-CM | POA: Diagnosis not present

## 2018-03-07 DIAGNOSIS — M79671 Pain in right foot: Secondary | ICD-10-CM | POA: Diagnosis not present

## 2018-03-07 DIAGNOSIS — M2041 Other hammer toe(s) (acquired), right foot: Secondary | ICD-10-CM | POA: Diagnosis not present

## 2018-03-10 DIAGNOSIS — I2510 Atherosclerotic heart disease of native coronary artery without angina pectoris: Secondary | ICD-10-CM | POA: Diagnosis not present

## 2018-03-10 DIAGNOSIS — M170 Bilateral primary osteoarthritis of knee: Secondary | ICD-10-CM | POA: Diagnosis not present

## 2018-03-10 DIAGNOSIS — Z79899 Other long term (current) drug therapy: Secondary | ICD-10-CM | POA: Diagnosis not present

## 2018-03-10 DIAGNOSIS — E782 Mixed hyperlipidemia: Secondary | ICD-10-CM | POA: Diagnosis not present

## 2018-03-10 DIAGNOSIS — E119 Type 2 diabetes mellitus without complications: Secondary | ICD-10-CM | POA: Diagnosis not present

## 2018-03-10 DIAGNOSIS — I10 Essential (primary) hypertension: Secondary | ICD-10-CM | POA: Diagnosis not present

## 2018-03-11 DIAGNOSIS — R300 Dysuria: Secondary | ICD-10-CM | POA: Diagnosis not present

## 2018-04-04 DIAGNOSIS — M21611 Bunion of right foot: Secondary | ICD-10-CM | POA: Diagnosis not present

## 2018-04-04 DIAGNOSIS — M21619 Bunion of unspecified foot: Secondary | ICD-10-CM | POA: Diagnosis not present

## 2018-04-04 DIAGNOSIS — M2041 Other hammer toe(s) (acquired), right foot: Secondary | ICD-10-CM | POA: Diagnosis not present

## 2018-04-04 DIAGNOSIS — M79671 Pain in right foot: Secondary | ICD-10-CM | POA: Diagnosis not present

## 2018-04-04 DIAGNOSIS — Z4789 Encounter for other orthopedic aftercare: Secondary | ICD-10-CM | POA: Diagnosis not present

## 2018-04-15 DIAGNOSIS — R351 Nocturia: Secondary | ICD-10-CM | POA: Diagnosis not present

## 2018-04-15 DIAGNOSIS — R350 Frequency of micturition: Secondary | ICD-10-CM | POA: Diagnosis not present

## 2018-05-05 DIAGNOSIS — M2041 Other hammer toe(s) (acquired), right foot: Secondary | ICD-10-CM | POA: Diagnosis not present

## 2018-05-05 DIAGNOSIS — M2021 Hallux rigidus, right foot: Secondary | ICD-10-CM | POA: Diagnosis not present

## 2018-05-05 DIAGNOSIS — M79671 Pain in right foot: Secondary | ICD-10-CM | POA: Diagnosis not present

## 2018-05-05 DIAGNOSIS — M21611 Bunion of right foot: Secondary | ICD-10-CM | POA: Diagnosis not present

## 2018-05-19 DIAGNOSIS — L570 Actinic keratosis: Secondary | ICD-10-CM | POA: Diagnosis not present

## 2018-05-19 DIAGNOSIS — L0201 Cutaneous abscess of face: Secondary | ICD-10-CM | POA: Diagnosis not present

## 2018-05-19 DIAGNOSIS — G4733 Obstructive sleep apnea (adult) (pediatric): Secondary | ICD-10-CM | POA: Diagnosis not present

## 2018-05-19 DIAGNOSIS — I10 Essential (primary) hypertension: Secondary | ICD-10-CM | POA: Diagnosis not present

## 2018-05-19 DIAGNOSIS — I2510 Atherosclerotic heart disease of native coronary artery without angina pectoris: Secondary | ICD-10-CM | POA: Diagnosis not present

## 2018-05-19 DIAGNOSIS — I252 Old myocardial infarction: Secondary | ICD-10-CM | POA: Diagnosis not present

## 2018-05-19 DIAGNOSIS — L578 Other skin changes due to chronic exposure to nonionizing radiation: Secondary | ICD-10-CM | POA: Diagnosis not present

## 2018-06-12 HISTORY — PX: TRANSTHORACIC ECHOCARDIOGRAM: SHX275

## 2018-06-19 DIAGNOSIS — Z6828 Body mass index (BMI) 28.0-28.9, adult: Secondary | ICD-10-CM | POA: Diagnosis not present

## 2018-06-19 DIAGNOSIS — I252 Old myocardial infarction: Secondary | ICD-10-CM | POA: Diagnosis not present

## 2018-06-19 DIAGNOSIS — G4733 Obstructive sleep apnea (adult) (pediatric): Secondary | ICD-10-CM | POA: Diagnosis not present

## 2018-06-19 DIAGNOSIS — Z87891 Personal history of nicotine dependence: Secondary | ICD-10-CM | POA: Diagnosis not present

## 2018-06-19 DIAGNOSIS — I371 Nonrheumatic pulmonary valve insufficiency: Secondary | ICD-10-CM | POA: Diagnosis not present

## 2018-06-19 DIAGNOSIS — I351 Nonrheumatic aortic (valve) insufficiency: Secondary | ICD-10-CM | POA: Diagnosis not present

## 2018-06-19 DIAGNOSIS — I6523 Occlusion and stenosis of bilateral carotid arteries: Secondary | ICD-10-CM | POA: Diagnosis not present

## 2018-06-19 DIAGNOSIS — I10 Essential (primary) hypertension: Secondary | ICD-10-CM | POA: Diagnosis not present

## 2018-06-19 DIAGNOSIS — I2510 Atherosclerotic heart disease of native coronary artery without angina pectoris: Secondary | ICD-10-CM | POA: Diagnosis not present

## 2018-07-11 DIAGNOSIS — H1131 Conjunctival hemorrhage, right eye: Secondary | ICD-10-CM | POA: Diagnosis not present

## 2018-07-17 DIAGNOSIS — M2042 Other hammer toe(s) (acquired), left foot: Secondary | ICD-10-CM | POA: Diagnosis not present

## 2018-07-17 DIAGNOSIS — M79672 Pain in left foot: Secondary | ICD-10-CM | POA: Diagnosis not present

## 2018-07-17 DIAGNOSIS — M79671 Pain in right foot: Secondary | ICD-10-CM | POA: Diagnosis not present

## 2018-07-17 DIAGNOSIS — M2041 Other hammer toe(s) (acquired), right foot: Secondary | ICD-10-CM | POA: Diagnosis not present

## 2018-07-29 DIAGNOSIS — J029 Acute pharyngitis, unspecified: Secondary | ICD-10-CM | POA: Diagnosis not present

## 2018-07-30 DIAGNOSIS — G4733 Obstructive sleep apnea (adult) (pediatric): Secondary | ICD-10-CM | POA: Diagnosis not present

## 2018-07-31 DIAGNOSIS — G4733 Obstructive sleep apnea (adult) (pediatric): Secondary | ICD-10-CM | POA: Diagnosis not present

## 2018-07-31 DIAGNOSIS — R634 Abnormal weight loss: Secondary | ICD-10-CM | POA: Diagnosis not present

## 2018-08-08 DIAGNOSIS — C61 Malignant neoplasm of prostate: Secondary | ICD-10-CM | POA: Diagnosis not present

## 2018-08-08 DIAGNOSIS — E349 Endocrine disorder, unspecified: Secondary | ICD-10-CM | POA: Diagnosis not present

## 2018-08-08 DIAGNOSIS — C670 Malignant neoplasm of trigone of bladder: Secondary | ICD-10-CM | POA: Diagnosis not present

## 2018-08-08 DIAGNOSIS — N35011 Post-traumatic bulbous urethral stricture: Secondary | ICD-10-CM | POA: Diagnosis not present

## 2018-08-09 DIAGNOSIS — I700 Atherosclerosis of aorta: Secondary | ICD-10-CM | POA: Diagnosis not present

## 2018-08-09 DIAGNOSIS — J920 Pleural plaque with presence of asbestos: Secondary | ICD-10-CM | POA: Diagnosis not present

## 2018-08-09 DIAGNOSIS — K563 Gallstone ileus: Secondary | ICD-10-CM | POA: Diagnosis not present

## 2018-08-09 DIAGNOSIS — I2510 Atherosclerotic heart disease of native coronary artery without angina pectoris: Secondary | ICD-10-CM | POA: Diagnosis not present

## 2018-08-09 DIAGNOSIS — J439 Emphysema, unspecified: Secondary | ICD-10-CM | POA: Diagnosis not present

## 2018-08-11 DIAGNOSIS — M21612 Bunion of left foot: Secondary | ICD-10-CM | POA: Diagnosis not present

## 2018-08-22 DIAGNOSIS — N390 Urinary tract infection, site not specified: Secondary | ICD-10-CM | POA: Diagnosis not present

## 2018-08-22 DIAGNOSIS — G4733 Obstructive sleep apnea (adult) (pediatric): Secondary | ICD-10-CM | POA: Diagnosis not present

## 2018-08-22 DIAGNOSIS — M5000 Cervical disc disorder with myelopathy, unspecified cervical region: Secondary | ICD-10-CM | POA: Diagnosis not present

## 2018-08-22 DIAGNOSIS — M2021 Hallux rigidus, right foot: Secondary | ICD-10-CM | POA: Diagnosis not present

## 2018-09-10 DIAGNOSIS — M150 Primary generalized (osteo)arthritis: Secondary | ICD-10-CM | POA: Diagnosis not present

## 2018-09-10 DIAGNOSIS — M5090 Cervical disc disorder, unspecified, unspecified cervical region: Secondary | ICD-10-CM | POA: Diagnosis not present

## 2018-09-10 DIAGNOSIS — I2510 Atherosclerotic heart disease of native coronary artery without angina pectoris: Secondary | ICD-10-CM | POA: Diagnosis not present

## 2018-09-10 DIAGNOSIS — G4733 Obstructive sleep apnea (adult) (pediatric): Secondary | ICD-10-CM | POA: Diagnosis not present

## 2018-09-10 DIAGNOSIS — K219 Gastro-esophageal reflux disease without esophagitis: Secondary | ICD-10-CM | POA: Diagnosis not present

## 2018-09-10 DIAGNOSIS — D638 Anemia in other chronic diseases classified elsewhere: Secondary | ICD-10-CM | POA: Diagnosis not present

## 2018-09-10 DIAGNOSIS — E119 Type 2 diabetes mellitus without complications: Secondary | ICD-10-CM | POA: Diagnosis not present

## 2018-09-10 DIAGNOSIS — Z79899 Other long term (current) drug therapy: Secondary | ICD-10-CM | POA: Diagnosis not present

## 2018-09-10 DIAGNOSIS — I10 Essential (primary) hypertension: Secondary | ICD-10-CM | POA: Diagnosis not present

## 2018-09-10 DIAGNOSIS — Z23 Encounter for immunization: Secondary | ICD-10-CM | POA: Diagnosis not present

## 2018-09-10 DIAGNOSIS — M170 Bilateral primary osteoarthritis of knee: Secondary | ICD-10-CM | POA: Diagnosis not present

## 2018-09-10 DIAGNOSIS — Z0000 Encounter for general adult medical examination without abnormal findings: Secondary | ICD-10-CM | POA: Diagnosis not present

## 2018-09-10 DIAGNOSIS — F418 Other specified anxiety disorders: Secondary | ICD-10-CM | POA: Diagnosis not present

## 2018-09-10 DIAGNOSIS — C61 Malignant neoplasm of prostate: Secondary | ICD-10-CM | POA: Diagnosis not present

## 2018-09-10 DIAGNOSIS — I6523 Occlusion and stenosis of bilateral carotid arteries: Secondary | ICD-10-CM | POA: Diagnosis not present

## 2018-09-11 DIAGNOSIS — G4733 Obstructive sleep apnea (adult) (pediatric): Secondary | ICD-10-CM | POA: Diagnosis not present

## 2018-09-11 DIAGNOSIS — R634 Abnormal weight loss: Secondary | ICD-10-CM | POA: Diagnosis not present

## 2018-09-11 DIAGNOSIS — J40 Bronchitis, not specified as acute or chronic: Secondary | ICD-10-CM | POA: Diagnosis not present

## 2018-09-17 DIAGNOSIS — G4733 Obstructive sleep apnea (adult) (pediatric): Secondary | ICD-10-CM | POA: Diagnosis not present

## 2018-09-17 DIAGNOSIS — J40 Bronchitis, not specified as acute or chronic: Secondary | ICD-10-CM | POA: Diagnosis not present

## 2018-09-17 DIAGNOSIS — I2510 Atherosclerotic heart disease of native coronary artery without angina pectoris: Secondary | ICD-10-CM | POA: Diagnosis not present

## 2018-10-15 DIAGNOSIS — J209 Acute bronchitis, unspecified: Secondary | ICD-10-CM | POA: Diagnosis not present

## 2018-10-15 DIAGNOSIS — S51002A Unspecified open wound of left elbow, initial encounter: Secondary | ICD-10-CM | POA: Diagnosis not present

## 2018-10-15 DIAGNOSIS — S20219A Contusion of unspecified front wall of thorax, initial encounter: Secondary | ICD-10-CM | POA: Diagnosis not present

## 2018-10-16 ENCOUNTER — Other Ambulatory Visit: Payer: Self-pay

## 2018-10-16 DIAGNOSIS — S20219A Contusion of unspecified front wall of thorax, initial encounter: Secondary | ICD-10-CM | POA: Diagnosis not present

## 2018-10-16 NOTE — Patient Outreach (Signed)
[facility]) Care Management  10/16/2018  LA DIBELLA [birth year] [phone]  Nurse Call Line Referral Date: 10/15/18 Reason for Referral: chest pain due recent fall  Nurse call line recommendation: see doctor within 24 hours  Telephone call to patient regarding nurse call line referral. HIPAA verified with patient. Explained reason for  Call.  Patient states he recently fell and hit the left side of his chest and cut his left arm.  Patient states she went to the urgent care as instructed by the 24 hour nurse line. Patient reports that xrays were taken and he did not have any broken ribs. Patient states his left side if very painful and he had difficulty sleeping.  Patient reports his pain level at a 9 or 10 on a scale of 0 to 10.  Patient states it was hard getting out of the bed on his left side. Patient states the doctor dressed his cut to his left arm.   Patient states he also has chest congestions and a cough.  Patient states when he coughs the pain on the left side of his chest is excruciating.  He states he has been treated recently with antibiotics but continues to have the congestion and cough.  Patient states the urgent care doctor prescribed him cough medication and a pain medication.   RNCM advised patient to contact his primary care provider office to day and report his symptoms.  RNCM advised patient to hold small pillow against his left side when he coughs to brace the area.  RNCM advised patient to take his pain medication as prescribed by his doctor.   Patient verbalized understanding. Patient states she will call his primary doctor as prescribed.   Patient verbally agreed to follow up call with RNCM   PLAN: RNCM will follow up with patient within 4 business days.   Quinn Plowman RN,BSN,CCM [facility] Telephonic  [phone]

## 2018-10-20 ENCOUNTER — Other Ambulatory Visit: Payer: Self-pay

## 2018-10-20 DIAGNOSIS — J929 Pleural plaque without asbestos: Secondary | ICD-10-CM | POA: Diagnosis not present

## 2018-10-20 DIAGNOSIS — S40812D Abrasion of left upper arm, subsequent encounter: Secondary | ICD-10-CM | POA: Diagnosis not present

## 2018-10-20 DIAGNOSIS — J449 Chronic obstructive pulmonary disease, unspecified: Secondary | ICD-10-CM | POA: Diagnosis not present

## 2018-10-20 DIAGNOSIS — S20212D Contusion of left front wall of thorax, subsequent encounter: Secondary | ICD-10-CM | POA: Diagnosis not present

## 2018-10-20 NOTE — Patient Outreach (Signed)
[facility] Care Management  10/20/2018  LAKEITH CAREAGA [birth year] [id number]  Nurse Call Line Referral Date: 10/15/18 Reason for Referral: chest pain due recent fall  Nurse call line recommendation: see doctor within 24 hours  Telephone call to patient for nurse call line follow up. HIPAA verifed with patient. Patient voiced frustration that he attempted to get in to see his primary MD regarding the ongoing chest/ rib pain but was told by the office they could not see him until Monday 10/20/18.  Patient states he went back to the urgent care to be rechecked.  Patient states the pain has eased some in his rib area. Patient states he continues to have some bleeding from the left arm cut. Patient states the urgent care doctor re-evaluated both his rib area and arm.  Patient states his wife continues to put a dressing on the cut daily.  Patient states he held the pillow against his chest when he needed to cough as advised by this RNCM. Patient states it helped very well. Patient voiced appreciation of suggestions. Patient voiced again he was very frustrated that his primary MD office could not get him in to be see about his concerns until today 09/20/18.  Patient states otherwise, he is doing ok at this time.   RNCM advised patient to notify MD of any changes in condition prior to scheduled appointment. RNCM verified patient aware of 911 services for urgent/ emergent needs.  PLAN:  RNCM will close patient due to patient being assessed and having no further needs.   Quinn Plowman RN,BSN,CCM [address] [facility] Telephonic  [phone]

## 2018-10-27 DIAGNOSIS — Y998 Other external cause status: Secondary | ICD-10-CM | POA: Diagnosis not present

## 2018-10-27 DIAGNOSIS — R05 Cough: Secondary | ICD-10-CM | POA: Diagnosis not present

## 2018-10-27 DIAGNOSIS — J159 Unspecified bacterial pneumonia: Secondary | ICD-10-CM | POA: Diagnosis not present

## 2018-10-27 DIAGNOSIS — Z87891 Personal history of nicotine dependence: Secondary | ICD-10-CM | POA: Diagnosis not present

## 2018-10-27 DIAGNOSIS — E785 Hyperlipidemia, unspecified: Secondary | ICD-10-CM | POA: Diagnosis not present

## 2018-10-27 DIAGNOSIS — J189 Pneumonia, unspecified organism: Secondary | ICD-10-CM | POA: Diagnosis not present

## 2018-10-27 DIAGNOSIS — J449 Chronic obstructive pulmonary disease, unspecified: Secondary | ICD-10-CM | POA: Diagnosis not present

## 2018-10-27 DIAGNOSIS — I10 Essential (primary) hypertension: Secondary | ICD-10-CM | POA: Diagnosis not present

## 2018-10-27 DIAGNOSIS — G4733 Obstructive sleep apnea (adult) (pediatric): Secondary | ICD-10-CM | POA: Diagnosis not present

## 2018-10-27 DIAGNOSIS — X58XXXA Exposure to other specified factors, initial encounter: Secondary | ICD-10-CM | POA: Diagnosis not present

## 2018-10-27 DIAGNOSIS — Z9119 Patient's noncompliance with other medical treatment and regimen: Secondary | ICD-10-CM | POA: Diagnosis not present

## 2018-10-27 DIAGNOSIS — M150 Primary generalized (osteo)arthritis: Secondary | ICD-10-CM | POA: Diagnosis not present

## 2018-10-27 DIAGNOSIS — J440 Chronic obstructive pulmonary disease with acute lower respiratory infection: Secondary | ICD-10-CM | POA: Diagnosis not present

## 2018-10-27 DIAGNOSIS — Z955 Presence of coronary angioplasty implant and graft: Secondary | ICD-10-CM | POA: Diagnosis not present

## 2018-10-27 DIAGNOSIS — F341 Dysthymic disorder: Secondary | ICD-10-CM | POA: Diagnosis not present

## 2018-10-27 DIAGNOSIS — K219 Gastro-esophageal reflux disease without esophagitis: Secondary | ICD-10-CM | POA: Diagnosis not present

## 2018-10-27 DIAGNOSIS — M1990 Unspecified osteoarthritis, unspecified site: Secondary | ICD-10-CM | POA: Diagnosis not present

## 2018-10-27 DIAGNOSIS — F419 Anxiety disorder, unspecified: Secondary | ICD-10-CM | POA: Diagnosis not present

## 2018-10-27 DIAGNOSIS — S2232XA Fracture of one rib, left side, initial encounter for closed fracture: Secondary | ICD-10-CM | POA: Diagnosis not present

## 2018-10-27 DIAGNOSIS — R9431 Abnormal electrocardiogram [ECG] [EKG]: Secondary | ICD-10-CM | POA: Diagnosis not present

## 2018-10-27 DIAGNOSIS — I2510 Atherosclerotic heart disease of native coronary artery without angina pectoris: Secondary | ICD-10-CM | POA: Diagnosis not present

## 2018-10-27 DIAGNOSIS — R509 Fever, unspecified: Secondary | ICD-10-CM | POA: Diagnosis not present

## 2018-10-27 DIAGNOSIS — I252 Old myocardial infarction: Secondary | ICD-10-CM | POA: Diagnosis not present

## 2018-11-03 DIAGNOSIS — J189 Pneumonia, unspecified organism: Secondary | ICD-10-CM | POA: Diagnosis not present

## 2018-11-03 DIAGNOSIS — I10 Essential (primary) hypertension: Secondary | ICD-10-CM | POA: Diagnosis not present

## 2018-11-04 ENCOUNTER — Other Ambulatory Visit: Payer: Self-pay

## 2018-11-04 NOTE — Patient Outreach (Signed)
[facility]) Care Management  11/04/2018  Anthony Hartman [birth year] [id number]  Transition of care  Referral date: 10/30/18 Referral source: discharged from an inpatient admission from [facility] on 10/29/18. Insurance: Health team advantage Attempt #1  Telephone call to patient regarding referral. Unable to reach patient. HIPAA compliant voice message left with call back phone number.   PLAN: RNCM will attempt 2nd telephone call to patient within 4 business days. RNCM will send outreach letter.   Quinn Plowman RN,BSN, [place] Telephonic  [phone]

## 2018-11-10 ENCOUNTER — Other Ambulatory Visit: Payer: Self-pay

## 2018-11-10 ENCOUNTER — Ambulatory Visit: Payer: Self-pay

## 2018-11-10 NOTE — Patient Outreach (Signed)
[place] [facility]) Care Management  11/10/2018  Anthony Hartman 1940-11-17 [id number]  Transition of care  Referral date: 10/30/18 Referral source: discharged from an inpatient admission from [facility] on 10/29/18. Insurance: Health team advantage Attempt #2  Telephone call to patient regarding transition of care referral. Unable to reach patient. HIPAA compliant voice message left with call back phone number.   PLAN:  RNCM will attempt 3rd telephone call to patient within 4 business days.   Quinn Plowman RN,BSN,CCM [facility] Telephonic  [phone]

## 2018-11-11 ENCOUNTER — Other Ambulatory Visit: Payer: Self-pay

## 2018-11-11 NOTE — Patient Outreach (Signed)
[place] [facility]) Care Management  11/11/2018  BAY WAYSON 1941/02/13 [id number]  Transition of care  Referral date:10/30/18 Referral source:discharged from an inpatient admission from [facility] on 10/29/18. Insurance:Health team advantage Attempt #3  Telephone call to patient regarding transition of care referral. Unable to reach patient. HIPAA compliant voice message left with call back phone number.   PLAN:  If no response will proceed with closure.   Quinn Plowman RN,BSN,CCM [facility] Telephonic  [phone]

## 2018-11-25 ENCOUNTER — Other Ambulatory Visit: Payer: Self-pay

## 2018-11-25 NOTE — Patient Outreach (Signed)
[place]) Care Management  11/25/2018  CULVER FEIGHNER [birth year] [id number]  Transition of care  Referral date:10/30/18 Referral source:discharged from an inpatient admission from [facility] on 10/29/18. Insurance:Health team advantage  No response after 3 telephone calls and outreach letter attempt.  PLAN: RNCM will close patient due to being unable to reach.  RNCM will send closure notification to patient's primary MD   Quinn Plowman RN,BSN,CCM [facility] Telephonic  [phone]

## 2019-01-26 ENCOUNTER — Encounter: Payer: Self-pay | Admitting: Cardiology

## 2019-01-30 ENCOUNTER — Telehealth: Payer: Self-pay | Admitting: Cardiology

## 2019-01-30 NOTE — Telephone Encounter (Signed)
Patient called stating the doctor called him.  I do see he has an appt on 3/25 with Dr. Percival Spanish.

## 2019-01-30 NOTE — Telephone Encounter (Signed)
   Primary Cardiologist:  Dr Percival Spanish  Patient contacted.  History reviewed.  No symptoms to suggest any unstable cardiac conditions.  Based on discussion, with current pandemic situation, we will be postponing this appointment for Anthony Hartman.  If symptoms change, he has been instructed to contact our office.   Routing to C19 CANCEL pool for tracking (P CV DIV CV19 CANCEL) and assigning priority (1 = 4-6 wks, 2 = 6-12 wks, 3 = >12 wks).  [name], [place]  01/30/2019 5:03 PM         .

## 2019-01-30 NOTE — Telephone Encounter (Signed)
Will forward to [place] who is calling Dr Hochrein's patients for next week

## 2019-02-04 ENCOUNTER — Ambulatory Visit: Payer: PPO | Admitting: Cardiology

## 2019-02-18 ENCOUNTER — Telehealth: Payer: Self-pay | Admitting: Physician Assistant

## 2019-02-18 ENCOUNTER — Telehealth: Payer: Self-pay | Admitting: Cardiology

## 2019-02-18 ENCOUNTER — Encounter: Payer: Self-pay | Admitting: Physician Assistant

## 2019-02-18 MED ORDER — HYDROCHLOROTHIAZIDE 12.5 MG PO CAPS: 12.5000 mg | ORAL_CAPSULE | Freq: Every day | ORAL | 1 refills | Status: DC

## 2019-02-18 NOTE — Telephone Encounter (Signed)
Called patient, he states that he has had some increased swelling in his legs and ankles. He states no changes in his diet, he is taking medications as prescribed, but he has also noticed his BP moving around and he has been having some chest pain. His blood pressure this morning was 111/59 HR 59, and when he works outside it jumps to 146/63 HR 63 and the highest 155/76. He states when he gets worked up like that he did experience chest pain and after about 10 minutes of resting it went away. He does state about 3 days ago he had chest pain and he took isosorbide 30 mg, that was given to him by his [facility] Cardiologist Dr.Chicu. he states once he took that the pain went away, he does have nitroglycerin on file but he states he does not like that medication as it makes him feel bad. Patient does not have any weight gain, has SOB only with activities I advised I would route a message for recommendations. Patient was taking Repatha injections but has stopped due to price and no other cholesterol medications have been given for him, he can not take statins as they give him joint, those are the only med changes.

## 2019-02-18 NOTE — Telephone Encounter (Addendum)
Spoke to pt and pt wife. Pt scheduled for video visit with Lauretta Chester, PA tomorrow at 11 AM via [email or web address]. I am sending the consent to pt wife email and they requested that the link also be sent to her email for visit tomorrow.   EMAIL: [email or web address]  SMARTPHONE: Yes  ABLE TO CHECK VITALS: Yes  MYCHART ACCESS: No

## 2019-02-18 NOTE — Telephone Encounter (Signed)
Message received from Triage.  I called and spoke with the patient.   Last few days in the evenings, ankles and feet have been swollen. Swelling improves in the morning. EF in 07/07/18 was 50-55%. He has not gained or lost weight. Swelling L > R, but swelling in both. Feet feels tingling - tingling pain in his lower calf to his feet bilaterally.   BP has been running 111/59. Hypertensive with yard work.   Chest pain last week with work in the yard, relieved with rest. Also relieved with imdur. He has a prn script for imdur from different cardiologist.    Plan: D/C 2.5 mg norvasc.  Start 12.5 mg HCTZ  He will continue daily weights and daily BP.  I will request an evisit with an APP on Dr. Rosezella [place] team. Pt is amenable to a VIDEO visit with his smartphone.  I have texted him a link to set up [place].

## 2019-02-18 NOTE — Progress Notes (Signed)
Virtual Visit via Video Note   This visit type was conducted due to national recommendations for restrictions regarding the COVID-19 Pandemic (e.g. social distancing) in an effort to limit this patient's exposure and mitigate transmission in our community.  Due to his co-morbid illnesses, this patient is at least at moderate risk for complications without adequate follow up.  This format is felt to be most appropriate for this patient at this time.  All issues noted in this document were discussed and addressed.  A limited physical exam was performed with this format.  Please refer to the patient's chart for his consent to telehealth for [facility].  Evaluation Performed:  Follow-up visit  This visit type was conducted due to national recommendations for restrictions regarding the COVID-19 Pandemic (e.g. social distancing).  This format is felt to be most appropriate for this patient at this time.  All issues noted in this document were discussed and addressed.  No physical exam was performed (except for noted visual exam findings with Video Visits).  Please refer to the patient's chart (MyChart message for video visits and phone note for telephone visits) for the patient's consent to telehealth for [facility]  Date:  02/19/2019   ID:  Anthony Hartman, DOB [birth year], MRN [id number]  Patient Location:  Home  Provider location:   Office  PCP:  Raina Mina., MD  Cardiologist:  Minus Breeding, MD 02/05/2018 [place] cardiologist, Dr. Wyline Copas, 11/24/2018 Electrophysiologist:  None   Chief Complaint:  LE edema  History of Present Illness:    Anthony Hartman is a 78 y.o. male who presents via audio/video conferencing for a telehealth visit today.    78 y.o. yo male who has a hx of CAD w/ 100% OM 2012 and 2017 BMS RCA, 2018 RI failed PCI, DM2, HTN, HLD intol statins>>referred for PCSK9, GERD, bladder CA, OSA on CPAP, spinal stenosis s/p surgery, mild carotid dz, depression   Phone notes 03/20 and 04/08 regarding lower extremity edema, hypertension and chest pain  The patient does not have symptoms concerning for COVID-19 infection (fever, chills, cough, or new shortness of breath).   He has been having problems w/ LLE edema, worse last night.  His left ankle chronically swells and it got very large last night.  He put it up and then slept with it up overnight.  He had to get up 5 times to urinate last night and then when he got up this morning, it was much smaller.  He has compression socks that he got when he was in the hospital, but does not wear them.  He denies orthopnea or PND.  He feels that his dyspnea on exertion is a little bit improved from his baseline.  He feels this is thanks to the outdoor work he has been doing around the house.  He is working on a low-sodium diet, but eats out at times.  He is being started on PCSK9 and asks about that.  He and his wife are doing a lot of yard work, some heavy.  He has been moving mulch and using a tiller.  He and his wife say they will do work and then rest for a while, then go back to it.    He will get chest pain if he walks up a steep hill or does heavy lifting.  He paces himself, can avoid it, but does get it on a regular basis.  It is the same chest  pain he had prior to his last cath.  It is relieved by rest.   He took nitro in the past, once. It dropped his blood pressure quite a bit, he has not taken it since then.   He has some skin issues, bruises and blackened areas, wonders if it is from the aspirin.  No other bleeding issues.  He was on the Mobic at one time, but has not taken it hardly at all since his last back surgery.  No palpitations, no presyncope or syncope.   Prior CV studies:   The following studies were reviewed today:  ECHO: 06/19/2018 Conclusions Summary Mild to moderate mitral annular calcification. No MR Moderate to severe aortic valve sclerosis. Moderate to severe AS Mild AI  Structurally normal tricuspid valve Trace to mild TR Not well visualized Mild PI Mild left ventricle dilation Normal wall thickness Global function is preserved EF 50-55% Prolonged relaxation Normal wall motion Signature ------------------------------------------------------------------------------  Electronically signed by Mathis Bud, MD, FACC(Interpreting physician) on  07/07/2018 04:41 PM ------------------------------------------------------------------------------ Findings Mitral Valve Mild to moderate mitral annular calcification. No MR Aortic Valve Moderate to severe aortic valve sclerosis. Moderate to severe AS Mild AI Tricuspid Valve Structurally normal tricuspid valve Trace to mild TR Pulmonic Valve Not well visualized Mild PI Left Atrium Moderately dilated left atrium Left Ventricle Mild left ventricle dilation Normal wall thickness Global function is preserved EF 50-55% Prolonged relaxation Normal wall motion Right Atrium Normal right atrium. Right Ventricle Normal right ventricle structure and function. Pericardial Effusion No evidence of pericardial effusion. Pleural Effusion No evidence of pleural effusion. Miscellaneous The aorta is within normal limits. The Pulmonary artery is within normal limits. IVC is normal and collapses M-Mode/2D Measurements & Calculations  LV Diastolic Dimension: LV Systolic Dimension:  LA Dimension: 4.6 cmAO  5.7 cm          4.9 cm          Root Dimension: 3.6 cmLA  LV FS:14.04 %      LV Volume Diastolic:   Area: 95.2 cm2  LV PW Diastolic: 1.1 cm 84.1 ml  Septum Diastolic: 1.1 cm LV Volume Systolic: 32.4  CO: 4.01 l/min      ml  CI: 2.12 l/m*m2     LV EDV/LV EDV Index:              68.5 ml/29 m2LV ESV/LV  LA/Aorta: 0.27  LV Area Diastolic: 25.3 ESV Index: 40.7 ml/17 m2 Ascending Aorta: 3.1 cm  cm2           EF Calculated: 40.58 %  LA  volume/Index: 107 ml  LV Area Systolic: 66.4  EF Estimated: 55 %    /28m2  cm2           LV Length: 8.21 cm              LVOT: 2.2 cm Doppler Measurements & Calculations  MV Peak E-Wave: 70.8  AV Peak Velocity: 364 cm/s LVOT Peak Velocity: 89.4  cm/s          AV Peak Gradient: 53 mmHg cm/s  MV Peak A-Wave: 50.9  AV Mean Velocity: 257 cm/s LVOT Mean Velocity: 54.2  cm/s          AV Mean Gradient: 31 mmHg cm/s  MV E/A Ratio: 1.39   AV VTI: 98.5 cm      LVOT Peak Gradient: 3  MV Peak Gradient: 2.01 AV Area (Continuity):0.91 mmHgLVOT Mean Gradient: 1  mmHg  cm2            mmHg  MV Deceleration Time: LVOT VTI: 23.6 cm  282 msec  MV P1/2t: 83 msec                 TR Velocity:209 cm/s  MVA by PHT2.65 cm2                TR Gradient:17.4724 mmHg  TDI E' Velocity: 5.44  cm/s  CARDIAC CATH: 03/21/2017 Conclusions Diagnostic Summary Chronic Total Occlusion of the Ramus Mild stenosis/ectasia of the small mid Circumflex Moderate stenosis of the LAD & Diagonal Branch These lesions are No change from prior study. No restenosis of the RCA . Normal LV systolic function. LV ejection fraction is 60 % Interventional Summary Unsuccessful PCI of ostial ramus due to failure to cross with guidewire. Interventional Recommendations Medical therapy for CAD Risk factor reduction, Angina Consider more aggressive PCI of ramus Branch if fails medical therapy  I have reviewed the recent history and physical documentation. I personally spent 58 minutes continuously monitoring the patient during the administration of moderate sedation. Pre and post activities have been reviewed. I was present for the entire procedure.  Signatures  Electronically signed by Clarene Critchley, MD,  Angiographic Findings  Cardiac Arteries and Lesion Findings LMCA: Normal appearance with 0% stenosis. LAD:  Lesion on Prox LAD: 40% stenosis 11 mm length . Lesion on 1st Diag: Ostial.40% stenosis 6 mm length . LCx: Lesion on 1st Ob Marg: Devices used - Abbott 0.014" X 190 WhisperES J-Tip PTCI guidewire. Number of passes: 1. - BS 0.014"x182cm ChoicePT Extra Support-J PTCI wire. Number of passes: 1.- ASAHI 0.014" X 190cm FielderR XT PTCI guidewire. Number of passes: 1. Lesion on Mid CX: 20% stenosis 12 mm length . Comments:Ectasia RCA: Lesion on Prox RCA: 15% stenosis 9 mm length . Lesion on Mid RCA: 15% stenosis 7 mm length . Ramus: Lesion on Ramus: Ostial.99% stenosis 4 mm length reduced to 99%. Preprocedure TIMI II flow was noted. Post Procedure TIMI II flow was present. Poor run off was present.   Past Medical History:  Diagnosis Date  . Anxiety   . Arthritis   . Bladder cancer ([place])   . Bladder stone   . BPH (benign prostatic hypertrophy)   . Depression   . GERD (gastroesophageal reflux disease)   . History of bladder cancer    CARCINOMA IN SITU OF BLADDER  . History of kidney stones   . History of myocardial infarction    07-17-2011--   MI W/ OCCLUDED OM1. 2017 occluded RCA treated with a bare-metal stent, high-grade ramus intermediate stenosis in 2018 with failed attempted PCI  . Hyperlipidemia   . Hypertension   . Myocardial infarction [facility]) 2012   cardiac studies on chart  . OSA (obstructive sleep apnea)    cpap non-compliant   . Prostate cancer [facility])    Past Surgical History:  Procedure Laterality Date  . ARTHRODESIS METATARSALPHALANGEAL JOINT (MTPJ) Right 02/20/2018   Procedure: Right Hallux Metatarsal Phalangeal Joint Arthrodesis; Right Silver Bunionectomy;  Surgeon: Wylene Simmer, MD;  Location: [place];  Service: Orthopedics;  Laterality: Right;  . BACK SURGERY    . CARDIAC CATHETERIZATION  07/17/2011   OCCLUDED OM1 WITH UNSUCCESSFUL PCI ATTEMPT, O/W MILD TO MODERATE DISEASE, DR ANDY CHIU ([facility])    .  CARDIOVASCULAR STRESS TEST  04/22/11  . CORONARY ANGIOPLASTY  2017, 2018   BMS RCA 2017, failed PCI RI 2018  .  CYSTO/ BLADDER BX'S  X2  2001  / X1  2003  . CYSTO/ BLADDER BX'S/ BILATERAL RETROGRADE URETEROPYLEGRAM  2003; 2007; 2008;  12-30-2007   CARCINOMA IN SITU OF BLADDER  . CYSTOSCOPY WITH LITHOLAPAXY N/A 05/07/2013   Procedure: CYSTOSCOPY WITH LITHOLAPAXY;  Surgeon: Franchot Gallo, MD;  Location: [facility];  Service: Urology;  Laterality: N/A;  . EHL Lengthening Right 05/10/2017   RT FOOT  . Hammer Toe Repair Right 05/10/2017   Rt #2 Toe  . HAMMERTOE RECONSTRUCTION WITH WEIL OSTEOTOMY Right 02/20/2018   Procedure: Right Second Metatarsal Priscille Heidelberg and Revision Hammertoe Correction;  Surgeon: Wylene Simmer, MD;  Location: [place];  Service: Orthopedics;  Laterality: Right;  . LUMBAR DECOMPRESSION FORAMINOTOMY L3 -- L5/  REMOVAL CYST L4-5 FACET JOINT  08-21-2009   ALSO HAD PRIOR BACK SURG IN 1992  . LUMBAR LAMINECTOMY  04/04/2017   L2-3  . LUMBAR LAMINECTOMY/DECOMPRESSION MICRODISCECTOMY N/A 04/04/2017   Procedure: Laminectomy and Foraminotomy - Lumbar Two-Three;  Surgeon: Eustace Moore, MD;  Location: [place];  Service: Neurosurgery;  Laterality: N/A;  . OPEN TENOTOMY OF FLEXOR 2ND AND 3RD RIGHT TOES  APRIL 2008  . PROSTATE BIOPSY    . TRANSTHORACIC ECHOCARDIOGRAM  07/17/2011   at [facility], MILD TO MODERATE CONCENTRIC LVH/ NORMAL LVSF/ EF 55-60%/ MILD AORTIC STENOSIS  . TRANSURETHRAL RESECTION OF BLADDER TUMOR  01-10-2004  . TRANSURETHRAL RESECTION OF PROSTATE N/A 05/07/2013   Procedure: TRANSURETHRAL RESECTION OF THE PROSTATE WITH GYRUS INSTRUMENTS;  Surgeon: Franchot Gallo, MD;  Location: [facility];  Service: Urology;  Laterality: N/A;     Current Meds  Medication Sig  . Alirocumab 75 MG/ML SOAJ Inject 1 mL into the skin every 14 (fourteen) days.  . ALPRAZolam (XANAX) 0.5 MG tablet Take 0.25 mg by mouth at bedtime as needed for anxiety.  Marland Kitchen aspirin EC 81 MG tablet  Take 1 tablet (81 mg total) by mouth 2 (two) times daily. (Patient taking differently: Take 81 mg by mouth daily. )  . buPROPion (WELLBUTRIN XL) 300 MG 24 hr tablet Take 300 mg by mouth daily.  . carvedilol (COREG) 6.25 MG tablet Take 6.25 mg by mouth 2 (two) times daily with a meal.  . famotidine (PEPCID) 20 MG tablet Take 1 tablet by mouth 2 (two) times daily as needed.  . hydrochlorothiazide (MICROZIDE) 12.5 MG capsule Take 1 capsule (12.5 mg total) by mouth daily.  . meloxicam (MOBIC) 7.5 MG tablet Take 1 tablet by mouth daily as needed.  . Multiple Vitamin (MULTIVITAMIN WITH MINERALS) TABS tablet Take 1 tablet by mouth daily.  . nitroGLYCERIN (NITROSTAT) 0.4 MG SL tablet Place 0.4 mg under the tongue every 5 (five) minutes as needed for chest pain.   . traMADol (ULTRAM) 50 MG tablet Take 1 tablet by mouth daily as needed.  . valACYclovir (VALTREX) 1000 MG tablet Take 1,000 mg by mouth 3 (three) times daily as needed (fever blisters).      Allergies:   Statins; Augmentin [amoxicillin-pot clavulanate]; Dexamethasone; and Sulfa antibiotics   Social History   Tobacco Use  . Smoking status: Former Smoker    Packs/day: 0.50    Years: 50.00    Pack years: 25.00    Types: Cigarettes    Last attempt to quit: 04/15/2010    Years since quitting: 8.8  . Smokeless tobacco: Current User    Types: Snuff  . Tobacco comment: occasional  snuff pack  Substance Use Topics  . Alcohol use: Yes  Alcohol/week: 2.0 standard drinks    Types: 2 Cans of beer per week    Comment: social  . Drug use: No     Family Hx: The patient's family history includes Cancer in his father and mother.  ROS:   Please see the history of present illness.    All other systems reviewed and are negative.   Labs/Other Tests and Data Reviewed:    Recent Labs: No results found for requested labs within last 8760 hours.   Recent Lipid Panel No results found for: CHOL, TRIG, HDL, CHOLHDL, LDLCALC, LDLDIRECT  Wt  Readings from Last 3 Encounters:  02/19/19 230 lb (104.3 kg)  02/20/18 229 lb (103.9 kg)  02/05/18 236 lb (107 kg)     Objective:    Vital Signs:  BP 133/66   Pulse (!) 59   Ht 6\' 4"  (1.93 m)   Wt 230 lb (104.3 kg)   BMI 28.00 kg/m    Well nourished, well developed 78 y.o. male in no acute distress. He was able to speak well in complete sentences, no obvious dyspnea.  He showed me his arms which did have multiple areas of ecchymosis, very stages of healing.  He showed me his legs and feet, the right was slightly larger than the left, but neither had any obvious edema. Skin color is within normal limits.  ASSESSMENT & PLAN:    1.  CAD, with angina:  - He was given Imdur at one point in the past.  The other day, when he was having episodes of chest pain with exertion, he took 1.  It is old but he felt like it helped. -Upon reviewing his last cath report from 2018, he has a 99% ramus intermedius that Dr. Marijo File was unable to cross with a wire>> medical therapy - Continue aspirin 81 mg a day, carvedilol 6.25 mg twice daily, sublingual nitroglycerin as needed -Add Imdur 30 mg daily, renew sublingual nitroglycerin - I explained that because of COVID-19, we are trying not to do elective caths. - If the Imdur (possibly with titration) controls his symptoms, no further work-up is indicated.  However, if his symptoms are not controlled by Imdur then he needs to contact us and let us know this so that we can schedule him for a cardiac catheterization. -If his symptoms worsen or if he develops resting pain he is aware he needs to come to the emergency room  2.  Hyperlipidemia, goal LDL less than 70 - I spoke strongly in favor of injectable lipid-lowering medications, explaining that because they do not go through the gut, many side effects are avoided -Dr. Marijo File is having to change him from [place] to Praluent, that process is ongoing  3.  Hypertension: - He is on the carvedilol 6.25 mg  twice daily and a low-dose of HCTZ, 12.5 mg daily. - The HCTZ was started recently, I explained that seems to be helping his lower extremity edema and his blood pressure. - Since he is on a diuretic, I encouraged him to eat something with potassium in it daily  COVID-19 Education: The signs and symptoms of COVID-19 were discussed with the patient and how to seek care for testing (follow up with PCP or arrange E-visit).  The importance of social distancing was discussed today.  Patient Risk:   After full review of this patient's clinical status, I feel that they are at least moderate risk at this time.  Time:   Today, I have spent 38 minutes with  the patient with telehealth technology discussing cardiac issues and cardiac risk factor reduction.     Medication Adjustments/Labs and Tests Ordered: Current medicines are reviewed at length with the patient today.  Concerns regarding medicines are outlined above.  Tests Ordered: No orders of the defined types were placed in this encounter.  Medication Changes: No orders of the defined types were placed in this encounter.   Disposition:  Follow up With Dr. Percival Spanish (he prefers Dr. Percival Spanish to going back to the cardiologist at [facility] since Dr. Sherrian Divers is no longer there)  Signed, Rosaria Ferries, PA-C  02/19/2019 10:59 AM    [place]

## 2019-02-18 NOTE — Telephone Encounter (Signed)
°  Pt c/o swelling: STAT is pt has developed SOB within 24 hours  1) How much weight have you gained and in what time span? Has not gained weight  2) If swelling, where is the swelling located? Feet and ankles  3) Are you currently taking a fluid pill? no  4) Are you currently SOB? Only when working in yard  5) Do you have a log of your daily weights (if so, list)?no  6) Have you gained 3 pounds in a day or 5 pounds in a week? no   7) Have you traveled recently? No Patient called stating his feet are swelling at night after he has been working in the yard at night.  He checked his BP in the morning 111/59, after working in the yard 155/76.  He said he did have a little chest pain after working in the yard and he rested and it went away.  This started about a few days ago.

## 2019-02-18 NOTE — Telephone Encounter (Signed)
YOUR CARDIOLOGY TEAM HAS ARRANGED FOR AN E-VISIT FOR YOUR APPOINTMENT - PLEASE REVIEW IMPORTANT INFORMATION BELOW SEVERAL DAYS PRIOR TO YOUR APPOINTMENT  Due to the recent COVID-19 pandemic, we are transitioning in-person office visits to tele-medicine visits in an effort to decrease unnecessary exposure to our patients and staff. Medicare and most insurances are covering these visits without a copay needed. We also encourage you to sign up for MyChart if you have not already done so. You will need a smartphone if possible. For patients that do not have this, we can still complete the visit using a regular telephone but do prefer a smartphone to enable video when possible. You may have a close family member that lives with you that can help. If possible, we also ask that you have a blood pressure cuff and scale at home to measure your blood pressure, heart rate and weight prior to your scheduled appointment. Patients with clinical needs that need an in-person evaluation and testing will still be able to come to the office if absolutely necessary. If you have any questions, feel free to call our office.   THE DAY OF YOUR APPOINTMENT  Approximately 15 minutes prior to your scheduled appointment, you will receive a telephone call from one of [facility] team - your caller ID may say "Unknown caller."  Our staff will confirm medications, vital signs for the day and any symptoms you may be experiencing. Please have this information available prior to the time of visit start. It may also be helpful for you to have a pad of paper and pen handy for any instructions given during your visit. They will also walk you through joining the WebEx smartphone meeting if this is a video visit.    CONSENT FOR TELE-HEALTH VISIT - PLEASE REVIEW  I hereby voluntarily request, consent and authorize [facility] and its employed or contracted physicians, physician assistants, nurse practitioners or other licensed health care  professionals (the Practitioner), to provide me with telemedicine health care services (the "Services") as deemed necessary by the treating Practitioner. I acknowledge and consent to receive the Services by the Practitioner via telemedicine. I understand that the telemedicine visit will involve communicating with the Practitioner through live audiovisual communication technology and the disclosure of certain medical information by electronic transmission. I acknowledge that I have been given the opportunity to request an in-person assessment or other available alternative prior to the telemedicine visit and am voluntarily participating in the telemedicine visit.  I understand that I have the right to withhold or withdraw my consent to the use of telemedicine in the course of my care at any time, without affecting my right to future care or treatment, and that the Practitioner or I may terminate the telemedicine visit at any time. I understand that I have the right to inspect all information obtained and/or recorded in the course of the telemedicine visit and may receive copies of available information for a reasonable fee.  I understand that some of the potential risks of receiving the Services via telemedicine include:  Marland Kitchen Delay or interruption in medical evaluation due to technological equipment failure or disruption; . Information transmitted may not be sufficient (e.g. poor resolution of images) to allow for appropriate medical decision making by the Practitioner; and/or  . In rare instances, security protocols could fail, causing a breach of personal health information.  Furthermore, I acknowledge that it is my responsibility to provide information about my medical history, conditions and care that is complete and accurate  to the best of my ability. I acknowledge that Practitioner's advice, recommendations, and/or decision may be based on factors not within their control, such as incomplete or inaccurate  data provided by me or distortions of diagnostic images or specimens that may result from electronic transmissions. I understand that the practice of medicine is not an exact science and that Practitioner makes no warranties or guarantees regarding treatment outcomes. I acknowledge that I will receive a copy of this consent concurrently upon execution via email to the email address I last provided but may also request a printed copy by calling the office of [place].    I understand that my insurance will be billed for this visit.   I have read or had this consent read to me. . I understand the contents of this consent, which adequately explains the benefits and risks of the Services being provided via telemedicine.  . I have been provided ample opportunity to ask questions regarding this consent and the Services and have had my questions answered to my satisfaction. . I give my informed consent for the services to be provided through the use of telemedicine in my medical care  By participating in this telemedicine visit I agree to the above.

## 2019-02-19 ENCOUNTER — Telehealth (INDEPENDENT_AMBULATORY_CARE_PROVIDER_SITE_OTHER): Payer: Medicare HMO | Admitting: Physician Assistant

## 2019-02-19 ENCOUNTER — Encounter: Payer: Self-pay | Admitting: Physician Assistant

## 2019-02-19 VITALS — BP 133/66 | HR 59 | Ht 76.0 in | Wt 230.0 lb

## 2019-02-19 DIAGNOSIS — E785 Hyperlipidemia, unspecified: Secondary | ICD-10-CM

## 2019-02-19 DIAGNOSIS — I25119 Atherosclerotic heart disease of native coronary artery with unspecified angina pectoris: Secondary | ICD-10-CM | POA: Diagnosis not present

## 2019-02-19 DIAGNOSIS — I10 Essential (primary) hypertension: Secondary | ICD-10-CM | POA: Diagnosis not present

## 2019-02-19 DIAGNOSIS — Z7982 Long term (current) use of aspirin: Secondary | ICD-10-CM

## 2019-02-19 DIAGNOSIS — Z79899 Other long term (current) drug therapy: Secondary | ICD-10-CM

## 2019-02-19 MED ORDER — ISOSORBIDE MONONITRATE ER 30 MG PO TB24: 30.0000 mg | ORAL_TABLET | Freq: Every day | ORAL | 3 refills | Status: DC

## 2019-02-19 MED ORDER — NITROGLYCERIN 0.4 MG SL SUBL: 0.4000 mg | SUBLINGUAL_TABLET | SUBLINGUAL | 3 refills | Status: DC | PRN

## 2019-02-19 NOTE — Patient Instructions (Signed)
Medication Instructions:  Rosaria Ferries, PA-C has recommended making the following medication changes: 1. START Isosorbide (Imdur) 30 mg daily  If you need a refill on your cardiac medications before your next appointment, please call your pharmacy.   Follow-Up: Your physician recommends that you schedule a follow-up appointment in: July with Dr Percival Spanish

## 2019-02-24 NOTE — Telephone Encounter (Signed)
Pt had a telehealth visit with Rosaria Ferries, PA on 02/19/19.

## 2019-02-26 ENCOUNTER — Telehealth: Payer: Self-pay | Admitting: Cardiology

## 2019-02-26 NOTE — Telephone Encounter (Signed)
Returned call to patient.  His evening blood pressure readings are trending about 20-30 points higher than morning readings.  Takes am meds 8-10 am and evening meds 9-10 pm.   Has noted mild headaches in the evenings that he associates with the elevated pressures.  Per patient home cuff is an arm cuff, [employer] brand, around 18-78 years old.  (most of those were made by [employer] and are quite accurate).   He had a virtual visit with Rosaria Ferries PA on April 9 and she took him off amlodipine because of ongoing problems with lower extremity edema.  She started him on hctz 12.5 mg once daily, as well as isosorbide mono 30 mg qd.  Today he notes that the chest pain has eased up, as has the lower extremity edema.   When asked about the meloxicam, he notes that it is only prn use, and he may use it 3 days in one week then not again for 2-3 weeks.  Does not use daily.    I asked that he increase the hctz from 12.5 mg once daily to twice daily, with the second dose to be around dinner time.  He will continue to monitor twice daily BP readings and call back in 10 days with those numbers.  If he has better evening control, would probably suggest that we switch him to chlorthalidone, which has a much longer half life, to give control around the clock.    Patient agreeable to increasing hctz and continuing to monitor BP readings.  Will call back as asked.

## 2019-02-26 NOTE — Telephone Encounter (Signed)
Follow Up:    Pt said  On 02-19-19 PA stopped his Amlodipine and started him on Hydrochlorothiazide. His blood pressure is still running high. In the evenings it runs up to 155/76, it was 160/75 last night. It runs up high at night. Also been having slight headaches.

## 2019-02-26 NOTE — Telephone Encounter (Signed)
Follow Up:    Pt calling to find out if his medicine needs to be adjusted, since blood pressure been going up.

## 2019-02-26 NOTE — Telephone Encounter (Signed)
Called patient back, he states that he has noticed that in the morning his BP is good- normally 118/70's, but then throughout the day and into the evenings it begins to creep up. Last nights reading was 160/75, and the night before 155/76. He takes his HCTZ in the mornings, and he has noticed that he gets headaches when his BP starts to go up. He denies CP, and only has SOB when doing strenuous activities. Patient would like to know if any adjustments should be made to his medications.  Thank you!

## 2019-02-26 NOTE — Telephone Encounter (Signed)
Follow up:   Patient returning call back. Please call patient.

## 2019-02-26 NOTE — Telephone Encounter (Signed)
Called patient, LVM advising patient to call back to discuss.  Left call back number.

## 2019-02-26 NOTE — Telephone Encounter (Signed)
Thanks

## 2019-03-03 ENCOUNTER — Telehealth: Payer: Self-pay | Admitting: Cardiology

## 2019-03-03 ENCOUNTER — Other Ambulatory Visit: Payer: Self-pay | Admitting: Cardiology

## 2019-03-03 MED ORDER — LOSARTAN POTASSIUM 25 MG PO TABS: 25.0000 mg | ORAL_TABLET | Freq: Every day | ORAL | 1 refills | Status: DC

## 2019-03-03 NOTE — Telephone Encounter (Signed)
New Message             Patient is need an alternative to "Losartin", his pharmacy is out and not sure when they will get it Pls call to advise. The pharmacy is [facility]  On [address].

## 2019-03-03 NOTE — Telephone Encounter (Signed)
New Message     Pt c/o medication issue:  1. Name of Medication: Hydrochlorothiazide   2. How are you currently taking this medication (dosage and times per day)? 12.5 mg 1 in morning and 1 in the evening   3. Are you having a reaction (difficulty breathing--STAT)? No   4. What is your medication issue? Pt started this medication and stopped taking amlodipine and he says his BP is still high. BP 162/84 HR 58  Pt says his weight is staying about the same

## 2019-03-03 NOTE — Telephone Encounter (Signed)
*  [place] follow up*  Recommendation:  1. Decrease HCTZ 12.5mg  back to 1 capsule per day (mid-day)  2. Continue carvedilol 6.25mg  twice daily  3. Continue Imdur 30mg  daily  4. Start taking losartan 25mg  daily (every morning)  5. Continue to monitor BP twice daily and call back in 1 week for follow up.

## 2019-03-03 NOTE — Telephone Encounter (Signed)
Losartan has already been sent to [facility] today by Dr Percival Spanish.

## 2019-03-03 NOTE — Telephone Encounter (Signed)
New Message    *STAT* If patient is at the pharmacy, call can be transferred to refill team.   1. Which medications need to be refilled? (please list name of each medication and dose if known) Losartan 25 mg 1 tablet once a day  2. Which pharmacy/location (including street and city if local pharmacy) is medication to be sent to? [place] [address] [place]   3. Do they need a 30 day or 90 day supply? 30 day supply

## 2019-03-03 NOTE — Telephone Encounter (Signed)
Called pharmacy, advised patient to see if another pharmacy had the medication instead of switching the med, he gave [facility]. Med was sent there, advised patient to call if unable to get from them.

## 2019-03-03 NOTE — Telephone Encounter (Signed)
Called patient, he states that his BP is still elevated.   17th- 157/70 am 153/81 pm  18th- 152/62 am 156/81 pm  20th- 134/60 am 167/79 pm  21st- this am- 162/84  Patient also states no change in weight.  Will route to PharmD to assess as asked.

## 2019-04-21 ENCOUNTER — Ambulatory Visit: Payer: Self-pay | Admitting: Cardiology

## 2019-04-22 ENCOUNTER — Telehealth: Payer: Self-pay

## 2019-04-22 ENCOUNTER — Other Ambulatory Visit: Payer: Self-pay

## 2019-04-22 ENCOUNTER — Ambulatory Visit: Payer: Medicare HMO | Admitting: Cardiology

## 2019-04-22 ENCOUNTER — Encounter: Payer: Self-pay | Admitting: Cardiology

## 2019-04-22 VITALS — BP 130/72 | HR 72 | Temp 97.7°F | Ht 76.0 in | Wt 242.0 lb

## 2019-04-22 DIAGNOSIS — I10 Essential (primary) hypertension: Secondary | ICD-10-CM | POA: Diagnosis not present

## 2019-04-22 DIAGNOSIS — I209 Angina pectoris, unspecified: Secondary | ICD-10-CM | POA: Diagnosis not present

## 2019-04-22 DIAGNOSIS — E785 Hyperlipidemia, unspecified: Secondary | ICD-10-CM

## 2019-04-22 DIAGNOSIS — I25119 Atherosclerotic heart disease of native coronary artery with unspecified angina pectoris: Secondary | ICD-10-CM | POA: Diagnosis not present

## 2019-04-22 DIAGNOSIS — I2510 Atherosclerotic heart disease of native coronary artery without angina pectoris: Secondary | ICD-10-CM | POA: Insufficient documentation

## 2019-04-22 MED ORDER — ISOSORBIDE MONONITRATE ER 60 MG PO TB24: 60.0000 mg | ORAL_TABLET | Freq: Every day | ORAL | 3 refills | Status: DC

## 2019-04-22 NOTE — Assessment & Plan Note (Signed)
Patient started on Repatha.  Last LDL that have seen checked was in 2017.  Will need to get labs from PCP.

## 2019-04-22 NOTE — Assessment & Plan Note (Addendum)
Somewhat confusing circumstances, but it sounds like his angina had been well controlled on Imdur, but his prior cardiologist told him to stop it several months back.  He now presents with recurrent exertional chest pain that is probably class II in nature, but he is already on carvedilol and Imdur with minimal relief. We talked about invasive versus noninvasive evaluation options.  I just do not think that a noninvasive option will be all that helpful because he still has symptoms.  This is similar to his prior angina symptoms, and your family get back into his exercise regimen he would need confirmatory data. I think a nuclear stress test will probably be abnormal with his occluded ramus/OM branch. If there is no change in anatomy, then we can potentially consider CTO PCI of the ramus/OM.  For now I will have him continue taking Imdur, but increase dose to 60 mg and continue beta-blocker. -->  Next option if medical management is necessary would be to titrate beta-blocker and consider Ranexa.  Plan: Left heart catheterization with coronary angiography and possible PCI for recurrent class II angina on optimal medical management.

## 2019-04-22 NOTE — Patient Instructions (Signed)
Medication Instructions:  INCREASE Imdur to 60mg  Take 1 tablet once a day  If you need a refill on your cardiac medications before your next appointment, please call your pharmacy.   Lab work: Your physician recommends that you return for lab work in: TODAY-BMET, CBC If you have labs (blood work) drawn today and your tests are completely normal, you will receive your results only by:  MyChart Message (if you have MyChart) OR  A paper copy in the mail If you have any lab test that is abnormal or we need to change your treatment, we will call you to review the results.  Testing/Procedures: Your physician has requested that you have a cardiac catheterization. Cardiac catheterization is used to diagnose and/or treat various heart conditions. Doctors may recommend this procedure for a number of different reasons. The most common reason is to evaluate chest pain. Chest pain can be a symptom of coronary artery disease (CAD), and cardiac catheterization can show whether plaque is narrowing or blocking your hearts arteries. This procedure is also used to evaluate the valves, as well as measure the blood flow and oxygen levels in different parts of your heart. For further information please visit [email or web address]. Please follow instruction sheet, as given. SEE BELOW FOR INSTRUCTIONS    Follow-Up: At [facility], you and your health needs are our priority.  As part of our continuing mission to provide you with exceptional heart care, we have created designated Provider Care Teams.  These Care Teams include your primary Cardiologist (physician) and Advanced Practice Providers (APPs -  Physician Assistants and Nurse Practitioners) who all work together to provide you with the care you need, when you need it. You will need a follow up appointment in 2-3 weeks AFTER CATH.  Please call our office 2 months in advance to schedule this appointment.  You may see Minus Breeding, MD or one of the following  Advanced Practice Providers on your designated Care Team:   Rosaria Ferries, PA-C  Jory Sims, DNP, ANP  Any Other Special Instructions Will Be Listed Below (If Applicable).         [place] [address] [place] [address] Dept: [phone] Loc: [place]  04/22/2019  You are scheduled for a Cardiac Catheterization on Friday, June 19 with Dr. Larae Grooms.  1. Please arrive at the [facility] (Main Entrance A) at [facility]: [address] [place], [place] [address] at 8:30 AM (This time is two hours before your procedure to ensure your preparation). Free valet parking service is available.   Special note: Every effort is made to have your procedure done on time. Please understand that emergencies sometimes delay scheduled procedures.  2. Diet: Do not eat solid foods after midnight.  The patient may have clear liquids until 5am upon the day of the procedure.  3. Labs: You will need to have blood drawn on Wednesday, June 10 at [facility]  Open: 8am - 5pm (Lunch 12:30 - 1:30)   Phone: [phone]. You do not need to be fasting.  4. Medication instructions in preparation for your procedure:   Contrast Allergy: No  On the morning of your procedure, take your Aspirin and any morning medicines NOT listed above.  You may use sips of water.  5. Plan for one night stay--bring personal belongings. 6. Bring a current list of your medications and current insurance cards.  7. You MUST have a responsible person to drive you home. 8. Someone MUST be with you the first 24 hours after you arrive home or your discharge will be delayed. 9. Please wear clothes that are easy to get on and off and wear slip-on shoes.  Thank you for allowing [place] to care for you!   -- [facility]

## 2019-04-22 NOTE — Assessment & Plan Note (Signed)
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  I am not sure how much of his symptoms we would be related to the basically subacute CTO of the RI/OM.  He felt better despite unsuccessful attempted PCI of the RI/OM branch and the symptoms are pretty well controlled but now recurrent. This would lead me to believe that there is possible progression of disease in his other coronary arteries.  He is on aspirin without Plavix (or other Thienopyridine antiplatelet agent), we are increasing Imdur to 60 mg and continuing current dose of losartan and carvedilol. He was started on PCSK9 inhibitor for lipids.  Planning left heart catheterization with native coronary angiography and possible PCI to evaluate for recurrent angina.

## 2019-04-22 NOTE — Telephone Encounter (Signed)
Left detailed message on patients cell phone letting him know that his wife can not stay with him at the hospital. His wife may drop him off and pick him up, but right now no visitors are allowed at this current time. Advised patient to contact the oiffice with any questuions or concerns.

## 2019-04-22 NOTE — Assessment & Plan Note (Signed)
Blood pressure improved well-controlled on carvedilol and losartan.  He does have heart rate we will decrease carvedilol if additional antianginal treatment is warranted.  Otherwise stable.

## 2019-04-22 NOTE — Progress Notes (Signed)
PCP: Raina Mina., MD  Cardiologist: Dr. Percival Spanish (he prefers Dr. Percival Spanish to going back to the cardiologist at [facility] since Dr. Wyline Copas is no longer there) Rosaria Ferries, PA  Clinic Note: Chief Complaint  Patient presents with   Follow-up    CAD   Chest Pain    Persistent  with Imdur    HPI: Anthony Hartman is a 78 y.o. male with a PMH most noted for CAD-PCI GERD risk factors hypertension, Diabetes mellitus type 2, hyperlipidemia and OSA on CPAP below who presents today for reevaluation of exertional chest discomfort.  CAD w/ 100% OM(RI) 2012 and 2017 BMS x2 RCA, 2018 RI(OM) failed PCI, DM2, HTN, mild carotid dz, HLD intol statins>> recently initiated on PCSK9, OSA on CPAP, spinal stenosis s/p surgery,  depression, GERD, bladder CA  [name] was last seen on April 9 via virtual visit by Rosaria Ferries, PA.  He was noticing worsening lower extremity edema worse at night.  Gets better when he puts it up at night.  Has multiple admissions.  Not wearing support stockings.  No orthopnea PND.  Low improved exertional dyspnea. He noted chest pain walking up steep hill or heavy lifting, not noted to pace himself.  Similar to previous anginal equivalent.  Relieved with rest.  Nitroglycerin tends to lower his pressure so does not use it. --Added Imdur and continued aspirin and carvedilol. --Plan was to consider cath if symptoms not relieved.  Recent Hospitalizations:   None  Studies Personally Reviewed - (if available, images/films reviewed: From Epic Chart or Care Everywhere)  2012 Cath - subtotal CTO of OM/I   Cardiac Cath 03/21/2017 (Dr. Chuck Hint - [place]): OM(RI) ~ CTO (unfixable PCI unable to cross with wire/balloon. pLAD 40%, ost D1 40%;  prox-mid RCA 15%.  Mild stenosis with small mid LCx.  EF 60%.  Acute Inferior STEMI- Cardiac Cath -- November 2017: Mid LAD 30%, D2 50%.  OM 1/RI 100%, mid circumflex 30%.  Acute mid RCA 100% --> Integrity BMS 3.5 mm x 81m (3.8  mm) & 3.5 mm x 12 mm (4.1 mm) --BMS used because of need for back surgery.  EF 50% with inferobasal hypokinesis.  Echocardiogram June 19, 2018 ([facility]: Normal LV function/thickness.  EF 50 to 55%.  Normal wall motion.  Mild to moderate MAC.  Moderate to severe aortic stenosis (calcified).  Mild aortic insufficiency.  Interval History: EBillyjackreturns here today to discuss his symptoms.  He said that he is still noticing having a tightness and dyspnea sensation doing edema when walking on flat ground.  Trying to carry something or walks about incline, he will notice of prolonged chest discomfort there is nitroglycerin responsive.  The Imdur has helped some.  But he indicated that he has been on Imdur in the past after the failed PCI back back a year ago.  Unfortunately his cardiologist been told that he did not need to be on it so he has been off of it for a few months.  He subsequently developed now progressive exertional dyspnea and chest tightness.  At this point he is very scared even doing things like low back pain golf or his routine exercise.  He is concerned that every time he is done more, has had more symptoms.  He now is noticing discomfort and dyspnea walking uphill/incline or upstairs, but not. No resting dyspnea.  Really leading up to this he was very active, exercise.  Now that he has been  having this exertional dyspnea and chest discomfort he has been reluctant to do that.  He has been doing a lot of heavy exertional your yard work which can also cause symptoms.  He does note with either the yard work or even a walking uphill, if he paces himself and goes slow, he feels better.  He does say that since starting back on the Imdur symptoms may have improved a little bit but they are still persistent and he still quite concerned.  No PND, orthopnea with end of day edema (Left> Right ankle edema). Uses CPAP  No palpitations, lightheadedness, weakness or syncope/near syncope. --  Occasional balance issues . Dizziness since Neck Sgx.  No TIA/amaurosis fugax symptoms. No melena, hematuria, or epstaxis.  Easy bruising; + blood in stool associated with constipation. No claudication.  ROS: A comprehensive was performed. Review of Systems  Constitutional: Positive for malaise/fatigue. Negative for weight loss.  HENT: Negative for nosebleeds.   Respiratory: Negative for sputum production and shortness of breath (Only with exertion).   Cardiovascular: Positive for leg swelling (With hemostasis changes). Negative for claudication.  Gastrointestinal: Negative for blood in stool and melena.  Genitourinary: Positive for frequency (5 times a night nocturia). Negative for hematuria.  Musculoskeletal: Negative for myalgias.  Neurological: Positive for dizziness (Only he stands up too fast), tingling (Occasionally notes Legs tingling - while sitting - better with elevation) and headaches (Mild headache associated with Imdur.).  Psychiatric/Behavioral: Negative for depression (Maybe mildly dysthymic). The patient is not nervous/anxious and does not have insomnia.   All other systems reviewed and are negative.   The patient does not have symptoms concerning for COVID-19 infection (fever, chills, cough, or new shortness of breath).  The patient is practicing social distancing.   COVID-19 Education: The signs and symptoms of COVID-19 were discussed with the patient and how to seek care for testing (follow up with PCP or arrange E-visit).   The importance of social distancing was discussed today.   I have reviewed and (if needed) personally updated the patient's problem list, medications, allergies, past medical and surgical history, social and family history.   Past Medical History:  Diagnosis Date   Anxiety    Arthritis    Bladder cancer ([place])    Bladder stone    BPH (benign prostatic hypertrophy)    Depression    GERD (gastroesophageal reflux disease)    History  of bladder cancer    CARCINOMA IN SITU OF BLADDER   History of kidney stones    History of myocardial infarction    07-17-2011--   MI W/ OCCLUDED OM1. 2017 occluded RCA treated with a bare-metal stent, high-grade ramus intermediate stenosis in 2018 with failed attempted PCI   Hyperlipidemia    Hypertension    Myocardial infarction [facility]) 2012; 2017   2012 - noted Subtotal CTO of OM/RI;  09/2016 - Acute Infer STEMI - BMS x 2 mRCA (3.5 mm x 15 & 3.5 x 12 - tapered 4.0 - 3.8 mm)   OSA (obstructive sleep apnea)    cpap non-compliant    Prostate cancer [facility])     Past Surgical History:  Procedure Laterality Date   ARTHRODESIS METATARSALPHALANGEAL JOINT (MTPJ) Right 02/20/2018   Procedure: Right Hallux Metatarsal Phalangeal Joint Arthrodesis; Right Silver Bunionectomy;  Surgeon: Wylene Simmer, MD;  Location: [place];  Service: Orthopedics;  Laterality: Right;   BACK SURGERY     CARDIOVASCULAR STRESS TEST  04/22/11   CORONARY ANGIOPLASTY  03/2017  failed PCI RI (OM1) 2018 -- similar to 2012.   CORONARY STENT INTERVENTION  09/2016   [facility] (Acute Inferior STEMI)- Acute mid RCA 100% --> Integrity BMS 3.5 mm x 31m (3.8 mm) & 3.5 mm x 12 mm (4.1 mm) --BMS used because of need for back surgery   CYSTO/ BLADDER BX'S  X2  2001  / X1  2003   CYSTO/ BLADDER BX'S/ BILATERAL RETROGRADE URETEROPYLEGRAM  2003; 2007; 2008;  12-30-2007   CARCINOMA IN SITU OF BLADDER   CYSTOSCOPY WITH LITHOLAPAXY N/A 05/07/2013   Procedure: CYSTOSCOPY WITH LITHOLAPAXY;  Surgeon: SFranchot Gallo MD;  Location: [facility];  Service: Urology;  Laterality: N/A;   EHL Lengthening Right 05/10/2017   RT FOOT   Hammer Toe Repair Right 05/10/2017   Rt #2 Toe   HAMMERTOE RECONSTRUCTION WITH WEIL OSTEOTOMY Right 02/20/2018   Procedure: Right Second Metatarsal WPriscille Heidelbergand Revision Hammertoe Correction;  Surgeon: HWylene Simmer MD;  [place] [facility]  Service:  Orthopedics;  Laterality: Right;   LEFT HEART CATH AND CORONARY ANGIOGRAPHY  07/17/2011   OCCLUDED OM1 WITH UNSUCCESSFUL PCI ATTEMPT, O/W MILD TO MODERATE DISEASE, DR ANDY CHIU ([facility])     LEFT HEART CATH AND CORONARY ANGIOGRAPHY  09/2016   Acute Inferior STEMI ([facility])  - Mid LAD 30%, D2 50%.  OM 1/RI 100%, mid circumflex 30%.  Acute mRCA 100% (thrombotic) - BMS PCI.  EF 50% with inferobasal hypokinesis.   LEFT HEART CATH AND CORONARY ANGIOGRAPHY  03/2017   (Dr. RChuck Hint- [place]): OM(RI) ~ CTO (unfixable PCI unable to cross with wire/balloon. pLAD 40%, ost D1 40%;  prox-mid RCA 15%.  Mild stenosis with small mid LCx.  EF 60%.   LUMBAR DECOMPRESSION FORAMINOTOMY L3 -- L5/  REMOVAL CYST L4-5 FACET JOINT  08-21-2009   ALSO HAD PRIOR BACK SURG IN 1992   LUMBAR LAMINECTOMY  04/04/2017   L2-3   LUMBAR LAMINECTOMY/DECOMPRESSION MICRODISCECTOMY N/A 04/04/2017   Procedure: Laminectomy and Foraminotomy - Lumbar Two-Three;  Surgeon: JEustace Moore MD;  [place] [facility]  Service: Neurosurgery;  Laterality: N/A;   OPEN TENOTOMY OF FLEXOR 2ND AND 3RD RIGHT TOES  APRIL 2008   PROSTATE BIOPSY     TRANSTHORACIC ECHOCARDIOGRAM  07/17/2011   at [facility], MILD TO MODERATE CONCENTRIC LVH/ NORMAL LVSF/ EF 55-60%/ MILD AORTIC STENOSIS   TRANSTHORACIC ECHOCARDIOGRAM  06/2018   ([place]: Normal LV function/thickness.  EF 50 to 55%.  Normal wall motion.  Mild to moderate MAC.  Moderate to Severe Aortic Stenosis (calcified).  Mild aortic insufficiency.   TRANSURETHRAL RESECTION OF BLADDER TUMOR  01-10-2004   TRANSURETHRAL RESECTION OF PROSTATE N/A 05/07/2013   Procedure: TRANSURETHRAL RESECTION OF THE PROSTATE WITH GYRUS INSTRUMENTS;  Surgeon: SFranchot Gallo MD;  [facility];  Service: Urology;  Laterality: N/A;    Current Meds  Medication Sig   Alirocumab 75 MG/ML SOAJ Inject 1 mL into the skin every 14 (fourteen) days.   ALPRAZolam (XANAX) 0.5 MG  tablet Take 0.25 mg by mouth at bedtime as needed for anxiety.   aspirin EC 81 MG tablet Take 1 tablet (81 mg total) by mouth 2 (two) times daily. (Patient taking differently: Take 81 mg by mouth daily. )   buPROPion (WELLBUTRIN XL) 300 MG 24 hr tablet Take 300 mg by mouth daily.   carvedilol (COREG) 6.25 MG tablet Take 6.25 mg by mouth 2 (two) times daily with a meal.  famotidine (PEPCID) 20 MG tablet Take 1 tablet by mouth 2 (two) times daily as needed.   losartan (COZAAR) 25 MG tablet Take 1 tablet (25 mg total) by mouth daily.   Multiple Vitamin (MULTIVITAMIN WITH MINERALS) TABS tablet Take 1 tablet by mouth daily.   nitroGLYCERIN (NITROSTAT) 0.4 MG SL tablet Place 1 tablet (0.4 mg total) under the tongue every 5 (five) minutes as needed for chest pain.   traMADol (ULTRAM) 50 MG tablet Take 1 tablet by mouth daily as needed.   valACYclovir (VALTREX) 1000 MG tablet Take 1,000 mg by mouth 3 (three) times daily as needed (fever blisters).    [DISCONTINUED] isosorbide mononitrate (IMDUR) 30 MG 24 hr tablet Take 1 tablet (30 mg total) by mouth daily.    Allergies  Allergen Reactions   Statins     MD ORDERS UNSPECIFIED REACTION     Augmentin [Amoxicillin-Pot Clavulanate] Nausea And Vomiting     Has patient had a PCN reaction causing immediate rash, facial/tongue/throat swelling, SOB or lightheadedness with hypotension: No Has patient had a PCN reaction causing severe rash involving mucus membranes or skin necrosis: No Has patient had a PCN reaction that required hospitalization No Has patient had a PCN reaction occurring within the last 10 years: No If all of the above answers are "NO", then may proceed with Cephalosporin use.    Dexamethasone Other (See Comments)    Frequent Urination [? HYPERGLYCEMIA? ]   Sulfa Antibiotics Nausea And Vomiting    Social History   Tobacco Use   Smoking status: Former Smoker    Packs/day: 0.50    Years: 50.00    Pack years: 25.00     Types: Cigarettes    Last attempt to quit: 04/15/2010    Years since quitting: 9.0   Smokeless tobacco: Current User    Types: Snuff   Tobacco comment: occasional  snuff pack  Substance Use Topics   Alcohol use: Yes    Alcohol/week: 2.0 standard drinks    Types: 2 Cans of beer per week    Comment: social   Drug use: No   Social History   Social History Narrative   he prefers Dr. Percival Spanish to going back to the cardiologist at [facility] since Dr. Wyline Copas is no longer there.    family history includes Cancer in his father and mother; Heart failure in his mother.  Wt Readings from Last 3 Encounters:  04/22/19 242 lb (109.8 kg)  02/19/19 230 lb (104.3 kg)  02/20/18 229 lb (103.9 kg)    PHYSICAL EXAM BP 130/72    Pulse 72    Temp 97.7 F (36.5 C)    Ht _0  (1.93 m)    Wt 242 lb (109.8 kg)    SpO2 95%    BMI 29.46 kg/m  Physical Exam  Constitutional: He appears well-developed and well-nourished. No distress.  Healthy-appearing.  Well-groomed.  HENT:  Head: Normocephalic and atraumatic.  Mouth/Throat: No oropharyngeal exudate.  Eyes: Pupils are equal, round, and reactive to light. EOM are normal.  Neck: Normal range of motion. Neck supple. No JVD present.  Cardiovascular: Normal rate, regular rhythm, S1 normal, S2 normal and intact distal pulses.  Occasional extrasystoles (Better as he quieted down) are present. PMI is not displaced. Exam reveals decreased pulses. Exam reveals no friction rub.  Murmur (3/6 SEM @ RUSB;  very soft DM @ apex) heard. Pulmonary/Chest: Effort normal and breath sounds normal. No respiratory distress. He has no wheezes. He has no rales.  He exhibits no tenderness.  Abdominal: Soft. Bowel sounds are normal. He exhibits no distension. There is no abdominal tenderness.  Total obesity  Musculoskeletal: Normal range of motion.        General: Edema (Trivial) present.  Psychiatric: He has a normal mood and affect. His behavior is normal. Judgment and thought  content normal.  Nursing note and vitals reviewed.    Adult ECG Report Sinus rhythm with PACs, rate 63 bpm.  Otherwise normal EKG.  Other studies Reviewed: Additional studies/ records that were reviewed today include:  Recent Labs:   Last documented lipids show total cholesterol 136, TG 149, HDL 48 and LDL 78 (in 2017.  ASSESSMENT / PLAN: Problem List Items Addressed This Visit    Angina, class II ([place]) - New onset - Primary    Somewhat confusing circumstances, but it sounds like his angina had been well controlled on Imdur, but his prior cardiologist told him to stop it several months back.  He now presents with recurrent exertional chest pain that is probably class II in nature, but he is already on carvedilol and Imdur with minimal relief. We talked about invasive versus noninvasive evaluation options.  I just do not think that a noninvasive option will be all that helpful because he still has symptoms.  This is similar to his prior angina symptoms, and your family get back into his exercise regimen he would need confirmatory data. I think a nuclear stress test will probably be abnormal with his occluded ramus/OM branch. If there is no change in anatomy, then we can potentially consider CTO PCI of the ramus/OM.  For now I will have him continue taking Imdur, but increase dose to 60 mg and continue beta-blocker. -->  Next option if medical management is necessary would be to titrate beta-blocker and consider Ranexa.  Plan: Left heart catheterization with coronary angiography and possible PCI for recurrent class II angina on optimal medical management.      Relevant Medications   isosorbide mononitrate (IMDUR) 60 MG 24 hr tablet   Other Relevant Orders   CBC with Differential   Basic Metabolic Panel (BMET)   Coronary artery disease involving native coronary artery of native heart with angina pectoris ([place]) (Chronic)    He clearly had two-vessel disease involving the RCA and OM/ramus.   I am not sure how much of his symptoms we would be related to the basically subacute CTO of the RI/OM.  He felt better despite unsuccessful attempted PCI of the RI/OM branch and the symptoms are pretty well controlled but now recurrent. This would lead me to believe that there is possible progression of disease in his other coronary arteries.  He is on aspirin without Plavix (or other Thienopyridine antiplatelet agent), we are increasing Imdur to 60 mg and continuing current dose of losartan and carvedilol. He was started on PCSK9 inhibitor for lipids.  Planning left heart catheterization with native coronary angiography and possible PCI to evaluate for recurrent angina.      Relevant Medications   isosorbide mononitrate (IMDUR) 60 MG 24 hr tablet   Other Relevant Orders   EKG 12-Lead   CBC with Differential   Basic Metabolic Panel (BMET)   Essential hypertension (Chronic)    Blood pressure improved well-controlled on carvedilol and losartan.  He does have heart rate we will decrease carvedilol if additional antianginal treatment is warranted.  Otherwise stable.      Relevant Medications   isosorbide mononitrate (IMDUR) 60 MG 24 hr tablet  Other Relevant Orders   EKG 12-Lead   CBC with Differential   Basic Metabolic Panel (BMET)   Hyperlipidemia with target LDL less than 70; statin intolerant.  Now on Repatha (Chronic)    Patient started on Repatha.  Last LDL that have seen checked was in 2017.  Will need to get labs from PCP.      Relevant Medications   isosorbide mononitrate (IMDUR) 60 MG 24 hr tablet   Other Relevant Orders   CBC with Differential   Basic Metabolic Panel (BMET)      I spent a total of 25 minutes with the patient and 10 min in chart review. >  50% of the time was spent in direct patient consultation.    Current medicines are reviewed at length with the patient today.  (+/- concerns) None The following changes have been made:  see below.   Patient  Instructions  Medication Instructions:  INCREASE Imdur to 42m Take 1 tablet once a day  If you need a refill on your cardiac medications before your next appointment, please call your pharmacy.   Lab work: Orders Placed This Encounter  Procedures   CBC with Differential   Basic Metabolic Panel (BMET)   EKG 12-Lead   -- Left Heart Catheterization with Coronary Angiography & Possible Percutaneous Coronary Intervention  Follow-Up: You will need a follow up appointment in 2-3 weeks AFTER CATH.  Please call our office 2 months in advance to schedule this appointment.  You may see JMinus Breeding MD or one of the following Advanced Practice Providers on your designated Care Team:  RRosaria Ferries PA-C, KJory Sims DNP, ANP  Any Other Special Instructions Will Be Listed Below (If Applicable).    Studies Ordered:   Orders Placed This Encounter  Procedures   CBC with Differential   Basic Metabolic Panel (BMET)   EKG 12-Lead   Left Heart Catheterization with Native Coronary Angiography & possible Percutaneous Coronary Intervention    DGlenetta Hew M.D., M.S. Interventional Cardiologist   Pager # [phone] # [phone] [address] [place] [id number]  Thank you [facility]!

## 2019-04-22 NOTE — H&P (View-Only) (Signed)
PCP: Raina Mina., MD  Cardiologist: Dr. Percival Spanish (he prefers Dr. Percival Spanish to going back to the cardiologist at [facility] since Dr. Wyline Copas is no longer there) Rosaria Ferries, PA  Clinic Note: Chief Complaint  Patient presents with   Follow-up    CAD   Chest Pain    Persistent  with Imdur    HPI: Anthony Hartman is a 78 y.o. male with a PMH most noted for CAD-PCI GERD risk factors hypertension, Diabetes mellitus type 2, hyperlipidemia and OSA on CPAP below who presents today for reevaluation of exertional chest discomfort.  CAD w/ 100% OM(RI) 2012 and 2017 BMS x2 RCA, 2018 RI(OM) failed PCI, DM2, HTN, mild carotid dz, HLD intol statins>> recently initiated on PCSK9, OSA on CPAP, spinal stenosis s/p surgery,  depression, GERD, bladder CA  [name] was last seen on April 9 via virtual visit by Rosaria Ferries, PA.  He was noticing worsening lower extremity edema worse at night.  Gets better when he puts it up at night.  Has multiple admissions.  Not wearing support stockings.  No orthopnea PND.  Low improved exertional dyspnea. He noted chest pain walking up steep hill or heavy lifting, not noted to pace himself.  Similar to previous anginal equivalent.  Relieved with rest.  Nitroglycerin tends to lower his pressure so does not use it. --Added Imdur and continued aspirin and carvedilol. --Plan was to consider cath if symptoms not relieved.  Recent Hospitalizations:   None  Studies Personally Reviewed - (if available, images/films reviewed: From Epic Chart or Care Everywhere)  2012 Cath - subtotal CTO of OM/I   Cardiac Cath 03/21/2017 (Dr. Chuck Hint - [place]): OM(RI) ~ CTO (unfixable PCI unable to cross with wire/balloon. pLAD 40%, ost D1 40%;  prox-mid RCA 15%.  Mild stenosis with small mid LCx.  EF 60%.  Acute Inferior STEMI- Cardiac Cath -- November 2017: Mid LAD 30%, D2 50%.  OM 1/RI 100%, mid circumflex 30%.  Acute mid RCA 100% --> Integrity BMS 3.5 mm x 81m (3.8  mm) & 3.5 mm x 12 mm (4.1 mm) --BMS used because of need for back surgery.  EF 50% with inferobasal hypokinesis.  Echocardiogram June 19, 2018 ([facility]: Normal LV function/thickness.  EF 50 to 55%.  Normal wall motion.  Mild to moderate MAC.  Moderate to severe aortic stenosis (calcified).  Mild aortic insufficiency.  Interval History: EBillyjackreturns here today to discuss his symptoms.  He said that he is still noticing having a tightness and dyspnea sensation doing edema when walking on flat ground.  Trying to carry something or walks about incline, he will notice of prolonged chest discomfort there is nitroglycerin responsive.  The Imdur has helped some.  But he indicated that he has been on Imdur in the past after the failed PCI back back a year ago.  Unfortunately his cardiologist been told that he did not need to be on it so he has been off of it for a few months.  He subsequently developed now progressive exertional dyspnea and chest tightness.  At this point he is very scared even doing things like low back pain golf or his routine exercise.  He is concerned that every time he is done more, has had more symptoms.  He now is noticing discomfort and dyspnea walking uphill/incline or upstairs, but not. No resting dyspnea.  Really leading up to this he was very active, exercise.  Now that he has been  having this exertional dyspnea and chest discomfort he has been reluctant to do that.  He has been doing a lot of heavy exertional your yard work which can also cause symptoms.  He does note with either the yard work or even a walking uphill, if he paces himself and goes slow, he feels better.  He does say that since starting back on the Imdur symptoms may have improved a little bit but they are still persistent and he still quite concerned.  No PND, orthopnea with end of day edema (Left> Right ankle edema). Uses CPAP  No palpitations, lightheadedness, weakness or syncope/near syncope. --  Occasional balance issues . Dizziness since Neck Sgx.  No TIA/amaurosis fugax symptoms. No melena, hematuria, or epstaxis.  Easy bruising; + blood in stool associated with constipation. No claudication.  ROS: A comprehensive was performed. Review of Systems  Constitutional: Positive for malaise/fatigue. Negative for weight loss.  HENT: Negative for nosebleeds.   Respiratory: Negative for sputum production and shortness of breath (Only with exertion).   Cardiovascular: Positive for leg swelling (With hemostasis changes). Negative for claudication.  Gastrointestinal: Negative for blood in stool and melena.  Genitourinary: Positive for frequency (5 times a night nocturia). Negative for hematuria.  Musculoskeletal: Negative for myalgias.  Neurological: Positive for dizziness (Only he stands up too fast), tingling (Occasionally notes Legs tingling - while sitting - better with elevation) and headaches (Mild headache associated with Imdur.).  Psychiatric/Behavioral: Negative for depression (Maybe mildly dysthymic). The patient is not nervous/anxious and does not have insomnia.   All other systems reviewed and are negative.   The patient does not have symptoms concerning for COVID-19 infection (fever, chills, cough, or new shortness of breath).  The patient is practicing social distancing.   COVID-19 Education: The signs and symptoms of COVID-19 were discussed with the patient and how to seek care for testing (follow up with PCP or arrange E-visit).   The importance of social distancing was discussed today.   I have reviewed and (if needed) personally updated the patient's problem list, medications, allergies, past medical and surgical history, social and family history.   Past Medical History:  Diagnosis Date   Anxiety    Arthritis    Bladder cancer ([place])    Bladder stone    BPH (benign prostatic hypertrophy)    Depression    GERD (gastroesophageal reflux disease)    History  of bladder cancer    CARCINOMA IN SITU OF BLADDER   History of kidney stones    History of myocardial infarction    07-17-2011--   MI W/ OCCLUDED OM1. 2017 occluded RCA treated with a bare-metal stent, high-grade ramus intermediate stenosis in 2018 with failed attempted PCI   Hyperlipidemia    Hypertension    Myocardial infarction [facility]) 2012; 2017   2012 - noted Subtotal CTO of OM/RI;  09/2016 - Acute Infer STEMI - BMS x 2 mRCA (3.5 mm x 15 & 3.5 x 12 - tapered 4.0 - 3.8 mm)   OSA (obstructive sleep apnea)    cpap non-compliant    Prostate cancer [facility])     Past Surgical History:  Procedure Laterality Date   ARTHRODESIS METATARSALPHALANGEAL JOINT (MTPJ) Right 02/20/2018   Procedure: Right Hallux Metatarsal Phalangeal Joint Arthrodesis; Right Silver Bunionectomy;  Surgeon: Wylene Simmer, MD;  Location: [place];  Service: Orthopedics;  Laterality: Right;   BACK SURGERY     CARDIOVASCULAR STRESS TEST  04/22/11   CORONARY ANGIOPLASTY  03/2017  failed PCI RI (OM1) 2018 -- similar to 2012.   CORONARY STENT INTERVENTION  09/2016   [facility] (Acute Inferior STEMI)- Acute mid RCA 100% --> Integrity BMS 3.5 mm x 31m (3.8 mm) & 3.5 mm x 12 mm (4.1 mm) --BMS used because of need for back surgery   CYSTO/ BLADDER BX'S  X2  2001  / X1  2003   CYSTO/ BLADDER BX'S/ BILATERAL RETROGRADE URETEROPYLEGRAM  2003; 2007; 2008;  12-30-2007   CARCINOMA IN SITU OF BLADDER   CYSTOSCOPY WITH LITHOLAPAXY N/A 05/07/2013   Procedure: CYSTOSCOPY WITH LITHOLAPAXY;  Surgeon: SFranchot Gallo MD;  Location: [facility];  Service: Urology;  Laterality: N/A;   EHL Lengthening Right 05/10/2017   RT FOOT   Hammer Toe Repair Right 05/10/2017   Rt #2 Toe   HAMMERTOE RECONSTRUCTION WITH WEIL OSTEOTOMY Right 02/20/2018   Procedure: Right Second Metatarsal WPriscille Heidelbergand Revision Hammertoe Correction;  Surgeon: HWylene Simmer MD;  [place]  Service:  Orthopedics;  Laterality: Right;   LEFT HEART CATH AND CORONARY ANGIOGRAPHY  07/17/2011   OCCLUDED OM1 WITH UNSUCCESSFUL PCI ATTEMPT, O/W MILD TO MODERATE DISEASE, DR ANDY CHIU ([facility])     LEFT HEART CATH AND CORONARY ANGIOGRAPHY  09/2016   Acute Inferior STEMI ([facility])  - Mid LAD 30%, D2 50%.  OM 1/RI 100%, mid circumflex 30%.  Acute mRCA 100% (thrombotic) - BMS PCI.  EF 50% with inferobasal hypokinesis.   LEFT HEART CATH AND CORONARY ANGIOGRAPHY  03/2017   (Dr. RChuck Hint- [place]): OM(RI) ~ CTO (unfixable PCI unable to cross with wire/balloon. pLAD 40%, ost D1 40%;  prox-mid RCA 15%.  Mild stenosis with small mid LCx.  EF 60%.   LUMBAR DECOMPRESSION FORAMINOTOMY L3 -- L5/  REMOVAL CYST L4-5 FACET JOINT  08-21-2009   ALSO HAD PRIOR BACK SURG IN 1992   LUMBAR LAMINECTOMY  04/04/2017   L2-3   LUMBAR LAMINECTOMY/DECOMPRESSION MICRODISCECTOMY N/A 04/04/2017   Procedure: Laminectomy and Foraminotomy - Lumbar Two-Three;  Surgeon: JEustace Moore MD;  [place] [facility]  Service: Neurosurgery;  Laterality: N/A;   OPEN TENOTOMY OF FLEXOR 2ND AND 3RD RIGHT TOES  APRIL 2008   PROSTATE BIOPSY     TRANSTHORACIC ECHOCARDIOGRAM  07/17/2011   at [facility], MILD TO MODERATE CONCENTRIC LVH/ NORMAL LVSF/ EF 55-60%/ MILD AORTIC STENOSIS   TRANSTHORACIC ECHOCARDIOGRAM  06/2018   ([place]: Normal LV function/thickness.  EF 50 to 55%.  Normal wall motion.  Mild to moderate MAC.  Moderate to Severe Aortic Stenosis (calcified).  Mild aortic insufficiency.   TRANSURETHRAL RESECTION OF BLADDER TUMOR  01-10-2004   TRANSURETHRAL RESECTION OF PROSTATE N/A 05/07/2013   Procedure: TRANSURETHRAL RESECTION OF THE PROSTATE WITH GYRUS INSTRUMENTS;  Surgeon: SFranchot Gallo MD;  [facility];  Service: Urology;  Laterality: N/A;    Current Meds  Medication Sig   Alirocumab 75 MG/ML SOAJ Inject 1 mL into the skin every 14 (fourteen) days.   ALPRAZolam (XANAX) 0.5 MG  tablet Take 0.25 mg by mouth at bedtime as needed for anxiety.   aspirin EC 81 MG tablet Take 1 tablet (81 mg total) by mouth 2 (two) times daily. (Patient taking differently: Take 81 mg by mouth daily. )   buPROPion (WELLBUTRIN XL) 300 MG 24 hr tablet Take 300 mg by mouth daily.   carvedilol (COREG) 6.25 MG tablet Take 6.25 mg by mouth 2 (two) times daily with a meal.  famotidine (PEPCID) 20 MG tablet Take 1 tablet by mouth 2 (two) times daily as needed.   losartan (COZAAR) 25 MG tablet Take 1 tablet (25 mg total) by mouth daily.   Multiple Vitamin (MULTIVITAMIN WITH MINERALS) TABS tablet Take 1 tablet by mouth daily.   nitroGLYCERIN (NITROSTAT) 0.4 MG SL tablet Place 1 tablet (0.4 mg total) under the tongue every 5 (five) minutes as needed for chest pain.   traMADol (ULTRAM) 50 MG tablet Take 1 tablet by mouth daily as needed.   valACYclovir (VALTREX) 1000 MG tablet Take 1,000 mg by mouth 3 (three) times daily as needed (fever blisters).    [DISCONTINUED] isosorbide mononitrate (IMDUR) 30 MG 24 hr tablet Take 1 tablet (30 mg total) by mouth daily.    Allergies  Allergen Reactions   Statins     MD ORDERS UNSPECIFIED REACTION     Augmentin [Amoxicillin-Pot Clavulanate] Nausea And Vomiting     Has patient had a PCN reaction causing immediate rash, facial/tongue/throat swelling, SOB or lightheadedness with hypotension: No Has patient had a PCN reaction causing severe rash involving mucus membranes or skin necrosis: No Has patient had a PCN reaction that required hospitalization No Has patient had a PCN reaction occurring within the last 10 years: No If all of the above answers are "NO", then may proceed with Cephalosporin use.    Dexamethasone Other (See Comments)    Frequent Urination [? HYPERGLYCEMIA? ]   Sulfa Antibiotics Nausea And Vomiting    Social History   Tobacco Use   Smoking status: Former Smoker    Packs/day: 0.50    Years: 50.00    Pack years: 25.00     Types: Cigarettes    Last attempt to quit: 04/15/2010    Years since quitting: 9.0   Smokeless tobacco: Current User    Types: Snuff   Tobacco comment: occasional  snuff pack  Substance Use Topics   Alcohol use: Yes    Alcohol/week: 2.0 standard drinks    Types: 2 Cans of beer per week    Comment: social   Drug use: No   Social History   Social History Narrative   he prefers Dr. Percival Spanish to going back to the cardiologist at [facility] since Dr. Wyline Copas is no longer there.    family history includes Cancer in his father and mother; Heart failure in his mother.  Wt Readings from Last 3 Encounters:  04/22/19 242 lb (109.8 kg)  02/19/19 230 lb (104.3 kg)  02/20/18 229 lb (103.9 kg)    PHYSICAL EXAM BP 130/72    Pulse 72    Temp 97.7 F (36.5 C)    Ht _0  (1.93 m)    Wt 242 lb (109.8 kg)    SpO2 95%    BMI 29.46 kg/m  Physical Exam  Constitutional: He appears well-developed and well-nourished. No distress.  Healthy-appearing.  Well-groomed.  HENT:  Head: Normocephalic and atraumatic.  Mouth/Throat: No oropharyngeal exudate.  Eyes: Pupils are equal, round, and reactive to light. EOM are normal.  Neck: Normal range of motion. Neck supple. No JVD present.  Cardiovascular: Normal rate, regular rhythm, S1 normal, S2 normal and intact distal pulses.  Occasional extrasystoles (Better as he quieted down) are present. PMI is not displaced. Exam reveals decreased pulses. Exam reveals no friction rub.  Murmur (3/6 SEM @ RUSB;  very soft DM @ apex) heard. Pulmonary/Chest: Effort normal and breath sounds normal. No respiratory distress. He has no wheezes. He has no rales.  He exhibits no tenderness.  Abdominal: Soft. Bowel sounds are normal. He exhibits no distension. There is no abdominal tenderness.  Total obesity  Musculoskeletal: Normal range of motion.        General: Edema (Trivial) present.  Psychiatric: He has a normal mood and affect. His behavior is normal. Judgment and thought  content normal.  Nursing note and vitals reviewed.    Adult ECG Report Sinus rhythm with PACs, rate 63 bpm.  Otherwise normal EKG.  Other studies Reviewed: Additional studies/ records that were reviewed today include:  Recent Labs:   Last documented lipids show total cholesterol 136, TG 149, HDL 48 and LDL 78 (in 2017.  ASSESSMENT / PLAN: Problem List Items Addressed This Visit    Angina, class II ([place]) - New onset - Primary    Somewhat confusing circumstances, but it sounds like his angina had been well controlled on Imdur, but his prior cardiologist told him to stop it several months back.  He now presents with recurrent exertional chest pain that is probably class II in nature, but he is already on carvedilol and Imdur with minimal relief. We talked about invasive versus noninvasive evaluation options.  I just do not think that a noninvasive option will be all that helpful because he still has symptoms.  This is similar to his prior angina symptoms, and your family get back into his exercise regimen he would need confirmatory data. I think a nuclear stress test will probably be abnormal with his occluded ramus/OM branch. If there is no change in anatomy, then we can potentially consider CTO PCI of the ramus/OM.  For now I will have him continue taking Imdur, but increase dose to 60 mg and continue beta-blocker. -->  Next option if medical management is necessary would be to titrate beta-blocker and consider Ranexa.  Plan: Left heart catheterization with coronary angiography and possible PCI for recurrent class II angina on optimal medical management.      Relevant Medications   isosorbide mononitrate (IMDUR) 60 MG 24 hr tablet   Other Relevant Orders   CBC with Differential   Basic Metabolic Panel (BMET)   Coronary artery disease involving native coronary artery of native heart with angina pectoris ([place]) (Chronic)    He clearly had two-vessel disease involving the RCA and OM/ramus.   I am not sure how much of his symptoms we would be related to the basically subacute CTO of the RI/OM.  He felt better despite unsuccessful attempted PCI of the RI/OM branch and the symptoms are pretty well controlled but now recurrent. This would lead me to believe that there is possible progression of disease in his other coronary arteries.  He is on aspirin without Plavix (or other Thienopyridine antiplatelet agent), we are increasing Imdur to 60 mg and continuing current dose of losartan and carvedilol. He was started on PCSK9 inhibitor for lipids.  Planning left heart catheterization with native coronary angiography and possible PCI to evaluate for recurrent angina.      Relevant Medications   isosorbide mononitrate (IMDUR) 60 MG 24 hr tablet   Other Relevant Orders   EKG 12-Lead   CBC with Differential   Basic Metabolic Panel (BMET)   Essential hypertension (Chronic)    Blood pressure improved well-controlled on carvedilol and losartan.  He does have heart rate we will decrease carvedilol if additional antianginal treatment is warranted.  Otherwise stable.      Relevant Medications   isosorbide mononitrate (IMDUR) 60 MG 24 hr tablet  Other Relevant Orders   EKG 12-Lead   CBC with Differential   Basic Metabolic Panel (BMET)   Hyperlipidemia with target LDL less than 70; statin intolerant.  Now on Repatha (Chronic)    Patient started on Repatha.  Last LDL that have seen checked was in 2017.  Will need to get labs from PCP.      Relevant Medications   isosorbide mononitrate (IMDUR) 60 MG 24 hr tablet   Other Relevant Orders   CBC with Differential   Basic Metabolic Panel (BMET)      I spent a total of 25 minutes with the patient and 10 min in chart review. >  50% of the time was spent in direct patient consultation.    Current medicines are reviewed at length with the patient today.  (+/- concerns) None The following changes have been made:  see below.   Patient  Instructions  Medication Instructions:  INCREASE Imdur to 42m Take 1 tablet once a day  If you need a refill on your cardiac medications before your next appointment, please call your pharmacy.   Lab work: Orders Placed This Encounter  Procedures   CBC with Differential   Basic Metabolic Panel (BMET)   EKG 12-Lead   -- Left Heart Catheterization with Coronary Angiography & Possible Percutaneous Coronary Intervention  Follow-Up: You will need a follow up appointment in 2-3 weeks AFTER CATH.  Please call our office 2 months in advance to schedule this appointment.  You may see JMinus Breeding MD or one of the following Advanced Practice Providers on your designated Care Team:  RRosaria Ferries PA-C, KJory Sims DNP, ANP  Any Other Special Instructions Will Be Listed Below (If Applicable).    Studies Ordered:   Orders Placed This Encounter  Procedures   CBC with Differential   Basic Metabolic Panel (BMET)   EKG 12-Lead   Left Heart Catheterization with Native Coronary Angiography & possible Percutaneous Coronary Intervention    DGlenetta Hew M.D., M.S. Interventional Cardiologist   Pager # [phone] # [phone] [address] [place] [id number]  Thank you for choosing [facility]!

## 2019-04-23 LAB — CBC WITH DIFFERENTIAL/PLATELET
Basophils Absolute: 0.1 10*3/uL (ref 0.0–0.2)
Basos: 1 %
EOS (ABSOLUTE): 0.2 10*3/uL (ref 0.0–0.4)
Eos: 3 %
Hematocrit: 42.3 % (ref 37.5–51.0)
Hemoglobin: 14.3 g/dL (ref 13.0–17.7)
Immature Grans (Abs): 0 10*3/uL (ref 0.0–0.1)
Immature Granulocytes: 1 %
Lymphocytes Absolute: 0.7 10*3/uL (ref 0.7–3.1)
Lymphs: 10 %
MCH: 31.5 pg (ref 26.6–33.0)
MCHC: 33.8 g/dL (ref 31.5–35.7)
MCV: 93 fL (ref 79–97)
Monocytes Absolute: 0.9 10*3/uL (ref 0.1–0.9)
Monocytes: 13 %
Neutrophils Absolute: 4.7 10*3/uL (ref 1.4–7.0)
Neutrophils: 72 %
Platelets: 245 10*3/uL (ref 150–450)
RBC: 4.54 x10E6/uL (ref 4.14–5.80)
RDW: 12.8 % (ref 11.6–15.4)
WBC: 6.6 10*3/uL (ref 3.4–10.8)

## 2019-04-23 LAB — BASIC METABOLIC PANEL
BUN/Creatinine Ratio: 14 (ref 10–24)
BUN: 14 mg/dL (ref 8–27)
CO2: 24 mmol/L (ref 20–29)
Calcium: 9.2 mg/dL (ref 8.6–10.2)
Chloride: 103 mmol/L (ref 96–106)
Creatinine, Ser: 1.01 mg/dL (ref 0.76–1.27)
GFR calc Af Amer: 82 mL/min/{1.73_m2} (ref >59)
GFR calc non Af Amer: 71 mL/min/{1.73_m2} (ref >59)
Glucose: 74 mg/dL (ref 65–99)
Potassium: 4.6 mmol/L (ref 3.5–5.2)
Sodium: 140 mmol/L (ref 134–144)

## 2019-04-24 ENCOUNTER — Other Ambulatory Visit: Payer: Self-pay | Admitting: *Deleted

## 2019-04-24 DIAGNOSIS — I209 Angina pectoris, unspecified: Secondary | ICD-10-CM

## 2019-04-24 DIAGNOSIS — Z01818 Encounter for other preprocedural examination: Secondary | ICD-10-CM

## 2019-04-24 NOTE — Progress Notes (Signed)
ORDER PLACED FOR  LEFT HEART CATH SCHEDULE 04/29/19

## 2019-04-27 ENCOUNTER — Telehealth: Payer: Self-pay | Admitting: *Deleted

## 2019-04-27 NOTE — Telephone Encounter (Signed)
The patient's wife has been notified of the result for cardiac cath and verbalized understanding.  All questions (if any) were answered. Raiford Simmonds, RN 04/27/2019 11:10 AM  She is also aware of patient having covid test 6/16 at 3:05 pm  She states she let patient know

## 2019-04-28 ENCOUNTER — Other Ambulatory Visit (HOSPITAL_COMMUNITY)
Admission: RE | Admit: 2019-04-28 | Discharge: 2019-04-28 | Disposition: A | Discharge status: Home / Self Care | Payer: Medicare HMO | Source: Ambulatory Visit | Attending: Interventional Cardiology | Admitting: Interventional Cardiology

## 2019-04-28 DIAGNOSIS — Z1159 Encounter for screening for other viral diseases: Secondary | ICD-10-CM | POA: Diagnosis present

## 2019-04-29 ENCOUNTER — Ambulatory Visit: Payer: Medicare HMO | Admitting: Adult Health

## 2019-04-29 LAB — NOVEL CORONAVIRUS, NAA (HOSP ORDER, SEND-OUT TO REF LAB; TAT 18-24 HRS): SARS-CoV-2, NAA: NOT DETECTED

## 2019-04-30 ENCOUNTER — Telehealth: Payer: Self-pay | Admitting: *Deleted

## 2019-04-30 NOTE — Addendum Note (Signed)
Addended by: Katrine Coho on: 04/30/2019 09:53 AM   Modules accepted: Orders

## 2019-04-30 NOTE — Telephone Encounter (Addendum)
Pt contacted pre-catheterization scheduled at [facility] for: Friday May 01, 2019 10:30 AM Verified arrival time and place: [place] Entrance A at: 8:30 AM  Covid-19 test date: 04/28/19  No solid food after midnight prior to cath, clear liquids until 5 AM day of procedure. Contrast allergy: no   AM meds can be  taken pre-cath with sip of water including: ASA 81 mg  Confirmed patient has responsible person to drive home post procedure and observe 24 hours after arriving home: yes  Due to Covid-19 pandemic no visitors are allowed in the hospital (unless cognitive impairment).  Their designated party will be called when their procedure is over for an update and to arrange pick up.  Patients are required to wear a mask when they enter the hospital.       COVID-19 Pre-Screening Questions:  . In the past 7 to 10 days have you had a cough,  shortness of breath, headache, congestion, fever (100 or greater) body aches, chills, sore throat, or sudden loss of taste or sense of smell? Some chronic congestion . Have you been around anyone with known Covid 19? no . Have you been around anyone who is awaiting Covid 19 test results in the past 7 to 10 days? no . Have you been around anyone who has been exposed to Covid 19, or has mentioned symptoms of Covid 19 within the past 7 to 10 days? no      I reviewed procedure instructions/mask/visitor/Covid-19 screening questions with patient, he verbalized understanding.

## 2019-05-01 ENCOUNTER — Encounter (HOSPITAL_COMMUNITY)
Admission: RE | Disposition: A | Discharge status: Home / Self Care | Payer: Self-pay | Source: Home / Self Care | Attending: Interventional Cardiology

## 2019-05-01 ENCOUNTER — Ambulatory Visit (HOSPITAL_COMMUNITY)
Admission: RE | Admit: 2019-05-01 | Discharge: 2019-05-01 | Disposition: A | Discharge status: Home / Self Care | Payer: Medicare HMO | Attending: Interventional Cardiology | Admitting: Interventional Cardiology

## 2019-05-01 ENCOUNTER — Other Ambulatory Visit: Payer: Self-pay

## 2019-05-01 DIAGNOSIS — E119 Type 2 diabetes mellitus without complications: Secondary | ICD-10-CM | POA: Diagnosis not present

## 2019-05-01 DIAGNOSIS — I209 Angina pectoris, unspecified: Secondary | ICD-10-CM | POA: Diagnosis present

## 2019-05-01 DIAGNOSIS — I2510 Atherosclerotic heart disease of native coronary artery without angina pectoris: Secondary | ICD-10-CM

## 2019-05-01 DIAGNOSIS — Z955 Presence of coronary angioplasty implant and graft: Secondary | ICD-10-CM | POA: Diagnosis not present

## 2019-05-01 DIAGNOSIS — I2582 Chronic total occlusion of coronary artery: Secondary | ICD-10-CM | POA: Insufficient documentation

## 2019-05-01 DIAGNOSIS — I252 Old myocardial infarction: Secondary | ICD-10-CM | POA: Diagnosis not present

## 2019-05-01 DIAGNOSIS — Z87891 Personal history of nicotine dependence: Secondary | ICD-10-CM | POA: Insufficient documentation

## 2019-05-01 DIAGNOSIS — Z888 Allergy status to other drugs, medicaments and biological substances status: Secondary | ICD-10-CM | POA: Insufficient documentation

## 2019-05-01 DIAGNOSIS — M1990 Unspecified osteoarthritis, unspecified site: Secondary | ICD-10-CM | POA: Insufficient documentation

## 2019-05-01 DIAGNOSIS — I350 Nonrheumatic aortic (valve) stenosis: Secondary | ICD-10-CM | POA: Diagnosis not present

## 2019-05-01 DIAGNOSIS — Z9861 Coronary angioplasty status: Secondary | ICD-10-CM

## 2019-05-01 DIAGNOSIS — Z880 Allergy status to penicillin: Secondary | ICD-10-CM | POA: Diagnosis not present

## 2019-05-01 DIAGNOSIS — N400 Enlarged prostate without lower urinary tract symptoms: Secondary | ICD-10-CM | POA: Insufficient documentation

## 2019-05-01 DIAGNOSIS — Z01818 Encounter for other preprocedural examination: Secondary | ICD-10-CM

## 2019-05-01 DIAGNOSIS — I10 Essential (primary) hypertension: Secondary | ICD-10-CM | POA: Diagnosis not present

## 2019-05-01 DIAGNOSIS — E785 Hyperlipidemia, unspecified: Secondary | ICD-10-CM | POA: Diagnosis not present

## 2019-05-01 DIAGNOSIS — Z79899 Other long term (current) drug therapy: Secondary | ICD-10-CM | POA: Diagnosis not present

## 2019-05-01 DIAGNOSIS — K219 Gastro-esophageal reflux disease without esophagitis: Secondary | ICD-10-CM | POA: Insufficient documentation

## 2019-05-01 DIAGNOSIS — Z881 Allergy status to other antibiotic agents status: Secondary | ICD-10-CM | POA: Diagnosis not present

## 2019-05-01 DIAGNOSIS — Z7982 Long term (current) use of aspirin: Secondary | ICD-10-CM | POA: Diagnosis not present

## 2019-05-01 DIAGNOSIS — I25119 Atherosclerotic heart disease of native coronary artery with unspecified angina pectoris: Secondary | ICD-10-CM | POA: Diagnosis not present

## 2019-05-01 DIAGNOSIS — Z882 Allergy status to sulfonamides status: Secondary | ICD-10-CM | POA: Insufficient documentation

## 2019-05-01 DIAGNOSIS — G4733 Obstructive sleep apnea (adult) (pediatric): Secondary | ICD-10-CM | POA: Insufficient documentation

## 2019-05-01 DIAGNOSIS — Z7902 Long term (current) use of antithrombotics/antiplatelets: Secondary | ICD-10-CM | POA: Diagnosis not present

## 2019-05-01 HISTORY — PX: CORONARY STENT INTERVENTION: CATH118234

## 2019-05-01 HISTORY — PX: LEFT HEART CATH AND CORONARY ANGIOGRAPHY: CATH118249

## 2019-05-01 LAB — POCT ACTIVATED CLOTTING TIME
Activated Clotting Time: 274 seconds
Activated Clotting Time: 274 seconds

## 2019-05-01 SURGERY — LEFT HEART CATH AND CORONARY ANGIOGRAPHY
Anesthesia: LOCAL

## 2019-05-01 MED ORDER — VERAPAMIL HCL 2.5 MG/ML IV SOLN
INTRAVENOUS | Status: DC | PRN
  Administered 2019-05-01: 10 mL via INTRA_ARTERIAL

## 2019-05-01 MED ORDER — ASPIRIN EC 81 MG PO TBEC: 81.0000 mg | DELAYED_RELEASE_TABLET | Freq: Every day | ORAL | Status: DC

## 2019-05-01 MED ORDER — CLOPIDOGREL BISULFATE 75 MG PO TABS: 75.0000 mg | ORAL_TABLET | Freq: Every day | ORAL | 0 refills | Status: DC

## 2019-05-01 MED ORDER — VERAPAMIL HCL 2.5 MG/ML IV SOLN
INTRAVENOUS | Status: AC
  Filled 2019-05-01: qty 2

## 2019-05-01 MED ORDER — SODIUM CHLORIDE 0.9% FLUSH: 3.0000 mL | Freq: Two times a day (BID) | INTRAVENOUS | Status: DC

## 2019-05-01 MED ORDER — ALPRAZOLAM 0.5 MG PO TABS: 0.5000 mg | ORAL_TABLET | Freq: Every day | ORAL | Status: DC

## 2019-05-01 MED ORDER — HEPARIN SODIUM (PORCINE) 1000 UNIT/ML IJ SOLN
INTRAMUSCULAR | Status: DC | PRN
  Administered 2019-05-01: 6000 [IU] via INTRAVENOUS
  Administered 2019-05-01: 5000 [IU] via INTRAVENOUS
  Administered 2019-05-01: 3000 [IU] via INTRAVENOUS

## 2019-05-01 MED ORDER — ASPIRIN 81 MG PO CHEW: 81.0000 mg | CHEWABLE_TABLET | Freq: Every day | ORAL | Status: DC

## 2019-05-01 MED ORDER — SODIUM CHLORIDE 0.9 % IV SOLN
INTRAVENOUS | Status: DC
  Administered 2019-05-01: 10:00:00 via INTRAVENOUS

## 2019-05-01 MED ORDER — HEPARIN SODIUM (PORCINE) 1000 UNIT/ML IJ SOLN
INTRAMUSCULAR | Status: AC
  Filled 2019-05-01: qty 1

## 2019-05-01 MED ORDER — SODIUM CHLORIDE 0.9% FLUSH: 3.0000 mL | INTRAVENOUS | Status: DC | PRN

## 2019-05-01 MED ORDER — ACETAMINOPHEN 500 MG PO TABS: 500.0000 mg | ORAL_TABLET | Freq: Four times a day (QID) | ORAL | Status: DC | PRN

## 2019-05-01 MED ORDER — NITROGLYCERIN 0.4 MG SL SUBL: 0.4000 mg | SUBLINGUAL_TABLET | SUBLINGUAL | Status: DC | PRN

## 2019-05-01 MED ORDER — FENTANYL CITRATE (PF) 100 MCG/2ML IJ SOLN
INTRAMUSCULAR | Status: DC | PRN
  Administered 2019-05-01 (×2): 25 ug via INTRAVENOUS

## 2019-05-01 MED ORDER — LIDOCAINE HCL (PF) 1 % IJ SOLN
INTRAMUSCULAR | Status: AC
  Filled 2019-05-01: qty 30

## 2019-05-01 MED ORDER — MIDAZOLAM HCL 2 MG/2ML IJ SOLN
INTRAMUSCULAR | Status: DC | PRN
  Administered 2019-05-01: 2 mg via INTRAVENOUS
  Administered 2019-05-01: 1 mg via INTRAVENOUS

## 2019-05-01 MED ORDER — CLOPIDOGREL BISULFATE 75 MG PO TABS: 75.0000 mg | ORAL_TABLET | Freq: Every day | ORAL | Status: DC

## 2019-05-01 MED ORDER — CLOPIDOGREL BISULFATE 300 MG PO TABS
ORAL_TABLET | ORAL | Status: DC | PRN
  Administered 2019-05-01: 600 mg via ORAL

## 2019-05-01 MED ORDER — LIDOCAINE HCL (PF) 1 % IJ SOLN
INTRAMUSCULAR | Status: DC | PRN
  Administered 2019-05-01: 2 mL

## 2019-05-01 MED ORDER — SODIUM CHLORIDE 0.9 % IV SOLN: INTRAVENOUS | Status: AC

## 2019-05-01 MED ORDER — FAMOTIDINE 20 MG PO TABS: 20.0000 mg | ORAL_TABLET | Freq: Two times a day (BID) | ORAL | Status: DC | PRN

## 2019-05-01 MED ORDER — IOHEXOL 350 MG/ML SOLN
INTRAVENOUS | Status: DC | PRN
  Administered 2019-05-01: 125 mL via INTRACARDIAC

## 2019-05-01 MED ORDER — LABETALOL HCL 5 MG/ML IV SOLN: 10.0000 mg | INTRAVENOUS | Status: DC | PRN

## 2019-05-01 MED ORDER — MIDAZOLAM HCL 2 MG/2ML IJ SOLN
INTRAMUSCULAR | Status: AC
  Filled 2019-05-01: qty 2

## 2019-05-01 MED ORDER — HEPARIN (PORCINE) IN NACL 1000-0.9 UT/500ML-% IV SOLN
INTRAVENOUS | Status: AC
  Filled 2019-05-01: qty 1000

## 2019-05-01 MED ORDER — ISOSORBIDE MONONITRATE ER 60 MG PO TB24: 60.0000 mg | ORAL_TABLET | Freq: Every day | ORAL | Status: DC

## 2019-05-01 MED ORDER — HEPARIN (PORCINE) IN NACL 1000-0.9 UT/500ML-% IV SOLN
INTRAVENOUS | Status: DC | PRN
  Administered 2019-05-01 (×2): 500 mL

## 2019-05-01 MED ORDER — HYDRALAZINE HCL 20 MG/ML IJ SOLN: 10.0000 mg | INTRAMUSCULAR | Status: DC | PRN

## 2019-05-01 MED ORDER — BUPROPION HCL ER (XL) 300 MG PO TB24: 300.0000 mg | ORAL_TABLET | Freq: Every day | ORAL | Status: DC

## 2019-05-01 MED ORDER — ADULT MULTIVITAMIN W/MINERALS CH: 1.0000 | ORAL_TABLET | Freq: Every day | ORAL | Status: DC

## 2019-05-01 MED ORDER — POLYETHYL GLYCOL-PROPYL GLYCOL 0.4-0.3 % OP SOLN: 1.0000 [drp] | Freq: Three times a day (TID) | OPHTHALMIC | Status: DC | PRN

## 2019-05-01 MED ORDER — ACETAMINOPHEN 325 MG PO TABS: 650.0000 mg | ORAL_TABLET | ORAL | Status: DC | PRN

## 2019-05-01 MED ORDER — TRAMADOL HCL 50 MG PO TABS: 50.0000 mg | ORAL_TABLET | Freq: Four times a day (QID) | ORAL | Status: DC | PRN

## 2019-05-01 MED ORDER — CARVEDILOL 6.25 MG PO TABS: 6.2500 mg | ORAL_TABLET | Freq: Two times a day (BID) | ORAL | Status: DC

## 2019-05-01 MED ORDER — LOSARTAN POTASSIUM 25 MG PO TABS: 25.0000 mg | ORAL_TABLET | Freq: Every day | ORAL | Status: DC

## 2019-05-01 MED ORDER — FENTANYL CITRATE (PF) 100 MCG/2ML IJ SOLN
INTRAMUSCULAR | Status: AC
  Filled 2019-05-01: qty 2

## 2019-05-01 MED ORDER — ALIROCUMAB 75 MG/ML ~~LOC~~ SOAJ: 1.0000 mL | SUBCUTANEOUS | Status: DC

## 2019-05-01 MED ORDER — SODIUM CHLORIDE 0.9 % IV SOLN: 250.0000 mL | INTRAVENOUS | Status: DC | PRN

## 2019-05-01 MED ORDER — VALACYCLOVIR HCL 500 MG PO TABS: 1000.0000 mg | ORAL_TABLET | Freq: Three times a day (TID) | ORAL | Status: DC | PRN

## 2019-05-01 MED ORDER — TESTOSTERONE CYPIONATE 200 MG/ML IM SOLN: 120.0000 mg | INTRAMUSCULAR | Status: DC

## 2019-05-01 MED ORDER — CLOPIDOGREL BISULFATE 300 MG PO TABS
ORAL_TABLET | ORAL | Status: AC
  Filled 2019-05-01: qty 2

## 2019-05-01 MED ORDER — ONDANSETRON HCL 4 MG/2ML IJ SOLN: 4.0000 mg | Freq: Four times a day (QID) | INTRAMUSCULAR | Status: DC | PRN

## 2019-05-01 MED FILL — CLOPIDOGREL 75 MG TABLET: 75 | 30 days supply | Qty: 30 | Fill #0

## 2019-05-01 SURGICAL SUPPLY — 18 items
BALLN SAPPHIRE 2.0X15 (BALLOONS) ×2
BALLN WOLVERINE 2.50X10 (BALLOONS) ×2
BALLOON SAPPHIRE 2.0X15 (BALLOONS) ×1 IMPLANT
BALLOON WOLVERINE 2.50X10 (BALLOONS) ×1 IMPLANT
CATH 5FR JL3.5 JR4 ANG PIG MP (CATHETERS) ×2 IMPLANT
CATH LAUNCHER 6FR EBU3.5 (CATHETERS) ×2 IMPLANT
DEVICE RAD COMP TR BAND LRG (VASCULAR PRODUCTS) ×2 IMPLANT
GLIDESHEATH SLEND SS 6F .021 (SHEATH) ×2 IMPLANT
GUIDEWIRE INQWIRE 1.5J.035X260 (WIRE) ×1 IMPLANT
INQWIRE 1.5J .035X260CM (WIRE) ×2
KIT ENCORE 26 ADVANTAGE (KITS) ×2 IMPLANT
KIT HEART LEFT (KITS) ×2 IMPLANT
KIT HEMO VALVE WATCHDOG (MISCELLANEOUS) ×2 IMPLANT
PACK CARDIAC CATHETERIZATION (CUSTOM PROCEDURE TRAY) ×2 IMPLANT
TRANSDUCER W/STOPCOCK (MISCELLANEOUS) ×2 IMPLANT
TUBING CIL FLEX 10 FLL-RA (TUBING) ×2 IMPLANT
WIRE ASAHI PROWATER 180CM (WIRE) ×2 IMPLANT
WIRE HI TORQ BMW 190CM (WIRE) ×2 IMPLANT

## 2019-05-01 NOTE — Progress Notes (Signed)
CARDIAC REHAB PHASE I   Education completed with pt. Pt educated on importance of ASA, Plavix, and Imdur. Pt given heart healthy diet. Reviewed restrictions and exercise guidelines. Will refer to CRP II [place].  [id number] Rufina Falco, RN BSN 05/01/2019 12:08 PM

## 2019-05-01 NOTE — Interval H&P Note (Signed)
Cath Lab Visit (complete for each Cath Lab visit)  Clinical Evaluation Leading to the Procedure:   ACS: No.  Non-ACS:    Anginal Classification: CCS II  Anti-ischemic medical therapy: Maximal Therapy (2 or more classes of medications)  Non-Invasive Test Results: No non-invasive testing performed  Prior CABG: No previous CABG      History and Physical Interval Note:  05/01/2019 10:04 AM  [name]  has presented today for surgery, with the diagnosis of angina.  The various methods of treatment have been discussed with the patient and family. After consideration of risks, benefits and other options for treatment, the patient has consented to  Procedure(s): LEFT HEART CATH AND CORONARY ANGIOGRAPHY (N/A) as a surgical intervention.  The patient's history has been reviewed, patient examined, no change in status, stable for surgery.  I have reviewed the patient's chart and labs.  Questions were answered to the patient's satisfaction.     Larae Grooms

## 2019-05-01 NOTE — Discharge Instructions (Signed)
Radial Site Care ° °This sheet gives you information about how to care for yourself after your procedure. Your health care provider may also give you more specific instructions. If you have problems or questions, contact your health care provider. °What can I expect after the procedure? °After the procedure, it is common to have: °· Bruising and tenderness at the catheter insertion area. °Follow these instructions at home: °Medicines °· Take over-the-counter and prescription medicines only as told by your health care provider. °Insertion site care °· Follow instructions from your health care provider about how to take care of your insertion site. Make sure you: °? Wash your hands with soap and water before you change your bandage (dressing). If soap and water are not available, use hand sanitizer. °? Change your dressing as told by your health care provider. °? Leave stitches (sutures), skin glue, or adhesive strips in place. These skin closures may need to stay in place for 2 weeks or longer. If adhesive strip edges start to loosen and curl up, you may trim the loose edges. Do not remove adhesive strips completely unless your health care provider tells you to do that. °· Check your insertion site every day for signs of infection. Check for: °? Redness, swelling, or pain. °? Fluid or blood. °? Pus or a bad smell. °? Warmth. °· Do not take baths, swim, or use a hot tub until your health care provider approves. °· You may shower 24-48 hours after the procedure, or as directed by your health care provider. °? Remove the dressing and gently wash the site with plain soap and water. °? Pat the area dry with a clean towel. °? Do not rub the site. That could cause bleeding. °· Do not apply powder or lotion to the site. °Activity ° °· For 24 hours after the procedure, or as directed by your health care provider: °? Do not flex or bend the affected arm. °? Do not push or pull heavy objects with the affected arm. °? Do not  drive yourself home from the hospital or clinic. You may drive 24 hours after the procedure unless your health care provider tells you not to. °? Do not operate machinery or power tools. °· Do not lift anything that is heavier than 10 lb (4.5 kg), or the limit that you are told, until your health care provider says that it is safe. °· Ask your health care provider when it is okay to: °? Return to work or school. °? Resume usual physical activities or sports. °? Resume sexual activity. °General instructions °· If the catheter site starts to bleed, raise your arm and put firm pressure on the site. If the bleeding does not stop, get help right away. This is a medical emergency. °· If you went home on the same day as your procedure, a responsible adult should be with you for the first 24 hours after you arrive home. °· Keep all follow-up visits as told by your health care provider. This is important. °Contact a health care provider if: °· You have a fever. °· You have redness, swelling, or yellow drainage around your insertion site. °Get help right away if: °· You have unusual pain at the radial site. °· The catheter insertion area swells very fast. °· The insertion area is bleeding, and the bleeding does not stop when you hold steady pressure on the area. °· Your arm or hand becomes pale, cool, tingly, or numb. °These symptoms may represent a serious problem   that is an emergency. Do not wait to see if the symptoms will go away. Get medical help right away. Call your local emergency services (911 in the [place] Do not drive yourself to the hospital. °Summary °· After the procedure, it is common to have bruising and tenderness at the site. °· Follow instructions from your health care provider about how to take care of your radial site wound. Check the wound every day for signs of infection. °· Do not lift anything that is heavier than 10 lb (4.5 kg), or the limit that you are told, until your health care provider says  that it is safe. °This information is not intended to replace advice given to you by your health care provider. Make sure you discuss any questions you have with your health care provider. °Document Released: 12/01/2010 Document Revised: 12/04/2017 Document Reviewed: 12/04/2017 °Elsevier Interactive Patient Education © 2019 [employer] Inc[employer] ° °

## 2019-05-01 NOTE — Discharge Summary (Addendum)
Discharge Summary    Patient ID: HILTON SAEPHAN MRN: [id number]; DOB: 1941-08-30  Admit date: 05/01/2019 Discharge date: 05/01/2019  Primary Care Provider: Raina Mina., MD  Primary Cardiologist: Minus Breeding, MD   Discharge Diagnoses    Principal Problem:   Angina, class II ([place]) - New onset Active Problems:   Essential hypertension   CAD (coronary artery disease), native coronary artery   Hyperlipidemia with target LDL less than 70; statin intolerant.  Now on Repatha   Allergies Allergies  Allergen Reactions  . Statins     MD ORDERS UNSPECIFIED REACTION    . Augmentin [Amoxicillin-Pot Clavulanate] Nausea And Vomiting     Has patient had a PCN reaction causing immediate rash, facial/tongue/throat swelling, SOB or lightheadedness with hypotension: No Has patient had a PCN reaction causing severe rash involving mucus membranes or skin necrosis: No Has patient had a PCN reaction that required hospitalization No Has patient had a PCN reaction occurring within the last 10 years: No If all of the above answers are "NO", then may proceed with Cephalosporin use.   Marland Kitchen Dexamethasone Other (See Comments)    Frequent Urination [? HYPERGLYCEMIA? ]  . Sulfa Antibiotics Nausea And Vomiting    Diagnostic Studies/Procedures    CORONARY STENT INTERVENTION  LEFT HEART CATH AND CORONARY ANGIOGRAPHY  Conclusion    1st Mrg lesion is 99% stenosed. Essentially a CTO.  Mid LAD lesion is 50% stenosed.  2nd Diag lesion is 95% stenosed.  Scoring balloon angioplasty was performed using a BALLOON WOLVERINE 2.50X10.  Mid RCA lesion is 25% stenosed.  Mid Cx lesion is 25% stenosed.  Prox LAD to Mid LAD lesion is 25% stenosed.  LV end diastolic pressure is mildly elevated.  The left ventricular systolic function is normal.  The left ventricular ejection fraction is 55-65% by visual estimate.  There is moderate aortic valve stenosis.  Post intervention, there is a 25% residual  stenosis.   DAPT for 30 days at a minimum.  COnsider clopidogrel monotherapy given his diffuse disease.    Moderate aortic valve gradient.  Would get outpatient echocardiogram to further evaluate aortic valve stenosis.   Diagnostic Dominance: Right  Intervention        History of Present Illness     Anthony Hartman is a 78 y.o. male with a PMH most noted for CAD s/p PCI, shypertension, Diabetes mellitus type 2, hyperlipidemia,  recently initiated on PCSK9 and OSA on CPAP below who presented for cath.   Hx of CAD   2012 Cath - subtotal CTO of OM/I   Cardiac Cath 03/21/2017 (Dr. Chuck Hint - [place]): OM(RI) ~ CTO (unfixable PCI unable to cross with wire/balloon. pLAD 40%, ost D1 40%;  prox-mid RCA 15%.  Mild stenosis with small mid LCx.  EF 60%. ? Acute Inferior STEMI- Cardiac Cath -- November 2017: Mid LAD 30%, D2 50%.  OM 1/RI 100%, mid circumflex 30%.  Acute mid RCA 100% --> Integrity BMS 3.5 mm x 83m (3.8 mm) & 3.5 mm x 12 mm (4.1 mm) --BMS used because of need for back surgery.  EF 50% with inferobasal hypokinesis.  Echocardiogram June 19, 2018 ([facility]: Normal LV function/thickness.  EF 50 to 55%.  Normal wall motion.  Mild to moderate MAC.  Moderate to severe aortic stenosis (calcified).  Mild aortic insufficiency   Seen by Dr. HEllyn Hack6/10/20 with recurrent exertional chest pain that is probably class II in nature. Increased Imdur to 626mdaily. Recommended cath.  Hospital Course     Consultants: None  Cath as above. CTO of 1st Mrg lesion. 2nd Diag 95% s/p Scoring balloon angioplasty. No stent. DAPT for 30 days at a minimum. Consider clopidogrel monotherapy given his diffuse disease.   Moderate aortic valve gradient. Recommended outpatient echocardiogram. CRP II at [facility]. No complications. Felt good candidate for same day PCI.   The patient been seen by Dr. Irish Lack today and deemed ready for discharge home. All follow-up appointments have been  scheduled. Discharge medications are listed below.   Physical Exam  Constitutional: He is oriented to person, place, and time and well-developed, well-nourished, and in no distress.  HENT:  Head: Normocephalic and atraumatic.  Eyes: Pupils are equal, round, and reactive to light. EOM are normal.  Neck: Normal range of motion. Neck supple.  Cardiovascular: Normal rate and regular rhythm.  Radial TR band in place  Pulmonary/Chest: Effort normal and breath sounds normal.  Abdominal: Soft.  Musculoskeletal: Normal range of motion.  Neurological: He is alert and oriented to person, place, and time.  Skin: Skin is warm and dry.   Discharge Vitals Blood pressure 113/66, pulse 61, temperature 98.1 F (36.7 C), temperature source Skin, resp. rate 15, height _0  (1.93 m), weight 109.8 kg, SpO2 96 %.  Filed Weights   05/01/19 0844  Weight: 109.8 kg     Labs & Radiologic Studies    As above  Disposition   Pt is being discharged home today in good condition.  Follow-up Plans & Appointments    Follow-up Information    Minus Breeding, MD. Go on 05/25/2019.   Specialty: Cardiology Why: _1  for post hospital follow up  Contact information: [address] [place] [address] [phone]          Discharge Instructions    Amb Referral to Cardiac Rehabilitation   Complete by: As directed    Will send Cardia Rehab Phase 2 referral to [facility]   Diagnosis: PTCA   After initial evaluation and assessments completed: Virtual Based Care may be provided alone or in conjunction with Phase 2 Cardiac Rehab based on patient barriers.: Yes   Diet - low sodium heart healthy   Complete by: As directed    Discharge instructions   Complete by: As directed    No driving for 48 hours. No lifting over 5 lbs for 1 week. No sexual activity for 1 week.  Keep procedure site clean & dry. If you notice increased pain, swelling, bleeding or pus, call/return!  You may shower, but no  soaking baths/hot tubs/pools for 1 week.   Increase activity slowly   Complete by: As directed       Discharge Medications   Allergies as of 05/01/2019      Reactions   Statins    MD ORDERS UNSPECIFIED REACTION     Augmentin [amoxicillin-pot Clavulanate] Nausea And Vomiting   Has patient had a PCN reaction causing immediate rash, facial/tongue/throat swelling, SOB or lightheadedness with hypotension: No Has patient had a PCN reaction causing severe rash involving mucus membranes or skin necrosis: No Has patient had a PCN reaction that required hospitalization No Has patient had a PCN reaction occurring within the last 10 years: No If all of the above answers are "NO", then may proceed with Cephalosporin use.   Dexamethasone Other (See Comments)   Frequent Urination [? HYPERGLYCEMIA? ]   Sulfa Antibiotics Nausea And Vomiting      Medication List    TAKE these medications  acetaminophen 500 MG tablet Commonly known as: TYLENOL Take 500 mg by mouth every 6 (six) hours as needed (pain.).   Alirocumab 75 MG/ML Soaj Inject 1 mL into the skin every 14 (fourteen) days.   ALPRAZolam 1 MG tablet Commonly known as: XANAX Take 0.5 mg by mouth at bedtime.   aspirin EC 81 MG tablet Take 81 mg by mouth daily.   buPROPion 300 MG 24 hr tablet Commonly known as: WELLBUTRIN XL Take 300 mg by mouth daily.   carvedilol 6.25 MG tablet Commonly known as: COREG Take 6.25 mg by mouth 2 (two) times daily with a meal.   clopidogrel 75 MG tablet Commonly known as: Plavix Take 1 tablet (75 mg total) by mouth daily.   clopidogrel 75 MG tablet Commonly known as: PLAVIX Take 1 tablet (75 mg total) by mouth daily with breakfast. Further refills after discussion with Dr. Percival Spanish 7/13 Start taking on: May 02, 2019   famotidine 20 MG tablet Commonly known as: PEPCID Take 20 mg by mouth 2 (two) times daily as needed (acid reflux/indigestion.).   isosorbide mononitrate 60 MG 24 hr tablet  Commonly known as: IMDUR Take 1 tablet (60 mg total) by mouth daily.   losartan 25 MG tablet Commonly known as: COZAAR Take 1 tablet (25 mg total) by mouth daily.   Lubricant Eye Drops 0.4-0.3 % Soln Generic drug: Polyethyl Glycol-Propyl Glycol Place 1 drop into both eyes 3 (three) times daily as needed (dry/irritated eyes).   multivitamin with minerals Tabs tablet Take 1 tablet by mouth daily.   nitroGLYCERIN 0.4 MG SL tablet Commonly known as: NITROSTAT Place 1 tablet (0.4 mg total) under the tongue every 5 (five) minutes as needed for chest pain.   testosterone cypionate 200 MG/ML injection Commonly known as: DEPOTESTOSTERONE CYPIONATE Inject 120 mg into the muscle once a week.   traMADol 50 MG tablet Commonly known as: ULTRAM Take 50 mg by mouth every 6 (six) hours as needed (severe pain.).   valACYclovir 1000 MG tablet Commonly known as: VALTREX Take 1,000 mg by mouth 3 (three) times daily as needed (fever blisters).        Acute coronary syndrome (MI, NSTEMI, STEMI, etc) this admission?: No.    Outstanding Labs/Studies   None  Duration of Discharge Encounter   Greater than 30 minutes including physician time.  SignedCrista Luria Pablo, PA 05/01/2019, 1:11 PM   I have examined the patient and reviewed assessment and plan and discussed with patient.  Agree with above as stated.  COntinue DAPT for at least a month.  Would consider clopidogrel monotherapy given his degree of CAD.    Right wrist without hematoma.  OK for discharge.  F/u with Dr. Percival Spanish.   Larae Grooms

## 2019-05-01 NOTE — Progress Notes (Signed)
D/c instructions given to wife Anthony Hartman via telephone due to [place] restrictions.  All questions answered and Anthony Hartman verbalized understanding

## 2019-05-04 ENCOUNTER — Encounter (HOSPITAL_COMMUNITY): Payer: Self-pay | Admitting: Interventional Cardiology

## 2019-05-24 ENCOUNTER — Other Ambulatory Visit: Payer: Self-pay | Admitting: Cardiology

## 2019-05-24 NOTE — Progress Notes (Signed)
Cardiology Office Note   Date:  05/25/2019   ID:  Anthony Hartman, DOB [birth year], MRN [id number]  PCP:  Raina Mina., MD  Cardiologist:   Minus Breeding, MD   Chief Complaint  Patient presents with  . Coronary Artery Disease      History of Present Illness: Anthony Hartman is a 78 y.o. male who presents for evaluation of CAD.  In 2012 he had an occluded OM.  He managed medically as He did have an inferior MI in November 2017 and had a bare-metal stent to his right coronary artery.  This was all in [facility].   He had a cath in 2018 and he had an occluded ramus that they could not cross.  He was managed medically.  He presented last month with unstable angina and he had PCI of a diagonal. (See below.)     Since I last saw him he has done well.   I did review the cath with the patient and for this appt.  Since the cath he has not required any further NTG.  He is trying to do some activity.  The patient denies any new symptoms such as chest discomfort, neck or arm discomfort. There has been no new shortness of breath, PND or orthopnea. There have been no reported palpitations, presyncope or syncope.    Past Medical History:  Diagnosis Date  . Anxiety   . Arthritis   . Bladder cancer ([place])   . Bladder stone   . BPH (benign prostatic hypertrophy)   . Depression   . GERD (gastroesophageal reflux disease)   . History of bladder cancer    CARCINOMA IN SITU OF BLADDER  . History of kidney stones   . History of myocardial infarction    07-17-2011--   MI W/ OCCLUDED OM1. 2017 occluded RCA treated with a bare-metal stent, high-grade ramus intermediate stenosis in 2018 with failed attempted PCI  . Hyperlipidemia   . Hypertension   . Myocardial infarction [facility]) 2012; 2017   2012 - noted Subtotal CTO of OM/RI;  09/2016 - Acute Infer STEMI - BMS x 2 mRCA (3.5 mm x 15 & 3.5 x 12 - tapered 4.0 - 3.8 mm)  . OSA (obstructive sleep apnea)    cpap non-compliant   . Prostate cancer  [facility])     Past Surgical History:  Procedure Laterality Date  . ARTHRODESIS METATARSALPHALANGEAL JOINT (MTPJ) Right 02/20/2018   Procedure: Right Hallux Metatarsal Phalangeal Joint Arthrodesis; Right Silver Bunionectomy;  Surgeon: Wylene Simmer, MD;  Location: [place];  Service: Orthopedics;  Laterality: Right;  . BACK SURGERY    . CARDIOVASCULAR STRESS TEST  04/22/11  . CORONARY ANGIOPLASTY  03/2017    failed PCI RI (OM1) 2018 -- similar to 2012.  . CORONARY STENT INTERVENTION  09/2016   [facility] (Acute Inferior STEMI)- Acute mid RCA 100% --> Integrity BMS 3.5 mm x 42m (3.8 mm) & 3.5 mm x 12 mm (4.1 mm) --BMS used because of need for back surgery  . CORONARY STENT INTERVENTION N/A 05/01/2019   Procedure: CORONARY STENT INTERVENTION;  Surgeon: VJettie Booze MD;  [facility];  Service: Cardiovascular;  Laterality: N/A;  . CYSTO/ BLADDER BX'S  X2  2001  / X1  2003  . CYSTO/ BLADDER BX'S/ BILATERAL RETROGRADE URETEROPYLEGRAM  2003; 2007; 2008;  12-30-2007   CARCINOMA IN SITU OF BLADDER  . CYSTOSCOPY WITH LITHOLAPAXY N/A 05/07/2013   Procedure:  CYSTOSCOPY WITH LITHOLAPAXY;  Surgeon: Franchot Gallo, MD;  Location: [facility];  Service: Urology;  Laterality: N/A;  . EHL Lengthening Right 05/10/2017   RT FOOT  . Hammer Toe Repair Right 05/10/2017   Rt #2 Toe  . HAMMERTOE RECONSTRUCTION WITH WEIL OSTEOTOMY Right 02/20/2018   Procedure: Right Second Metatarsal Priscille Heidelberg and Revision Hammertoe Correction;  Surgeon: Wylene Simmer, MD;  Location: [place];  Service: Orthopedics;  Laterality: Right;  . LEFT HEART CATH AND CORONARY ANGIOGRAPHY  07/17/2011   OCCLUDED OM1 WITH UNSUCCESSFUL PCI ATTEMPT, O/W MILD TO MODERATE DISEASE, DR ANDY CHIU ([facility])    . LEFT HEART CATH AND CORONARY ANGIOGRAPHY  09/2016   Acute Inferior STEMI ([facility])  - Mid LAD 30%, D2 50%.  OM 1/RI 100%, mid circumflex 30%.  Acute mRCA  100% (thrombotic) - BMS PCI.  EF 50% with inferobasal hypokinesis.  Marland Kitchen LEFT HEART CATH AND CORONARY ANGIOGRAPHY  03/2017   (Dr. Chuck Hint - [place]): OM(RI) ~ CTO (unfixable PCI unable to cross with wire/balloon. pLAD 40%, ost D1 40%;  prox-mid RCA 15%.  Mild stenosis with small mid LCx.  EF 60%.  Marland Kitchen LEFT HEART CATH AND CORONARY ANGIOGRAPHY N/A 05/01/2019   Procedure: LEFT HEART CATH AND CORONARY ANGIOGRAPHY;  Surgeon: Jettie Booze, MD;  Location: [place] [facility];  Service: Cardiovascular;  Laterality: N/A;  . LUMBAR DECOMPRESSION FORAMINOTOMY L3 -- L5/  REMOVAL CYST L4-5 FACET JOINT  08-21-2009   ALSO HAD PRIOR BACK SURG IN 1992  . LUMBAR LAMINECTOMY  04/04/2017   L2-3  . LUMBAR LAMINECTOMY/DECOMPRESSION MICRODISCECTOMY N/A 04/04/2017   Procedure: Laminectomy and Foraminotomy - Lumbar Two-Three;  Surgeon: Eustace Moore, MD;  Location: [place];  Service: Neurosurgery;  Laterality: N/A;  . OPEN TENOTOMY OF FLEXOR 2ND AND 3RD RIGHT TOES  APRIL 2008  . PROSTATE BIOPSY    . TRANSTHORACIC ECHOCARDIOGRAM  07/17/2011   at [facility], MILD TO MODERATE CONCENTRIC LVH/ NORMAL LVSF/ EF 55-60%/ MILD AORTIC STENOSIS  . TRANSTHORACIC ECHOCARDIOGRAM  06/2018   ([place]): Normal LV function/thickness.  EF 50 to 55%.  Normal wall motion.  Mild to moderate MAC.  Moderate to Severe Aortic Stenosis (calcified).  Mild aortic insufficiency.  . TRANSURETHRAL RESECTION OF BLADDER TUMOR  01-10-2004  . TRANSURETHRAL RESECTION OF PROSTATE N/A 05/07/2013   Procedure: TRANSURETHRAL RESECTION OF THE PROSTATE WITH GYRUS INSTRUMENTS;  Surgeon: Franchot Gallo, MD;  Location[facility];  Service: Urology;  Laterality: N/A;     Current Outpatient Medications  Medication Sig Dispense Refill  . acetaminophen (TYLENOL) 500 MG tablet Take 500 mg by mouth every 6 (six) hours as needed (pain.).    Marland Kitchen Alirocumab 75 MG/ML SOAJ Inject 1 mL into the skin every 14 (fourteen) days.    . ALPRAZolam (XANAX) 1 MG tablet  Take 0.5 mg by mouth at bedtime.    Marland Kitchen buPROPion (WELLBUTRIN XL) 300 MG 24 hr tablet Take 300 mg by mouth daily.    . carvedilol (COREG) 6.25 MG tablet Take 6.25 mg by mouth 2 (two) times daily with a meal.    . clopidogrel (PLAVIX) 75 MG tablet Take 1 tablet (75 mg total) by mouth daily with breakfast. Further refills after discussion with Dr. Percival Spanish 7/13 30 tablet 0  . famotidine (PEPCID) 20 MG tablet Take 20 mg by mouth 2 (two) times daily as needed (acid reflux/indigestion.).     Marland Kitchen isosorbide mononitrate (IMDUR) 60 MG 24 hr tablet Take  1 tablet (60 mg total) by mouth daily. 90 tablet 3  . Multiple Vitamin (MULTIVITAMIN WITH MINERALS) TABS tablet Take 1 tablet by mouth daily.    . nitroGLYCERIN (NITROSTAT) 0.4 MG SL tablet Place 1 tablet (0.4 mg total) under the tongue every 5 (five) minutes as needed for chest pain. 25 tablet 3  . Polyethyl Glycol-Propyl Glycol (LUBRICANT EYE DROPS) 0.4-0.3 % SOLN Place 1 drop into both eyes 3 (three) times daily as needed (dry/irritated eyes).    Marland Kitchen testosterone cypionate (DEPOTESTOSTERONE CYPIONATE) 200 MG/ML injection Inject 120 mg into the muscle once a week.    . traMADol (ULTRAM) 50 MG tablet Take 50 mg by mouth every 6 (six) hours as needed (severe pain.).     Marland Kitchen valACYclovir (VALTREX) 1000 MG tablet Take 1,000 mg by mouth 3 (three) times daily as needed (fever blisters).     . losartan (COZAAR) 25 MG tablet Take 1 tablet (25 mg total) by mouth daily. 90 tablet 1   No current facility-administered medications for this visit.     Allergies:   Statins, Augmentin [amoxicillin-pot clavulanate], Dexamethasone, and Sulfa antibiotics     ROS:  Please see the history of present illness.   Otherwise, review of systems are positive for none.   All other systems are reviewed and negative.    PHYSICAL EXAM: VS:  BP (!) 142/70   Pulse (!) 59   Temp 98.2 F (36.8 C)   Ht _0  (1.93 m)   Wt 240 lb (108.9 kg)   SpO2 95%   BMI 29.21 kg/m  , BMI Body mass  index is 29.21 kg/m. GENERAL:  Well appearing NECK:  No jugular venous distention, waveform within normal limits, carotid upstroke brisk and symmetric, no bruits, no thyromegaly LUNGS:  Clear to auscultation bilaterally CHEST:  Unremarkable HEART:  PMI not displaced or sustained,S1 and S2 within normal limits, no S3, no S4, no clicks, no rubs, 2/6 apical systolic murmur, no diastolic murmurs ABD:  Flat, positive bowel sounds normal in frequency in pitch, no bruits, no rebound, no guarding, no midline pulsatile mass, no hepatomegaly, no splenomegaly EXT:  2 plus pulses throughout, no edema, no cyanosis no clubbing SKIN:  No rashes no nodules   EKG:  EKG is not ordered today.   Recent Labs: 04/22/2019: BUN 14; Creatinine, Ser 1.01; Hemoglobin 14.3; Platelets 245; Potassium 4.6; Sodium 140    Lipid Panel No results found for: CHOL, TRIG, HDL, CHOLHDL, VLDL, LDLCALC, LDLDIRECT    Wt Readings from Last 3 Encounters:  05/25/19 240 lb (108.9 kg)  05/01/19 242 lb (109.8 kg)  04/22/19 242 lb (109.8 kg)      Other studies Reviewed: Additional studies/ records that were reviewed today include: Cath . Review of the above records demonstrates:  Please see elsewhere in the note.    Intervention     ASSESSMENT AND PLAN:  CAD:   He can stop the ASA and continue Plavix alone.  We discussed whether he wants to continue isosorbide mononitrate.  I suggested that he do this but he wants to minimize pills but ultimately he is going to continue.  Otherwise no change in therapy.  HTN: Blood pressure slightly elevated.  However, this is unusual.  No change in medications.  DYSLIPIDEMIA: I will check a lipid profile.  AS: I reviewed her previous echocardiogram in 2019 and his prior physicians.  Does have a slight systolic murmur which is aortic stenosis.  This is very mild.  I will follow  this clinically.   Current medicines are reviewed at length with the patient today.  The patient does  not have concerns regarding medicines.  The following changes have been made:  As above.   Labs/ tests ordered today include:   Orders Placed This Encounter  Procedures  . Lipid panel     Disposition:   FU with me in six months.     Signed, Minus Breeding, MD  05/25/2019 10:10 AM    [place]

## 2019-05-25 ENCOUNTER — Other Ambulatory Visit: Payer: Self-pay

## 2019-05-25 ENCOUNTER — Encounter: Payer: Self-pay | Admitting: Cardiology

## 2019-05-25 ENCOUNTER — Ambulatory Visit: Payer: Medicare HMO | Admitting: Cardiology

## 2019-05-25 VITALS — BP 142/70 | HR 59 | Temp 98.2°F | Ht 76.0 in | Wt 240.0 lb

## 2019-05-25 DIAGNOSIS — I10 Essential (primary) hypertension: Secondary | ICD-10-CM

## 2019-05-25 DIAGNOSIS — I350 Nonrheumatic aortic (valve) stenosis: Secondary | ICD-10-CM | POA: Diagnosis not present

## 2019-05-25 DIAGNOSIS — I25119 Atherosclerotic heart disease of native coronary artery with unspecified angina pectoris: Secondary | ICD-10-CM

## 2019-05-25 DIAGNOSIS — E785 Hyperlipidemia, unspecified: Secondary | ICD-10-CM | POA: Diagnosis not present

## 2019-05-25 MED ORDER — LOSARTAN POTASSIUM 25 MG PO TABS: 25.0000 mg | ORAL_TABLET | Freq: Every day | ORAL | 1 refills | Status: DC

## 2019-05-25 NOTE — Patient Instructions (Signed)
Medication Instructions:  STOP ASPIRIN   If you need a refill on your cardiac medications before your next appointment, please call your pharmacy.   Lab work: [place]   If you have labs (blood work) drawn today and your tests are completely normal, you will receive your results only by: Marland Kitchen MyChart Message (if you have MyChart) OR . A paper copy in the mail If you have any lab test that is abnormal or we need to change your treatment, we will call you to review the results.  Testing/Procedures: NONE  Follow-Up: At [facility], you and your health needs are our priority.  As part of our continuing mission to provide you with exceptional heart care, we have created designated Provider Care Teams.  These Care Teams include your primary Cardiologist (physician) and Advanced Practice Providers (APPs -  Physician Assistants and Nurse Practitioners) who all work together to provide you with the care you need, when you need it. You will need a follow up appointment in 6 months.  Please call our office 2 months in advance to schedule this appointment.  You may see Minus Breeding, MD or one of the following Advanced Practice Providers on your designated Care Team:   Rosaria Ferries, PA-C . Jory Sims, DNP, ANP

## 2019-05-26 LAB — LIPID PANEL
Chol/HDL Ratio: 3.4 ratio (ref 0.0–5.0)
Cholesterol, Total: 165 mg/dL (ref 100–199)
HDL: 48 mg/dL (ref >39)
LDL Calculated: 95 mg/dL (ref 0–99)
Triglycerides: 111 mg/dL (ref 0–149)
VLDL Cholesterol Cal: 22 mg/dL (ref 5–40)

## 2019-06-03 ENCOUNTER — Other Ambulatory Visit: Payer: Self-pay | Admitting: Interventional Cardiology

## 2019-06-09 ENCOUNTER — Other Ambulatory Visit: Payer: Self-pay

## 2019-06-09 MED ORDER — EZETIMIBE 10 MG PO TABS: 10.0000 mg | ORAL_TABLET | Freq: Every day | ORAL | 3 refills | Status: DC

## 2019-07-10 ENCOUNTER — Encounter: Payer: Self-pay | Admitting: *Deleted

## 2019-07-10 NOTE — Telephone Encounter (Signed)
This encounter was created in error - please disregard.

## 2019-07-23 ENCOUNTER — Other Ambulatory Visit: Payer: Self-pay | Admitting: Cardiology

## 2019-07-23 MED ORDER — CLOPIDOGREL BISULFATE 75 MG PO TABS: 75.0000 mg | ORAL_TABLET | Freq: Every day | ORAL | 0 refills | Status: DC

## 2019-07-23 NOTE — Telephone Encounter (Signed)
New Message    *STAT* If patient is at the pharmacy, call can be transferred to refill team.   1. Which medications need to be refilled? (please list name of each medication and dose if known) clopidogrel (PLAVIX) 75 MG tablet    2. Which pharmacy/location (including street and city if local pharmacy) is medication to be sent to? [id number] - [place], [place] [address]  3. Do they need a 30 day or 90 day supply? [place]

## 2019-07-23 NOTE — Telephone Encounter (Signed)
Requested Prescriptions   Signed Prescriptions Disp Refills  . clopidogrel (PLAVIX) 75 MG tablet 90 tablet 0    Sig: Take 1 tablet (75 mg total) by mouth daily.    Authorizing Provider: Minus Breeding    Ordering User: NEWCOMER MCCLAIN, Jalon Blackwelder L  \

## 2019-10-17 ENCOUNTER — Other Ambulatory Visit: Payer: Self-pay | Admitting: Cardiology

## 2019-11-12 ENCOUNTER — Ambulatory Visit: Payer: Medicare HMO | Attending: Internal Medicine

## 2019-11-12 DIAGNOSIS — R238 Other skin changes: Secondary | ICD-10-CM

## 2019-11-12 DIAGNOSIS — U071 Other skin changes: Secondary | ICD-10-CM

## 2019-11-13 ENCOUNTER — Other Ambulatory Visit: Payer: Self-pay | Admitting: Cardiology

## 2019-11-13 LAB — NOVEL CORONAVIRUS, NAA: SARS-CoV-2, NAA: NOT DETECTED

## 2019-11-16 ENCOUNTER — Telehealth: Payer: Self-pay | Admitting: Cardiology

## 2019-11-16 NOTE — Telephone Encounter (Signed)
  Patient is calling because he has some concerns about his cholesterol medications. He says he can't take statin drugs and would like to discuss this at his next appt on 11/23/19.

## 2019-11-16 NOTE — Telephone Encounter (Signed)
Attempted to reach the patient. The line was busy.

## 2019-11-17 ENCOUNTER — Telehealth: Payer: Self-pay

## 2019-11-17 DIAGNOSIS — R05 Cough: Secondary | ICD-10-CM | POA: Diagnosis not present

## 2019-11-17 DIAGNOSIS — Z20822 Contact with and (suspected) exposure to covid-19: Secondary | ICD-10-CM | POA: Diagnosis not present

## 2019-11-17 DIAGNOSIS — J069 Acute upper respiratory infection, unspecified: Secondary | ICD-10-CM | POA: Diagnosis not present

## 2019-11-17 DIAGNOSIS — F418 Other specified anxiety disorders: Secondary | ICD-10-CM | POA: Diagnosis not present

## 2019-11-17 DIAGNOSIS — F5101 Primary insomnia: Secondary | ICD-10-CM | POA: Diagnosis not present

## 2019-11-17 DIAGNOSIS — Z87891 Personal history of nicotine dependence: Secondary | ICD-10-CM | POA: Diagnosis not present

## 2019-11-17 DIAGNOSIS — Z79899 Other long term (current) drug therapy: Secondary | ICD-10-CM | POA: Diagnosis not present

## 2019-11-17 NOTE — Telephone Encounter (Signed)
Pt notified of negative COVID-19 results. Understanding verbalized.  He states that he was started to not feel so well-more congested and diarrhea. Will have a nurse call him back.   Voices understanding.   [place]

## 2019-11-17 NOTE — Telephone Encounter (Signed)
OK.  He can stop and we can discuss in six days.

## 2019-11-17 NOTE — Telephone Encounter (Signed)
ret'd call to pt.  Reported he was tested for COVID due to exposure.  Stated he had onset of symptoms about 12/27; tested negative on 12/31.   Reported intermittent cough with yellow phlegm production, sinus congestion, and diarrhea.  Reported having 1-2 stools / day for past week.  Advised because of symptoms, he should follow guidelines for isolation, as if he had tested positive.  Advised pt. that he should quarantine x 10 days from onset of symptoms, and also show improvement in resp. Symptoms, and be fever-free x 3 consecutive days without use of antipyretics, prior to coming out of quarantine.  Advised importance of staying isolated from his wife, during quarantine.  Advised on resting and staying well hydrated.  Informed of symptoms of dehydration to monitor for.  Also advised to call his PCP, today, and report his symptoms.  Pt. Verb. Understanding.  Agreed to call his PCP.  All questions answered.

## 2019-11-17 NOTE — Telephone Encounter (Signed)
Left detailed message will let Dr Percival Spanish know that pt wishes to discuss cholesterol meds at upcoming appt./cy

## 2019-11-17 NOTE — Telephone Encounter (Signed)
Advised patient, verbalized understanding  

## 2019-11-18 ENCOUNTER — Telehealth: Payer: Self-pay | Admitting: Cardiology

## 2019-11-18 NOTE — Telephone Encounter (Signed)
New Message:     Pt is taking COVID Vaccine tomorrow and need to know if he is taking a blood thinner please.

## 2019-11-18 NOTE — Telephone Encounter (Signed)
Pt is getting his COVID-19 vaccine tomorrow, called back to discuss. Pt's mailbox was full so unable to leave a message at this time. Will try back later.

## 2019-11-22 DIAGNOSIS — Z7189 Other specified counseling: Secondary | ICD-10-CM | POA: Insufficient documentation

## 2019-11-22 DIAGNOSIS — I350 Nonrheumatic aortic (valve) stenosis: Secondary | ICD-10-CM | POA: Insufficient documentation

## 2019-11-22 NOTE — Progress Notes (Signed)
Cardiology Office Note   Date:  11/23/2019   ID:  Anthony Hartman, DOB 06-09-1941, MRN [id number]  PCP:  Myrlene Broker, MD  Cardiologist:   Minus Breeding, MD   Chief Complaint  Patient presents with  . Coronary Artery Disease      History of Present Illness: Anthony Hartman is a 80 y.o. male who presents for evaluation of CAD.  In 2012 he had an occluded OM.  He managed medically as He did have an inferior MI in November 2017 and had a bare-metal stent to his right coronary artery.  This was all in [facility].   He had a cath in 2018 and he had an occluded ramus that they could not cross.  He was managed medically.  He presented in June with unstable angina and he had PCI of a diagonal. He called recently and said he was unable to tolerate his Zetia.  He returns for follow up.    He has done quite well.  He is mostly limited by back pain.  However, he has not had any chest pressure, neck or arm discomfort.  He has not had any new shortness of breath, PND or orthopnea.  He has had no weight gain or edema.  He has no palpitations, presyncope or syncope.  He has had no new weight gain or edema.   Past Medical History:  Diagnosis Date  . Anxiety   . Arthritis   . Bladder cancer ([place])   . Bladder stone   . BPH (benign prostatic hypertrophy)   . Depression   . GERD (gastroesophageal reflux disease)   . History of bladder cancer    CARCINOMA IN SITU OF BLADDER  . History of kidney stones   . History of myocardial infarction    07-17-2011--   MI W/ OCCLUDED OM1. 2017 occluded RCA treated with a bare-metal stent, high-grade ramus intermediate stenosis in 2018 with failed attempted PCI  . Hyperlipidemia   . Hypertension   . Myocardial infarction [facility]) 2012; 2017   2012 - noted [place] CTO of OM/RI;  09/2016 - Acute Infer STEMI - BMS x 2 mRCA (3.5 mm x 15 & 3.5 x 12 - tapered 4.0 - 3.8 mm)  . OSA (obstructive sleep apnea)    cpap non-compliant   . Prostate cancer [facility])      Past Surgical History:  Procedure Laterality Date  . ARTHRODESIS METATARSALPHALANGEAL JOINT (MTPJ) Right 02/20/2018   Procedure: Right Hallux Metatarsal Phalangeal Joint Arthrodesis; Right Silver Bunionectomy;  Surgeon: Wylene Simmer, MD;  Location: [place];  Service: Orthopedics;  Laterality: Right;  . BACK SURGERY    . CARDIOVASCULAR STRESS TEST  04/22/11  . CORONARY ANGIOPLASTY  03/2017    failed PCI RI (OM1) 2018 -- similar to 2012.  . CORONARY STENT INTERVENTION  09/2016   [facility] (Acute Inferior STEMI)- Acute mid RCA 100% --> Integrity BMS 3.5 mm x 40m (3.8 mm) & 3.5 mm x 12 mm (4.1 mm) --BMS used because of need for back surgery  . CORONARY STENT INTERVENTION N/A 05/01/2019   Procedure: CORONARY STENT INTERVENTION;  Surgeon: VJettie Booze MD;  [place];  Service: Cardiovascular;  Laterality: N/A;  . CYSTO/ BLADDER BX'S  X2  2001  / X1  2003  . CYSTO/ BLADDER BX'S/ BILATERAL RETROGRADE URETEROPYLEGRAM  2003; 2007; 2008;  12-30-2007   CARCINOMA IN SITU OF BLADDER  . CYSTOSCOPY WITH LITHOLAPAXY N/A 05/07/2013  Procedure: CYSTOSCOPY WITH LITHOLAPAXY;  Surgeon: Franchot Gallo, MD;  Location: [facility];  Service: Urology;  Laterality: N/A;  . EHL Lengthening Right 05/10/2017   RT FOOT  . Hammer Toe Repair Right 05/10/2017   Rt #2 Toe  . HAMMERTOE RECONSTRUCTION WITH WEIL OSTEOTOMY Right 02/20/2018   Procedure: Right Second Metatarsal Priscille [place]idelberg and Revision Hammertoe Correction;  Surgeon: Wylene Simmer, MD;  Location: [place];  Service: Orthopedics;  Laterality: Right;  . LEFT HEART CATH AND CORONARY ANGIOGRAPHY  07/17/2011   OCCLUDED OM1 WITH UNSUCCESSFUL PCI ATTEMPT, O/W MILD TO MODERATE DISEASE, DR ANDY CHIU ([facility])    . LEFT HEART CATH AND CORONARY ANGIOGRAPHY  09/2016   Acute Inferior STEMI ([facility])  - Mid LAD 30%, D2 50%.  OM 1/RI 100%, mid circumflex 30%.  Acute mRCA 100%  (thrombotic) - BMS PCI.  EF 50% with inferobasal hypokinesis.  Marland Kitchen LEFT HEART CATH AND CORONARY ANGIOGRAPHY  03/2017   (Dr. Chuck Hint - [facility]): OM(RI) ~ CTO (unfixable PCI unable to cross with wire/balloon. pLAD 40%, ost D1 40%;  prox-mid RCA 15%.  Mild stenosis with small mid LCx.  EF 60%.  Marland Kitchen LEFT HEART CATH AND CORONARY ANGIOGRAPHY N/A 05/01/2019   Procedure: LEFT HEART CATH AND CORONARY ANGIOGRAPHY;  Surgeon: Jettie Booze, MD;  Location: [place] [facility];  Service: Cardiovascular;  Laterality: N/A;  . LUMBAR DECOMPRESSION FORAMINOTOMY L3 -- L5/  REMOVAL CYST L4-5 FACET JOINT  08-21-2009   ALSO HAD PRIOR BACK SURG IN 1992  . LUMBAR LAMINECTOMY  04/04/2017   L2-3  . LUMBAR LAMINECTOMY/DECOMPRESSION MICRODISCECTOMY N/A 04/04/2017   Procedure: Laminectomy and Foraminotomy - Lumbar Two-Three;  Surgeon: Eustace Moore, MD;  Location: [place];  Service: Neurosurgery;  Laterality: N/A;  . OPEN TENOTOMY OF FLEXOR 2ND AND 3RD RIGHT TOES  APRIL 2008  . PROSTATE BIOPSY    . TRANSTHORACIC ECHOCARDIOGRAM  07/17/2011   at [facility], MILD TO MODERATE CONCENTRIC LVH/ NORMAL LVSF/ EF 55-60%/ MILD AORTIC STENOSIS  . TRANSTHORACIC ECHOCARDIOGRAM  06/2018   ([place]): Normal LV function/thickness.  EF 50 to 55%.  Normal wall motion.  Mild to moderate MAC.  Moderate to Severe Aortic Stenosis (calcified).  Mild aortic insufficiency.  . TRANSURETHRAL RESECTION OF BLADDER TUMOR  01-10-2004  . TRANSURETHRAL RESECTION OF PROSTATE N/A 05/07/2013   Procedure: TRANSURETHRAL RESECTION OF THE PROSTATE WITH GYRUS INSTRUMENTS;  Surgeon: Franchot Gallo, MD;  Location[facility];  Service: Urology;  Laterality: N/A;     Current Outpatient Medications  Medication Sig Dispense Refill  . acetaminophen (TYLENOL) 500 MG tablet Take 500 mg by mouth every 6 (six) hours as needed (pain.).    Marland Kitchen ALPRAZolam (XANAX) 1 MG tablet Take 0.5 mg by mouth at bedtime.    Marland Kitchen buPROPion (WELLBUTRIN XL) 300 MG 24 hr tablet Take  300 mg by mouth daily.    . carvedilol (COREG) 6.25 MG tablet Take 6.25 mg by mouth 2 (two) times daily with a meal.    . clopidogrel (PLAVIX) 75 MG tablet TAKE 1 TABLET BY MOUTH EVERY DAY 90 tablet 2  . famotidine (PEPCID) 20 MG tablet Take 20 mg by mouth 2 (two) times daily as needed (acid reflux/indigestion.).     Marland Kitchen HYDROcodone-acetaminophen (NORCO/VICODIN) 5-325 MG tablet Take 1 tablet by mouth 3 (three) times daily as needed.    . isosorbide mononitrate (IMDUR) 60 MG 24 hr tablet Take 1 tablet (60 mg total) by mouth daily. [place]  tablet 3  . losartan (COZAAR) 25 MG tablet TAKE 1 TABLET BY MOUTH EVERY DAY 90 tablet 1  . Multiple Vitamin (MULTIVITAMIN WITH MINERALS) TABS tablet Take 1 tablet by mouth daily.    . nitroGLYCERIN (NITROSTAT) 0.4 MG SL tablet Place 1 tablet (0.4 mg total) under the tongue every 5 (five) minutes as needed for chest pain. 25 tablet 3  . Polyethyl Glycol-Propyl Glycol (LUBRICANT EYE DROPS) 0.4-0.3 % SOLN Place 1 drop into both eyes 3 (three) times daily as needed (dry/irritated eyes).    Marland Kitchen testosterone cypionate (DEPOTESTOSTERONE CYPIONATE) 200 MG/ML injection Inject 120 mg into the muscle once a week.    . traMADol (ULTRAM) 50 MG tablet Take 50 mg by mouth every 6 (six) hours as needed (severe pain.).     Marland Kitchen valACYclovir (VALTREX) 1000 MG tablet Take 1,000 mg by mouth 3 (three) times daily as needed (fever blisters).     . Alirocumab (PRALUENT) 150 MG/ML SOAJ Inject 150 mg into the skin every 14 (fourteen) days. 2 pen 11   No current facility-administered medications for this visit.    Allergies:   Statins, Augmentin [amoxicillin-pot clavulanate], Dexamethasone, and Sulfa antibiotics     ROS:  Please see the history of present illness.   Otherwise, review of systems are positive for none.   All other systems are reviewed and negative.    PHYSICAL EXAM: VS:  BP 122/66   Pulse 61   Ht _0  (1.93 m)   Wt 240 lb (108.9 kg)   SpO2 97%   BMI 29.21 kg/m  , BMI Body  mass index is 29.21 kg/m. GENERAL:  Well appearing NECK:  No jugular venous distention, waveform within normal limits, carotid upstroke brisk and symmetric, no bruits, no thyromegaly LUNGS:  Clear to auscultation bilaterally CHEST:  Unremarkable HEART:  PMI not displaced or sustained,S1 and S2 within normal limits, no S3, no S4, no clicks, no rubs, 2 out of 6 apical systolic murmur radiating slightly at the aortic outflow tract, no diastolic murmurs ABD:  Flat, positive bowel sounds normal in frequency in pitch, no bruits, no rebound, no guarding, no midline pulsatile mass, no hepatomegaly, no splenomegaly EXT:  2 plus pulses throughout, no edema, no cyanosis no clubbing   EKG:  EKG is  ordered today. Normal sinus rhythm, rate 61deep inferolateral T wave inversions new compared to the preprocedure EKG in June.  Recent Labs: 04/22/2019: BUN 14; Creatinine, Ser 1.01; Hemoglobin 14.3; Platelets 245; Potassium 4.6; Sodium 140    Lipid Panel    Component Value Date/Time   CHOL 165 05/25/2019 0929   TRIG 111 05/25/2019 0929   HDL 48 05/25/2019 0929   CHOLHDL 3.4 05/25/2019 0929   LDLCALC 95 05/25/2019 0929      Wt Readings from Last 3 Encounters:  11/23/19 240 lb (108.9 kg)  05/25/19 240 lb (108.9 kg)  05/01/19 242 lb (109.8 kg)      Other studies Reviewed: Additional studies/ records that were reviewed today include:  Review of the above records demonstrates:  Please see elsewhere in the note.    Intervention     ASSESSMENT AND PLAN:  CAD:   The patient has coronary disease as described but no symptoms.  His EKG is abnormal but we did not have a post procedure EKG and I suspect this is his new normal.  I gave him a copy of this.  No change in therapy.  He wants to go back to cardiac rehab in [facility]  and I think that is fine.  HTN: The blood pressure is controlled.  No change in therapy.  DYSLIPIDEMIA:   He was not quite at target with an LDL of 95.  Were going  to increase his Praluent.   He was seen by our Pharm D as part of this visit.   AS:   This was mild.  No change in therapy.  COVID EDUCATION: He has already had part one of the vaccine.   Current medicines are reviewed at length with the patient today.  The patient does not have concerns regarding medicines.  The following changes have been made: As above  Labs/ tests ordered today include:.  Orders Placed This Encounter  Procedures  . EKG 12-Lead     Disposition:   FU with me in 6 months.     Signed, Minus Breeding, MD  11/23/2019 12:22 PM    [facility]

## 2019-11-23 ENCOUNTER — Ambulatory Visit: Payer: Medicare HMO | Admitting: Cardiology

## 2019-11-23 ENCOUNTER — Telehealth: Payer: Self-pay

## 2019-11-23 ENCOUNTER — Other Ambulatory Visit: Payer: Self-pay

## 2019-11-23 ENCOUNTER — Other Ambulatory Visit: Payer: Self-pay | Admitting: Pharmacist

## 2019-11-23 ENCOUNTER — Encounter: Payer: Self-pay | Admitting: Cardiology

## 2019-11-23 VITALS — BP 122/66 | HR 61 | Ht 76.0 in | Wt 240.0 lb

## 2019-11-23 DIAGNOSIS — E785 Hyperlipidemia, unspecified: Secondary | ICD-10-CM

## 2019-11-23 DIAGNOSIS — I2510 Atherosclerotic heart disease of native coronary artery without angina pectoris: Secondary | ICD-10-CM | POA: Diagnosis not present

## 2019-11-23 DIAGNOSIS — Z86010 Personal history of colonic polyps: Secondary | ICD-10-CM | POA: Diagnosis not present

## 2019-11-23 DIAGNOSIS — I350 Nonrheumatic aortic (valve) stenosis: Secondary | ICD-10-CM | POA: Diagnosis not present

## 2019-11-23 DIAGNOSIS — R197 Diarrhea, unspecified: Secondary | ICD-10-CM | POA: Diagnosis not present

## 2019-11-23 DIAGNOSIS — I10 Essential (primary) hypertension: Secondary | ICD-10-CM | POA: Diagnosis not present

## 2019-11-23 DIAGNOSIS — Z7189 Other specified counseling: Secondary | ICD-10-CM

## 2019-11-23 MED ORDER — REPATHA SURECLICK 140 MG/ML ~~LOC~~ SOAJ: 140.0000 mg | SUBCUTANEOUS | 11 refills | Status: DC

## 2019-11-23 MED ORDER — PRALUENT 150 MG/ML ~~LOC~~ SOAJ: 150.0000 mg | SUBCUTANEOUS | 11 refills | Status: DC

## 2019-11-23 NOTE — Patient Instructions (Addendum)
Medication Instructions:  STOP ZETIA INCREASE ALIROCUMAB TO 150MG  EVERY 14 DAYS *If you need a refill on your cardiac medications before your next appointment, please call your pharmacy*  Lab Work: NONE ORDERED  Testing/Procedures: NONE ORDERED  Follow-Up: At [facility], you and your health needs are our priority.  As part of our continuing mission to provide you with exceptional heart care, we have created designated Provider Care Teams.  These Care Teams include your primary Cardiologist (physician) and Advanced Practice Providers (APPs -  Physician Assistants and Nurse Practitioners) who all work together to provide you with the care you need, when you need it.  Your next appointment:   6 month(s)  The format for your next appointment:   In Person  Provider:   Minus Breeding, MD

## 2019-11-23 NOTE — Telephone Encounter (Signed)
Called and let the pt know that they are approved for repatha, rx sent, [employer] approved, sent confirmaton email to the pt, pt voiced understanding

## 2019-11-26 ENCOUNTER — Telehealth: Payer: Self-pay | Admitting: Cardiology

## 2019-11-26 NOTE — Telephone Encounter (Signed)
[facility] mail order pharmacy calling stating that they would like someone to give them a call back at [phone], Ref# [id number]. Regarding pt's medications. Please address

## 2019-12-04 NOTE — Telephone Encounter (Signed)
I called the pharmacy concerning the message that they left and the pharmacy stated that the pt's medication Repatha prior auth was approved from 11/23/19 to 01/22/20. If you have any other questions to please give them a call back.

## 2019-12-05 ENCOUNTER — Other Ambulatory Visit: Payer: Self-pay | Admitting: Cardiology

## 2019-12-07 ENCOUNTER — Telehealth: Payer: Self-pay | Admitting: Cardiology

## 2019-12-07 NOTE — Telephone Encounter (Signed)
Patient states during his last appointment he was given a letter for a $2500.00 credit towards his Repatha medication, however he has lost the letter. Patient is inquiring if he is eligible to receive another copy of the letter. Please call.

## 2019-12-07 NOTE — Telephone Encounter (Signed)
Returned the call to the patient. He stated that he was approved for assistance for the [place]. He lost the letter and ended up having to pay full price. He was wanting to know how he could use the assistance provided.

## 2019-12-07 NOTE — Telephone Encounter (Signed)
Returned a call to the pt and stated that they are going to receive a refund from the pharmacy and that the rest of the year should be free for the repatha. Pt voiced gratitude and understanding.

## 2019-12-10 DIAGNOSIS — G4733 Obstructive sleep apnea (adult) (pediatric): Secondary | ICD-10-CM | POA: Diagnosis not present

## 2019-12-17 DIAGNOSIS — M170 Bilateral primary osteoarthritis of knee: Secondary | ICD-10-CM | POA: Diagnosis not present

## 2019-12-17 DIAGNOSIS — M1712 Unilateral primary osteoarthritis, left knee: Secondary | ICD-10-CM | POA: Diagnosis not present

## 2019-12-17 DIAGNOSIS — M1711 Unilateral primary osteoarthritis, right knee: Secondary | ICD-10-CM | POA: Diagnosis not present

## 2019-12-17 DIAGNOSIS — M25561 Pain in right knee: Secondary | ICD-10-CM | POA: Diagnosis not present

## 2019-12-21 DIAGNOSIS — Z1211 Encounter for screening for malignant neoplasm of colon: Secondary | ICD-10-CM | POA: Diagnosis not present

## 2019-12-21 DIAGNOSIS — J449 Chronic obstructive pulmonary disease, unspecified: Secondary | ICD-10-CM | POA: Diagnosis not present

## 2019-12-21 DIAGNOSIS — F5101 Primary insomnia: Secondary | ICD-10-CM | POA: Diagnosis not present

## 2019-12-21 DIAGNOSIS — I10 Essential (primary) hypertension: Secondary | ICD-10-CM | POA: Diagnosis not present

## 2019-12-21 DIAGNOSIS — E782 Mixed hyperlipidemia: Secondary | ICD-10-CM | POA: Diagnosis not present

## 2019-12-21 DIAGNOSIS — F330 Major depressive disorder, recurrent, mild: Secondary | ICD-10-CM | POA: Diagnosis not present

## 2019-12-21 DIAGNOSIS — E1121 Type 2 diabetes mellitus with diabetic nephropathy: Secondary | ICD-10-CM | POA: Diagnosis not present

## 2019-12-21 DIAGNOSIS — E119 Type 2 diabetes mellitus without complications: Secondary | ICD-10-CM | POA: Diagnosis not present

## 2019-12-21 DIAGNOSIS — Z0000 Encounter for general adult medical examination without abnormal findings: Secondary | ICD-10-CM | POA: Diagnosis not present

## 2019-12-21 DIAGNOSIS — I2510 Atherosclerotic heart disease of native coronary artery without angina pectoris: Secondary | ICD-10-CM | POA: Diagnosis not present

## 2019-12-21 DIAGNOSIS — K219 Gastro-esophageal reflux disease without esophagitis: Secondary | ICD-10-CM | POA: Diagnosis not present

## 2020-01-01 ENCOUNTER — Other Ambulatory Visit: Payer: Self-pay | Admitting: Cardiology

## 2020-01-01 ENCOUNTER — Other Ambulatory Visit: Payer: Self-pay

## 2020-01-01 MED ORDER — ISOSORBIDE MONONITRATE ER 60 MG PO TB24: 60.0000 mg | ORAL_TABLET | Freq: Every day | ORAL | 1 refills | Status: DC

## 2020-01-01 MED ORDER — LOSARTAN POTASSIUM 25 MG PO TABS: 25.0000 mg | ORAL_TABLET | Freq: Every day | ORAL | 1 refills | Status: DC

## 2020-01-01 NOTE — Telephone Encounter (Signed)
*  STAT* If patient is at the pharmacy, call can be transferred to refill team.   1. Which medications need to be refilled? (please list name of each medication and dose if known) LOSARTAN and ISOSORBIDE   2. Which pharmacy/location (including street and city if local pharmacy) is medication to be sent to? [place] [address]  # [phone]    6. Do they need a 30 day or 90 day supply? 90  both

## 2020-01-01 NOTE — Telephone Encounter (Signed)
Called patient to let him know that I refilled his Losartan and Imdur.

## 2020-01-09 ENCOUNTER — Telehealth: Payer: Self-pay | Admitting: Internal Medicine

## 2020-01-09 DIAGNOSIS — G4733 Obstructive sleep apnea (adult) (pediatric): Secondary | ICD-10-CM | POA: Diagnosis not present

## 2020-01-09 NOTE — Telephone Encounter (Signed)
Patient called this evening concerned that his blood pressure is higher than normal. He had one reading of 182/90 and otherwise his readings today have been mostly in the 140s. He is concerned as he is going on vacation on Monday to [place]. His blood pressures prior to today have been in the 120s-140s. Advised him to continue to check his blood pressure intermittently tomorrow and call us back if his blood pressure remains elevated. Could consider increasing his carvedilol if this is the case. Otherwise, did not recommend any changes today as his pressures today are unusual for him and he is asymptomatic with these.   Alric Quan, MD

## 2020-01-13 ENCOUNTER — Other Ambulatory Visit: Payer: Self-pay | Admitting: *Deleted

## 2020-01-13 ENCOUNTER — Other Ambulatory Visit: Payer: Self-pay | Admitting: Cardiology

## 2020-01-13 DIAGNOSIS — F5101 Primary insomnia: Secondary | ICD-10-CM

## 2020-01-13 MED ORDER — CARVEDILOL 6.25 MG PO TABS: 6.2500 mg | ORAL_TABLET | Freq: Two times a day (BID) | ORAL | 3 refills | Status: DC

## 2020-01-13 NOTE — Telephone Encounter (Signed)
Rx has been sent to the pharmacy electronically. ° °

## 2020-01-18 ENCOUNTER — Telehealth: Payer: Self-pay | Admitting: Cardiology

## 2020-01-18 NOTE — Telephone Encounter (Signed)
Returned the call to the patient. He stated that his blood pressures have been fluctuating. He has been having dizzy and having weak spells. He stated that he currently was feeling fine. He stated that he has not been staying hydrated and has been advised to increase his fluid intake.   He is currently on:   Carvedilol 6.25 mg bid Losartan 25 mg daily Imdur 60 mg daily  This morning his blood pressure was 105/53 around 10 am. The patient was unsure what the other readings were and at one times they were taken.   He has been advised to take his blood pressure twice daily, keep a log of these readings and to call back with this information.  He has been advised to call back if the dizziness continues.

## 2020-01-18 NOTE — Telephone Encounter (Signed)
New Message   Pt c/o BP issue:  1. What are your last 5 BP readings? 107/68, 105/53, 143/80, 133/79, 130/74 2. Are you having any other symptoms (ex. Dizziness, headache, blurred vision, passed out)? Sometimes he feels weak and has to set down. This has been occurring for about 2 weeks.  3. What is your medication issue?    Patient states that his BP seem to fluctuate a lot

## 2020-02-02 DIAGNOSIS — M25561 Pain in right knee: Secondary | ICD-10-CM | POA: Diagnosis not present

## 2020-02-02 DIAGNOSIS — Z5181 Encounter for therapeutic drug level monitoring: Secondary | ICD-10-CM | POA: Diagnosis not present

## 2020-02-02 DIAGNOSIS — Z79899 Other long term (current) drug therapy: Secondary | ICD-10-CM | POA: Diagnosis not present

## 2020-03-03 DIAGNOSIS — M1711 Unilateral primary osteoarthritis, right knee: Secondary | ICD-10-CM | POA: Diagnosis not present

## 2020-03-03 DIAGNOSIS — M170 Bilateral primary osteoarthritis of knee: Secondary | ICD-10-CM | POA: Diagnosis not present

## 2020-03-03 DIAGNOSIS — M1712 Unilateral primary osteoarthritis, left knee: Secondary | ICD-10-CM | POA: Diagnosis not present

## 2020-03-04 ENCOUNTER — Other Ambulatory Visit: Payer: Self-pay

## 2020-03-04 MED ORDER — CARVEDILOL 6.25 MG PO TABS: 6.2500 mg | ORAL_TABLET | Freq: Two times a day (BID) | ORAL | 3 refills | Status: DC

## 2020-03-25 DIAGNOSIS — G4733 Obstructive sleep apnea (adult) (pediatric): Secondary | ICD-10-CM | POA: Diagnosis not present

## 2020-03-27 DIAGNOSIS — J069 Acute upper respiratory infection, unspecified: Secondary | ICD-10-CM | POA: Diagnosis not present

## 2020-03-27 DIAGNOSIS — J9801 Acute bronchospasm: Secondary | ICD-10-CM | POA: Diagnosis not present

## 2020-03-27 DIAGNOSIS — Z20828 Contact with and (suspected) exposure to other viral communicable diseases: Secondary | ICD-10-CM | POA: Diagnosis not present

## 2020-03-27 DIAGNOSIS — J209 Acute bronchitis, unspecified: Secondary | ICD-10-CM | POA: Diagnosis not present

## 2020-03-27 DIAGNOSIS — R05 Cough: Secondary | ICD-10-CM | POA: Diagnosis not present

## 2020-03-28 DIAGNOSIS — J40 Bronchitis, not specified as acute or chronic: Secondary | ICD-10-CM | POA: Diagnosis not present

## 2020-04-01 DIAGNOSIS — N35011 Post-traumatic bulbous urethral stricture: Secondary | ICD-10-CM | POA: Diagnosis not present

## 2020-04-01 DIAGNOSIS — Z8551 Personal history of malignant neoplasm of bladder: Secondary | ICD-10-CM | POA: Diagnosis not present

## 2020-04-01 DIAGNOSIS — Z8546 Personal history of malignant neoplasm of prostate: Secondary | ICD-10-CM | POA: Diagnosis not present

## 2020-04-05 ENCOUNTER — Other Ambulatory Visit: Payer: Self-pay

## 2020-04-05 DIAGNOSIS — J441 Chronic obstructive pulmonary disease with (acute) exacerbation: Secondary | ICD-10-CM | POA: Diagnosis not present

## 2020-04-05 DIAGNOSIS — R05 Cough: Secondary | ICD-10-CM | POA: Diagnosis not present

## 2020-04-05 MED ORDER — LOSARTAN POTASSIUM 25 MG PO TABS: 25.0000 mg | ORAL_TABLET | Freq: Every day | ORAL | 2 refills | Status: DC

## 2020-04-08 DIAGNOSIS — G4733 Obstructive sleep apnea (adult) (pediatric): Secondary | ICD-10-CM | POA: Diagnosis not present

## 2020-04-12 DIAGNOSIS — L578 Other skin changes due to chronic exposure to nonionizing radiation: Secondary | ICD-10-CM | POA: Diagnosis not present

## 2020-04-12 DIAGNOSIS — L720 Epidermal cyst: Secondary | ICD-10-CM | POA: Diagnosis not present

## 2020-04-12 DIAGNOSIS — L570 Actinic keratosis: Secondary | ICD-10-CM | POA: Diagnosis not present

## 2020-04-15 DIAGNOSIS — S30860A Insect bite (nonvenomous) of lower back and pelvis, initial encounter: Secondary | ICD-10-CM | POA: Diagnosis not present

## 2020-04-15 DIAGNOSIS — W57XXXA Bitten or stung by nonvenomous insect and other nonvenomous arthropods, initial encounter: Secondary | ICD-10-CM | POA: Diagnosis not present

## 2020-04-19 DIAGNOSIS — M47896 Other spondylosis, lumbar region: Secondary | ICD-10-CM | POA: Diagnosis not present

## 2020-04-20 ENCOUNTER — Telehealth: Payer: Self-pay | Admitting: Cardiology

## 2020-04-20 ENCOUNTER — Other Ambulatory Visit: Payer: Self-pay | Admitting: Cardiology

## 2020-04-20 MED ORDER — NITROGLYCERIN 0.4 MG SL SUBL: 0.4000 mg | SUBLINGUAL_TABLET | SUBLINGUAL | 6 refills | Status: DC | PRN

## 2020-04-20 NOTE — Telephone Encounter (Signed)
I would suggest with these symptoms and his previous history that he be evaluated at the ED.

## 2020-04-20 NOTE — Telephone Encounter (Signed)
Spoke with pt, aware of dr hochrein's recommendations. The patient reports he is currently pain free and does not want to go at this time. Patient voiced understanding to call 911 and go to the ER if he develops pain again. His appointment with dr Percival Spanish was moved up to June.

## 2020-04-20 NOTE — Telephone Encounter (Signed)
*  STAT* If patient is at the pharmacy, call can be transferred to refill team.   1. Which medications need to be refilled? (please list name of each medication and dose if known) nitroGLYCERIN (NITROSTAT) 0.4 MG SL tablet  2. Which pharmacy/location (including street and city if local pharmacy) is medication to be sent to? [place], [place]  3. Do they need a 30 day or 90 day supply? 30 day  Patient states his medication is expired.

## 2020-04-20 NOTE — Telephone Encounter (Signed)
New Message  Pt c/o of Chest Pain: STAT if CP now or developed within 24 hours  1. Are you having CP right now? Yes  2. Are you experiencing any other symptoms (ex. SOB, nausea, vomiting, sweating)? sweating  3. How long have you been experiencing CP? Last night  4. Is your CP continuous or coming and going? continuous  5. Have you taken Nitroglycerin? Yes ?

## 2020-04-20 NOTE — Telephone Encounter (Signed)
Spoke with pt, yesterday he played golf and got a little hot. Last night he developed chest pain while watching TV that got better when he laid down. He slept fine last night. This morning after eating breakfast he went to wash his clothes and developed a pain in the center of his chest that was an 8 on scale 1-10. No SOB or other symptoms. He sat down and took 1 and 1/2 of the isosorbide and his other medications and now the pain is down to 2-3. His bp is elevated 186/93 and then 168/93. He reports he has been taking antibiotics for 3 weeks now because of congestion in his chest and feels they are causing some problems as he is belching and has gas as well. He was encouraged to take his antiacid. He has a follow up appointment with dr hochrein on 05-23-20. He got up and moved around while I was on the phone to make sure the pain did not return. Will forward to dr hochrein to review and advise.

## 2020-04-22 DIAGNOSIS — S30870A Other superficial bite of lower back and pelvis, initial encounter: Secondary | ICD-10-CM | POA: Diagnosis not present

## 2020-04-22 DIAGNOSIS — T63391A Toxic effect of venom of other spider, accidental (unintentional), initial encounter: Secondary | ICD-10-CM | POA: Diagnosis not present

## 2020-04-22 DIAGNOSIS — L089 Local infection of the skin and subcutaneous tissue, unspecified: Secondary | ICD-10-CM | POA: Diagnosis not present

## 2020-05-01 NOTE — Progress Notes (Signed)
Cardiology Office Note   Date:  05/02/2020   ID:  Anthony Hartman, DOB May 29, 1941, MRN [id number]  PCP:  Myrlene Broker, MD  Cardiologist:   Minus Breeding, MD   Chief Complaint  Patient presents with  . Chest Pain      History of Present Illness: Anthony Hartman is a 79 y.o. male who presents for evaluation of CAD.  In 2012 he had an occluded OM.  He managed medically as He did have an inferior MI in November 2017 and had a bare-metal stent to his right coronary artery.  This was all in [facility].   He had a cath in 2018 and he had an occluded ramus that they could not cross.  He was managed medically.  He presented in June with unstable angina and he had PCI of a diagonal.  He called recently with chest pain.  He did not want to present to the emergency room however.   He said that he had an episode of chest discomfort that day.  It was similar to his previous angina.  It is a midsternal discomfort.  It happened at rest.  He did not want to take a sublingual nitroglycerin because that "wiped him out last time he took it".  He took half of an isosorbide and his pain eventually went away.  He has had some left shoulder discomfort but this is not like previous angina and it hurts to move it in a certain way.  He has been concerned because he has been having a lot more diaphoresis with the warmer weather than he typically has had.  He has been sweating quite a bit.  He is lost some weight.  He had a tick removed recently and had antibiotics for this.  He is not describing ongoing need for nitroglycerin or ongoing substernal chest pressure.  He is not having any new shortness of breath, PND or orthopnea.  He has had no weight gain or edema.   Past Medical History:  Diagnosis Date  . Anxiety   . Arthritis   . Bladder cancer ([place])   . Bladder stone   . BPH (benign prostatic hypertrophy)   . Depression   . GERD (gastroesophageal reflux disease)   . History of bladder cancer      CARCINOMA IN SITU OF BLADDER  . History of kidney stones   . History of myocardial infarction    07-17-2011--   MI W/ OCCLUDED OM1. 2017 occluded RCA treated with a bare-metal stent, high-grade ramus intermediate stenosis in 2018 with failed attempted PCI  . Hyperlipidemia   . Hypertension   . Myocardial infarction [facility]) 2012; 2017   2012 - noted Subtotal CTO of OM/RI;  09/2016 - Acute Infer STEMI - BMS x 2 mRCA (3.5 mm x 15 & 3.5 x 12 - tapered 4.0 - 3.8 mm)  . OSA (obstructive sleep apnea)    cpap non-compliant   . Prostate cancer [facility])     Past Surgical History:  Procedure Laterality Date  . ARTHRODESIS METATARSALPHALANGEAL JOINT (MTPJ) Right 02/20/2018   Procedure: Right Hallux Metatarsal Phalangeal Joint Arthrodesis; Right Silver Bunionectomy;  Surgeon: Wylene Simmer, MD;  Location: [place];  Service: Orthopedics;  Laterality: Right;  . BACK SURGERY    . CARDIOVASCULAR STRESS TEST  04/22/11  . CORONARY ANGIOPLASTY  03/2017    failed PCI RI (OM1) 2018 -- similar to 2012.  . CORONARY STENT INTERVENTION  09/2016  [facility] (Acute Inferior STEMI)- Acute mid RCA 100% --> Integrity BMS 3.5 mm x 43m (3.8 mm) & 3.5 mm x 12 mm (4.1 mm) --BMS used because of need for back surgery  . CORONARY STENT INTERVENTION N/A 05/01/2019   Procedure: CORONARY STENT INTERVENTION;  Surgeon: VJettie Booze MD;  [facility];  Service: Cardiovascular;  Laterality: N/A;  . CYSTO/ BLADDER BX'S  X2  2001  / X1  2003  . CYSTO/ BLADDER BX'S/ BILATERAL RETROGRADE URETEROPYLEGRAM  2003; 2007; 2008;  12-30-2007   CARCINOMA IN SITU OF BLADDER  . CYSTOSCOPY WITH LITHOLAPAXY N/A 05/07/2013   Procedure: CYSTOSCOPY WITH LITHOLAPAXY;  Surgeon: SFranchot Gallo MD;  Location: [facility];  Service: Urology;  Laterality: N/A;  . EHL Lengthening Right 05/10/2017   RT FOOT  . Hammer Toe Repair Right 05/10/2017   Rt #2 Toe  . HAMMERTOE RECONSTRUCTION WITH WEIL OSTEOTOMY Right  02/20/2018   Procedure: Right Second Metatarsal WPriscille Heidelbergand Revision Hammertoe Correction;  Surgeon: HWylene Simmer MD;  [place]  Service: Orthopedics;  Laterality: Right;  . LEFT HEART CATH AND CORONARY ANGIOGRAPHY  07/17/2011   OCCLUDED OM1 WITH UNSUCCESSFUL PCI ATTEMPT, O/W MILD TO MODERATE DISEASE, DR ANDY CHIU ([facility])    . LEFT HEART CATH AND CORONARY ANGIOGRAPHY  09/2016   Acute Inferior STEMI ([facility])  - Mid LAD 30%, D2 50%.  OM 1/RI 100%, mid circumflex 30%.  Acute mRCA 100% (thrombotic) - BMS PCI.  EF 50% with inferobasal hypokinesis.  .Marland KitchenLEFT HEART CATH AND CORONARY ANGIOGRAPHY  03/2017   (Dr. RChuck Hint- [facility]): OM(RI) ~ CTO (unfixable PCI unable to cross with wire/balloon. pLAD 40%, ost D1 40%;  prox-mid RCA 15%.  Mild stenosis with small mid LCx.  EF 60%.  .Marland KitchenLEFT HEART CATH AND CORONARY ANGIOGRAPHY N/A 05/01/2019   Procedure: LEFT HEART CATH AND CORONARY ANGIOGRAPHY;  Surgeon: VJettie Booze MD;  [facility];  Service: Cardiovascular;  Laterality: N/A;  . LUMBAR DECOMPRESSION FORAMINOTOMY L3 -- L5/  REMOVAL CYST L4-5 FACET JOINT  08-21-2009   ALSO HAD PRIOR BACK SURG IN 1992  . LUMBAR LAMINECTOMY  04/04/2017   L2-3  . LUMBAR LAMINECTOMY/DECOMPRESSION MICRODISCECTOMY N/A 04/04/2017   Procedure: Laminectomy and Foraminotomy - Lumbar Two-Three;  Surgeon: JEustace Moore MD;  [place] [facility]  Service: Neurosurgery;  Laterality: N/A;  . OPEN TENOTOMY OF FLEXOR 2ND AND 3RD RIGHT TOES  APRIL 2008  . PROSTATE BIOPSY    . TRANSTHORACIC ECHOCARDIOGRAM  07/17/2011   at [facility], MILD TO MODERATE CONCENTRIC LVH/ NORMAL LVSF/ EF 55-60%/ MILD AORTIC STENOSIS  . TRANSTHORACIC ECHOCARDIOGRAM  06/2018   [place]: Normal LV function/thickness.  EF 50 to 55%.  Normal wall motion.  Mild to moderate MAC.  Moderate to Severe Aortic Stenosis (calcified).  Mild aortic insufficiency.  . TRANSURETHRAL  RESECTION OF BLADDER TUMOR  01-10-2004  . TRANSURETHRAL RESECTION OF PROSTATE N/A 05/07/2013   Procedure: TRANSURETHRAL RESECTION OF THE PROSTATE WITH GYRUS INSTRUMENTS;  Surgeon: SFranchot Gallo MD;  [facility];  Service: Urology;  Laterality: N/A;     Current Outpatient Medications  Medication Sig Dispense Refill  . acetaminophen (TYLENOL) 500 MG tablet Take 500 mg by mouth every 6 (six) hours as needed (pain.).    .Marland KitchenALPRAZolam (XANAX) 1 MG tablet Take 0.5 mg by mouth at bedtime.    .Marland KitchenbuPROPion (WELLBUTRIN XL) 300 MG 24 hr  tablet Take 300 mg by mouth daily.    . carvedilol (COREG) 6.25 MG tablet Take 1 tablet (6.25 mg total) by mouth 2 (two) times daily with a meal. 180 tablet 3  . cefUROXime (CEFTIN) 500 MG tablet Take 500 mg by mouth 2 (two) times daily.    . clopidogrel (PLAVIX) 75 MG tablet TAKE 1 TABLET BY MOUTH EVERY DAY 90 tablet 2  . doxycycline (ADOXA) 100 MG tablet     . Evolocumab (REPATHA SURECLICK) 062 MG/ML SOAJ Inject 140 mg into the skin every 14 (fourteen) days. 2 pen 11  . famotidine (PEPCID) 20 MG tablet Take 20 mg by mouth 2 (two) times daily as needed (acid reflux/indigestion.).     Marland Kitchen HYDROcodone-acetaminophen (NORCO/VICODIN) 5-325 MG tablet Take 1 tablet by mouth 3 (three) times daily as needed.    Marland Kitchen losartan (COZAAR) 25 MG tablet Take 1 tablet (25 mg total) by mouth daily. 90 tablet 2  . Multiple Vitamin (MULTIVITAMIN WITH MINERALS) TABS tablet Take 1 tablet by mouth daily.    . nitroGLYCERIN (NITROSTAT) 0.4 MG SL tablet Place 1 tablet (0.4 mg total) under the tongue every 5 (five) minutes as needed for chest pain. 25 tablet 6  . Polyethyl Glycol-Propyl Glycol (LUBRICANT EYE DROPS) 0.4-0.3 % SOLN Place 1 drop into both eyes 3 (three) times daily as needed (dry/irritated eyes).    Marland Kitchen testosterone cypionate (DEPOTESTOSTERONE CYPIONATE) 200 MG/ML injection Inject 120 mg into the muscle once a week.    . traMADol (ULTRAM) 50 MG tablet Take 50 mg by mouth every 6  (six) hours as needed (severe pain.).     Marland Kitchen valACYclovir (VALTREX) 1000 MG tablet Take 1,000 mg by mouth 3 (three) times daily as needed (fever blisters).     . isosorbide mononitrate (IMDUR) 60 MG 24 hr tablet Take 1 tablet (60 mg total) by mouth daily. 90 tablet 1  . meloxicam (MOBIC) 7.5 MG tablet Take 1 tablet (7.5 mg total) by mouth daily as needed for pain. 30 tablet 3   No current facility-administered medications for this visit.    Allergies:   Statins, Augmentin [amoxicillin-pot clavulanate], Dexamethasone, and Sulfa antibiotics     ROS:  Please see the history of present illness.   Otherwise, review of systems are positive for weight loss, tick bite, diaphoresis.   All other systems are reviewed and negative.    PHYSICAL EXAM: VS:  BP (!) 158/78   Pulse 70   Ht _0  (1.93 m)   Wt 232 lb (105.2 kg)   SpO2 97%   BMI 28.24 kg/m  , BMI Body mass index is 28.24 kg/m. GENERAL:  Well appearing NECK:  No jugular venous distention, waveform within normal limits, carotid upstroke brisk and symmetric, no bruits, no thyromegaly LUNGS:  Clear to auscultation bilaterally CHEST:  Unremarkable HEART:  PMI not displaced or sustained,S1 and S2 within normal limits, no S3, no S4, no clicks, no rubs, 2 out of 6 apical systolic murmur radiating slightly at the aortic outflow tract and mid peaking, no diastolic murmurs ABD:  Flat, positive bowel sounds normal in frequency in pitch, no bruits, no rebound, no guarding, no midline pulsatile mass, no hepatomegaly, no splenomegaly EXT:  2 plus pulses throughout, no edema, no cyanosis no clubbing   EKG:  EKG is  ordered today. Normal sinus rhythm, rate 70, axis within normal limits, intervals within normal limits, no acute ST-T wave changes.   Recent Labs: No results found for requested labs within last  8760 hours.    Lipid Panel    Component Value Date/Time   CHOL 165 05/25/2019 0929   TRIG 111 05/25/2019 0929   HDL 48 05/25/2019 0929    CHOLHDL 3.4 05/25/2019 0929   LDLCALC 95 05/25/2019 0929      Wt Readings from Last 3 Encounters:  05/02/20 232 lb (105.2 kg)  11/23/19 240 lb (108.9 kg)  05/25/19 240 lb (108.9 kg)      Other studies Reviewed: Additional studies/ records that were reviewed today include:  Review of the above records demonstrates:  Please see elsewhere in the note.    Intervention     ASSESSMENT AND PLAN:  CAD:   The patient has had some atypical symptoms. I will bring the patient back for a POET (Plain Old Exercise Test). This will allow me to screen for obstructive coronary disease, risk stratify and very importantly provide a prescription for exercise.  HTN: The blood pressure is at target.  No change in therapy.    DYSLIPIDEMIA:   He was not quite at target with an LDL   he has been on PCSK9.  I am going to check a lipid profile when he comes back.  WEIGHT LOSS: With this in the diaphoresis I will check a TSH.  AS:     I will follow this up with an echocardiogram.  COVID EDUCATION:   He has been vaccinated.    Current medicines are reviewed at length with the patient today.  The patient does not have concerns regarding medicines.  The following changes have been made: As above  Labs/ tests ordered today include:.  Orders Placed This Encounter  Procedures  . Lipid panel  . TSH  . EXERCISE TOLERANCE TEST (ETT)  . EKG 12-Lead  . ECHOCARDIOGRAM COMPLETE     Disposition:   FU with me in 12 months.     Signed, Minus Breeding, MD  05/02/2020 1:03 PM    [facility]

## 2020-05-02 ENCOUNTER — Ambulatory Visit (INDEPENDENT_AMBULATORY_CARE_PROVIDER_SITE_OTHER): Payer: PPO | Admitting: Cardiology

## 2020-05-02 ENCOUNTER — Other Ambulatory Visit: Payer: Self-pay

## 2020-05-02 ENCOUNTER — Encounter: Payer: Self-pay | Admitting: Cardiology

## 2020-05-02 VITALS — BP 158/78 | HR 70 | Ht 76.0 in | Wt 232.0 lb

## 2020-05-02 DIAGNOSIS — E785 Hyperlipidemia, unspecified: Secondary | ICD-10-CM | POA: Diagnosis not present

## 2020-05-02 DIAGNOSIS — I350 Nonrheumatic aortic (valve) stenosis: Secondary | ICD-10-CM | POA: Diagnosis not present

## 2020-05-02 DIAGNOSIS — R079 Chest pain, unspecified: Secondary | ICD-10-CM | POA: Diagnosis not present

## 2020-05-02 DIAGNOSIS — I209 Angina pectoris, unspecified: Secondary | ICD-10-CM

## 2020-05-02 DIAGNOSIS — Z7189 Other specified counseling: Secondary | ICD-10-CM

## 2020-05-02 DIAGNOSIS — I10 Essential (primary) hypertension: Secondary | ICD-10-CM

## 2020-05-02 DIAGNOSIS — I25119 Atherosclerotic heart disease of native coronary artery with unspecified angina pectoris: Secondary | ICD-10-CM | POA: Diagnosis not present

## 2020-05-02 MED ORDER — MELOXICAM 7.5 MG PO TABS
7.5000 mg | ORAL_TABLET | Freq: Every day | ORAL | 3 refills | Status: DC | PRN
Start: 2020-05-02 — End: 2021-08-11

## 2020-05-02 MED ORDER — MELOXICAM 7.5 MG PO TABS
7.5000 mg | ORAL_TABLET | Freq: Every day | ORAL | 3 refills | Status: DC | PRN
Start: 2020-05-02 — End: 2020-05-02

## 2020-05-02 NOTE — Patient Instructions (Addendum)
Medication Instructions:  START MOBIC 7.5MG  DAILY AS NEEDED *If you need a refill on your cardiac medications before your next appointment, please call your pharmacy*  Lab Work: Your physician recommends that you return for lab work at your next appointment. (LIPIDS, TSH) If you have labs (blood work) drawn today and your tests are completely normal, you will receive your results only by: Marland Kitchen MyChart Message (if you have MyChart) OR . A paper copy in the mail If you have any lab test that is abnormal or we need to change your treatment, we will call you to review the results.  Testing/Procedures: Your physician has requested that you have an exercise tolerance test. For further information please visit [email or web address]. Please also follow instruction sheet, as given.  Your physician has requested that you have an echocardiogram. Echocardiography is a painless test that uses sound waves to create images of your heart. It provides your doctor with information about the size and shape of your heart and how well your heart's chambers and valves are working. This procedure takes approximately one hour. There are no restrictions for this procedure. [place]  Follow-Up: At [facility], you and your health needs are our priority.  As part of our continuing mission to provide you with exceptional heart care, we have created designated Provider Care Teams.  These Care Teams include your primary Cardiologist (physician) and Advanced Practice Providers (APPs -  Physician Assistants and Nurse Practitioners) who all work together to provide you with the care you need, when you need it.  Your next appointment:   12 month(s) You will receive a reminder letter in the mail two months in advance. If you don't receive a letter, please call our office to schedule the follow-up appointment.  The format for your next appointment:   In Person  Provider:   Minus Breeding, MD

## 2020-05-03 ENCOUNTER — Other Ambulatory Visit: Payer: Self-pay

## 2020-05-03 DIAGNOSIS — E785 Hyperlipidemia, unspecified: Secondary | ICD-10-CM | POA: Diagnosis not present

## 2020-05-03 DIAGNOSIS — M961 Postlaminectomy syndrome, not elsewhere classified: Secondary | ICD-10-CM | POA: Diagnosis not present

## 2020-05-04 LAB — LIPID PANEL
Chol/HDL Ratio: 2.8 ratio (ref 0.0–5.0)
Cholesterol, Total: 178 mg/dL (ref 100–199)
HDL: 63 mg/dL (ref >39)
LDL Chol Calc (NIH): 95 mg/dL (ref 0–99)
Triglycerides: 113 mg/dL (ref 0–149)
VLDL Cholesterol Cal: 20 mg/dL (ref 5–40)

## 2020-05-11 ENCOUNTER — Encounter: Payer: Self-pay | Admitting: *Deleted

## 2020-05-11 ENCOUNTER — Telehealth: Payer: Self-pay | Admitting: *Deleted

## 2020-05-11 NOTE — Telephone Encounter (Signed)
-----   Message from Minus Breeding, MD sent at 05/07/2020  8:16 PM EDT ----- LDL is not at target start bempedoic acid 180 mg po daily.    Might need Pharm D help to get this approved.  Call Mr. Brander with the results and send results to Myrlene Broker, MD

## 2020-05-11 NOTE — Telephone Encounter (Signed)
This encounter was created in error - please disregard.

## 2020-05-11 NOTE — Telephone Encounter (Signed)
-----   Message from Minus Breeding, MD sent at 05/07/2020  8:16 PM EDT ----- LDL is not at target start bempedoic acid 180 mg po daily.    Might need Pharm D help to get this approved.  Call Mr. Jakubiak with the results and send results to Myrlene Broker, MD

## 2020-05-11 NOTE — Telephone Encounter (Signed)
Left message to call back   Minus Breeding, MD  05/07/2020 8:16 PM EDT     LDL is not at target start bempedoic acid 180 mg po daily.  Might need Pharm D help to get this approved. Call Mr. Ganim with the results and send results to Myrlene Broker, MD

## 2020-05-11 NOTE — Telephone Encounter (Signed)
Left message to call back  

## 2020-05-13 ENCOUNTER — Telehealth (HOSPITAL_COMMUNITY): Payer: Self-pay

## 2020-05-13 NOTE — Telephone Encounter (Signed)
Encounter complete. 

## 2020-05-18 ENCOUNTER — Ambulatory Visit (HOSPITAL_COMMUNITY)
Admission: RE | Admit: 2020-05-18 | Discharge: 2020-05-18 | Disposition: A | Discharge status: Home / Self Care | Payer: PPO | Source: Ambulatory Visit | Attending: Cardiology | Admitting: Cardiology

## 2020-05-18 ENCOUNTER — Other Ambulatory Visit: Payer: Self-pay

## 2020-05-18 DIAGNOSIS — I25119 Atherosclerotic heart disease of native coronary artery with unspecified angina pectoris: Secondary | ICD-10-CM

## 2020-05-18 DIAGNOSIS — R079 Chest pain, unspecified: Secondary | ICD-10-CM | POA: Diagnosis not present

## 2020-05-18 DIAGNOSIS — I10 Essential (primary) hypertension: Secondary | ICD-10-CM | POA: Insufficient documentation

## 2020-05-18 LAB — EXERCISE TOLERANCE TEST
Estimated workload: 7 METS
Exercise duration (min): 6 min
Exercise duration (sec): 0 s
MPHR: 141 {beats}/min
Peak HR: 129 {beats}/min
Percent HR: 91 %
Rest HR: 64 {beats}/min

## 2020-05-18 LAB — TSH: TSH: 1.96 u[IU]/mL (ref 0.450–4.500)

## 2020-05-23 ENCOUNTER — Ambulatory Visit (HOSPITAL_COMMUNITY): Payer: PPO | Attending: Cardiology

## 2020-05-23 ENCOUNTER — Ambulatory Visit: Payer: PPO | Admitting: Cardiology

## 2020-05-23 ENCOUNTER — Other Ambulatory Visit: Payer: Self-pay

## 2020-05-23 DIAGNOSIS — I350 Nonrheumatic aortic (valve) stenosis: Secondary | ICD-10-CM | POA: Insufficient documentation

## 2020-05-27 ENCOUNTER — Telehealth: Payer: Self-pay | Admitting: Cardiology

## 2020-05-27 MED ORDER — NEXLETOL 180 MG PO TABS: 180.0000 mg | ORAL_TABLET | Freq: Every day | ORAL | 1 refills | Status: DC

## 2020-05-27 NOTE — Telephone Encounter (Signed)
Advised patient, verbalized understanding  

## 2020-05-27 NOTE — Telephone Encounter (Signed)
Advised patient of results, follow up scheduled Advised to call if any changes in the way he was feeling, ok to do his normal activities.  Patient is concerned about "sweating" episodes he is having.  States he can get out of shower and be sweating or just be sitting around and break out in a sweat Denies any chest pain, shortness of breath, or lightheaded/dizziness Will forward to Dr Percival Spanish for review

## 2020-05-27 NOTE — Telephone Encounter (Signed)
-----   Message from Minus Breeding, MD sent at 05/19/2020  7:46 AM EDT ----- This was a submaximal stress test.  However, I see no high risk findings.  I would not suggest further testing but he can let us know if his chest pain changes or worsens.  Call Mr. Holzschuh with the results and send results to Myrlene Broker, MD

## 2020-05-27 NOTE — Telephone Encounter (Signed)
I would not suggest that sweating is a significant indication of a cardiovascular event or risk.

## 2020-05-27 NOTE — Telephone Encounter (Signed)
-----   Message from Minus Breeding, MD sent at 05/24/2020  4:33 PM EDT ----- His EF was normal.   His AS is severe.  I would like to see him back in 3 months to discuss.  He does not report any dyspnea.  Call Mr. Anthony Hartman with the results and send results to Myrlene Broker, MD

## 2020-05-27 NOTE — Telephone Encounter (Signed)
New Message  Patient is calling in to go over test results again. States that he was driving at the time they were initially reviewed with him and he has follow up questions. Please give patient a call back today.

## 2020-05-27 NOTE — Telephone Encounter (Signed)
-----   Message from Minus Breeding, MD sent at 05/07/2020  8:16 PM EDT ----- LDL is not at target start bempedoic acid 180 mg po daily.    Might need Pharm D help to get this approved.  Call Mr. Beever with the results and send results to Myrlene Broker, MD

## 2020-05-30 ENCOUNTER — Telehealth: Payer: Self-pay | Admitting: Cardiology

## 2020-05-30 NOTE — Telephone Encounter (Signed)
Spoke with pt, questions regarding the AS being severe on echo answered. Symptoms for any problems discussed. Patient voiced understanding to call back with any concerns prior to follow up appointment.

## 2020-05-30 NOTE — Telephone Encounter (Signed)
Patient calling in regards to echo results. He follows up with Dr. Percival Spanish on 07/26/20 for his echo but he states he is concerned about the results being severe. He would like to discuss this further or possibly come in sooner. Please advise.

## 2020-06-01 ENCOUNTER — Telehealth: Payer: Self-pay | Admitting: Cardiology

## 2020-06-01 NOTE — Telephone Encounter (Signed)
Patient has not started this yet due to cost- he would like to know if he could use something else.  Will route to MD to advise.

## 2020-06-01 NOTE — Telephone Encounter (Signed)
Patient is calling regarding Bempedoic Acid (NEXLETOL) 180 MG TABS that was prescribed after labs from his 6/21 visit. He advised he hasn't picked up this medication from the pharmacy yet, but said it was $400 for 30 days. He said if he has to take it then he will but something cheaper would help him. Please advise.

## 2020-06-02 NOTE — Telephone Encounter (Signed)
Dr. Percival Spanish is away currently - addressing on his behalf. Any recommendations for alternatives given cost? Thanks!

## 2020-06-02 NOTE — Telephone Encounter (Signed)
Let's try a PA and see if the price comes down

## 2020-06-02 NOTE — Telephone Encounter (Signed)
Spoke with patient.  Nexletol copay is $90/month.  He currently takes Repatha but gets for free ($80/month copay) due to [facility].  Advised he call pharmacy and have them bill the copay for Nexletol to [facility].  This should get him both medications at no charge for a full year.  Patient voiced understanding, will have pharmacy call us if any problems.

## 2020-06-02 NOTE — Telephone Encounter (Signed)
[place] will route back to kristin alvstad

## 2020-06-08 NOTE — Telephone Encounter (Signed)
Patient aware, see 7/16 phone note

## 2020-06-14 DIAGNOSIS — G4733 Obstructive sleep apnea (adult) (pediatric): Secondary | ICD-10-CM | POA: Diagnosis not present

## 2020-06-14 DIAGNOSIS — J432 Centrilobular emphysema: Secondary | ICD-10-CM | POA: Diagnosis not present

## 2020-06-20 DIAGNOSIS — L299 Pruritus, unspecified: Secondary | ICD-10-CM | POA: Diagnosis not present

## 2020-06-20 DIAGNOSIS — M19012 Primary osteoarthritis, left shoulder: Secondary | ICD-10-CM | POA: Diagnosis not present

## 2020-06-20 DIAGNOSIS — M25512 Pain in left shoulder: Secondary | ICD-10-CM | POA: Diagnosis not present

## 2020-06-21 DIAGNOSIS — M19012 Primary osteoarthritis, left shoulder: Secondary | ICD-10-CM | POA: Diagnosis not present

## 2020-06-30 DIAGNOSIS — M47896 Other spondylosis, lumbar region: Secondary | ICD-10-CM | POA: Diagnosis not present

## 2020-07-14 ENCOUNTER — Other Ambulatory Visit: Payer: Self-pay | Admitting: Cardiology

## 2020-07-15 DIAGNOSIS — M533 Sacrococcygeal disorders, not elsewhere classified: Secondary | ICD-10-CM | POA: Diagnosis not present

## 2020-07-25 DIAGNOSIS — G4733 Obstructive sleep apnea (adult) (pediatric): Secondary | ICD-10-CM | POA: Diagnosis not present

## 2020-07-25 NOTE — Progress Notes (Signed)
Cardiology Office Note   Date:  07/26/2020   ID:  Anthony Hartman, DOB 1941/01/21, MRN [id number]  PCP:  Myrlene Broker, MD  Cardiologist:   Minus Breeding, MD    Chief Complaint  Patient presents with  . Aortic Stenosis      History of Present Illness: Anthony Hartman is a 79 y.o. male who presents for evaluation of CAD.  In 2012 he had an occluded OM.  He managed medically as He did have an inferior MI in November 2017 and had a bare-metal stent to his right coronary artery.  This was all in [facility].   He had a cath in 2018 and he had an occluded ramus that they could not cross.  He was managed medically.  He presented in June with unstable angina and he had PCI of a diagonal.  He called this summer with chest pain.  He had a submax stress test without ischemia.  He had an echo and the AS was severe.      I brought him back to discuss this.  He actually feels okay.  There were a couple of days where he missed his Imdur and he had some chest discomfort but once he was restarted on this he feels fine.  He is limited somewhat by back pain and is getting injections.  However, he is able to do some golfing.  He would only get short of breath if he climbs up an incline or flights of stairs.  He is not getting any resting shortness of breath, PND or orthopnea.  This probably is not much different than it has been.  He is not having any new resting chest pressure, neck or arm discomfort.  He has had no weight gain or edema.  He is fatigued.  However, he does not sleep well.    Past Medical History:  Diagnosis Date  . Anxiety   . Arthritis   . Bladder cancer ([place])   . Bladder stone   . BPH (benign prostatic hypertrophy)   . Depression   . GERD (gastroesophageal reflux disease)   . History of bladder cancer    CARCINOMA IN SITU OF BLADDER  . History of kidney stones   . History of myocardial infarction    07-17-2011--   MI W/ OCCLUDED OM1. 2017 occluded RCA treated with  a bare-metal stent, Anthony-grade ramus intermediate stenosis in 2018 with failed attempted PCI  . Hyperlipidemia   . Hypertension   . Myocardial infarction [facility]man) 2012; 2017   2012 - noted Subtotal CTO of OM/RI;  09/2016 - Acute Infer STEMI - BMS x 2 mRCA (3.5 mm x 15 & 3.5 x 12 - tapered 4.0 - 3.8 mm)  . OSA (obstructive sleep apnea)    cpap non-compliant   . Prostate cancer [facility])     Past Surgical History:  Procedure Laterality Date  . ARTHRODESIS METATARSALPHALANGEAL JOINT (MTPJ) Right 02/20/2018   Procedure: Right Hallux Metatarsal Phalangeal Joint Arthrodesis; Right Silver Bunionectomy;  Surgeon: Wylene Simmer, MD;  Location: [place];  Service: Orthopedics;  Laterality: Right;  . BACK SURGERY    . CARDIOVASCULAR STRESS TEST  04/22/11  . CORONARY ANGIOPLASTY  03/2017    failed PCI RI (OM1) 2018 -- similar to 2012.  . CORONARY STENT INTERVENTION  09/2016   [facility]man (Acute Inferior STEMI)- Acute mid RCA 100% --> Integrity BMS 3.5 mm x 32m (3.8 mm) & 3.5 mm x 12 mm (  4.1 mm) --BMS used because of need for back surgery  . CORONARY STENT INTERVENTION N/A 05/01/2019   Procedure: CORONARY STENT INTERVENTION;  Surgeon: Jettie Booze, MD;  Location: [place] [facility];  Service: Cardiovascular;  Laterality: N/A;  . CYSTO/ BLADDER BX'S  X2  2001  / X1  2003  . CYSTO/ BLADDER BX'S/ BILATERAL RETROGRADE URETEROPYLEGRAM  2003; 2007; 2008;  12-30-2007   CARCINOMA IN SITU OF BLADDER  . CYSTOSCOPY WITH LITHOLAPAXY N/A 05/07/2013   Procedure: CYSTOSCOPY WITH LITHOLAPAXY;  Surgeon: Franchot Gallo, MD;  Location: [facility];  Service: Urology;  Laterality: N/A;  . EHL Lengthening Right 05/10/2017   RT FOOT  . Hammer Toe Repair Right 05/10/2017   Rt #2 Toe  . HAMMERTOE RECONSTRUCTION WITH WEIL OSTEOTOMY Right 02/20/2018   Procedure: Right Second Metatarsal Priscille Heidelberg and Revision Hammertoe Correction;  Surgeon: Wylene Simmer, MD;  Location: [place];   Service: Orthopedics;  Laterality: Right;  . LEFT HEART CATH AND CORONARY ANGIOGRAPHY  07/17/2011   OCCLUDED OM1 WITH UNSUCCESSFUL PCI ATTEMPT, O/W MILD TO MODERATE DISEASE, DR ANDY CHIU (Anthony Hartman)    . LEFT HEART CATH AND CORONARY ANGIOGRAPHY  09/2016   Acute Inferior STEMI (Anthony Hartman)  - Mid LAD 30%, D2 50%.  OM 1/RI 100%, mid circumflex 30%.  Acute mRCA 100% (thrombotic) - BMS PCI.  EF 50% with inferobasal hypokinesis.  Anthony Hartman LEFT HEART CATH AND CORONARY ANGIOGRAPHY  03/2017   (Dr. Chuck Hint [facility] [place]): OM(RI) ~ CTO (unfixable PCI unable to cross with wire/balloon. pLAD 40%, ost D1 40%;  prox-mid RCA 15%.  Mild stenosis with small mid LCx.  EF 60%.  Anthony Hartman LEFT HEART CATH AND CORONARY ANGIOGRAPHY N/A 05/01/2019   Procedure: LEFT HEART CATH AND CORONARY ANGIOGRAPHY;  Surgeon: Jettie Booze, MD;  Location: [place] [facility];  Service: Cardiovascular;  Laterality: N/A;  . LUMBAR DECOMPRESSION FORAMINOTOMY L3 -- L5/  REMOVAL CYST L4-5 FACET JOINT  08-21-2009   ALSO HAD PRIOR BACK SURG IN 1992  . LUMBAR LAMINECTOMY  04/04/2017   L2-3  . LUMBAR LAMINECTOMY/DECOMPRESSION MICRODISCECTOMY N/A 04/04/2017   Procedure: Laminectomy and Foraminotomy - Lumbar Two-Three;  Surgeon: Eustace Moore, MD;  Location: [place];  Service: Neurosurgery;  Laterality: N/A;  . OPEN TENOTOMY OF FLEXOR 2ND AND 3RD RIGHT TOES  APRIL 2008  . PROSTATE BIOPSY    . TRANSTHORACIC ECHOCARDIOGRAM  07/17/2011   at [facility], MILD TO MODERATE CONCENTRIC LVH/ NORMAL LVSF/ EF 55-60%/ MILD AORTIC STENOSIS  . TRANSTHORACIC ECHOCARDIOGRAM  06/2018   (Anthony Hartman): Normal LV function/thickness.  EF 50 to 55%.  Normal wall motion.  Mild to moderate MAC.  Moderate to Severe Aortic Stenosis (calcified).  Mild aortic insufficiency.  . TRANSURETHRAL RESECTION OF BLADDER TUMOR  01-10-2004  . TRANSURETHRAL RESECTION OF PROSTATE N/A 05/07/2013   Procedure: TRANSURETHRAL RESECTION OF THE PROSTATE WITH GYRUS INSTRUMENTS;   Surgeon: Franchot Gallo, MD;  Location: [facility];  Service: Urology;  Laterality: N/A;     Current Outpatient Medications  Medication Sig Dispense Refill  . acetaminophen (TYLENOL) 500 MG tablet Take 500 mg by mouth every 6 (six) hours as needed (pain.).    Anthony Hartman ALPRAZolam (XANAX) 1 MG tablet Take 0.5 mg by mouth at bedtime.    . Bempedoic Acid (NEXLETOL) 180 MG TABS Take 180 mg by mouth daily. 90 tablet 1  . buPROPion (WELLBUTRIN XL) 300 MG 24 hr tablet Take 300 mg by mouth daily.    Anthony Hartman  carvedilol (COREG) 6.25 MG tablet Take 1 tablet (6.25 mg total) by mouth 2 (two) times daily with a meal. 180 tablet 3  . cefUROXime (CEFTIN) 500 MG tablet Take 500 mg by mouth 2 (two) times daily.    . clopidogrel (PLAVIX) 75 MG tablet TAKE 1 TABLET BY MOUTH EVERY DAY 90 tablet 2  . doxycycline (ADOXA) 100 MG tablet     . Evolocumab (REPATHA SURECLICK) 027 MG/ML SOAJ Inject 140 mg into the skin every 14 (fourteen) days. 2 pen 11  . famotidine (PEPCID) 20 MG tablet Take 20 mg by mouth 2 (two) times daily as needed (acid reflux/indigestion.).     Anthony Hartman isosorbide mononitrate (IMDUR) 60 MG 24 hr tablet TAKE 1 TABLET BY MOUTH ONCE (1) DAILY 90 tablet 2  . losartan (COZAAR) 25 MG tablet Take 1 tablet (25 mg total) by mouth daily. 90 tablet 2  . meloxicam (MOBIC) 7.5 MG tablet Take 1 tablet (7.5 mg total) by mouth daily as needed for pain. 30 tablet 3  . Multiple Vitamin (MULTIVITAMIN WITH MINERALS) TABS tablet Take 1 tablet by mouth daily.    . nitroGLYCERIN (NITROSTAT) 0.4 MG SL tablet Place 1 tablet (0.4 mg total) under the tongue every 5 (five) minutes as needed for chest pain. 25 tablet 6  . Polyethyl Glycol-Propyl Glycol (LUBRICANT EYE DROPS) 0.4-0.3 % SOLN Place 1 drop into both eyes 3 (three) times daily as needed (dry/irritated eyes).    Anthony Hartman testosterone cypionate (DEPOTESTOSTERONE CYPIONATE) 200 MG/ML injection Inject 120 mg into the muscle once a week.    . traMADol (ULTRAM) 50 MG tablet Take 50 mg by mouth every  6 (six) hours as needed (severe pain.).     Anthony Hartman valACYclovir (VALTREX) 1000 MG tablet Take 1,000 mg by mouth 3 (three) times daily as needed (fever blisters).     Anthony Hartman HYDROcodone-acetaminophen (NORCO/VICODIN) 5-325 MG tablet Take 1 tablet by mouth 3 (three) times daily as needed. (Patient not taking: Reported on 07/26/2020)     No current facility-administered medications for this visit.    Allergies:   Statins, Augmentin [amoxicillin-pot clavulanate], Dexamethasone, and Sulfa antibiotics     ROS:  Please see the history of present illness.   Otherwise, review of systems are positive for back pain, insomnia.   All other systems are reviewed and negative.    PHYSICAL EXAM: VS:  BP 136/72   Pulse 69   Ht _0  (1.93 m)   Wt 229 lb (103.9 kg)   SpO2 96%   BMI 27.87 kg/m  , BMI Body mass index is 27.87 kg/m. GENERAL:  Well appearing NECK:  No jugular venous distention, waveform within normal limits, carotid upstroke brisk and symmetric, no bruits, no thyromegaly LUNGS:  Clear to auscultation bilaterally CHEST:  Unremarkable HEART:  PMI not displaced or sustained,S1 and S2 within normal limits, no S3, no S4, no clicks, no rubs, 3 out of 6 mid to late peaking systolic murmur radiating out the aortic outflow tract, no diastolic murmurs ABD:  Flat, positive bowel sounds normal in frequency in pitch, no bruits, no rebound, no guarding, no midline pulsatile mass, no hepatomegaly, no splenomegaly EXT:  2 plus pulses throughout, no edema, no cyanosis no clubbing   EKG:  EKG is not ordered today.   Recent Labs: 05/18/2020: TSH 1.960    Lipid Panel    Component Value Date/Time   CHOL 178 05/03/2020 1151   TRIG 113 05/03/2020 1151   HDL 63 05/03/2020 1151   CHOLHDL 2.8  05/03/2020 1151   Ursina 95 05/03/2020 1151      Wt Readings from Last 3 Encounters:  07/26/20 229 lb (103.9 kg)  05/02/20 232 lb (105.2 kg)  11/23/19 240 lb (108.9 kg)      Other studies Reviewed: Additional  studies/ records that were reviewed today include: Echocardiogram Review of the above records demonstrates:  Please see elsewhere in the note.     Intervention     ASSESSMENT AND PLAN:  CAD: The patient has no unstable symptoms.  He does have small vessel disease.  He had a submaximal stress test with no Anthony risk findings.  At this point I am going to manage him medically.    HTN: The blood pressure is at target.  No change in therapy.   DYSLIPIDEMIA:    He is now on a PCSK9 inhibitor.  I am going to bring him back in about 4 weeks for cholesterol profile.   AS:   This was severe on echo.  However, he is not having severe symptoms.  He has some very mild stable symptoms.  I am going to bring him back in February for repeat echo which will be 6 months from the previous.  I am going to follow him closely.  COVID EDUCATION:   He has been vaccinated.    We talked about the booster  Current medicines are reviewed at length with the patient today.  The patient does not have concerns regarding medicines.  The following changes have been made:  None  Labs/ tests ordered today include:    Orders Placed This Encounter  Procedures  . Lipid panel  . ECHOCARDIOGRAM COMPLETE     Disposition:   FU with me in Feb.   Signed, Minus Breeding, MD  07/26/2020 2:44 PM    [place]

## 2020-07-26 ENCOUNTER — Encounter: Payer: Self-pay | Admitting: Cardiology

## 2020-07-26 ENCOUNTER — Ambulatory Visit (INDEPENDENT_AMBULATORY_CARE_PROVIDER_SITE_OTHER): Payer: PPO | Admitting: Cardiology

## 2020-07-26 ENCOUNTER — Other Ambulatory Visit: Payer: Self-pay

## 2020-07-26 VITALS — BP 136/72 | HR 69 | Ht 76.0 in | Wt 229.0 lb

## 2020-07-26 DIAGNOSIS — E785 Hyperlipidemia, unspecified: Secondary | ICD-10-CM | POA: Diagnosis not present

## 2020-07-26 DIAGNOSIS — I350 Nonrheumatic aortic (valve) stenosis: Secondary | ICD-10-CM | POA: Diagnosis not present

## 2020-07-26 DIAGNOSIS — I25119 Atherosclerotic heart disease of native coronary artery with unspecified angina pectoris: Secondary | ICD-10-CM | POA: Diagnosis not present

## 2020-07-26 DIAGNOSIS — I10 Essential (primary) hypertension: Secondary | ICD-10-CM

## 2020-07-26 NOTE — Patient Instructions (Signed)
Medication Instructions:  Not needed *If you need a refill on your cardiac medications before your next appointment, please call your pharmacy*   Lab Work: Lipid in 4 weeks - fasting   If you have labs (blood work) drawn today and your tests are completely normal, you will receive your results only by: Marland Kitchen MyChart Message (if you have MyChart) OR . A paper copy in the mail If you have any lab test that is abnormal or we need to change your treatment, we will call you to review the results.   Testing/Procedures:  will be schedule at [address] [facility]  [address]- feb 2022 Your physician has requested that you have an echocardiogram. Echocardiography is a painless test that uses sound waves to create images of your heart. It provides your doctor with information about the size and shape of your heart and how well your heart's chambers and valves are working. This procedure takes approximately one hour. There are no restrictions for this procedure.     Follow-Up: At [facility], you and your health needs are our priority.  As part of our continuing mission to provide you with exceptional heart care, we have created designated Provider Care Teams.  These Care Teams include your primary Cardiologist (physician) and Advanced Practice Providers (APPs -  Physician Assistants and Nurse Practitioners) who all work together to provide you with the care you need, when you need it.  We recommend signing up for the patient portal called "MyChart".  Sign up information is provided on this After Visit Summary.  MyChart is used to connect with patients for Virtual Visits (Telemedicine).  Patients are able to view lab/test results, encounter notes, upcoming appointments, etc.  Non-urgent messages can be sent to your provider as well.   To learn more about what you can do with MyChart, go to [email or web address].    Your next appointment:   5 month(s) feb 2022 after echo   The format for your  next appointment:   In Person  Provider:   Minus Breeding, MD   Other Instructions

## 2020-08-03 DIAGNOSIS — M533 Sacrococcygeal disorders, not elsewhere classified: Secondary | ICD-10-CM | POA: Diagnosis not present

## 2020-08-22 ENCOUNTER — Encounter (HOSPITAL_COMMUNITY): Payer: Self-pay | Admitting: Emergency Medicine

## 2020-08-22 ENCOUNTER — Emergency Department (HOSPITAL_COMMUNITY): Payer: PPO

## 2020-08-22 ENCOUNTER — Inpatient Hospital Stay (HOSPITAL_COMMUNITY)
Admission: EM | Admit: 2020-08-22 | Discharge: 2020-08-31 | DRG: 217 | Disposition: A | Discharge status: Home / Self Care | Payer: PPO | Attending: Surgery | Admitting: Surgery

## 2020-08-22 ENCOUNTER — Other Ambulatory Visit: Payer: Self-pay

## 2020-08-22 ENCOUNTER — Inpatient Hospital Stay (HOSPITAL_COMMUNITY): Payer: PPO

## 2020-08-22 DIAGNOSIS — I200 Unstable angina: Secondary | ICD-10-CM | POA: Diagnosis not present

## 2020-08-22 DIAGNOSIS — Z8551 Personal history of malignant neoplasm of bladder: Secondary | ICD-10-CM

## 2020-08-22 DIAGNOSIS — I252 Old myocardial infarction: Secondary | ICD-10-CM

## 2020-08-22 DIAGNOSIS — R0789 Other chest pain: Secondary | ICD-10-CM | POA: Diagnosis not present

## 2020-08-22 DIAGNOSIS — G4733 Obstructive sleep apnea (adult) (pediatric): Secondary | ICD-10-CM | POA: Diagnosis not present

## 2020-08-22 DIAGNOSIS — I249 Acute ischemic heart disease, unspecified: Secondary | ICD-10-CM

## 2020-08-22 DIAGNOSIS — Z955 Presence of coronary angioplasty implant and graft: Secondary | ICD-10-CM | POA: Diagnosis not present

## 2020-08-22 DIAGNOSIS — I2582 Chronic total occlusion of coronary artery: Secondary | ICD-10-CM | POA: Diagnosis not present

## 2020-08-22 DIAGNOSIS — Z01810 Encounter for preprocedural cardiovascular examination: Secondary | ICD-10-CM | POA: Diagnosis not present

## 2020-08-22 DIAGNOSIS — Z981 Arthrodesis status: Secondary | ICD-10-CM | POA: Diagnosis not present

## 2020-08-22 DIAGNOSIS — R072 Precordial pain: Secondary | ICD-10-CM | POA: Diagnosis not present

## 2020-08-22 DIAGNOSIS — Z881 Allergy status to other antibiotic agents status: Secondary | ICD-10-CM

## 2020-08-22 DIAGNOSIS — G629 Polyneuropathy, unspecified: Secondary | ICD-10-CM | POA: Diagnosis present

## 2020-08-22 DIAGNOSIS — I350 Nonrheumatic aortic (valve) stenosis: Secondary | ICD-10-CM | POA: Diagnosis not present

## 2020-08-22 DIAGNOSIS — I209 Angina pectoris, unspecified: Secondary | ICD-10-CM | POA: Diagnosis present

## 2020-08-22 DIAGNOSIS — Z23 Encounter for immunization: Secondary | ICD-10-CM | POA: Diagnosis not present

## 2020-08-22 DIAGNOSIS — Z888 Allergy status to other drugs, medicaments and biological substances status: Secondary | ICD-10-CM

## 2020-08-22 DIAGNOSIS — Z8546 Personal history of malignant neoplasm of prostate: Secondary | ICD-10-CM

## 2020-08-22 DIAGNOSIS — I499 Cardiac arrhythmia, unspecified: Secondary | ICD-10-CM | POA: Diagnosis not present

## 2020-08-22 DIAGNOSIS — Z79899 Other long term (current) drug therapy: Secondary | ICD-10-CM

## 2020-08-22 DIAGNOSIS — Z8249 Family history of ischemic heart disease and other diseases of the circulatory system: Secondary | ICD-10-CM

## 2020-08-22 DIAGNOSIS — Z952 Presence of prosthetic heart valve: Secondary | ICD-10-CM

## 2020-08-22 DIAGNOSIS — Z79891 Long term (current) use of opiate analgesic: Secondary | ICD-10-CM

## 2020-08-22 DIAGNOSIS — R778 Other specified abnormalities of plasma proteins: Secondary | ICD-10-CM | POA: Diagnosis not present

## 2020-08-22 DIAGNOSIS — N401 Enlarged prostate with lower urinary tract symptoms: Secondary | ICD-10-CM | POA: Diagnosis not present

## 2020-08-22 DIAGNOSIS — I358 Other nonrheumatic aortic valve disorders: Secondary | ICD-10-CM | POA: Diagnosis not present

## 2020-08-22 DIAGNOSIS — E785 Hyperlipidemia, unspecified: Secondary | ICD-10-CM | POA: Diagnosis present

## 2020-08-22 DIAGNOSIS — F32A Depression, unspecified: Secondary | ICD-10-CM | POA: Diagnosis not present

## 2020-08-22 DIAGNOSIS — R9431 Abnormal electrocardiogram [ECG] [EKG]: Secondary | ICD-10-CM | POA: Diagnosis not present

## 2020-08-22 DIAGNOSIS — Z20822 Contact with and (suspected) exposure to covid-19: Secondary | ICD-10-CM | POA: Diagnosis not present

## 2020-08-22 DIAGNOSIS — I10 Essential (primary) hypertension: Secondary | ICD-10-CM | POA: Diagnosis not present

## 2020-08-22 DIAGNOSIS — M2021 Hallux rigidus, right foot: Secondary | ICD-10-CM | POA: Diagnosis not present

## 2020-08-22 DIAGNOSIS — I2510 Atherosclerotic heart disease of native coronary artery without angina pectoris: Secondary | ICD-10-CM

## 2020-08-22 DIAGNOSIS — K219 Gastro-esophageal reflux disease without esophagitis: Secondary | ICD-10-CM | POA: Diagnosis present

## 2020-08-22 DIAGNOSIS — I25110 Atherosclerotic heart disease of native coronary artery with unstable angina pectoris: Secondary | ICD-10-CM | POA: Diagnosis present

## 2020-08-22 DIAGNOSIS — J90 Pleural effusion, not elsewhere classified: Secondary | ICD-10-CM

## 2020-08-22 DIAGNOSIS — Z720 Tobacco use: Secondary | ICD-10-CM

## 2020-08-22 DIAGNOSIS — I352 Nonrheumatic aortic (valve) stenosis with insufficiency: Secondary | ICD-10-CM | POA: Diagnosis not present

## 2020-08-22 DIAGNOSIS — N3289 Other specified disorders of bladder: Secondary | ICD-10-CM | POA: Diagnosis not present

## 2020-08-22 DIAGNOSIS — J9811 Atelectasis: Secondary | ICD-10-CM | POA: Diagnosis not present

## 2020-08-22 DIAGNOSIS — M5000 Cervical disc disorder with myelopathy, unspecified cervical region: Secondary | ICD-10-CM | POA: Diagnosis not present

## 2020-08-22 DIAGNOSIS — E8770 Fluid overload, unspecified: Secondary | ICD-10-CM | POA: Diagnosis not present

## 2020-08-22 DIAGNOSIS — I214 Non-ST elevation (NSTEMI) myocardial infarction: Secondary | ICD-10-CM | POA: Diagnosis not present

## 2020-08-22 DIAGNOSIS — I351 Nonrheumatic aortic (valve) insufficiency: Secondary | ICD-10-CM

## 2020-08-22 DIAGNOSIS — I080 Rheumatic disorders of both mitral and aortic valves: Secondary | ICD-10-CM | POA: Diagnosis not present

## 2020-08-22 DIAGNOSIS — Z79890 Hormone replacement therapy (postmenopausal): Secondary | ICD-10-CM

## 2020-08-22 DIAGNOSIS — Z882 Allergy status to sulfonamides status: Secondary | ICD-10-CM

## 2020-08-22 DIAGNOSIS — R3911 Hesitancy of micturition: Secondary | ICD-10-CM | POA: Diagnosis present

## 2020-08-22 DIAGNOSIS — Z7902 Long term (current) use of antithrombotics/antiplatelets: Secondary | ICD-10-CM

## 2020-08-22 DIAGNOSIS — Z09 Encounter for follow-up examination after completed treatment for conditions other than malignant neoplasm: Secondary | ICD-10-CM

## 2020-08-22 DIAGNOSIS — N390 Urinary tract infection, site not specified: Secondary | ICD-10-CM | POA: Diagnosis not present

## 2020-08-22 DIAGNOSIS — Z951 Presence of aortocoronary bypass graft: Secondary | ICD-10-CM

## 2020-08-22 DIAGNOSIS — R079 Chest pain, unspecified: Secondary | ICD-10-CM | POA: Diagnosis not present

## 2020-08-22 DIAGNOSIS — F419 Anxiety disorder, unspecified: Secondary | ICD-10-CM | POA: Diagnosis present

## 2020-08-22 DIAGNOSIS — I213 ST elevation (STEMI) myocardial infarction of unspecified site: Secondary | ICD-10-CM | POA: Diagnosis not present

## 2020-08-22 DIAGNOSIS — I4510 Unspecified right bundle-branch block: Secondary | ICD-10-CM | POA: Diagnosis present

## 2020-08-22 DIAGNOSIS — I25119 Atherosclerotic heart disease of native coronary artery with unspecified angina pectoris: Secondary | ICD-10-CM | POA: Diagnosis not present

## 2020-08-22 LAB — I-STAT CHEM 8, ED
BUN: 24 mg/dL — ABNORMAL HIGH (ref 8–23)
Calcium, Ion: 1.2 mmol/L (ref 1.15–1.40)
Chloride: 101 mmol/L (ref 98–111)
Creatinine, Ser: 1.4 mg/dL — ABNORMAL HIGH (ref 0.61–1.24)
Glucose, Bld: 100 mg/dL — ABNORMAL HIGH (ref 70–99)
HCT: 53 % — ABNORMAL HIGH (ref 39.0–52.0)
Hemoglobin: 18 g/dL — ABNORMAL HIGH (ref 13.0–17.0)
Potassium: 4.1 mmol/L (ref 3.5–5.1)
Sodium: 136 mmol/L (ref 135–145)
TCO2: 27 mmol/L (ref 22–32)

## 2020-08-22 LAB — HEPARIN LEVEL (UNFRACTIONATED)
Heparin Unfractionated: 0.2 IU/mL — ABNORMAL LOW (ref 0.30–0.70)
Heparin Unfractionated: 0.39 IU/mL (ref 0.30–0.70)

## 2020-08-22 LAB — COMPREHENSIVE METABOLIC PANEL
ALT: 24 U/L (ref 0–44)
AST: 30 U/L (ref 15–41)
Albumin: 3.6 g/dL (ref 3.5–5.0)
Alkaline Phosphatase: 35 U/L — ABNORMAL LOW (ref 38–126)
Anion gap: 10 (ref 5–15)
BUN: 19 mg/dL (ref 8–23)
CO2: 24 mmol/L (ref 22–32)
Calcium: 9.2 mg/dL (ref 8.9–10.3)
Chloride: 102 mmol/L (ref 98–111)
Creatinine, Ser: 1.51 mg/dL — ABNORMAL HIGH (ref 0.61–1.24)
GFR, Estimated: 43 mL/min — ABNORMAL LOW (ref >60)
Glucose, Bld: 103 mg/dL — ABNORMAL HIGH (ref 70–99)
Potassium: 4.1 mmol/L (ref 3.5–5.1)
Sodium: 136 mmol/L (ref 135–145)
Total Bilirubin: 0.9 mg/dL (ref 0.3–1.2)
Total Protein: 5.5 g/dL — ABNORMAL LOW (ref 6.5–8.1)

## 2020-08-22 LAB — CBC WITH DIFFERENTIAL/PLATELET
Abs Immature Granulocytes: 0.07 10*3/uL (ref 0.00–0.07)
Basophils Absolute: 0.1 10*3/uL (ref 0.0–0.1)
Basophils Relative: 1 %
Eosinophils Absolute: 0.2 10*3/uL (ref 0.0–0.5)
Eosinophils Relative: 3 %
HCT: 53.4 % — ABNORMAL HIGH (ref 39.0–52.0)
Hemoglobin: 17.9 g/dL — ABNORMAL HIGH (ref 13.0–17.0)
Immature Granulocytes: 1 %
Lymphocytes Relative: 12 %
Lymphs Abs: 0.8 10*3/uL (ref 0.7–4.0)
MCH: 32.5 pg (ref 26.0–34.0)
MCHC: 33.5 g/dL (ref 30.0–36.0)
MCV: 97.1 fL (ref 80.0–100.0)
Monocytes Absolute: 0.9 10*3/uL (ref 0.1–1.0)
Monocytes Relative: 14 %
Neutro Abs: 4.5 10*3/uL (ref 1.7–7.7)
Neutrophils Relative %: 69 %
Platelets: 187 10*3/uL (ref 150–400)
RBC: 5.5 MIL/uL (ref 4.22–5.81)
RDW: 12.5 % (ref 11.5–15.5)
WBC: 6.5 10*3/uL (ref 4.0–10.5)
nRBC: 0 % (ref 0.0–0.2)

## 2020-08-22 LAB — LIPID PANEL
Cholesterol: 136 mg/dL (ref 0–200)
HDL: 53 mg/dL (ref >40)
LDL Cholesterol: 68 mg/dL (ref 0–99)
Total CHOL/HDL Ratio: 2.6 RATIO
Triglycerides: 73 mg/dL (ref <150)
VLDL: 15 mg/dL (ref 0–40)

## 2020-08-22 LAB — ECHOCARDIOGRAM COMPLETE
AR max vel: 0.65 cm2
AV Area VTI: 0.63 cm2
AV Area mean vel: 0.63 cm2
AV Mean grad: 42 mmHg
AV Peak grad: 69.6 mmHg
Ao pk vel: 4.17 m/s
Area-P 1/2: 2.62 cm2
Height: 76 in
P 1/2 time: 365 msec
S' Lateral: 4.27 cm
Weight: 3680 oz

## 2020-08-22 LAB — TROPONIN I (HIGH SENSITIVITY)
Troponin I (High Sensitivity): 247 ng/L (ref <18)
Troponin I (High Sensitivity): 736 ng/L (ref <18)
Troponin I (High Sensitivity): 1625 ng/L (ref <18)

## 2020-08-22 LAB — RESPIRATORY PANEL BY RT PCR (FLU A&B, COVID)
Influenza A by PCR: NEGATIVE
Influenza B by PCR: NEGATIVE
SARS Coronavirus 2 by RT PCR: NEGATIVE

## 2020-08-22 LAB — HEMOGLOBIN A1C
Hgb A1c MFr Bld: 5.6 % (ref 4.8–5.6)
Mean Plasma Glucose: 114.02 mg/dL

## 2020-08-22 LAB — APTT: aPTT: 31 seconds (ref 24–36)

## 2020-08-22 LAB — PROTIME-INR
INR: 1.1 (ref 0.8–1.2)
Prothrombin Time: 13.3 seconds (ref 11.4–15.2)

## 2020-08-22 MED ORDER — ONDANSETRON HCL 4 MG/2ML IJ SOLN
4.0000 mg | Freq: Once | INTRAMUSCULAR | Status: AC
  Administered 2020-08-22: 4 mg via INTRAVENOUS
  Filled 2020-08-22: qty 2

## 2020-08-22 MED ORDER — ASPIRIN EC 81 MG PO TBEC: 81.0000 mg | DELAYED_RELEASE_TABLET | Freq: Every day | ORAL | Status: DC

## 2020-08-22 MED ORDER — FAMOTIDINE 20 MG PO TABS: 20.0000 mg | ORAL_TABLET | Freq: Two times a day (BID) | ORAL | Status: DC | PRN

## 2020-08-22 MED ORDER — HEPARIN (PORCINE) 25000 UT/250ML-% IV SOLN
1400.0000 [IU]/h | INTRAVENOUS | Status: DC
  Administered 2020-08-22: 1250 [IU]/h via INTRAVENOUS
  Administered 2020-08-23: 1400 [IU]/h via INTRAVENOUS
  Filled 2020-08-22 (×2): qty 250

## 2020-08-22 MED ORDER — ALPRAZOLAM 0.5 MG PO TABS
0.5000 mg | ORAL_TABLET | Freq: Every day | ORAL | Status: DC
  Administered 2020-08-22 – 2020-08-25 (×4): 0.5 mg via ORAL
  Filled 2020-08-22: qty 2
  Filled 2020-08-22 (×3): qty 1

## 2020-08-22 MED ORDER — POLYETHYL GLYCOL-PROPYL GLYCOL 0.4-0.3 % OP SOLN: 1.0000 [drp] | Freq: Three times a day (TID) | OPHTHALMIC | Status: DC | PRN

## 2020-08-22 MED ORDER — FENTANYL CITRATE (PF) 100 MCG/2ML IJ SOLN
50.0000 ug | Freq: Once | INTRAMUSCULAR | Status: AC
  Administered 2020-08-22: 50 ug via INTRAVENOUS
  Filled 2020-08-22: qty 2

## 2020-08-22 MED ORDER — SODIUM CHLORIDE 0.9 % WEIGHT BASED INFUSION
3.0000 mL/kg/h | INTRAVENOUS | Status: AC
  Administered 2020-08-22: 3 mL/kg/h via INTRAVENOUS

## 2020-08-22 MED ORDER — TRAMADOL HCL 50 MG PO TABS: 50.0000 mg | ORAL_TABLET | Freq: Four times a day (QID) | ORAL | Status: DC | PRN

## 2020-08-22 MED ORDER — POLYVINYL ALCOHOL 1.4 % OP SOLN
1.0000 [drp] | Freq: Three times a day (TID) | OPHTHALMIC | Status: DC | PRN
  Administered 2020-08-24: 1 [drp] via OPHTHALMIC
  Filled 2020-08-22: qty 15

## 2020-08-22 MED ORDER — SODIUM CHLORIDE 0.9% FLUSH
3.0000 mL | Freq: Two times a day (BID) | INTRAVENOUS | Status: DC
  Administered 2020-08-23 – 2020-08-25 (×5): 3 mL via INTRAVENOUS

## 2020-08-22 MED ORDER — HEPARIN SODIUM (PORCINE) 5000 UNIT/ML IJ SOLN
INTRAMUSCULAR | Status: AC
  Administered 2020-08-22: 4000 [IU] via INTRAVENOUS
  Filled 2020-08-22: qty 1

## 2020-08-22 MED ORDER — ISOSORBIDE MONONITRATE ER 60 MG PO TB24
60.0000 mg | ORAL_TABLET | Freq: Every day | ORAL | Status: DC
  Administered 2020-08-23 – 2020-08-25 (×3): 60 mg via ORAL
  Filled 2020-08-22: qty 1
  Filled 2020-08-22: qty 2
  Filled 2020-08-22 (×2): qty 1

## 2020-08-22 MED ORDER — NITROGLYCERIN 0.4 MG SL SUBL
SUBLINGUAL_TABLET | SUBLINGUAL | Status: AC
  Administered 2020-08-22: 1 mg
  Filled 2020-08-22: qty 1

## 2020-08-22 MED ORDER — SODIUM CHLORIDE 0.9% FLUSH: 3.0000 mL | INTRAVENOUS | Status: DC | PRN

## 2020-08-22 MED ORDER — NITROGLYCERIN 0.4 MG SL SUBL
0.4000 mg | SUBLINGUAL_TABLET | SUBLINGUAL | Status: DC | PRN
  Administered 2020-08-23: 0.4 mg via SUBLINGUAL
  Filled 2020-08-22: qty 1

## 2020-08-22 MED ORDER — ASPIRIN EC 81 MG PO TBEC
81.0000 mg | DELAYED_RELEASE_TABLET | Freq: Every day | ORAL | Status: DC
  Administered 2020-08-22 – 2020-08-25 (×4): 81 mg via ORAL
  Filled 2020-08-22 (×4): qty 1

## 2020-08-22 MED ORDER — BEMPEDOIC ACID 180 MG PO TABS: 180.0000 mg | ORAL_TABLET | Freq: Every day | ORAL | Status: DC

## 2020-08-22 MED ORDER — BUPROPION HCL ER (XL) 150 MG PO TB24
300.0000 mg | ORAL_TABLET | Freq: Every day | ORAL | Status: DC
  Administered 2020-08-22 – 2020-08-25 (×4): 300 mg via ORAL
  Filled 2020-08-22 (×4): qty 2

## 2020-08-22 MED ORDER — SODIUM CHLORIDE 0.9% FLUSH
3.0000 mL | Freq: Two times a day (BID) | INTRAVENOUS | Status: DC
  Administered 2020-08-22 – 2020-08-25 (×6): 3 mL via INTRAVENOUS

## 2020-08-22 MED ORDER — SODIUM CHLORIDE 0.9 % IV SOLN: INTRAVENOUS | Status: DC

## 2020-08-22 MED ORDER — ACETAMINOPHEN 325 MG PO TABS: 650.0000 mg | ORAL_TABLET | ORAL | Status: DC | PRN

## 2020-08-22 MED ORDER — CARVEDILOL 6.25 MG PO TABS
6.2500 mg | ORAL_TABLET | Freq: Two times a day (BID) | ORAL | Status: DC
  Administered 2020-08-22 – 2020-08-25 (×7): 6.25 mg via ORAL
  Filled 2020-08-22 (×7): qty 1

## 2020-08-22 MED ORDER — HEPARIN SODIUM (PORCINE) 5000 UNIT/ML IJ SOLN: 4000.0000 [IU] | Freq: Once | INTRAMUSCULAR | Status: AC

## 2020-08-22 MED ORDER — HEPARIN (PORCINE) 25000 UT/250ML-% IV SOLN: 12.0000 [IU]/kg/h | INTRAVENOUS | Status: DC

## 2020-08-22 MED ORDER — ASPIRIN 81 MG PO CHEW: 324.0000 mg | CHEWABLE_TABLET | Freq: Once | ORAL | Status: DC

## 2020-08-22 MED ORDER — SODIUM CHLORIDE 0.9 % IV SOLN: 250.0000 mL | INTRAVENOUS | Status: DC | PRN

## 2020-08-22 MED ORDER — SODIUM CHLORIDE 0.9 % WEIGHT BASED INFUSION
1.0000 mL/kg/h | INTRAVENOUS | Status: DC
  Administered 2020-08-22 – 2020-08-23 (×2): 1 mL/kg/h via INTRAVENOUS

## 2020-08-22 MED ORDER — ONDANSETRON HCL 4 MG/2ML IJ SOLN: 4.0000 mg | Freq: Four times a day (QID) | INTRAMUSCULAR | Status: DC | PRN

## 2020-08-22 NOTE — ED Notes (Signed)
Consent signed for cath lab.

## 2020-08-22 NOTE — ED Notes (Signed)
Lunch Tray Ordered @ 1406-[address] Claiborne Billings, RN called by Levada Dy

## 2020-08-22 NOTE — ED Provider Notes (Signed)
[facility] EMERGENCY DEPARTMENT Provider Note   CSN: [id number] Arrival date & time: 08/22/20  0532     History Chief Complaint  Patient presents with  . Code STEMI    RASHAAN WYLES is a 79 y.o. male.  The history is provided by the patient.  Chest Pain Pain location:  Substernal area Pain quality: dull   Radiates to: left chest  Pain severity:  Severe Onset quality:  Sudden Duration:  30 minutes Timing:  Constant Progression:  Improving Chronicity:  Recurrent Context: at rest   Worsened by:  Nothing Ineffective treatments:  Nitroglycerin Associated symptoms: diaphoresis   Associated symptoms: no cough, no dizziness, no nausea and no vomiting   Risk factors: coronary artery disease and male sex        Past Medical History:  Diagnosis Date  . Anxiety   . Arthritis   . Bladder cancer ([place])   . Bladder stone   . BPH (benign prostatic hypertrophy)   . Depression   . GERD (gastroesophageal reflux disease)   . History of bladder cancer    CARCINOMA IN SITU OF BLADDER  . History of kidney stones   . History of myocardial infarction    07-17-2011--   MI W/ OCCLUDED OM1. 2017 occluded RCA treated with a bare-metal stent, high-grade ramus intermediate stenosis in 2018 with failed attempted PCI  . Hyperlipidemia   . Hypertension   . Myocardial infarction [facility]) 2012; 2017   2012 - noted Subtotal CTO of OM/RI;  09/2016 - Acute Infer STEMI - BMS x 2 mRCA (3.5 mm x 15 & 3.5 x 12 - tapered 4.0 - 3.8 mm)  . OSA (obstructive sleep apnea)    cpap non-compliant   . Prostate cancer [facility])     Patient Active Problem List   Diagnosis Date Noted  . Nonrheumatic aortic valve stenosis 11/22/2019  . Educated about COVID-19 virus infection 11/22/2019  . CAD (coronary artery disease), native coronary artery 04/22/2019  . Angina, class II ([place]) - New onset 04/22/2019  . Hyperlipidemia with target LDL less than 70; statin intolerant.  Now on Repatha 04/22/2019   . S/P lumbar laminectomy 04/04/2017  . Malignant neoplasm of prostate ([place]) 03/08/2016  . Complicated UTI (urinary tract infection) 04/21/2013  . Essential hypertension 04/21/2013  . Urinary bladder stone 04/21/2013    Past Surgical History:  Procedure Laterality Date  . ARTHRODESIS METATARSALPHALANGEAL JOINT (MTPJ) Right 02/20/2018   Procedure: Right Hallux Metatarsal Phalangeal Joint Arthrodesis; Right Silver Bunionectomy;  Surgeon: Wylene Simmer, MD;  Location: [facility];  Service: Orthopedics;  Laterality: Right;  . BACK SURGERY    . CARDIOVASCULAR STRESS TEST  04/22/11  . CORONARY ANGIOPLASTY  03/2017    failed PCI RI (OM1) 2018 -- similar to 2012.  . CORONARY STENT INTERVENTION  09/2016   [facility] (Acute Inferior STEMI)- Acute mid RCA 100% --> Integrity BMS 3.5 mm x 56m (3.8 mm) & 3.5 mm x 12 mm (4.1 mm) --BMS used because of need for back surgery  . CORONARY STENT INTERVENTION N/A 05/01/2019   Procedure: CORONARY STENT INTERVENTION;  Surgeon: VJettie Booze MD;  [facility];  Service: Cardiovascular;  Laterality: N/A;  . CYSTO/ BLADDER BX'S  X2  2001  / X1  2003  . CYSTO/ BLADDER BX'S/ BILATERAL RETROGRADE URETEROPYLEGRAM  2003; 2007; 2008;  12-30-2007   CARCINOMA IN SITU OF BLADDER  . CYSTOSCOPY WITH LITHOLAPAXY N/A 05/07/2013   Procedure: CYSTOSCOPY WITH  LITHOLAPAXY;  Surgeon: Franchot Gallo, MD;  Location: [facility];  Service: Urology;  Laterality: N/A;  . EHL Lengthening Right 05/10/2017   RT FOOT  . Hammer Toe Repair Right 05/10/2017   Rt #2 Toe  . HAMMERTOE RECONSTRUCTION WITH WEIL OSTEOTOMY Right 02/20/2018   Procedure: Right Second Metatarsal Priscille Heidelberg and Revision Hammertoe Correction;  Surgeon: Wylene Simmer, MD;  Location: [facility];  Service: Orthopedics;  Laterality: Right;  . LEFT HEART CATH AND CORONARY ANGIOGRAPHY  07/17/2011   OCCLUDED OM1 WITH UNSUCCESSFUL PCI ATTEMPT, O/W MILD TO MODERATE  DISEASE, DR ANDY CHIU ([facility])    . LEFT HEART CATH AND CORONARY ANGIOGRAPHY  09/2016   Acute Inferior STEMI ([facility])  - Mid LAD 30%, D2 50%.  OM 1/RI 100%, mid circumflex 30%.  Acute mRCA 100% (thrombotic) - BMS PCI.  EF 50% with inferobasal hypokinesis.  Marland Kitchen LEFT HEART CATH AND CORONARY ANGIOGRAPHY  03/2017   (Dr. Chuck Hint - [facility]): OM(RI) ~ CTO (unfixable PCI unable to cross with wire/balloon. pLAD 40%, ost D1 40%;  prox-mid RCA 15%.  Mild stenosis with small mid LCx.  EF 60%.  Marland Kitchen LEFT HEART CATH AND CORONARY ANGIOGRAPHY N/A 05/01/2019   Procedure: LEFT HEART CATH AND CORONARY ANGIOGRAPHY;  Surgeon: Jettie Booze, MD;  Location: [facility];  Service: Cardiovascular;  Laterality: N/A;  . LUMBAR DECOMPRESSION FORAMINOTOMY L3 -- L5/  REMOVAL CYST L4-5 FACET JOINT  08-21-2009   ALSO HAD PRIOR BACK SURG IN 1992  . LUMBAR LAMINECTOMY  04/04/2017   L2-3  . LUMBAR LAMINECTOMY/DECOMPRESSION MICRODISCECTOMY N/A 04/04/2017   Procedure: Laminectomy and Foraminotomy - Lumbar Two-Three;  Surgeon: Eustace Moore, MD;  Location: [facility];  Service: Neurosurgery;  Laterality: N/A;  . OPEN TENOTOMY OF FLEXOR 2ND AND 3RD RIGHT TOES  Svara Twyman 2008  . PROSTATE BIOPSY    . TRANSTHORACIC ECHOCARDIOGRAM  07/17/2011   at [facility], MILD TO MODERATE CONCENTRIC LVH/ NORMAL LVSF/ EF 55-60%/ MILD AORTIC STENOSIS  . TRANSTHORACIC ECHOCARDIOGRAM  06/2018   (Onset): Normal LV function/thickness.  EF 50 to 55%.  Normal wall motion.  Mild to moderate MAC.  Moderate to Severe Aortic Stenosis (calcified).  Mild aortic insufficiency.  . TRANSURETHRAL RESECTION OF BLADDER TUMOR  01-10-2004  . TRANSURETHRAL RESECTION OF PROSTATE N/A 05/07/2013   Procedure: TRANSURETHRAL RESECTION OF THE PROSTATE WITH GYRUS INSTRUMENTS;  Surgeon: Franchot Gallo, MD;  Location: [facility];  Service: Urology;  Laterality: N/A;       Family History  Problem Relation Age of Onset  . Cancer Father    . Cancer Mother        breast  . Heart failure Mother     Social History   Tobacco Use  . Smoking status: Former Smoker    Packs/day: 0.50    Years: 50.00    Pack years: 25.00    Types: Cigarettes    Quit date: 04/15/2010    Years since quitting: 10.3  . Smokeless tobacco: Current User    Types: Snuff  . Tobacco comment: occasional  snuff pack  Vaping Use  . Vaping Use: Never used  Substance Use Topics  . Alcohol use: Yes    Alcohol/week: 2.0 standard drinks    Types: 2 Cans of beer per week    Comment: social  . Drug use: No    Home Medications Prior to Admission medications   Medication Sig Start Date End Date Taking? Authorizing Provider  acetaminophen (TYLENOL) 500 MG tablet Take 500 mg by mouth every 6 (six) hours as needed (pain.).    [provider]  ALPRAZolam Duanne Moron) 1 MG tablet Take 0.5 mg by mouth at bedtime. 02/28/19   [provider]  Bempedoic Acid (NEXLETOL) 180 MG TABS Take 180 mg by mouth daily. 05/27/20   Minus Breeding, MD  buPROPion (WELLBUTRIN XL) 300 MG 24 hr tablet Take 300 mg by mouth daily.    [provider]  carvedilol (COREG) 6.25 MG tablet Take 1 tablet (6.25 mg total) by mouth 2 (two) times daily with a meal. 03/04/20   Minus Breeding, MD  cefUROXime (CEFTIN) 500 MG tablet Take 500 mg by mouth 2 (two) times daily. 04/05/20   [provider]  clopidogrel (PLAVIX) 75 MG tablet TAKE 1 TABLET BY MOUTH EVERY DAY 10/19/19   Minus Breeding, MD  doxycycline (ADOXA) 100 MG tablet     [provider]  Evolocumab (REPATHA SURECLICK) 704 MG/ML SOAJ Inject 140 mg into the skin every 14 (fourteen) days. 11/23/19   Minus Breeding, MD  famotidine (PEPCID) 20 MG tablet Take 20 mg by mouth 2 (two) times daily as needed (acid reflux/indigestion.).  09/10/18   [provider]  HYDROcodone-acetaminophen (NORCO/VICODIN) 5-325 MG tablet Take 1 tablet by mouth 3 (three) times daily as needed. Patient not taking:  Reported on 07/26/2020 10/13/19   [provider]  isosorbide mononitrate (IMDUR) 60 MG 24 hr tablet TAKE 1 TABLET BY MOUTH ONCE (1) DAILY 07/15/20   Minus Breeding, MD  losartan (COZAAR) 25 MG tablet Take 1 tablet (25 mg total) by mouth daily. 04/05/20   Minus Breeding, MD  meloxicam (MOBIC) 7.5 MG tablet Take 1 tablet (7.5 mg total) by mouth daily as needed for pain. 05/02/20   Minus Breeding, MD  Multiple Vitamin (MULTIVITAMIN WITH MINERALS) TABS tablet Take 1 tablet by mouth daily.    [provider]  nitroGLYCERIN (NITROSTAT) 0.4 MG SL tablet Place 1 tablet (0.4 mg total) under the tongue every 5 (five) minutes as needed for chest pain. 04/20/20   Minus Breeding, MD  Polyethyl Glycol-Propyl Glycol (LUBRICANT EYE DROPS) 0.4-0.3 % SOLN Place 1 drop into both eyes 3 (three) times daily as needed (dry/irritated eyes).    [provider]  testosterone cypionate (DEPOTESTOSTERONE CYPIONATE) 200 MG/ML injection Inject 120 mg into the muscle once a week. 03/27/19   [provider]  traMADol (ULTRAM) 50 MG tablet Take 50 mg by mouth every 6 (six) hours as needed (severe pain.).     [provider]  valACYclovir (VALTREX) 1000 MG tablet Take 1,000 mg by mouth 3 (three) times daily as needed (fever blisters).  03/03/17   [provider]    Allergies    Statins, Augmentin [amoxicillin-pot clavulanate], Dexamethasone, and Sulfa antibiotics  Review of Systems   Review of Systems  Constitutional: Positive for diaphoresis.  HENT: Negative for congestion.   Eyes: Negative for visual disturbance.  Respiratory: Negative for cough.   Cardiovascular: Positive for chest pain.  Gastrointestinal: Negative for nausea and vomiting.  Genitourinary: Negative for difficulty urinating.  Musculoskeletal: Negative for arthralgias.  Skin: Negative for color change.  Neurological: Negative for dizziness.  Psychiatric/Behavioral: Negative for agitation.  All other  systems reviewed and are negative.   Physical Exam Updated Vital Signs BP (!) 152/93   Pulse 66   Resp 20   Ht _0  (1.93 m)   Wt 104.3 kg   SpO2 96%   BMI 28.00  kg/m   Physical Exam Vitals and nursing note reviewed.  Constitutional:      General: He is not in acute distress.    Appearance: Normal appearance.  HENT:     Head: Normocephalic and atraumatic.     Nose: Nose normal.  Eyes:     Conjunctiva/sclera: Conjunctivae normal.     Pupils: Pupils are equal, round, and reactive to light.  Cardiovascular:     Rate and Rhythm: Normal rate and regular rhythm.     Pulses: Normal pulses.     Heart sounds: Normal heart sounds.  Pulmonary:     Effort: Pulmonary effort is normal.     Breath sounds: Normal breath sounds.  Abdominal:     General: Abdomen is flat. Bowel sounds are normal.     Palpations: Abdomen is soft.     Tenderness: There is no abdominal tenderness. There is no guarding.  Musculoskeletal:        General: Normal range of motion.     Cervical back: Normal range of motion and neck supple.  Skin:    General: Skin is warm and dry.     Capillary Refill: Capillary refill takes less than 2 seconds.  Neurological:     General: No focal deficit present.     Mental Status: He is alert and oriented to person, place, and time.     Deep Tendon Reflexes: Reflexes normal.  Psychiatric:        Mood and Affect: Mood normal.        Behavior: Behavior normal.     ED Results / Procedures / Treatments   Labs (all labs ordered are listed, but only abnormal results are displayed) Results for orders placed or performed during the hospital encounter of 08/22/20  Respiratory Panel by RT PCR (Flu A&B, Covid) - Nasopharyngeal Swab   Specimen: Nasopharyngeal Swab  Result Value Ref Range   SARS Coronavirus 2 by RT PCR NEGATIVE NEGATIVE   Influenza A by PCR NEGATIVE NEGATIVE   Influenza B by PCR NEGATIVE NEGATIVE  CBC with Differential/Platelet  Result Value Ref Range   WBC  6.5 4.0 - 10.5 K/uL   RBC 5.50 4.22 - 5.81 MIL/uL   Hemoglobin 17.9 (H) 13.0 - 17.0 g/dL   HCT 53.4 (H) 39 - 52 %   MCV 97.1 80.0 - 100.0 fL   MCH 32.5 26.0 - 34.0 pg   MCHC 33.5 30.0 - 36.0 g/dL   RDW 12.5 11.5 - 15.5 %   Platelets 187 150 - 400 K/uL   nRBC 0.0 0.0 - 0.2 %   Neutrophils Relative % 69 %   Neutro Abs 4.5 1.7 - 7.7 K/uL   Lymphocytes Relative 12 %   Lymphs Abs 0.8 0.7 - 4.0 K/uL   Monocytes Relative 14 %   Monocytes Absolute 0.9 0.1 - 1.0 K/uL   Eosinophils Relative 3 %   Eosinophils Absolute 0.2 0 - 0 K/uL   Basophils Relative 1 %   Basophils Absolute 0.1 0 - 0 K/uL   Immature Granulocytes 1 %   Abs Immature Granulocytes 0.07 0.00 - 0.07 K/uL  Protime-INR  Result Value Ref Range   Prothrombin Time 13.3 11.4 - 15.2 seconds   INR 1.1 0.8 - 1.2  APTT  Result Value Ref Range   aPTT 31 24 - 36 seconds  Comprehensive metabolic panel  Result Value Ref Range   Sodium 136 135 - 145 mmol/L   Potassium 4.1 3.5 - 5.1 mmol/L   Chloride  102 98 - 111 mmol/L   CO2 24 22 - 32 mmol/L   Glucose, Bld 103 (H) 70 - 99 mg/dL   BUN 19 8 - 23 mg/dL   Creatinine, Ser 1.51 (H) 0.61 - 1.24 mg/dL   Calcium 9.2 8.9 - 10.3 mg/dL   Total Protein 5.5 (L) 6.5 - 8.1 g/dL   Albumin 3.6 3.5 - 5.0 g/dL   AST 30 15 - 41 U/L   ALT 24 0 - 44 U/L   Alkaline Phosphatase 35 (L) 38 - 126 U/L   Total Bilirubin 0.9 0.3 - 1.2 mg/dL   GFR, Estimated 43 (L) >60 mL/min   Anion gap 10 5 - 15  Lipid panel  Result Value Ref Range   Cholesterol 136 0 - 200 mg/dL   Triglycerides 73 <150 mg/dL   HDL 53 >40 mg/dL   Total CHOL/HDL Ratio 2.6 RATIO   VLDL 15 0 - 40 mg/dL   LDL Cholesterol 68 0 - 99 mg/dL  I-stat chem 8, ED (not at [facility] or [facility])  Result Value Ref Range   Sodium 136 135 - 145 mmol/L   Potassium 4.1 3.5 - 5.1 mmol/L   Chloride 101 98 - 111 mmol/L   BUN 24 (H) 8 - 23 mg/dL   Creatinine, Ser 1.40 (H) 0.61 - 1.24 mg/dL   Glucose, Bld 100 (H) 70 - 99 mg/dL   Calcium, Ion 1.20 1.15 - 1.40  mmol/L   TCO2 27 22 - 32 mmol/L   Hemoglobin 18.0 (H) 13.0 - 17.0 g/dL   HCT 53.0 (H) 39 - 52 %  Troponin I (High Sensitivity)  Result Value Ref Range   Troponin I (High Sensitivity) 247 (HH) <18 ng/L   DG Chest Portable 1 View  Result Date: 08/22/2020 CLINICAL DATA:  Midsternal chest pain. EXAM: PORTABLE CHEST 1 VIEW COMPARISON:  04/05/2020 FINDINGS: 0549 hours. The lungs are clear without focal pneumonia, edema, pneumothorax or pleural effusion. Interstitial markings are diffusely coarsened with chronic features. Cardiopericardial silhouette is at upper limits of normal for size. The visualized bony structures of the thorax show no acute abnormality. Telemetry leads overlie the chest. IMPRESSION: No active disease. Electronically Signed   By: Misty Stanley M.D.   On: 08/22/2020 06:15    EKG EKG Interpretation  Date/Time:  Monday August 22 2020 05:37:33 EDT Ventricular Rate:  64 PR Interval:    QRS Duration: 124 QT Interval:  431 QTC Calculation: 445 R Axis:   78 Text Interpretation: Sinus rhythm Consider right atrial enlargement IVCD, consider atypical RBBB ischemia, lateral leads Minimal ST elevation, anterior leads Confirmed by Randal Buba, Jaze Rodino ([id number]) on 08/22/2020 5:43:53 AM   Radiology No results found.  Procedures Procedures (including critical care time)  Medications Ordered in ED Medications  0.9 %  sodium chloride infusion (has no administration in time range)  aspirin chewable tablet 324 mg (324 mg Oral Not Given 08/22/20 0542)  heparin injection 4,000 Units (has no administration in time range)  nitroGLYCERIN (NITROSTAT) 0.4 MG SL tablet (has no administration in time range)  fentaNYL (SUBLIMAZE) injection 50 mcg (has no administration in time range)  ondansetron (ZOFRAN) injection 4 mg (has no administration in time range)  heparin ADULT infusion 100 units/mL (25000 units/253m sodium chloride 0.45%) (has no administration in time range)    ED Course  I have  reviewed the triage vital signs and the nursing notes.  Pertinent labs & imaging results that were available during my care of the patient were reviewed by  me and considered in my medical decision making (see chart for details).      MDM Reviewed: previous chart, nursing note and vitals Reviewed previous: ECG Interpretation: labs, ECG and x-ray (elevated troponin normal white count, normal chest Xray by me ) Total time providing critical care: 30-74 minutes (heparin drip ). This excludes time spent performing separately reportable procedures and services. Consults: cardiology  CRITICAL CARE Performed by: Nea Gittens K Darric Plante-Rasch Total critical care time: 60 minutes Critical care time was exclusive of separately billable procedures and treating other patients. Critical care was necessary to treat or prevent imminent or life-threatening deterioration. Critical care was time spent personally by me on the following activities: development of treatment plan with patient and/or surrogate as well as nursing, discussions with consultants, evaluation of patient's response to treatment, examination of patient, obtaining history from patient or surrogate, ordering and performing treatments and interventions, ordering and review of laboratory studies, ordering and review of radiographic studies, pulse oximetry and re-evaluation of patient's condition.  Pain is 0/10 at this time and patient is resting comfortably awaiting cardiology  Final Clinical Impression(s) / ED Diagnoses Final diagnoses:  Unstable angina [facility])   Admit to cardiology    Merril Isakson, MD 08/22/20 [id number]

## 2020-08-22 NOTE — H&P (Addendum)
Cardiology Admission History and Physical:   Patient ID: Anthony Hartman MRN: [id number]; DOB: [birth year]   Admission date: 08/22/2020  Primary Care Provider: Myrlene Broker, MD [facility] Cardiologist: Minus Breeding, MD  [facility] Electrophysiologist:  None   Chief Complaint:  Chest pain  Patient Profile:   Anthony Hartman is a 79 y.o. male with a PMH of CAD (first MI in 2012 with unsuccessful PCI attempt to OM1, s/p BMS to RCA in 2017, with subsequent PCI to 2nd diagonal 04/2019), severe AS, HTN, HLD, OSA, GERD, BPH, and bladder cancer, who presented with chest pain.   History of Present Illness:   Anthony Hartman was in his usual state of health around ~4am this morning when he was awoken from sleep with sharp substernal chest pain. He initially took 1/2 an imdur dose with minimal improvement in his chest pain, however after no significant improvement with taking an addition imdur he activated EMS. He denied any radiation of chest pain or associated SOB, diaphoresis, nausea, vomiting, dizziness, or lightheadedness. He was given Aspirin and SL nitro en route with improvement in chest pain, ultimately resolved upon receiving IV fentanyl here.   He was last evaluated by cardiology at an outpatient visit with Dr. Percival Spanish 07/26/20, at which time he was doing okay from a cardiac standpoint. He noticed some chest discomfort on days he forgot to take his imdur, but otherwise was without anginal complaints. No medication changes occurred and he was recommended for a follow-up echocardiogram 12/2020 for close monitoring of his severe aortic stenosis. His last echocardiogram 05/2020 showed EF 55-60%, no RWMA, moderate concentric LVH, G1DD, moderate LAE, moderate AI, and severe AS. His last ischemic evaluation was an ETT 05/2020 which showed no high risk findings, though was a submaximal stress test. Prior to this, his last heart catheterization 04/2019 showed 95% 2nd diagonal stenosis managed with  balloon angioplasty, with medically managed patent RCA stent with 25% ISR, 99% OM1 stenosis (essentially a CTO), 25% mLCx stenosis, 25% pLAD stenosis, 50% mLAD stenosis, with EF 55-65% and moderate AS.  At the time of my evaluation he is chest pain free. He reports chest pain felt similar to prior episodes where he needed PCI. He denies any recent exertional chest pain or changes in his chronic DOE. He has some intermittent swelling in his feet which is not bothersome. No orthopnea or PND. He reports compliance with his CPAP. No complaints of palpitations or syncope.   ED course:  BP initially elevated, now normotensive, labile respirations, otherwise VSS. Labs notable for electrolytes wnl, Cr 1.5>1.4, Hgb 18, PLT 187, LDL 68, HsTrop 247. EKG with sinus rhythm, rate 64 bpm, chronic RBBB, new inferolateral TWI, submm STE in aVR, V1-3. CXR without acute findings. Respiratory panel negative for influenza/COVID-19. He was given SL nitro x2 and aspirin en route to the ED and  IV fentanyl with resolution of chest pain upon arrival. Started on a heparin gtt. Cardiology asked to evaluate.   Past Medical History:  Diagnosis Date  . Anxiety   . Arthritis   . Bladder cancer ([place])   . Bladder stone   . BPH (benign prostatic hypertrophy)   . Depression   . GERD (gastroesophageal reflux disease)   . History of bladder cancer    CARCINOMA IN SITU OF BLADDER  . History of kidney stones   . History of myocardial infarction    07-17-2011--   MI W/ OCCLUDED OM1. 2017 occluded RCA treated with a bare-metal stent, high-grade  ramus intermediate stenosis in 2018 with failed attempted PCI  . Hyperlipidemia   . Hypertension   . Myocardial infarction [facility]) 2012; 2017   2012 - noted Subtotal CTO of OM/RI;  09/2016 - Acute Infer STEMI - BMS x 2 mRCA (3.5 mm x 15 & 3.5 x 12 - tapered 4.0 - 3.8 mm)  . OSA (obstructive sleep apnea)    cpap non-compliant   . Prostate cancer [facility])     Past Surgical History:  Procedure  Laterality Date  . ARTHRODESIS METATARSALPHALANGEAL JOINT (MTPJ) Right 02/20/2018   Procedure: Right Hallux Metatarsal Phalangeal Joint Arthrodesis; Right Silver Bunionectomy;  Surgeon: Wylene Simmer, MD;  Location: [place];  Service: Orthopedics;  Laterality: Right;  . BACK SURGERY    . CARDIOVASCULAR STRESS TEST  04/22/11  . CORONARY ANGIOPLASTY  03/2017    failed PCI RI (OM1) 2018 -- similar to 2012.  . CORONARY STENT INTERVENTION  09/2016   [facility] (Acute Inferior STEMI)- Acute mid RCA 100% --> Integrity BMS 3.5 mm x 1m (3.8 mm) & 3.5 mm x 12 mm (4.1 mm) --BMS used because of need for back surgery  . CORONARY STENT INTERVENTION N/A 05/01/2019   Procedure: CORONARY STENT INTERVENTION;  Surgeon: VJettie Booze MD;  [place] [facility];  Service: Cardiovascular;  Laterality: N/A;  . CYSTO/ BLADDER BX'S  X2  2001  / X1  2003  . CYSTO/ BLADDER BX'S/ BILATERAL RETROGRADE URETEROPYLEGRAM  2003; 2007; 2008;  12-30-2007   CARCINOMA IN SITU OF BLADDER  . CYSTOSCOPY WITH LITHOLAPAXY N/A 05/07/2013   Procedure: CYSTOSCOPY WITH LITHOLAPAXY;  Surgeon: SFranchot Gallo MD;  [facility];  Service: Urology;  Laterality: N/A;  . EHL Lengthening Right 05/10/2017   RT FOOT  . Hammer Toe Repair Right 05/10/2017   Rt #2 Toe  . HAMMERTOE RECONSTRUCTION WITH WEIL OSTEOTOMY Right 02/20/2018   Procedure: Right Second Metatarsal WPriscille Heidelbergand Revision Hammertoe Correction;  Surgeon: HWylene Simmer MD;  [place]  Service: Orthopedics;  Laterality: Right;  . LEFT HEART CATH AND CORONARY ANGIOGRAPHY  07/17/2011   OCCLUDED OM1 WITH UNSUCCESSFUL PCI ATTEMPT, O/W MILD TO MODERATE DISEASE, DR ANDY CHIU ([facility])    . LEFT HEART CATH AND CORONARY ANGIOGRAPHY  09/2016   Acute Inferior STEMI ([facility])  - Mid LAD 30%, D2 50%.  OM 1/RI 100%, mid circumflex 30%.  Acute mRCA 100% (thrombotic) - BMS PCI.  EF 50% with  inferobasal hypokinesis.  .Marland KitchenLEFT HEART CATH AND CORONARY ANGIOGRAPHY  03/2017   (Dr. RChuck Hint- [facility]): OM(RI) ~ CTO (unfixable PCI unable to cross with wire/balloon. pLAD 40%, ost D1 40%;  prox-mid RCA 15%.  Mild stenosis with small mid LCx.  EF 60%.  .Marland KitchenLEFT HEART CATH AND CORONARY ANGIOGRAPHY N/A 05/01/2019   Procedure: LEFT HEART CATH AND CORONARY ANGIOGRAPHY;  Surgeon: VJettie Booze MD;  [place] [facility];  Service: Cardiovascular;  Laterality: N/A;  . LUMBAR DECOMPRESSION FORAMINOTOMY L3 -- L5/  REMOVAL CYST L4-5 FACET JOINT  08-21-2009   ALSO HAD PRIOR BACK SURG IN 1992  . LUMBAR LAMINECTOMY  04/04/2017   L2-3  . LUMBAR LAMINECTOMY/DECOMPRESSION MICRODISCECTOMY N/A 04/04/2017   Procedure: Laminectomy and Foraminotomy - Lumbar Two-Three;  Surgeon: JEustace Moore MD;  [place]  Service: Neurosurgery;  Laterality: N/A;  . OPEN TENOTOMY OF FLEXOR 2ND AND 3RD RIGHT TOES  APRIL 2008  . PROSTATE BIOPSY    .  TRANSTHORACIC ECHOCARDIOGRAM  07/17/2011   at [facility], MILD TO MODERATE CONCENTRIC LVH/ NORMAL LVSF/ EF 55-60%/ MILD AORTIC STENOSIS  . TRANSTHORACIC ECHOCARDIOGRAM  06/2018   ([place]): Normal LV function/thickness.  EF 50 to 55%.  Normal wall motion.  Mild to moderate MAC.  Moderate to Severe Aortic Stenosis (calcified).  Mild aortic insufficiency.  . TRANSURETHRAL RESECTION OF BLADDER TUMOR  01-10-2004  . TRANSURETHRAL RESECTION OF PROSTATE N/A 05/07/2013   Procedure: TRANSURETHRAL RESECTION OF THE PROSTATE WITH GYRUS INSTRUMENTS;  Surgeon: Franchot Gallo, MD;  [facility];  Service: Urology;  Laterality: N/A;     Medications Prior to Admission: Prior to Admission medications   Medication Sig Start Date End Date Taking? Authorizing Provider  acetaminophen (TYLENOL) 500 MG tablet Take 500 mg by mouth every 6 (six) hours as needed (pain.).    [provider]  ALPRAZolam Duanne Moron) 1 MG tablet Take 0.5 mg by mouth at bedtime.  02/28/19   [provider]  Bempedoic Acid (NEXLETOL) 180 MG TABS Take 180 mg by mouth daily. 05/27/20   Minus Breeding, MD  buPROPion (WELLBUTRIN XL) 300 MG 24 hr tablet Take 300 mg by mouth daily.    [provider]  carvedilol (COREG) 6.25 MG tablet Take 1 tablet (6.25 mg total) by mouth 2 (two) times daily with a meal. 03/04/20   Minus Breeding, MD  cefUROXime (CEFTIN) 500 MG tablet Take 500 mg by mouth 2 (two) times daily. 04/05/20   [provider]  clopidogrel (PLAVIX) 75 MG tablet TAKE 1 TABLET BY MOUTH EVERY DAY 10/19/19   Minus Breeding, MD  doxycycline (ADOXA) 100 MG tablet     [provider]  Evolocumab (REPATHA SURECLICK) 716 MG/ML SOAJ Inject 140 mg into the skin every 14 (fourteen) days. 11/23/19   Minus Breeding, MD  famotidine (PEPCID) 20 MG tablet Take 20 mg by mouth 2 (two) times daily as needed (acid reflux/indigestion.).  09/10/18   [provider]  HYDROcodone-acetaminophen (NORCO/VICODIN) 5-325 MG tablet Take 1 tablet by mouth 3 (three) times daily as needed. Patient not taking: Reported on 07/26/2020 10/13/19   [provider]  isosorbide mononitrate (IMDUR) 60 MG 24 hr tablet TAKE 1 TABLET BY MOUTH ONCE (1) DAILY 07/15/20   Minus Breeding, MD  losartan (COZAAR) 25 MG tablet Take 1 tablet (25 mg total) by mouth daily. 04/05/20   Minus Breeding, MD  meloxicam (MOBIC) 7.5 MG tablet Take 1 tablet (7.5 mg total) by mouth daily as needed for pain. 05/02/20   Minus Breeding, MD  Multiple Vitamin (MULTIVITAMIN WITH MINERALS) TABS tablet Take 1 tablet by mouth daily.    [provider]  nitroGLYCERIN (NITROSTAT) 0.4 MG SL tablet Place 1 tablet (0.4 mg total) under the tongue every 5 (five) minutes as needed for chest pain. 04/20/20   Minus Breeding, MD  Polyethyl Glycol-Propyl Glycol (LUBRICANT EYE DROPS) 0.4-0.3 % SOLN Place 1 drop into both eyes 3 (three) times daily as needed (dry/irritated eyes).    [provider]   testosterone cypionate (DEPOTESTOSTERONE CYPIONATE) 200 MG/ML injection Inject 120 mg into the muscle once a week. 03/27/19   [provider]  traMADol (ULTRAM) 50 MG tablet Take 50 mg by mouth every 6 (six) hours as needed (severe pain.).     [provider]  valACYclovir (VALTREX) 1000 MG tablet Take 1,000 mg by mouth 3 (three) times daily as needed (fever blisters).  03/03/17   [provider]     Allergies:  Allergies  Allergen Reactions  . Statins     MD ORDERS UNSPECIFIED REACTION    . Augmentin [Amoxicillin-Pot Clavulanate] Nausea And Vomiting     Has patient had a PCN reaction causing immediate rash, facial/tongue/throat swelling, SOB or lightheadedness with hypotension: No Has patient had a PCN reaction causing severe rash involving mucus membranes or skin necrosis: No Has patient had a PCN reaction that required hospitalization No Has patient had a PCN reaction occurring within the last 10 years: No If all of the above answers are "NO", then may proceed with Cephalosporin use.   Marland Kitchen Dexamethasone Other (See Comments)    Frequent Urination [? HYPERGLYCEMIA? ]  . Sulfa Antibiotics Nausea And Vomiting    Social History:   Social History   Socioeconomic History  . Marital status: Married    Spouse name: Not on file  . Number of children: Not on file  . Years of education: Not on file  . Highest education level: Not on file  Occupational History  . Occupation: Retired  Tobacco Use  . Smoking status: Former Smoker    Packs/day: 0.50    Years: 50.00    Pack years: 25.00    Types: Cigarettes    Quit date: 04/15/2010    Years since quitting: 10.3  . Smokeless tobacco: Current User    Types: Snuff  . Tobacco comment: occasional  snuff pack  Vaping Use  . Vaping Use: Never used  Substance and Sexual Activity  . Alcohol use: Yes    Alcohol/week: 2.0 standard drinks    Types: 2 Cans of beer per week    Comment: social  . Drug use: No  .  Sexual activity: Yes  Other Topics Concern  . Not on file  Social History Narrative   he prefers Dr. Percival Spanish to going back to the cardiologist at [facility] since Dr. Wyline Copas is no longer there.   Social Determinants of Health   Financial Resource Strain:   . Difficulty of Paying Living Expenses: Not on file  Food Insecurity:   . Worried About Charity fundraiser in the Last Year: Not on file  . Ran Out of Food in the Last Year: Not on file  Transportation Needs:   . Lack of Transportation (Medical): Not on file  . Lack of Transportation (Non-Medical): Not on file  Physical Activity:   . Days of Exercise per Week: Not on file  . Minutes of Exercise per Session: Not on file  Stress:   . Feeling of Stress : Not on file  Social Connections:   . Frequency of Communication with Friends and Family: Not on file  . Frequency of Social Gatherings with Friends and Family: Not on file  . Attends Religious Services: Not on file  . Active Member of Clubs or Organizations: Not on file  . Attends Archivist Meetings: Not on file  . Marital Status: Not on file  Intimate Partner Violence:   . Fear of Current or Ex-Partner: Not on file  . Emotionally Abused: Not on file  . Physically Abused: Not on file  . Sexually Abused: Not on file    Family History:   The patient's family history includes Cancer in his father and mother; Heart failure in his mother.    ROS:  Please see the history of present illness.  All other ROS reviewed and negative.     Physical Exam/Data:   Vitals:   08/22/20 0615 08/22/20 0630 08/22/20 0645 08/22/20  0700  BP: 139/79 137/80 112/62 125/76  Pulse: 64 63 63 60  Resp: (!) 8 (!) 22 17 (!) 9  Temp:      TempSrc:      SpO2: 93% 96% 96% 95%  Weight:      Height:       No intake or output data in the 24 hours ending 08/22/20 0832 Last 3 Weights 08/22/2020 07/26/2020 05/02/2020  Weight (lbs) 230 lb 229 lb 232 lb  Weight (kg) 104.327 kg 103.874 kg 105.235  kg     Body mass index is 28 kg/m.  General:  Well nourished, well developed, in no acute distress HEENT: sclera anicteric Lymph: no adenopathy Neck: no JVD Endocrine:  No thryomegaly Vascular: No carotid bruits; distal pulses 2+ bilaterally  Cardiac:  normal S1, S2; RRR; +murmur, no rubs or gallops  Lungs:  clear to auscultation bilaterally, no wheezing, rhonchi or rales  Abd: soft, nontender, no hepatomegaly  Ext: no edema Musculoskeletal:  No deformities, BUE and BLE strength normal and equal Skin: warm and dry  Neuro:  CNs 2-12 intact, no focal abnormalities noted Psych:  Normal affect    EKG:  sinus rhythm, rate 64 bpm, chronic RBBB, new inferolateral TWI, submm STE in aVR, V1-3  Relevant CV Studies: Echocardiogram 05/2020: 1. Severe aortic stenosis, moderate aortic regurgitation.  2. Left ventricular ejection fraction, by estimation, is 55 to 60%. The  left ventricle has normal function. The left ventricle has no regional  wall motion abnormalities. There is moderate concentric left ventricular  hypertrophy. Left ventricular  diastolic parameters are consistent with Grade I diastolic dysfunction  (impaired relaxation). Elevated left atrial pressure.  3. Right ventricular systolic function is normal. The right ventricular  size is normal. There is normal pulmonary artery systolic pressure.  4. Left atrial size was moderately dilated.  5. The mitral valve is normal in structure. Trivial mitral valve  regurgitation. No evidence of mitral stenosis.  6. The aortic valve is normal in structure. Aortic valve regurgitation is  moderate. Severe aortic valve stenosis. Aortic valve mean gradient  measures 46.0 mmHg.  7. The inferior vena cava is normal in size with greater than 50%  respiratory variability, suggesting right atrial pressure of 3 mmHg.   ETT 05/2020:  There was no ST segment deviation noted during stress.  No T wave inversion was noted during stress.   1.  Non-interpretable ECG for the assessment of ischemia (RBBB/inferior Q waves).  2. Average functional capacity (6:00 min:s; 7 METS).  3. Normal heart rate/blood pressure response to exercise.   Left heart catheterization 04/2019:  1st Mrg lesion is 99% stenosed. Essentially a CTO.  Mid LAD lesion is 50% stenosed.  2nd Diag lesion is 95% stenosed.  Scoring balloon angioplasty was performed using a BALLOON WOLVERINE 2.50X10.  Mid RCA lesion is 25% stenosed.  Mid Cx lesion is 25% stenosed.  Prox LAD to Mid LAD lesion is 25% stenosed.  LV end diastolic pressure is mildly elevated.  The left ventricular systolic function is normal.  The left ventricular ejection fraction is 55-65% by visual estimate.  There is moderate aortic valve stenosis.  Post intervention, there is a 25% residual stenosis.   DAPT for 30 days at a minimum.  Consider clopidogrel monotherapy given his diffuse disease.    Moderate aortic valve gradient.  Would get outpatient echocardiogram to further evaluate aortic valve stenosis.     Laboratory Data:  High Sensitivity Troponin:   Recent Labs  Lab 08/22/20  0542  TROPONINIHS 247*      Chemistry Recent Labs  Lab 08/22/20 0542 08/22/20 0619  NA 136 136  K 4.1 4.1  CL 102 101  CO2 24  --   GLUCOSE 103* 100*  BUN 19 24*  CREATININE 1.51* 1.40*  CALCIUM 9.2  --   GFRNONAA 43*  --   ANIONGAP 10  --     Recent Labs  Lab 08/22/20 0542  PROT 5.5*  ALBUMIN 3.6  AST 30  ALT 24  ALKPHOS 35*  BILITOT 0.9   Hematology Recent Labs  Lab 08/22/20 0542 08/22/20 0619  WBC 6.5  --   RBC 5.50  --   HGB 17.9* 18.0*  HCT 53.4* 53.0*  MCV 97.1  --   MCH 32.5  --   MCHC 33.5  --   RDW 12.5  --   PLT 187  --    BNPNo results for input(s): BNP, PROBNP in the last 168 hours.  DDimer No results for input(s): DDIMER in the last 168 hours.   Radiology/Studies:  DG Chest Portable 1 View  Result Date: 08/22/2020 CLINICAL DATA:  Midsternal chest  pain. EXAM: PORTABLE CHEST 1 VIEW COMPARISON:  04/05/2020 FINDINGS: 0549 hours. The lungs are clear without focal pneumonia, edema, pneumothorax or pleural effusion. Interstitial markings are diffusely coarsened with chronic features. Cardiopericardial silhouette is at upper limits of normal for size. The visualized bony structures of the thorax show no acute abnormality. Telemetry leads overlie the chest. IMPRESSION: No active disease. Electronically Signed   By: Misty Stanley M.D.   On: 08/22/2020 06:15     Assessment and Plan:   1. Unstable angina in patient with known CAD: patient presented with chest pain. EKG with new inferolateral TWI. HsTrop 247, no trend yet. Chest pain resolved with SL nitro. He was started on a heparin gtt and cardiology asked to evaluate.  Burtis Junes R/LHC today to further evaluate chest pain. The patient understands that risks included but are not limited to stroke (1 in 1000), death (1 in 65), kidney failure [usually temporary] (1 in 500), bleeding (1 in 200), allergic reaction [possibly serious] (1 in 200).  The patient understands and agrees to proceed.  - Continue heparin gtt for now - Will hydrate given mildly increased Cr from 1 year ago.  - Low threshold for nitro gtt if chest pain reoccurs - Will start aspirin 81 mg  - Will hold plavix today pending heart catheterization results - Continue carvedilol and imdur  2. Severe AS: noted on last echo 05/2020 with plans for surveillance echo's outpatient q13mogiven lack of symptoms. Now with recurrent chest pain.  - Will assess on RHC today  3. HTN: BP stable this morning - Will hold home losartan given plans for heart cath today and mildly elevated Cr - Continue home carvedilol and imdur  4. HLD: LDL 68 this morning. Intolerant to statins.  - Continue nexletol and repatha  5. OSA: on CPAP at home - Continue CPAP this admission      TIMI Risk Score for Unstable Angina or Non-ST Elevation MI:   The patient's  TIMI risk score is 5, which indicates a 26% risk of all cause mortality, new or recurrent myocardial infarction or need for urgent revascularization in the next 14 days.      Severity of Illness: The appropriate patient status for this patient is INPATIENT. Inpatient status is judged to be reasonable and necessary in order to provide the required intensity of  service to ensure the patient's safety. The patient's presenting symptoms, physical exam findings, and initial radiographic and laboratory data in the context of their chronic comorbidities is felt to place them at high risk for further clinical deterioration. Furthermore, it is not anticipated that the patient will be medically stable for discharge from the hospital within 2 midnights of admission. The following factors support the patient status of inpatient.   " The patient's presenting symptoms include chest pain. " The worrisome physical exam findings include murmur " The initial radiographic and laboratory data are worrisome because of  EKG with new TWI and submm STE, elevated HsTrop. " The chronic co-morbidities include CAD, severe AS, HTN, HLD, OSA.   * I certify that at the point of admission it is my clinical judgment that the patient will require inpatient hospital care spanning beyond 2 midnights from the point of admission due to high intensity of service, high risk for further deterioration and high frequency of surveillance required.*    For questions or updates, please contact [facility] Please consult [email or web address] for contact info under     Signed, Abigail Butts, PA-C  08/22/2020 8:32 AM   I have personally seen and examined this patient. I agree with the assessment and plan as outlined above.  Anthony Hartman is known to have multi-vessel CAD with prior stenting of the RCA, known CTO of the OM and most recent PCI of the Diagonal branch in 2020. He is known to have severe aortic stenosis by most recent echo in July  2021. Mean gradient 46 mmHg, AVA 0.70 cm2, DI 0.20.  Now admitted with chest pain. Troponin is elevated.  EKG reviewed by me and shows sinus with RBBB, subtle precordial ST elevation with lateral ST depression.  Echo images reviewed by me. Severe AS My exam:  General: Well developed, well nourished, NAD  HEENT: OP clear, mucus membranes moist  SKIN: warm, dry. No rashes. Neuro: No focal deficits  Musculoskeletal: Muscle strength 5/5 all ext  Psychiatric: Mood and affect normal  Neck: No JVD, no carotid bruits, no thyromegaly, no lymphadenopathy.  Lungs:Clear bilaterally, no wheezes, rhonci, crackles Cardiovascular: Regular rate and rhythm. Systolic murmur.  Abdomen:Soft. Bowel sounds present. Non-tender.  Extremities: No lower extremity edema. Pulses are 2 + in the bilateral DP/PT.  Plan: Pt admitted with chest pain and elevated troponin in the setting of known CAD and severe AS. Will proceed with right and left heart cath today. Based on findings, will consider need for AVR with surgical AVR/bypass or TAVR if bypass is not indicated. Continue IV heparin while awaiting cath. He is now pain free. I have reviewed TAVR and the workup with the patient and his wife. Echo pending this am.   Lauree Chandler 08/22/2020 9:36 AM

## 2020-08-22 NOTE — ED Triage Notes (Signed)
Pt arrives via [facility], 0530 started having  mid sternal CP, non raidating. Yesterday having LUQ abdominal pain yesterday evening. Hx of MI. Pt given 324 ASA, 2 SL nitro (last 10 minutes ago). IV established x 2. 144/68, hr 64, o2 94-95% RA, RR 14.

## 2020-08-22 NOTE — Progress Notes (Signed)
ANTICOAGULATION CONSULT NOTE - Initial Consult  Pharmacy Consult for Heparin Indication: chest pain/ACS  Allergies  Allergen Reactions  . Statins     MD ORDERS UNSPECIFIED REACTION    . Augmentin [Amoxicillin-Pot Clavulanate] Nausea And Vomiting     Has patient had a PCN reaction causing immediate rash, facial/tongue/throat swelling, SOB or lightheadedness with hypotension: No Has patient had a PCN reaction causing severe rash involving mucus membranes or skin necrosis: No Has patient had a PCN reaction that required hospitalization No Has patient had a PCN reaction occurring within the last 10 years: No If all of the above answers are "NO", then may proceed with Cephalosporin use.   Marland Kitchen Dexamethasone Other (See Comments)    Frequent Urination [? HYPERGLYCEMIA? ]  . Sulfa Antibiotics Nausea And Vomiting    Patient Measurements: Height: 6\' 4"  (193 cm) Weight: 104.3 kg (230 lb) IBW/kg (Calculated) : 86.8 Heparin Dosing Weight: 100 kg  Vital Signs: Temp: 97.2 F (36.2 C) (10/11 0548) Temp Source: Temporal (10/11 0548) BP: 152/93 (10/11 0538) Pulse Rate: 66 (10/11 0538)  Labs: No results for input(s): HGB, HCT, PLT, APTT, LABPROT, INR, HEPARINUNFRC, HEPRLOWMOCWT, CREATININE, CKTOTAL, CKMB, TROPONINIHS in the last 72 hours.  CrCl cannot be calculated (Patient's most recent lab result is older than the maximum 21 days allowed.).   Medical History: Past Medical History:  Diagnosis Date  . Anxiety   . Arthritis   . Bladder cancer ([place])   . Bladder stone   . BPH (benign prostatic hypertrophy)   . Depression   . GERD (gastroesophageal reflux disease)   . History of bladder cancer    CARCINOMA IN SITU OF BLADDER  . History of kidney stones   . History of myocardial infarction    07-17-2011--   MI W/ OCCLUDED OM1. 2017 occluded RCA treated with a bare-metal stent, high-grade ramus intermediate stenosis in 2018 with failed attempted PCI  . Hyperlipidemia   . Hypertension    . Myocardial infarction [facility]) 2012; 2017   2012 - noted Subtotal CTO of OM/RI;  09/2016 - Acute Infer STEMI - BMS x 2 mRCA (3.5 mm x 15 & 3.5 x 12 - tapered 4.0 - 3.8 mm)  . OSA (obstructive sleep apnea)    cpap non-compliant   . Prostate cancer ([place])     Medications:  No current facility-administered medications on file prior to encounter.   Current Outpatient Medications on File Prior to Encounter  Medication Sig Dispense Refill  . acetaminophen (TYLENOL) 500 MG tablet Take 500 mg by mouth every 6 (six) hours as needed (pain.).    Marland Kitchen ALPRAZolam (XANAX) 1 MG tablet Take 0.5 mg by mouth at bedtime.    . Bempedoic Acid (NEXLETOL) 180 MG TABS Take 180 mg by mouth daily. 90 tablet 1  . buPROPion (WELLBUTRIN XL) 300 MG 24 hr tablet Take 300 mg by mouth daily.    . carvedilol (COREG) 6.25 MG tablet Take 1 tablet (6.25 mg total) by mouth 2 (two) times daily with a meal. 180 tablet 3  . cefUROXime (CEFTIN) 500 MG tablet Take 500 mg by mouth 2 (two) times daily.    . clopidogrel (PLAVIX) 75 MG tablet TAKE 1 TABLET BY MOUTH EVERY DAY 90 tablet 2  . doxycycline (ADOXA) 100 MG tablet     . Evolocumab (REPATHA SURECLICK) 294 MG/ML SOAJ Inject 140 mg into the skin every 14 (fourteen) days. 2 pen 11  . famotidine (PEPCID) 20 MG tablet Take 20 mg by mouth 2 (  two) times daily as needed (acid reflux/indigestion.).     Marland Kitchen HYDROcodone-acetaminophen (NORCO/VICODIN) 5-325 MG tablet Take 1 tablet by mouth 3 (three) times daily as needed. (Patient not taking: Reported on 07/26/2020)    . isosorbide mononitrate (IMDUR) 60 MG 24 hr tablet TAKE 1 TABLET BY MOUTH ONCE (1) DAILY 90 tablet 2  . losartan (COZAAR) 25 MG tablet Take 1 tablet (25 mg total) by mouth daily. 90 tablet 2  . meloxicam (MOBIC) 7.5 MG tablet Take 1 tablet (7.5 mg total) by mouth daily as needed for pain. 30 tablet 3  . Multiple Vitamin (MULTIVITAMIN WITH MINERALS) TABS tablet Take 1 tablet by mouth daily.    . nitroGLYCERIN (NITROSTAT) 0.4 MG SL  tablet Place 1 tablet (0.4 mg total) under the tongue every 5 (five) minutes as needed for chest pain. 25 tablet 6  . Polyethyl Glycol-Propyl Glycol (LUBRICANT EYE DROPS) 0.4-0.3 % SOLN Place 1 drop into both eyes 3 (three) times daily as needed (dry/irritated eyes).    Marland Kitchen testosterone cypionate (DEPOTESTOSTERONE CYPIONATE) 200 MG/ML injection Inject 120 mg into the muscle once a week.    . traMADol (ULTRAM) 50 MG tablet Take 50 mg by mouth every 6 (six) hours as needed (severe pain.).     Marland Kitchen valACYclovir (VALTREX) 1000 MG tablet Take 1,000 mg by mouth 3 (three) times daily as needed (fever blisters).        Assessment: 79 y.o. male with chest pain for heparin.  Goal of Therapy:  Heparin level 0.3-0.7 units/ml Monitor platelets by anticoagulation protocol: Yes   Plan:  Heparin 4000 units IV bolus, then start heparin 1250 units/hr Check heparin level in 6 hours.   Delane Stalling, [place] Curb 08/22/2020,5:58 AM

## 2020-08-22 NOTE — Progress Notes (Signed)
[place] for Heparin Indication: chest pain/ACS  Allergies  Allergen Reactions  . Statins     MD ORDERS UNSPECIFIED REACTION    . Augmentin [Amoxicillin-Pot Clavulanate] Nausea And Vomiting     Has patient had a PCN reaction causing immediate rash, facial/tongue/throat swelling, SOB or lightheadedness with hypotension: No Has patient had a PCN reaction causing severe rash involving mucus membranes or skin necrosis: No Has patient had a PCN reaction that required hospitalization No Has patient had a PCN reaction occurring within the last 10 years: No If all of the above answers are "NO", then may proceed with Cephalosporin use.   Marland Kitchen Dexamethasone Other (See Comments)    Frequent Urination [? HYPERGLYCEMIA? ]  . Sulfa Antibiotics Nausea And Vomiting    Patient Measurements: Height: 6\' 4"  (193 cm) Weight: 104.3 kg (230 lb) IBW/kg (Calculated) : 86.8 Heparin Dosing Weight: 100 kg  Vital Signs: BP: 141/66 (10/11 2230) Pulse Rate: 66 (10/11 2230)  Labs: Recent Labs    08/22/20 0542 08/22/20 0619 08/22/20 0921 08/22/20 1233 08/22/20 1542 08/22/20 2209  HGB 17.9* 18.0*  --   --   --   --   HCT 53.4* 53.0*  --   --   --   --   PLT 187  --   --   --   --   --   APTT 31  --   --   --   --   --   LABPROT 13.3  --   --   --   --   --   INR 1.1  --   --   --   --   --   HEPARINUNFRC  --   --   --  0.20*  --  0.39  CREATININE 1.51* 1.40*  --   --   --   --   TROPONINIHS 247*  --  736*  --  1,625*  --     Estimated Creatinine Clearance: 56.8 mL/min (A) (by C-G formula based on SCr of 1.4 mg/dL (H)).   Medical History: Past Medical History:  Diagnosis Date  . Anxiety   . Arthritis   . Bladder cancer ([place])   . Bladder stone   . BPH (benign prostatic hypertrophy)   . Depression   . GERD (gastroesophageal reflux disease)   . History of bladder cancer    CARCINOMA IN SITU OF BLADDER  . History of kidney stones   . History of  myocardial infarction    07-17-2011--   MI W/ OCCLUDED OM1. 2017 occluded RCA treated with a bare-metal stent, high-grade ramus intermediate stenosis in 2018 with failed attempted PCI  . Hyperlipidemia   . Hypertension   . Myocardial infarction [facility]) 2012; 2017   2012 - noted Subtotal CTO of OM/RI;  09/2016 - Acute Infer STEMI - BMS x 2 mRCA (3.5 mm x 15 & 3.5 x 12 - tapered 4.0 - 3.8 mm)  . OSA (obstructive sleep apnea)    cpap non-compliant   . Prostate cancer ([place])     Medications:  No current facility-administered medications on file prior to encounter.   Current Outpatient Medications on File Prior to Encounter  Medication Sig Dispense Refill  . acetaminophen (TYLENOL) 500 MG tablet Take 500 mg by mouth every 6 (six) hours as needed (pain.).    Marland Kitchen ALPRAZolam (XANAX) 1 MG tablet Take 0.5 mg by mouth at bedtime.    . Bempedoic Acid (NEXLETOL) 180 MG  TABS Take 180 mg by mouth daily. 90 tablet 1  . buPROPion (WELLBUTRIN XL) 300 MG 24 hr tablet Take 300 mg by mouth daily.    . carvedilol (COREG) 6.25 MG tablet Take 1 tablet (6.25 mg total) by mouth 2 (two) times daily with a meal. 180 tablet 3  . clopidogrel (PLAVIX) 75 MG tablet TAKE 1 TABLET BY MOUTH EVERY DAY (Patient taking differently: Take 75 mg by mouth daily. ) 90 tablet 2  . Evolocumab (REPATHA SURECLICK) 161 MG/ML SOAJ Inject 140 mg into the skin every 14 (fourteen) days. 2 pen 11  . famotidine (PEPCID) 20 MG tablet Take 20 mg by mouth 2 (two) times daily as needed (acid reflux/indigestion.).     Marland Kitchen isosorbide mononitrate (IMDUR) 60 MG 24 hr tablet TAKE 1 TABLET BY MOUTH ONCE (1) DAILY (Patient taking differently: Take 60 mg by mouth daily. ) 90 tablet 2  . losartan (COZAAR) 25 MG tablet Take 1 tablet (25 mg total) by mouth daily. 90 tablet 2  . meloxicam (MOBIC) 7.5 MG tablet Take 1 tablet (7.5 mg total) by mouth daily as needed for pain. 30 tablet 3  . Multiple Vitamin (MULTIVITAMIN WITH MINERALS) TABS tablet Take 1 tablet by  mouth daily.    . nitroGLYCERIN (NITROSTAT) 0.4 MG SL tablet Place 1 tablet (0.4 mg total) under the tongue every 5 (five) minutes as needed for chest pain. 25 tablet 6  . Polyethyl Glycol-Propyl Glycol (LUBRICANT EYE DROPS) 0.4-0.3 % SOLN Place 1 drop into both eyes 3 (three) times daily as needed (dry/irritated eyes).    Marland Kitchen testosterone cypionate (DEPOTESTOSTERONE CYPIONATE) 200 MG/ML injection Inject 120 mg into the muscle every 14 (fourteen) days.     . valACYclovir (VALTREX) 1000 MG tablet Take 1,000 mg by mouth 3 (three) times daily as needed (fever blisters).     . cefUROXime (CEFTIN) 500 MG tablet Take 500 mg by mouth 2 (two) times daily. (Patient not taking: Reported on 08/22/2020)    . doxycycline (ADOXA) 100 MG tablet  (Patient not taking: Reported on 08/22/2020)    . HYDROcodone-acetaminophen (NORCO/VICODIN) 5-325 MG tablet Take 1 tablet by mouth 3 (three) times daily as needed. (Patient not taking: Reported on 07/26/2020)    . [DISCONTINUED] traMADol (ULTRAM) 50 MG tablet Take 50 mg by mouth every 6 (six) hours as needed (severe pain.).  (Patient not taking: Reported on 08/22/2020)       Assessment: 79 y.o. male with chest pain, hx of CAD.  No anticoagulation PTA.  Plan for heart cath today.    Heparin level at goal. No issues noted  Goal of Therapy:  Heparin level 0.3-0.7 units/ml Monitor platelets by anticoagulation protocol: Yes   Plan:  Continue heparin gtt at 1400 units/hr  Erin Hearing PharmD., BCPS Clinical Pharmacist 08/22/2020 10:50 PM

## 2020-08-22 NOTE — ED Triage Notes (Signed)
Pt says he is having chest pain, non radiating 2/10

## 2020-08-22 NOTE — Progress Notes (Signed)
[place] for Heparin Indication: chest pain/ACS  Allergies  Allergen Reactions  . Statins     MD ORDERS UNSPECIFIED REACTION    . Augmentin [Amoxicillin-Pot Clavulanate] Nausea And Vomiting     Has patient had a PCN reaction causing immediate rash, facial/tongue/throat swelling, SOB or lightheadedness with hypotension: No Has patient had a PCN reaction causing severe rash involving mucus membranes or skin necrosis: No Has patient had a PCN reaction that required hospitalization No Has patient had a PCN reaction occurring within the last 10 years: No If all of the above answers are "NO", then may proceed with Cephalosporin use.   Marland Kitchen Dexamethasone Other (See Comments)    Frequent Urination [? HYPERGLYCEMIA? ]  . Sulfa Antibiotics Nausea And Vomiting    Patient Measurements: Height: 6\' 4"  (193 cm) Weight: 104.3 kg (230 lb) IBW/kg (Calculated) : 86.8 Heparin Dosing Weight: 100 kg  Vital Signs: Temp: 97.2 F (36.2 C) (10/11 0548) Temp Source: Temporal (10/11 0548) BP: 119/79 (10/11 1315) Pulse Rate: 71 (10/11 1315)  Labs: Recent Labs    08/22/20 0542 08/22/20 0619 08/22/20 0921 08/22/20 1233  HGB 17.9* 18.0*  --   --   HCT 53.4* 53.0*  --   --   PLT 187  --   --   --   APTT 31  --   --   --   LABPROT 13.3  --   --   --   INR 1.1  --   --   --   HEPARINUNFRC  --   --   --  0.20*  CREATININE 1.51* 1.40*  --   --   TROPONINIHS 247*  --  736*  --     Estimated Creatinine Clearance: 56.8 mL/min (A) (by C-G formula based on SCr of 1.4 mg/dL (H)).   Medical History: Past Medical History:  Diagnosis Date  . Anxiety   . Arthritis   . Bladder cancer ([place])   . Bladder stone   . BPH (benign prostatic hypertrophy)   . Depression   . GERD (gastroesophageal reflux disease)   . History of bladder cancer    CARCINOMA IN SITU OF BLADDER  . History of kidney stones   . History of myocardial infarction    07-17-2011--   MI W/ OCCLUDED  OM1. 2017 occluded RCA treated with a bare-metal stent, high-grade ramus intermediate stenosis in 2018 with failed attempted PCI  . Hyperlipidemia   . Hypertension   . Myocardial infarction [facility]) 2012; 2017   2012 - noted Subtotal CTO of OM/RI;  09/2016 - Acute Infer STEMI - BMS x 2 mRCA (3.5 mm x 15 & 3.5 x 12 - tapered 4.0 - 3.8 mm)  . OSA (obstructive sleep apnea)    cpap non-compliant   . Prostate cancer ([place])     Medications:  No current facility-administered medications on file prior to encounter.   Current Outpatient Medications on File Prior to Encounter  Medication Sig Dispense Refill  . acetaminophen (TYLENOL) 500 MG tablet Take 500 mg by mouth every 6 (six) hours as needed (pain.).    Marland Kitchen ALPRAZolam (XANAX) 1 MG tablet Take 0.5 mg by mouth at bedtime.    . Bempedoic Acid (NEXLETOL) 180 MG TABS Take 180 mg by mouth daily. 90 tablet 1  . buPROPion (WELLBUTRIN XL) 300 MG 24 hr tablet Take 300 mg by mouth daily.    . carvedilol (COREG) 6.25 MG tablet Take 1 tablet (6.25 mg total) by  mouth 2 (two) times daily with a meal. 180 tablet 3  . clopidogrel (PLAVIX) 75 MG tablet TAKE 1 TABLET BY MOUTH EVERY DAY (Patient taking differently: Take 75 mg by mouth daily. ) 90 tablet 2  . Evolocumab (REPATHA SURECLICK) 938 MG/ML SOAJ Inject 140 mg into the skin every 14 (fourteen) days. 2 pen 11  . famotidine (PEPCID) 20 MG tablet Take 20 mg by mouth 2 (two) times daily as needed (acid reflux/indigestion.).     Marland Kitchen isosorbide mononitrate (IMDUR) 60 MG 24 hr tablet TAKE 1 TABLET BY MOUTH ONCE (1) DAILY (Patient taking differently: Take 60 mg by mouth daily. ) 90 tablet 2  . losartan (COZAAR) 25 MG tablet Take 1 tablet (25 mg total) by mouth daily. 90 tablet 2  . meloxicam (MOBIC) 7.5 MG tablet Take 1 tablet (7.5 mg total) by mouth daily as needed for pain. 30 tablet 3  . Multiple Vitamin (MULTIVITAMIN WITH MINERALS) TABS tablet Take 1 tablet by mouth daily.    . nitroGLYCERIN (NITROSTAT) 0.4 MG SL  tablet Place 1 tablet (0.4 mg total) under the tongue every 5 (five) minutes as needed for chest pain. 25 tablet 6  . Polyethyl Glycol-Propyl Glycol (LUBRICANT EYE DROPS) 0.4-0.3 % SOLN Place 1 drop into both eyes 3 (three) times daily as needed (dry/irritated eyes).    Marland Kitchen testosterone cypionate (DEPOTESTOSTERONE CYPIONATE) 200 MG/ML injection Inject 120 mg into the muscle every 14 (fourteen) days.     . valACYclovir (VALTREX) 1000 MG tablet Take 1,000 mg by mouth 3 (three) times daily as needed (fever blisters).     . cefUROXime (CEFTIN) 500 MG tablet Take 500 mg by mouth 2 (two) times daily. (Patient not taking: Reported on 08/22/2020)    . doxycycline (ADOXA) 100 MG tablet  (Patient not taking: Reported on 08/22/2020)    . HYDROcodone-acetaminophen (NORCO/VICODIN) 5-325 MG tablet Take 1 tablet by mouth 3 (three) times daily as needed. (Patient not taking: Reported on 07/26/2020)    . [DISCONTINUED] traMADol (ULTRAM) 50 MG tablet Take 50 mg by mouth every 6 (six) hours as needed (severe pain.).  (Patient not taking: Reported on 08/22/2020)       Assessment: 79 y.o. male with chest pain, hx of CAD.  No anticoagulation PTA.  Plan for heart cath today.    Initial heparin level subtherapeutic on 1250 units/hr  Goal of Therapy:  Heparin level 0.3-0.7 units/ml Monitor platelets by anticoagulation protocol: Yes   Plan:  Increase heparin gtt to 1400 units/hr F/u 8 hour heparin level vs. Post-cath plan  Bertis Ruddy, PharmD Clinical Pharmacist ED Pharmacist Phone # [phone] 08/22/2020 1:42 PM

## 2020-08-22 NOTE — H&P (Addendum)
Severe aortic stenosis, multivessel coronary disease with sparing of LAD. Presented with chest pain. Probably needs left and right heart cath with coronary angiography.

## 2020-08-22 NOTE — Progress Notes (Signed)
  Echocardiogram 2D Echocardiogram has been performed.  Anthony Hartman [name] 08/22/2020, 10:34 AM

## 2020-08-22 NOTE — ED Notes (Signed)
Infiltration of the Heparin Infusion noted by this RN. Pharmacy and this RN assessed this pts site. No new orders at this time besides routine Heparin Level Draw. RN will continue to monitor.

## 2020-08-22 NOTE — ED Notes (Signed)
Pt transitioned into Hospital Bed for Comfort

## 2020-08-23 ENCOUNTER — Encounter (HOSPITAL_COMMUNITY): Payer: Self-pay | Admitting: Interventional Cardiology

## 2020-08-23 ENCOUNTER — Encounter (HOSPITAL_COMMUNITY)
Admission: EM | Disposition: A | Discharge status: Home / Self Care | Payer: Self-pay | Source: Home / Self Care | Attending: Surgery

## 2020-08-23 DIAGNOSIS — I350 Nonrheumatic aortic (valve) stenosis: Secondary | ICD-10-CM

## 2020-08-23 DIAGNOSIS — I214 Non-ST elevation (NSTEMI) myocardial infarction: Secondary | ICD-10-CM | POA: Diagnosis not present

## 2020-08-23 DIAGNOSIS — I25110 Atherosclerotic heart disease of native coronary artery with unstable angina pectoris: Secondary | ICD-10-CM | POA: Diagnosis not present

## 2020-08-23 HISTORY — PX: INTRAVASCULAR PRESSURE WIRE/FFR STUDY: CATH118243

## 2020-08-23 HISTORY — PX: RIGHT/LEFT HEART CATH AND CORONARY ANGIOGRAPHY: CATH118266

## 2020-08-23 LAB — POCT I-STAT EG7
Acid-Base Excess: 3 mmol/L — ABNORMAL HIGH (ref 0.0–2.0)
Bicarbonate: 29.2 mmol/L — ABNORMAL HIGH (ref 20.0–28.0)
Calcium, Ion: 1.2 mmol/L (ref 1.15–1.40)
HCT: 51 % (ref 39.0–52.0)
Hemoglobin: 17.3 g/dL — ABNORMAL HIGH (ref 13.0–17.0)
O2 Saturation: 70 %
Potassium: 4.2 mmol/L (ref 3.5–5.1)
Sodium: 139 mmol/L (ref 135–145)
TCO2: 31 mmol/L (ref 22–32)
pCO2, Ven: 49.1 mmHg (ref 44.0–60.0)
pH, Ven: 7.382 (ref 7.250–7.430)
pO2, Ven: 38 mmHg (ref 32.0–45.0)

## 2020-08-23 LAB — POCT I-STAT 7, (LYTES, BLD GAS, ICA,H+H)
Acid-Base Excess: 0 mmol/L (ref 0.0–2.0)
Bicarbonate: 26.1 mmol/L (ref 20.0–28.0)
Calcium, Ion: 1.24 mmol/L (ref 1.15–1.40)
HCT: 51 % (ref 39.0–52.0)
Hemoglobin: 17.3 g/dL — ABNORMAL HIGH (ref 13.0–17.0)
O2 Saturation: 96 %
Potassium: 4.1 mmol/L (ref 3.5–5.1)
Sodium: 139 mmol/L (ref 135–145)
TCO2: 27 mmol/L (ref 22–32)
pCO2 arterial: 44.9 mmHg (ref 32.0–48.0)
pH, Arterial: 7.372 (ref 7.350–7.450)
pO2, Arterial: 88 mmHg (ref 83.0–108.0)

## 2020-08-23 LAB — POCT ACTIVATED CLOTTING TIME: Activated Clotting Time: 235 seconds

## 2020-08-23 LAB — BASIC METABOLIC PANEL
Anion gap: 10 (ref 5–15)
BUN: 14 mg/dL (ref 8–23)
CO2: 24 mmol/L (ref 22–32)
Calcium: 8.6 mg/dL — ABNORMAL LOW (ref 8.9–10.3)
Chloride: 103 mmol/L (ref 98–111)
Creatinine, Ser: 1.31 mg/dL — ABNORMAL HIGH (ref 0.61–1.24)
GFR, Estimated: 51 mL/min — ABNORMAL LOW (ref >60)
Glucose, Bld: 76 mg/dL (ref 70–99)
Potassium: 4 mmol/L (ref 3.5–5.1)
Sodium: 137 mmol/L (ref 135–145)

## 2020-08-23 LAB — CBC
HCT: 52 % (ref 39.0–52.0)
Hemoglobin: 17.3 g/dL — ABNORMAL HIGH (ref 13.0–17.0)
MCH: 32.7 pg (ref 26.0–34.0)
MCHC: 33.3 g/dL (ref 30.0–36.0)
MCV: 98.3 fL (ref 80.0–100.0)
Platelets: 186 10*3/uL (ref 150–400)
RBC: 5.29 MIL/uL (ref 4.22–5.81)
RDW: 12.6 % (ref 11.5–15.5)
WBC: 7.1 10*3/uL (ref 4.0–10.5)
nRBC: 0 % (ref 0.0–0.2)

## 2020-08-23 LAB — HEPARIN LEVEL (UNFRACTIONATED): Heparin Unfractionated: 0.39 IU/mL (ref 0.30–0.70)

## 2020-08-23 SURGERY — RIGHT/LEFT HEART CATH AND CORONARY ANGIOGRAPHY
Anesthesia: LOCAL

## 2020-08-23 MED ORDER — IOHEXOL 350 MG/ML SOLN
INTRAVENOUS | Status: DC | PRN
  Administered 2020-08-23: 110 mL

## 2020-08-23 MED ORDER — SODIUM CHLORIDE 0.9 % IV SOLN: 250.0000 mL | INTRAVENOUS | Status: DC | PRN

## 2020-08-23 MED ORDER — ORAL CARE MOUTH RINSE: 15.0000 mL | Freq: Two times a day (BID) | OROMUCOSAL | Status: DC

## 2020-08-23 MED ORDER — LIDOCAINE HCL (PF) 1 % IJ SOLN
INTRAMUSCULAR | Status: AC
  Filled 2020-08-23: qty 30

## 2020-08-23 MED ORDER — ASPIRIN 81 MG PO CHEW: 81.0000 mg | CHEWABLE_TABLET | ORAL | Status: DC

## 2020-08-23 MED ORDER — MIDAZOLAM HCL 2 MG/2ML IJ SOLN
INTRAMUSCULAR | Status: AC
  Filled 2020-08-23: qty 2

## 2020-08-23 MED ORDER — HEPARIN (PORCINE) 25000 UT/250ML-% IV SOLN
1500.0000 [IU]/h | INTRAVENOUS | Status: DC
  Administered 2020-08-23 – 2020-08-24 (×2): 1400 [IU]/h via INTRAVENOUS
  Administered 2020-08-25 (×2): 1500 [IU]/h via INTRAVENOUS
  Filled 2020-08-23 (×3): qty 250

## 2020-08-23 MED ORDER — SODIUM CHLORIDE 0.9 % IV SOLN: INTRAVENOUS | Status: DC

## 2020-08-23 MED ORDER — HEPARIN (PORCINE) IN NACL 1000-0.9 UT/500ML-% IV SOLN
INTRAVENOUS | Status: AC
  Filled 2020-08-23: qty 1000

## 2020-08-23 MED ORDER — INFLUENZA VAC A&B SA ADJ QUAD 0.5 ML IM PRSY
0.5000 mL | PREFILLED_SYRINGE | INTRAMUSCULAR | Status: AC
  Administered 2020-08-24: 0.5 mL via INTRAMUSCULAR
  Filled 2020-08-23: qty 0.5

## 2020-08-23 MED ORDER — ASPIRIN 81 MG PO CHEW
81.0000 mg | CHEWABLE_TABLET | ORAL | Status: AC
  Administered 2020-08-23: 81 mg via ORAL
  Filled 2020-08-23: qty 1

## 2020-08-23 MED ORDER — HEPARIN SODIUM (PORCINE) 1000 UNIT/ML IJ SOLN
INTRAMUSCULAR | Status: AC
  Filled 2020-08-23: qty 1

## 2020-08-23 MED ORDER — ONDANSETRON HCL 4 MG/2ML IJ SOLN: 4.0000 mg | Freq: Four times a day (QID) | INTRAMUSCULAR | Status: DC | PRN

## 2020-08-23 MED ORDER — LABETALOL HCL 5 MG/ML IV SOLN: 10.0000 mg | INTRAVENOUS | Status: DC | PRN

## 2020-08-23 MED ORDER — MIDAZOLAM HCL 2 MG/2ML IJ SOLN
INTRAMUSCULAR | Status: DC | PRN
  Administered 2020-08-23 (×2): 1 mg via INTRAVENOUS

## 2020-08-23 MED ORDER — SODIUM CHLORIDE 0.9% FLUSH: 3.0000 mL | INTRAVENOUS | Status: DC | PRN

## 2020-08-23 MED ORDER — LIDOCAINE HCL (PF) 1 % IJ SOLN
INTRAMUSCULAR | Status: DC | PRN
  Administered 2020-08-23 (×2): 2 mL

## 2020-08-23 MED ORDER — SODIUM CHLORIDE 0.9% FLUSH
3.0000 mL | Freq: Two times a day (BID) | INTRAVENOUS | Status: DC
  Administered 2020-08-24 – 2020-08-25 (×4): 3 mL via INTRAVENOUS

## 2020-08-23 MED ORDER — HEPARIN SODIUM (PORCINE) 1000 UNIT/ML IJ SOLN
INTRAMUSCULAR | Status: DC | PRN
  Administered 2020-08-23: 2500 [IU] via INTRAVENOUS
  Administered 2020-08-23: 3000 [IU] via INTRAVENOUS
  Administered 2020-08-23: 5000 [IU] via INTRAVENOUS

## 2020-08-23 MED ORDER — FENTANYL CITRATE (PF) 100 MCG/2ML IJ SOLN
INTRAMUSCULAR | Status: DC | PRN
Start: 2020-08-23 — End: 2020-08-23
  Administered 2020-08-23: 50 ug via INTRAVENOUS

## 2020-08-23 MED ORDER — HYDRALAZINE HCL 20 MG/ML IJ SOLN: 10.0000 mg | INTRAMUSCULAR | Status: DC | PRN

## 2020-08-23 MED ORDER — HEPARIN (PORCINE) IN NACL 1000-0.9 UT/500ML-% IV SOLN
INTRAVENOUS | Status: AC
  Filled 2020-08-23: qty 500

## 2020-08-23 MED ORDER — ACETAMINOPHEN 325 MG PO TABS: 650.0000 mg | ORAL_TABLET | ORAL | Status: DC | PRN

## 2020-08-23 MED ORDER — VERAPAMIL HCL 2.5 MG/ML IV SOLN
INTRAVENOUS | Status: AC
  Filled 2020-08-23: qty 2

## 2020-08-23 MED ORDER — VERAPAMIL HCL 2.5 MG/ML IV SOLN
INTRAVENOUS | Status: DC | PRN
  Administered 2020-08-23: 10 mL via INTRA_ARTERIAL

## 2020-08-23 MED ORDER — OXYCODONE HCL 5 MG PO TABS
5.0000 mg | ORAL_TABLET | ORAL | Status: DC | PRN
  Administered 2020-08-23 – 2020-08-25 (×4): 5 mg via ORAL
  Filled 2020-08-23 (×4): qty 1

## 2020-08-23 MED ORDER — MAGNESIUM HYDROXIDE 400 MG/5ML PO SUSP
15.0000 mL | Freq: Every day | ORAL | Status: DC | PRN
  Administered 2020-08-23: 15 mL via ORAL
  Filled 2020-08-23: qty 30

## 2020-08-23 MED ORDER — CHLORHEXIDINE GLUCONATE 0.12 % MT SOLN
15.0000 mL | Freq: Two times a day (BID) | OROMUCOSAL | Status: DC
  Administered 2020-08-23 – 2020-08-27 (×7): 15 mL via OROMUCOSAL
  Filled 2020-08-23 (×7): qty 15

## 2020-08-23 MED ORDER — HEPARIN (PORCINE) IN NACL 1000-0.9 UT/500ML-% IV SOLN
INTRAVENOUS | Status: DC | PRN
  Administered 2020-08-23 (×3): 500 mL

## 2020-08-23 MED ORDER — FENTANYL CITRATE (PF) 100 MCG/2ML IJ SOLN
INTRAMUSCULAR | Status: AC
  Filled 2020-08-23: qty 2

## 2020-08-23 SURGICAL SUPPLY — 16 items
CATH 5FR JL3.5 JR4 ANG PIG MP (CATHETERS) ×1 IMPLANT
CATH INFINITI 5FR AL1 (CATHETERS) ×1 IMPLANT
CATH LAUNCHER 5F EBU3.5 (CATHETERS) ×1 IMPLANT
CATH SWAN GANZ 7F STRAIGHT (CATHETERS) ×1 IMPLANT
DEVICE RAD COMP TR BAND LRG (VASCULAR PRODUCTS) ×1 IMPLANT
GLIDESHEATH SLEND A-KIT 6F 22G (SHEATH) ×1 IMPLANT
GLIDESHEATH SLENDER 7FR .021G (SHEATH) ×1 IMPLANT
GUIDEWIRE INQWIRE 1.5J.035X260 (WIRE) IMPLANT
GUIDEWIRE PRESSURE COMET II (WIRE) ×1 IMPLANT
INQWIRE 1.5J .035X260CM (WIRE) ×2
KIT ESSENTIALS PG (KITS) ×1 IMPLANT
KIT HEART LEFT (KITS) ×2 IMPLANT
PACK CARDIAC CATHETERIZATION (CUSTOM PROCEDURE TRAY) ×2 IMPLANT
TRANSDUCER W/STOPCOCK (MISCELLANEOUS) ×2 IMPLANT
TUBING CIL FLEX 10 FLL-RA (TUBING) ×2 IMPLANT
WIRE EMERALD ST .035X260CM (WIRE) ×1 IMPLANT

## 2020-08-23 NOTE — Progress Notes (Signed)
Visit made to patients room to discuss CPAP.  Patient states he wears nasal mask at home.  I advised patient I would be back and set him up between 11-12

## 2020-08-23 NOTE — Progress Notes (Signed)
Pt arrived to unit.  Alert and oriented x4. Pt spouse at bedside. Pt denies chest pain at this time.  Pt was placed in telemetry monitor.CCMD called. Pt ordering lunch at this time. Call bell within reach.

## 2020-08-23 NOTE — Progress Notes (Signed)
Cardiology Afternoon Rounding Note:  Cardiac cath with severe mid LAD stenosis at the takeoff of the large Diagonal branch. The LAD lesion is flow limiting by pressure wire (DFR 0.84). The Diagonal ostial stenosis has recurred following balloon angioplasty in June 2020. He has had a functional occlusion of an obtuse marginal branch for years with prior failed PCI of this branch.  He is now having chest pain that may be related to his aortic stenosis as well as his obstructive CAD. Troponin is mildly elevated.  I have reviewed the cath films with the patient and his wife and illustrated his blockages.  He will need surgical AVR with bypass vs TAVR and high risk PCI. The intervention in the LAD and Diagonal would be difficult and there is a chance we would not have an optimal result with bifurcation stenting or primary stenting of the LAD with angioplasty alone of the Diagonal.  He seems to be a vibrant 79 yo man who is very active at baseline. Surgical AVR with bypass is an option. He is willing to consider either approach.    I have spoken to Nell Range, [place] who will see him in the am with one of the CT surgeons on our valve team.   Lauree Chandler 08/23/2020 2:30 PM

## 2020-08-23 NOTE — Progress Notes (Signed)
Pt's spouse requested to speak with chaplain.  Orders placed.

## 2020-08-23 NOTE — Progress Notes (Addendum)
Progress Note  Patient Name: Anthony Hartman Date of Encounter: 08/23/2020  [facility] Cardiologist: Minus Breeding, MD   Subjective   No chest pain this am  Inpatient Medications    Scheduled Meds:  ALPRAZolam  0.5 mg Oral QHS   aspirin EC  81 mg Oral Daily   buPROPion  300 mg Oral Daily   carvedilol  6.25 mg Oral BID WC   chlorhexidine  15 mL Mouth Rinse BID   [START ON 08/24/2020] influenza vaccine adjuvanted  0.5 mL Intramuscular Tomorrow-1000   isosorbide mononitrate  60 mg Oral Daily   mouth rinse  15 mL Mouth Rinse q12n4p   sodium chloride flush  3 mL Intravenous Q12H   sodium chloride flush  3 mL Intravenous Q12H   Continuous Infusions:  sodium chloride Stopped (08/22/20 1538)   sodium chloride     sodium chloride     sodium chloride 75 mL/hr at 08/23/20 0649   sodium chloride Stopped (08/23/20 0615)   heparin 1,400 Units/hr (08/23/20 0649)   PRN Meds: sodium chloride, sodium chloride, acetaminophen, famotidine, nitroGLYCERIN, ondansetron (ZOFRAN) IV, polyvinyl alcohol, sodium chloride flush, sodium chloride flush, traMADol   Vital Signs    Vitals:   08/23/20 0312 08/23/20 0358 08/23/20 0815 08/23/20 0911  BP: (!) 160/99 (!) 154/81 (!) 154/87 (!) 154/87  Pulse: 73 76 70 70  Resp: 20 18 18 18   Temp: 98 F (36.7 C)  98.2 F (36.8 C) 97.7 F (36.5 C)  TempSrc: Oral  Oral Oral  SpO2: 98%  96% 95%  Weight: 106.5 kg     Height: 6\' 4"  (1.93 m)       Intake/Output Summary (Last 24 hours) at 08/23/2020 1007 Last data filed at 08/23/2020 0649 Gross per 24 hour  Intake 470.46 ml  Output 650 ml  Net -179.54 ml   Last 3 Weights 08/23/2020 08/22/2020 07/26/2020  Weight (lbs) 234 lb 12.8 oz 230 lb 229 lb  Weight (kg) 106.505 kg 104.327 kg 103.874 kg      Telemetry    Sinus - Personally Reviewed  ECG    Sinus, lateral T wave inversion- Personally Reviewed  Physical Exam   GEN: No acute distress.   Neck: No JVD Cardiac: RRR,  Systolic murmur.   Respiratory: Clear to auscultation bilaterally. GI: Soft, nontender, non-distended  MS: No edema; No deformity. Neuro:  Nonfocal  Psych: Normal affect   Labs    High Sensitivity Troponin:   Recent Labs  Lab 08/22/20 0542 08/22/20 0921 08/22/20 1542  TROPONINIHS 247* 736* 1,625*      Chemistry Recent Labs  Lab 08/22/20 0542 08/22/20 0619 08/23/20 0242  NA 136 136 137  K 4.1 4.1 4.0  CL 102 101 103  CO2 24  --  24  GLUCOSE 103* 100* 76  BUN 19 24* 14  CREATININE 1.51* 1.40* 1.31*  CALCIUM 9.2  --  8.6*  PROT 5.5*  --   --   ALBUMIN 3.6  --   --   AST 30  --   --   ALT 24  --   --   ALKPHOS 35*  --   --   BILITOT 0.9  --   --   GFRNONAA 43*  --  51*  ANIONGAP 10  --  10     Hematology Recent Labs  Lab 08/22/20 0542 08/22/20 0619 08/23/20 0242  WBC 6.5  --  7.1  RBC 5.50  --  5.29  HGB 17.9* 18.0* 17.3*  HCT 53.4* 53.0* 52.0  MCV 97.1  --  98.3  MCH 32.5  --  32.7  MCHC 33.5  --  33.3  RDW 12.5  --  12.6  PLT 187  --  186    BNPNo results for input(s): BNP, PROBNP in the last 168 hours.   DDimer No results for input(s): DDIMER in the last 168 hours.   Radiology    DG Chest Portable 1 View  Result Date: 08/22/2020 CLINICAL DATA:  Midsternal chest pain. EXAM: PORTABLE CHEST 1 VIEW COMPARISON:  04/05/2020 FINDINGS: 0549 hours. The lungs are clear without focal pneumonia, edema, pneumothorax or pleural effusion. Interstitial markings are diffusely coarsened with chronic features. Cardiopericardial silhouette is at upper limits of normal for size. The visualized bony structures of the thorax show no acute abnormality. Telemetry leads overlie the chest. IMPRESSION: No active disease. Electronically Signed   By: Misty Stanley M.D.   On: 08/22/2020 06:15   ECHOCARDIOGRAM COMPLETE  Result Date: 08/22/2020    ECHOCARDIOGRAM REPORT   Patient Name:   Anthony Hartman Date of Exam: 08/22/2020 Medical Rec #:  [id number]      Height:       76.0 in  Accession #:    [id number]     Weight:       230.0 lb Date of Birth:  30-Jul-1941      BSA:          2.351 m Patient Age:    79 years       BP:           117/65 mmHg Patient Gender: M              HR:           62 bpm. Exam Location:  Inpatient Procedure: 2D Echo, Cardiac Doppler and Color Doppler Indications:    R07.9* Chest pain, unspecified  History:        Patient has prior history of Echocardiogram examinations, most                 recent 05/23/2020. Previous Myocardial Infarction; Risk                 Factors:Hypertension, Dyslipidemia and Sleep Apnea. Cancer.                 GERD.  Sonographer:    Jonelle Sidle Dance Referring Phys: [id number] Reading  1. Left ventricular ejection fraction, by estimation, is 50 to 55%. The left ventricle has low normal function. The left ventricle demonstrates regional wall motion abnormalities (see scoring diagram/findings for description). There is moderate concentric left ventricular hypertrophy. Left ventricular diastolic parameters are consistent with Grade I diastolic dysfunction (impaired relaxation). There is mild hypokinesis of the left ventricular, basal anterior wall, anterolateral wall and inferolateral wall.  2. Right ventricular systolic function is normal. The right ventricular size is normal.  3. Left atrial size was severely dilated.  4. Right atrial size was mild to moderately dilated.  5. The mitral valve is normal in structure. Trivial mitral valve regurgitation. No evidence of mitral stenosis. Moderate mitral annular calcification.  6. The aortic valve is calcified. There is severe calcifcation of the aortic valve. There is severe thickening of the aortic valve. Aortic valve regurgitation is moderate. Severe aortic valve stenosis. Aortic valve area, by VTI measures 0.63 cm. Aortic  valve mean gradient measures 42.0 mmHg. Aortic valve Vmax measures 4.17 m/s.  7. Pulmonic valve regurgitation is moderate.  8. The inferior vena cava is normal in  size with greater than 50% respiratory variability, suggesting right atrial pressure of 3 mmHg.  9. Possible left to right shunt cannot be excluded. Comparison(s): Changes from prior study are noted. Conclusion(s)/Recommendation(s): Severe AS with moderate AI. EF overal low normal, but on several views the basal anterior, anteriolateral, and inferolateral walls are mildly hypokinetic. FINDINGS  Left Ventricle: Left ventricular ejection fraction, by estimation, is 50 to 55%. The left ventricle has low normal function. The left ventricle demonstrates regional wall motion abnormalities. Mild hypokinesis of the left ventricular, basal anterior wall, anterolateral wall and inferolateral wall. The left ventricular internal cavity size was normal in size. There is moderate concentric left ventricular hypertrophy. Left ventricular diastolic parameters are consistent with Grade I diastolic dysfunction (impaired relaxation). Right Ventricle: The right ventricular size is normal. No increase in right ventricular wall thickness. Right ventricular systolic function is normal. Left Atrium: Left atrial size was severely dilated. Right Atrium: Right atrial size was mild to moderately dilated. Pericardium: There is no evidence of pericardial effusion. Presence of pericardial fat pad. Mitral Valve: The mitral valve is normal in structure. There is moderate thickening of the mitral valve leaflet(s). There is mild calcification of the mitral valve leaflet(s). Moderate mitral annular calcification. Trivial mitral valve regurgitation. No evidence of mitral valve stenosis. Tricuspid Valve: The tricuspid valve is normal in structure. Tricuspid valve regurgitation is trivial. No evidence of tricuspid stenosis. Aortic Valve: The aortic valve is calcified. There is severe calcifcation of the aortic valve. There is severe thickening of the aortic valve. There is moderate aortic valve annular calcification. Aortic valve regurgitation is  moderate. Aortic regurgitation PHT measures 365 msec. Severe aortic stenosis is present. Aortic valve mean gradient measures 42.0 mmHg. Aortic valve peak gradient measures 69.6 mmHg. Aortic valve area, by VTI measures 0.63 cm. Pulmonic Valve: The pulmonic valve was grossly normal. Pulmonic valve regurgitation is moderate. No evidence of pulmonic stenosis. Aorta: The aortic root and ascending aorta are structurally normal, with no evidence of dilitation. Venous: The inferior vena cava is normal in size with greater than 50% respiratory variability, suggesting right atrial pressure of 3 mmHg. IAS/Shunts: Possible left to right shunt cannot be excluded.  LEFT VENTRICLE PLAX 2D LVIDd:         4.95 cm  Diastology LVIDs:         4.27 cm  LV e' medial:    3.48 cm/s LV PW:         1.38 cm  LV E/e' medial:  17.7 LV IVS:        1.42 cm  LV e' lateral:   5.55 cm/s LVOT diam:     2.20 cm  LV E/e' lateral: 11.1 LV SV:         72 LV SV Index:   31 LVOT Area:     3.80 cm  RIGHT VENTRICLE            IVC RV Basal diam:  3.28 cm    IVC diam: 1.69 cm RV Mid diam:    2.06 cm RV S prime:     7.94 cm/s TAPSE (M-mode): 1.8 cm LEFT ATRIUM              Index       RIGHT ATRIUM           Index LA diam:        4.80 cm  2.04 cm/m  RA Area:  20.80 cm LA Vol (A2C):   105.0 ml 44.67 ml/m RA Volume:   67.90 ml  28.88 ml/m LA Vol (A4C):   172.0 ml 73.17 ml/m LA Biplane Vol: 148.0 ml 62.96 ml/m  AORTIC VALVE AV Area (Vmax):    0.65 cm AV Area (Vmean):   0.63 cm AV Area (VTI):     0.63 cm AV Vmax:           417.00 cm/s AV Vmean:          301.000 cm/s AV VTI:            1.140 m AV Peak Grad:      69.6 mmHg AV Mean Grad:      42.0 mmHg LVOT Vmax:         71.70 cm/s LVOT Vmean:        49.800 cm/s LVOT VTI:          0.189 m LVOT/AV VTI ratio: 0.17 AI PHT:            365 msec  AORTA Ao Root diam: 3.20 cm Ao Asc diam:  3.40 cm MITRAL VALVE MV Area (PHT): 2.62 cm    SHUNTS MV Decel Time: 289 msec    Systemic VTI:  0.19 m MV E velocity: 61.60  cm/s  Systemic Diam: 2.20 cm MV A velocity: 77.10 cm/s MV E/A ratio:  0.80 Buford Dresser MD Electronically signed by Buford Dresser MD Signature Date/Time: 08/22/2020/3:37:03 PM    Final     Cardiac Studies    Patient Profile     79 y.o. male with a PMH of CAD (first MI in 2012 with unsuccessful PCI attempt to OM1, s/p BMS to RCA in 2017, with subsequent PCI to 2nd diagonal 04/2019), severe AS, HTN, HLD, OSA, GERD, BPH, and bladder cancer, who presented with chest pain.   Assessment & Plan    1. CAD with unstable angina/NSTEMI 2. Severe aortic stenosis  Labs reviewed. Echo images from yesterday reviewed. His aortic stenosis is severe. He will likely need AVR and revascularization. Will proceed with right and left cardiac catheterization today. Will base decision for surgical AVR vs TAVR on the coronary artery findings.   For questions or updates, please contact [facility] Please consult [email or web address] for contact info under        Signed, Lauree Chandler, MD  08/23/2020, 10:07 AM

## 2020-08-23 NOTE — Progress Notes (Signed)
Placed patient on CPAP via FFM, auto titrate settings (max20, min 6 cmH2) 21%FIO2. Tolerating well at this time. RN aware.

## 2020-08-23 NOTE — Progress Notes (Signed)
°   08/23/20 1000  Clinical Encounter Type  Visited With Patient and family together  Visit Type Initial  Referral From Nurse  Consult/Referral To Chaplain  Spiritual Encounters  Spiritual Needs Literature  The patient wanted to speak about an AD. The patient's wife was present. The chaplain provided education surrounding Power of Attorney and Living Will. The patient and his wife will review the paperwork, complete, and the chaplain will come back with the notary and volunteers.

## 2020-08-23 NOTE — Progress Notes (Addendum)
[place] for Heparin Indication: chest pain/ACS  Allergies  Allergen Reactions  . Statins     MD ORDERS UNSPECIFIED REACTION    . Augmentin [Amoxicillin-Pot Clavulanate] Nausea And Vomiting     Has patient had a PCN reaction causing immediate rash, facial/tongue/throat swelling, SOB or lightheadedness with hypotension: No Has patient had a PCN reaction causing severe rash involving mucus membranes or skin necrosis: No Has patient had a PCN reaction that required hospitalization No Has patient had a PCN reaction occurring within the last 10 years: No If all of the above answers are "NO", then may proceed with Cephalosporin use.   Marland Kitchen Dexamethasone Other (See Comments)    Frequent Urination [? HYPERGLYCEMIA? ]  . Sulfa Antibiotics Nausea And Vomiting    Patient Measurements: Height: 6\' 4"  (193 cm) Weight: 106.5 kg (234 lb 12.8 oz) (scale A) IBW/kg (Calculated) : 86.8 Heparin Dosing Weight: 100 kg  Vital Signs: Temp: 98 F (36.7 C) (10/12 0312) Temp Source: Oral (10/12 0312) BP: 154/81 (10/12 0358) Pulse Rate: 76 (10/12 0358)  Labs: Recent Labs    08/22/20 0542 08/22/20 0542 08/22/20 7169 08/22/20 0921 08/22/20 1233 08/22/20 1542 08/22/20 2209 08/23/20 0242 08/23/20 0243  HGB 17.9*   < > 18.0*  --   --   --   --  17.3*  --   HCT 53.4*  --  53.0*  --   --   --   --  52.0  --   PLT 187  --   --   --   --   --   --  186  --   APTT 31  --   --   --   --   --   --   --   --   LABPROT 13.3  --   --   --   --   --   --   --   --   INR 1.1  --   --   --   --   --   --   --   --   HEPARINUNFRC  --   --   --   --  0.20*  --  0.39  --  0.39  CREATININE 1.51*  --  1.40*  --   --   --   --  1.31*  --   TROPONINIHS 247*  --   --  736*  --  1,625*  --   --   --    < > = values in this interval not displayed.    Estimated Creatinine Clearance: 61.2 mL/min (A) (by C-G formula based on SCr of 1.31 mg/dL (H)).   Medical History: Past  Medical History:  Diagnosis Date  . Anxiety   . Arthritis   . Bladder cancer ([place])   . Bladder stone   . BPH (benign prostatic hypertrophy)   . Depression   . GERD (gastroesophageal reflux disease)   . History of bladder cancer    CARCINOMA IN SITU OF BLADDER  . History of kidney stones   . History of myocardial infarction    07-17-2011--   MI W/ OCCLUDED OM1. 2017 occluded RCA treated with a bare-metal stent, high-grade ramus intermediate stenosis in 2018 with failed attempted PCI  . Hyperlipidemia   . Hypertension   . Myocardial infarction [facility]) 2012; 2017   2012 - noted Subtotal CTO of OM/RI;  09/2016 - Acute Infer STEMI - BMS x 2 mRCA (  3.5 mm x 15 & 3.5 x 12 - tapered 4.0 - 3.8 mm)  . OSA (obstructive sleep apnea)    cpap non-compliant   . Prostate cancer ([place])     Medications:  No current facility-administered medications on file prior to encounter.   Current Outpatient Medications on File Prior to Encounter  Medication Sig Dispense Refill  . acetaminophen (TYLENOL) 500 MG tablet Take 500 mg by mouth every 6 (six) hours as needed (pain.).    Marland Kitchen ALPRAZolam (XANAX) 1 MG tablet Take 0.5 mg by mouth at bedtime.    . Bempedoic Acid (NEXLETOL) 180 MG TABS Take 180 mg by mouth daily. 90 tablet 1  . buPROPion (WELLBUTRIN XL) 300 MG 24 hr tablet Take 300 mg by mouth daily.    . carvedilol (COREG) 6.25 MG tablet Take 1 tablet (6.25 mg total) by mouth 2 (two) times daily with a meal. 180 tablet 3  . clopidogrel (PLAVIX) 75 MG tablet TAKE 1 TABLET BY MOUTH EVERY DAY (Patient taking differently: Take 75 mg by mouth daily. ) 90 tablet 2  . Evolocumab (REPATHA SURECLICK) 937 MG/ML SOAJ Inject 140 mg into the skin every 14 (fourteen) days. 2 pen 11  . famotidine (PEPCID) 20 MG tablet Take 20 mg by mouth 2 (two) times daily as needed (acid reflux/indigestion.).     Marland Kitchen isosorbide mononitrate (IMDUR) 60 MG 24 hr tablet TAKE 1 TABLET BY MOUTH ONCE (1) DAILY (Patient taking differently: Take 60 mg  by mouth daily. ) 90 tablet 2  . losartan (COZAAR) 25 MG tablet Take 1 tablet (25 mg total) by mouth daily. 90 tablet 2  . meloxicam (MOBIC) 7.5 MG tablet Take 1 tablet (7.5 mg total) by mouth daily as needed for pain. 30 tablet 3  . Multiple Vitamin (MULTIVITAMIN WITH MINERALS) TABS tablet Take 1 tablet by mouth daily.    . nitroGLYCERIN (NITROSTAT) 0.4 MG SL tablet Place 1 tablet (0.4 mg total) under the tongue every 5 (five) minutes as needed for chest pain. 25 tablet 6  . Polyethyl Glycol-Propyl Glycol (LUBRICANT EYE DROPS) 0.4-0.3 % SOLN Place 1 drop into both eyes 3 (three) times daily as needed (dry/irritated eyes).    Marland Kitchen testosterone cypionate (DEPOTESTOSTERONE CYPIONATE) 200 MG/ML injection Inject 120 mg into the muscle every 14 (fourteen) days.     . valACYclovir (VALTREX) 1000 MG tablet Take 1,000 mg by mouth 3 (three) times daily as needed (fever blisters).     . cefUROXime (CEFTIN) 500 MG tablet Take 500 mg by mouth 2 (two) times daily. (Patient not taking: Reported on 08/22/2020)    . doxycycline (ADOXA) 100 MG tablet  (Patient not taking: Reported on 08/22/2020)    . HYDROcodone-acetaminophen (NORCO/VICODIN) 5-325 MG tablet Take 1 tablet by mouth 3 (three) times daily as needed. (Patient not taking: Reported on 07/26/2020)       Assessment: 7 yoM with hx CAD and severe AS admitted with CP and started on IV heparin. LHC planned for today, heparin level therapeutic, CBC stable  Goal of Therapy:  Heparin level 0.3-0.7 units/ml Monitor platelets by anticoagulation protocol: Yes   Plan:  -Continue heparin 1400 units/h -F/U post/cath   ADDENDUM: Pt s/p LHC with possible need for staged PCI to restart heparin in 8h.  Plan: -Restart heparin 1400 units/h no bolus at 2000 -Check heparin level with am labs    Arrie Senate, PharmD, BCPS Clinical Pharmacist [phone] Please check AMION for all [place] numbers 08/23/2020

## 2020-08-24 ENCOUNTER — Inpatient Hospital Stay (HOSPITAL_COMMUNITY): Payer: PPO

## 2020-08-24 ENCOUNTER — Other Ambulatory Visit: Payer: Self-pay | Admitting: *Deleted

## 2020-08-24 ENCOUNTER — Encounter (HOSPITAL_COMMUNITY): Payer: Self-pay | Admitting: Cardiovascular Disease

## 2020-08-24 DIAGNOSIS — I350 Nonrheumatic aortic (valve) stenosis: Secondary | ICD-10-CM

## 2020-08-24 DIAGNOSIS — Z01810 Encounter for preprocedural cardiovascular examination: Secondary | ICD-10-CM | POA: Diagnosis not present

## 2020-08-24 DIAGNOSIS — I200 Unstable angina: Secondary | ICD-10-CM | POA: Diagnosis not present

## 2020-08-24 DIAGNOSIS — I2510 Atherosclerotic heart disease of native coronary artery without angina pectoris: Secondary | ICD-10-CM

## 2020-08-24 LAB — CBC
HCT: 50.5 % (ref 39.0–52.0)
Hemoglobin: 16.8 g/dL (ref 13.0–17.0)
MCH: 32.4 pg (ref 26.0–34.0)
MCHC: 33.3 g/dL (ref 30.0–36.0)
MCV: 97.5 fL (ref 80.0–100.0)
Platelets: 169 10*3/uL (ref 150–400)
RBC: 5.18 MIL/uL (ref 4.22–5.81)
RDW: 12.7 % (ref 11.5–15.5)
WBC: 7 10*3/uL (ref 4.0–10.5)
nRBC: 0 % (ref 0.0–0.2)

## 2020-08-24 LAB — BASIC METABOLIC PANEL
Anion gap: 9 (ref 5–15)
BUN: 13 mg/dL (ref 8–23)
CO2: 23 mmol/L (ref 22–32)
Calcium: 8.5 mg/dL — ABNORMAL LOW (ref 8.9–10.3)
Chloride: 103 mmol/L (ref 98–111)
Creatinine, Ser: 1.35 mg/dL — ABNORMAL HIGH (ref 0.61–1.24)
GFR, Estimated: 50 mL/min — ABNORMAL LOW (ref >60)
Glucose, Bld: 93 mg/dL (ref 70–99)
Potassium: 4.1 mmol/L (ref 3.5–5.1)
Sodium: 135 mmol/L (ref 135–145)

## 2020-08-24 LAB — HEPARIN LEVEL (UNFRACTIONATED): Heparin Unfractionated: 0.28 IU/mL — ABNORMAL LOW (ref 0.30–0.70)

## 2020-08-24 MED ORDER — ENSURE ENLIVE PO LIQD
237.0000 mL | Freq: Two times a day (BID) | ORAL | Status: DC
  Administered 2020-08-25 (×2): 237 mL via ORAL

## 2020-08-24 MED ORDER — ADULT MULTIVITAMIN W/MINERALS CH
1.0000 | ORAL_TABLET | Freq: Every day | ORAL | Status: DC
  Administered 2020-08-24 – 2020-08-25 (×2): 1 via ORAL
  Filled 2020-08-24 (×2): qty 1

## 2020-08-24 NOTE — Progress Notes (Signed)
   08/23/20 2000  Clinical Encounter Type  Visited With Patient  Visit Type Follow-up  Referral From Patient  Consult/Referral To Chaplain  Spiritual Encounters  Spiritual Needs Emotional;Literature  The chaplain responded to the request from the patient. The patient went for a procedure today and found out more intense news concerning his health condition. The patient's wife had left because visiting hours were over. The patient is requesting his AD be completed. Additionally, the chaplain provided emotional support to the patient as he processed the news he received from the doctor. The chaplain and patient talked about his faith and strength physically and emotionally. The patient has a strong will to live and go through the necessary surgery. The chaplain will pass on the AD to the morning report and see if another chaplain can complete the AD.

## 2020-08-24 NOTE — Progress Notes (Signed)
CARDIAC REHAB PHASE I   Preop education completed with pt and wife. Pt given IS and able to demonstrate 2250. Pt educated on importance of IS use, walks, and sternal precautions. Pt given in-the-tube sheet along with cardiac surgery booklet. Also discussed importance of having help at home after surgery. Pt and wife deny questions or concerns at this time. Will continue to follow throughout hospital stay.  [id number] Rufina Falco, RN BSN 08/24/2020 2:34 PM

## 2020-08-24 NOTE — Progress Notes (Signed)
Pre cabg has been completed.   Preliminary results in CV Proc.   Anthony Hartman 08/24/2020 11:10 AM

## 2020-08-24 NOTE — Progress Notes (Signed)
Initial Nutrition Assessment  DOCUMENTATION CODES:   Not applicable  INTERVENTION:   -Ensure Enlive po BID, each supplement provides 350 kcal and 20 grams of protein -MVI with minerals daily  NUTRITION DIAGNOSIS:   Increased nutrient needs related to post-op healing as evidenced by estimated needs.  GOAL:   Patient will meet greater than or equal to 90% of their needs  MONITOR:   PO intake, Supplement acceptance, Labs, Weight trends, Skin, I & O's  REASON FOR ASSESSMENT:   Malnutrition Screening Tool    ASSESSMENT:   Anthony Hartman is a 79 y.o. male with a PMH of CAD (first MI in 2012 with unsuccessful PCI attempt to OM1, s/p BMS to RCA in 2017, with subsequent PCI to 2nd diagonal 04/2019), severe AS, HTN, HLD, OSA, GERD, BPH, and bladder cancer, who presented with chest pain.  Pt admitted with severe aortic stenosis.   Reviewed I/O's: -1.1 L x 24 hours and -1.3 L since admission  UOP: 1.7 L x 24 hours  Per CVTS notes, plan for CABG and aortic valve replacement on Friday (08/26/20).   Spoke with pt and wife at bedside, who were pleasant and in good spirits today. Pt smiling and joking with this RD throughout the interview; he shares that he understands the gravity of the situation but is staying positive. He reports he has a great appetite and has been consuming about 75% of his meals.   PTA pt reports he consumed 3 meals per day and a snack (Breakfast: cereal OR eggs, toast, and bacon; Lunch: sandwich; Snack: fruit, Dinner: meat, starch, and vegetable). Pt shares that they do not season their food with anything other than pepper and he tries not to eat processed foods, however, enjoys junk food and scotch on occasion.   Pt estimates he has lost some weight over the past month; he shares his UBW is around 245#. Reviewed wt hx, however, and wt has been stable over the past 3 months. Pt has had decreased mobility due to SOB. He explains he is avid golfer, but has reduced his  time on the golf course due to this.   Discussed importance of good meal and supplement intake to promote healing. Pt amenable to try Ensure supplements.   Labs reviewed.   NUTRITION - FOCUSED PHYSICAL EXAM:    Most Recent Value  Orbital Region No depletion  Upper Arm Region No depletion  Thoracic and Lumbar Region No depletion  Buccal Region No depletion  Temple Region No depletion  Clavicle Bone Region No depletion  Clavicle and Acromion Bone Region No depletion  Scapular Bone Region No depletion  Dorsal Hand No depletion  Patellar Region No depletion  Anterior Thigh Region No depletion  Posterior Calf Region Mild depletion  Edema (RD Assessment) None  Hair Reviewed  Eyes Reviewed  Mouth Reviewed  Skin Reviewed  Nails Reviewed       Diet Order:   Diet Order            Diet Heart Room service appropriate? Yes; Fluid consistency: Thin  Diet effective now                 EDUCATION NEEDS:   Education needs have been addressed  Skin:  Skin Assessment: Reviewed RN Assessment  Last BM:  08/23/20  Height:   Ht Readings from Last 1 Encounters:  08/23/20 6\' 4"  (1.93 m)    Weight:   Wt Readings from Last 1 Encounters:  08/24/20 106.1 kg  Ideal Body Weight:  91.8 kg  BMI:  Body mass index is 28.46 kg/m.  Estimated Nutritional Needs:   Kcal:  2500-2700  Protein:  130-145 grams  Fluid:  > 2 L    Loistine Chance, RD, LDN, [place] Registered Dietitian II Certified Diabetes Care and Education Specialist Please refer to [facility] for RD and/or RD on-call/weekend/after hours pager

## 2020-08-24 NOTE — Progress Notes (Signed)
Progress Note  Patient Name: LONNELL CHAPUT Date of Encounter: 08/24/2020  [facility] Cardiologist: Minus Breeding, MD   Subjective   No chest pain today. No dyspnea.   Inpatient Medications    Scheduled Meds: . ALPRAZolam  0.5 mg Oral QHS  . aspirin EC  81 mg Oral Daily  . buPROPion  300 mg Oral Daily  . carvedilol  6.25 mg Oral BID WC  . chlorhexidine  15 mL Mouth Rinse BID  . isosorbide mononitrate  60 mg Oral Daily  . mouth rinse  15 mL Mouth Rinse q12n4p  . sodium chloride flush  3 mL Intravenous Q12H  . sodium chloride flush  3 mL Intravenous Q12H  . sodium chloride flush  3 mL Intravenous Q12H   Continuous Infusions: . sodium chloride    . heparin 1,500 Units/hr (08/24/20 0926)   PRN Meds: sodium chloride, famotidine, magnesium hydroxide, nitroGLYCERIN, ondansetron (ZOFRAN) IV, oxyCODONE, polyvinyl alcohol, sodium chloride flush, traMADol   Vital Signs    Vitals:   08/24/20 0400 08/24/20 0500 08/24/20 0600 08/24/20 0837  BP:  125/81    Pulse:      Resp: 10 (!) 9  18  Temp:  98.1 F (36.7 C)  98.2 F (36.8 C)  TempSrc:  Oral  Oral  SpO2: 94% 96% 95%   Weight:  106.1 kg    Height:        Intake/Output Summary (Last 24 hours) at 08/24/2020 0929 Last data filed at 08/24/2020 5366 Gross per 24 hour  Intake 587.18 ml  Output 1675 ml  Net -1087.82 ml   Last 3 Weights 08/24/2020 08/23/2020 08/22/2020  Weight (lbs) 233 lb 12.8 oz 234 lb 12.8 oz 230 lb  Weight (kg) 106.051 kg 106.505 kg 104.327 kg      Telemetry    sinus- Personally Reviewed  ECG    No AM EKG- Personally Reviewed  Physical Exam   General: Well developed, well nourished, NAD  HEENT: OP clear, mucus membranes moist  SKIN: warm, dry. No rashes. Neuro: No focal deficits  Musculoskeletal: Muscle strength 5/5 all ext  Psychiatric: Mood and affect normal  Neck: No JVD, no carotid bruits, no thyromegaly, no lymphadenopathy.  Lungs:Clear bilaterally, no wheezes, rhonci,  crackles Cardiovascular: Regular rate and rhythm. Systolic murmur.  Abdomen:Soft. Bowel sounds present. Non-tender.  Extremities: No lower extremity edema. Pulses are 2 + in the bilateral DP/PT.   Labs    High Sensitivity Troponin:   Recent Labs  Lab 08/22/20 0542 08/22/20 0921 08/22/20 1542  TROPONINIHS 247* 736* 1,625*      Chemistry Recent Labs  Lab 08/22/20 0542 08/22/20 0542 08/22/20 4403 08/22/20 0619 08/23/20 0242 08/23/20 0242 08/23/20 1121 08/23/20 1123 08/24/20 0232  NA 136   < > 136   < > 137   < > 139 139 135  K 4.1   < > 4.1   < > 4.0   < > 4.2 4.1 4.1  CL 102   < > 101  --  103  --   --   --  103  CO2 24  --   --   --  24  --   --   --  23  GLUCOSE 103*   < > 100*  --  76  --   --   --  93  BUN 19   < > 24*  --  14  --   --   --  13  CREATININE 1.51*   < >  1.40*  --  1.31*  --   --   --  1.35*  CALCIUM 9.2  --   --   --  8.6*  --   --   --  8.5*  PROT 5.5*  --   --   --   --   --   --   --   --   ALBUMIN 3.6  --   --   --   --   --   --   --   --   AST 30  --   --   --   --   --   --   --   --   ALT 24  --   --   --   --   --   --   --   --   ALKPHOS 35*  --   --   --   --   --   --   --   --   BILITOT 0.9  --   --   --   --   --   --   --   --   GFRNONAA 43*  --   --   --  51*  --   --   --  50*  ANIONGAP 10  --   --   --  10  --   --   --  9   < > = values in this interval not displayed.     Hematology Recent Labs  Lab 08/22/20 0542 08/22/20 7628 08/23/20 0242 08/23/20 0242 08/23/20 1121 08/23/20 1123 08/24/20 0232  WBC 6.5  --  7.1  --   --   --  7.0  RBC 5.50  --  5.29  --   --   --  5.18  HGB 17.9*   < > 17.3*   < > 17.3* 17.3* 16.8  HCT 53.4*   < > 52.0   < > 51.0 51.0 50.5  MCV 97.1  --  98.3  --   --   --  97.5  MCH 32.5  --  32.7  --   --   --  32.4  MCHC 33.5  --  33.3  --   --   --  33.3  RDW 12.5  --  12.6  --   --   --  12.7  PLT 187  --  186  --   --   --  169   < > = values in this interval not displayed.    BNPNo  results for input(s): BNP, PROBNP in the last 168 hours.   DDimer No results for input(s): DDIMER in the last 168 hours.   Radiology    CARDIAC CATHETERIZATION  Result Date: 08/23/2020  Severe aortic stenosis with recent progression in symptoms causing significant limitation in physical activity.  Unable to cross the aortic valve for hemodynamic assessment.  [place] 111 mid vessel bifurcation stenosis.  The previously dilated diagonal branch has restenosed to greater than 90%.  The previously untreated LAD has DFR of 0.84.  Obtuse marginal #1 with 99% stenosis with competitive flow.  This artery functions as a total occlusion and has had 2 failed PCI attempts in the past.  Left main is widely patent  RCA is dominant, tortuous, but does not have any focal high-grade stenosis.  There is diffuse disease throughout the vessel up to 40 to 50%.  Normal pulmonary artery pressures.  Pulmonary  capillary wedge mean pressure is 9 mmHg. RECOMMENDATIONS:  Symptomatic aortic stenosis and angina due to significant obtuse marginal and LAD diagonal disease.  Heart valve team consideration of complex PCI followed by TAVR versus aortic valve replacement along with coronary bypass grafting to the LAD, diagonal, and first obtuse marginal.  ECHOCARDIOGRAM COMPLETE  Result Date: 08/22/2020    ECHOCARDIOGRAM REPORT   Patient Name:   [name] Date of Exam: 08/22/2020 Medical Rec #:  [id number]      Height:       76.0 in Accession #:    [id number]     Weight:       230.0 lb Date of Birth:  [birth year]      BSA:          2.351 m Patient Age:    79 years       BP:           117/65 mmHg Patient Gender: M              HR:           62 bpm. Exam Location:  Inpatient Procedure: 2D Echo, Cardiac Doppler and Color Doppler Indications:    R07.9* Chest pain, unspecified  History:        Patient has prior history of Echocardiogram examinations, most                 recent 05/23/2020. Previous Myocardial Infarction; Risk                  Factors:Hypertension, Dyslipidemia and Sleep Apnea. Cancer.                 GERD.  Sonographer:    Jonelle Sidle Dance Referring Phys: [id number] [place]  1. Left ventricular ejection fraction, by estimation, is 50 to 55%. The left ventricle has low normal function. The left ventricle demonstrates regional wall motion abnormalities (see scoring diagram/findings for description). There is moderate concentric left ventricular hypertrophy. Left ventricular diastolic parameters are consistent with Grade I diastolic dysfunction (impaired relaxation). There is mild hypokinesis of the left ventricular, basal anterior wall, anterolateral wall and inferolateral wall.  2. Right ventricular systolic function is normal. The right ventricular size is normal.  3. Left atrial size was severely dilated.  4. Right atrial size was mild to moderately dilated.  5. The mitral valve is normal in structure. Trivial mitral valve regurgitation. No evidence of mitral stenosis. Moderate mitral annular calcification.  6. The aortic valve is calcified. There is severe calcifcation of the aortic valve. There is severe thickening of the aortic valve. Aortic valve regurgitation is moderate. Severe aortic valve stenosis. Aortic valve area, by VTI measures 0.63 cm. Aortic  valve mean gradient measures 42.0 mmHg. Aortic valve Vmax measures 4.17 m/s.  7. Pulmonic valve regurgitation is moderate.  8. The inferior vena cava is normal in size with greater than 50% respiratory variability, suggesting right atrial pressure of 3 mmHg.  9. Possible left to right shunt cannot be excluded. Comparison(s): Changes from prior study are noted. Conclusion(s)/Recommendation(s): Severe AS with moderate AI. EF overal low normal, but on several views the basal anterior, anteriolateral, and inferolateral walls are mildly hypokinetic. FINDINGS  Left Ventricle: Left ventricular ejection fraction, by estimation, is 50 to 55%. The left ventricle has low  normal function. The left ventricle demonstrates regional wall motion abnormalities. Mild hypokinesis of the left ventricular, basal anterior wall, anterolateral wall and inferolateral wall. The left ventricular internal cavity  size was normal in size. There is moderate concentric left ventricular hypertrophy. Left ventricular diastolic parameters are consistent with Grade I diastolic dysfunction (impaired relaxation). Right Ventricle: The right ventricular size is normal. No increase in right ventricular wall thickness. Right ventricular systolic function is normal. Left Atrium: Left atrial size was severely dilated. Right Atrium: Right atrial size was mild to moderately dilated. Pericardium: There is no evidence of pericardial effusion. Presence of pericardial fat pad. Mitral Valve: The mitral valve is normal in structure. There is moderate thickening of the mitral valve leaflet(s). There is mild calcification of the mitral valve leaflet(s). Moderate mitral annular calcification. Trivial mitral valve regurgitation. No evidence of mitral valve stenosis. Tricuspid Valve: The tricuspid valve is normal in structure. Tricuspid valve regurgitation is trivial. No evidence of tricuspid stenosis. Aortic Valve: The aortic valve is calcified. There is severe calcifcation of the aortic valve. There is severe thickening of the aortic valve. There is moderate aortic valve annular calcification. Aortic valve regurgitation is moderate. Aortic regurgitation PHT measures 365 msec. Severe aortic stenosis is present. Aortic valve mean gradient measures 42.0 mmHg. Aortic valve peak gradient measures 69.6 mmHg. Aortic valve area, by VTI measures 0.63 cm. Pulmonic Valve: The pulmonic valve was grossly normal. Pulmonic valve regurgitation is moderate. No evidence of pulmonic stenosis. Aorta: The aortic root and ascending aorta are structurally normal, with no evidence of dilitation. Venous: The inferior vena cava is normal in size with  greater than 50% respiratory variability, suggesting right atrial pressure of 3 mmHg. IAS/Shunts: Possible left to right shunt cannot be excluded.  LEFT VENTRICLE PLAX 2D LVIDd:         4.95 cm  Diastology LVIDs:         4.27 cm  LV e' medial:    3.48 cm/s LV PW:         1.38 cm  LV E/e' medial:  17.7 LV IVS:        1.42 cm  LV e' lateral:   5.55 cm/s LVOT diam:     2.20 cm  LV E/e' lateral: 11.1 LV SV:         72 LV SV Index:   31 LVOT Area:     3.80 cm  RIGHT VENTRICLE            IVC RV Basal diam:  3.28 cm    IVC diam: 1.69 cm RV Mid diam:    2.06 cm RV S prime:     7.94 cm/s TAPSE (M-mode): 1.8 cm LEFT ATRIUM              Index       RIGHT ATRIUM           Index LA diam:        4.80 cm  2.04 cm/m  RA Area:     20.80 cm LA Vol (A2C):   105.0 ml 44.67 ml/m RA Volume:   67.90 ml  28.88 ml/m LA Vol (A4C):   172.0 ml 73.17 ml/m LA Biplane Vol: 148.0 ml 62.96 ml/m  AORTIC VALVE AV Area (Vmax):    0.65 cm AV Area (Vmean):   0.63 cm AV Area (VTI):     0.63 cm AV Vmax:           417.00 cm/s AV Vmean:          301.000 cm/s AV VTI:            1.140 m AV Peak Grad:  69.6 mmHg AV Mean Grad:      42.0 mmHg LVOT Vmax:         71.70 cm/s LVOT Vmean:        49.800 cm/s LVOT VTI:          0.189 m LVOT/AV VTI ratio: 0.17 AI PHT:            365 msec  AORTA Ao Root diam: 3.20 cm Ao Asc diam:  3.40 cm MITRAL VALVE MV Area (PHT): 2.62 cm    SHUNTS MV Decel Time: 289 msec    Systemic VTI:  0.19 m MV E velocity: 61.60 cm/s  Systemic Diam: 2.20 cm MV A velocity: 77.10 cm/s MV E/A ratio:  0.80 Buford Dresser MD Electronically signed by Buford Dresser MD Signature Date/Time: 08/22/2020/3:37:03 PM    Final     Cardiac Studies    Patient Profile     79 y.o. male with a PMH of CAD (first MI in 2012 with unsuccessful PCI attempt to OM1, s/p BMS to RCA in 2017, with subsequent PCI to 2nd diagonal 04/2019), severe AS, HTN, HLD, OSA, GERD, BPH, and bladder cancer, who presented with chest pain and found to have  a NSTEMI. Cardiac cath with severe mid LAD stenosis at the takeoff of the large Diagonal branch. The LAD lesion is flow limiting by pressure wire (DFR 0.84). The Diagonal ostial stenosis has recurred following balloon angioplasty in June 2020. He has had a functional occlusion of an obtuse marginal branch for years with prior failed PCI of this branch.   Assessment & Plan    1. CAD with unstable angina/NSTEMI 2. Severe aortic stenosis  PCI of the LAD and Diagonal is not felt to be a good option for revascularization. He has been seen by Dr. Cyndia Bent today with CT surgery and planning underway for surgical AVR and bypass later this week.   Will continue ASA, beta blocker and Imdur. He is statin intolerant. He is on a Repatha.    For questions or updates, please contact [place] Please consult [email or web address] for contact info under        Signed, Lauree Chandler, MD  08/24/2020, 9:29 AM

## 2020-08-24 NOTE — Progress Notes (Signed)
[facility] for Heparin Indication: chest pain/ACS  Allergies  Allergen Reactions   Statins     MD ORDERS UNSPECIFIED REACTION     Augmentin [Amoxicillin-Pot Clavulanate] Nausea And Vomiting     Has patient had a PCN reaction causing immediate rash, facial/tongue/throat swelling, SOB or lightheadedness with hypotension: No Has patient had a PCN reaction causing severe rash involving mucus membranes or skin necrosis: No Has patient had a PCN reaction that required hospitalization No Has patient had a PCN reaction occurring within the last 10 years: No If all of the above answers are "NO", then may proceed with Cephalosporin use.    Dexamethasone Other (See Comments)    Frequent Urination [? HYPERGLYCEMIA? ]   Sulfa Antibiotics Nausea And Vomiting    Patient Measurements: Height: 6\' 4"  (193 cm) Weight: 106.1 kg (233 lb 12.8 oz) (scale C) IBW/kg (Calculated) : 86.8 Heparin Dosing Weight: 100 kg  Vital Signs: Temp: 98.2 F (36.8 C) (10/13 0837) Temp Source: Oral (10/13 0837) BP: 125/81 (10/13 0500) Pulse Rate: 70 (10/12 2338)  Labs: Recent Labs    08/22/20 0542 08/22/20 0542 08/22/20 2751 08/22/20 7001 08/22/20 0921 08/22/20 1233 08/22/20 1542 08/22/20 2209 08/23/20 0242 08/23/20 0242 08/23/20 0243 08/23/20 1121 08/23/20 1121 08/23/20 1123 08/24/20 0232 08/24/20 0735  HGB 17.9*   < > 18.0*   < >  --   --   --   --  17.3*   < >  --  17.3*   < > 17.3* 16.8  --   HCT 53.4*   < > 53.0*   < >  --   --   --   --  52.0   < >  --  51.0  --  51.0 50.5  --   PLT 187  --   --   --   --   --   --   --  186  --   --   --   --   --  169  --   APTT 31  --   --   --   --   --   --   --   --   --   --   --   --   --   --   --   LABPROT 13.3  --   --   --   --   --   --   --   --   --   --   --   --   --   --   --   INR 1.1  --   --   --   --   --   --   --   --   --   --   --   --   --   --   --   HEPARINUNFRC  --   --   --   --   --    <  >  --  0.39  --   --  0.39  --   --   --   --  0.28*  CREATININE 1.51*   < > 1.40*  --   --   --   --   --  1.31*  --   --   --   --   --  1.35*  --   TROPONINIHS 247*  --   --   --  736*  --  1,625*  --   --   --   --   --   --   --   --   --    < > = values in this interval not displayed.    Estimated Creatinine Clearance: 59.3 mL/min (A) (by C-G formula based on SCr of 1.35 mg/dL (H)).   Medical History: Past Medical History:  Diagnosis Date   Anxiety    Arthritis    Bladder cancer ([place])    Bladder stone    BPH (benign prostatic hypertrophy)    Depression    GERD (gastroesophageal reflux disease)    History of kidney stones    History of myocardial infarction    07-17-2011--   MI W/ OCCLUDED OM1. 2017 occluded RCA treated with a bare-metal stent, high-grade ramus intermediate stenosis in 2018 with failed attempted PCI   Hyperlipidemia    Hypertension    Myocardial infarction [facility]) 2012; 2017   2012 - noted Subtotal CTO of OM/RI;  09/2016 - Acute Infer STEMI - BMS x 2 mRCA (3.5 mm x 15 & 3.5 x 12 - tapered 4.0 - 3.8 mm)   OSA (obstructive sleep apnea)    cpap non-compliant    Prostate cancer (HCC)     Medications:  No current facility-administered medications on file prior to encounter.   Current Outpatient Medications on File Prior to Encounter  Medication Sig Dispense Refill   acetaminophen (TYLENOL) 500 MG tablet Take 500 mg by mouth every 6 (six) hours as needed (pain.).     ALPRAZolam (XANAX) 1 MG tablet Take 0.5 mg by mouth at bedtime.     Bempedoic Acid (NEXLETOL) 180 MG TABS Take 180 mg by mouth daily. 90 tablet 1   buPROPion (WELLBUTRIN XL) 300 MG 24 hr tablet Take 300 mg by mouth daily.     carvedilol (COREG) 6.25 MG tablet Take 1 tablet (6.25 mg total) by mouth 2 (two) times daily with a meal. 180 tablet 3   clopidogrel (PLAVIX) 75 MG tablet TAKE 1 TABLET BY MOUTH EVERY DAY (Patient taking differently: Take 75 mg by mouth daily. ) 90 tablet 2    Evolocumab (REPATHA SURECLICK) 413 MG/ML SOAJ Inject 140 mg into the skin every 14 (fourteen) days. 2 pen 11   famotidine (PEPCID) 20 MG tablet Take 20 mg by mouth 2 (two) times daily as needed (acid reflux/indigestion.).      isosorbide mononitrate (IMDUR) 60 MG 24 hr tablet TAKE 1 TABLET BY MOUTH ONCE (1) DAILY (Patient taking differently: Take 60 mg by mouth daily. ) 90 tablet 2   losartan (COZAAR) 25 MG tablet Take 1 tablet (25 mg total) by mouth daily. 90 tablet 2   meloxicam (MOBIC) 7.5 MG tablet Take 1 tablet (7.5 mg total) by mouth daily as needed for pain. 30 tablet 3   Multiple Vitamin (MULTIVITAMIN WITH MINERALS) TABS tablet Take 1 tablet by mouth daily.     nitroGLYCERIN (NITROSTAT) 0.4 MG SL tablet Place 1 tablet (0.4 mg total) under the tongue every 5 (five) minutes as needed for chest pain. 25 tablet 6   Polyethyl Glycol-Propyl Glycol (LUBRICANT EYE DROPS) 0.4-0.3 % SOLN Place 1 drop into both eyes 3 (three) times daily as needed (dry/irritated eyes).     testosterone cypionate (DEPOTESTOSTERONE CYPIONATE) 200 MG/ML injection Inject 120 mg into the muscle every 14 (fourteen) days.      valACYclovir (VALTREX) 1000 MG tablet Take 1,000 mg by mouth 3 (three) times daily as needed (fever blisters).  cefUROXime (CEFTIN) 500 MG tablet Take 500 mg by mouth 2 (two) times daily. (Patient not taking: Reported on 08/22/2020)     doxycycline (ADOXA) 100 MG tablet  (Patient not taking: Reported on 08/22/2020)     HYDROcodone-acetaminophen (NORCO/VICODIN) 5-325 MG tablet Take 1 tablet by mouth 3 (three) times daily as needed. (Patient not taking: Reported on 07/26/2020)       Assessment: 20 yoM with hx CAD and severe AS admitted with CP and started on IV heparin. Pt s/p LHC and heparin resumed with possible staged PCI as TAVR workup continues.  Heparin level slightly subtherapeutic at 0.28, CBC stable  Goal of Therapy:  Heparin level 0.3-0.7 units/ml Monitor platelets by  anticoagulation protocol: Yes   Plan:  -Increase heparin to 1500 units/h -Daily heparin level and CBC    Arrie Senate, PharmD, BCPS Clinical Pharmacist [phone] Please check AMION for all [place] numbers 08/24/2020

## 2020-08-24 NOTE — Consult Note (Addendum)
[facility] VALVE TEAM  Inpatient TAVR Consultation:   Patient ID: Anthony Hartman; [id number]; Oct 19, 1941   Admit date: 08/22/2020 Date of Consult: 08/24/2020  Primary Care Provider: Myrlene Broker, MD Primary Cardiologist: Dr. Percival Spanish.    Patient Profile:   Anthony Hartman is a 79 y.o. male with a hx of CAD s/p multiple PCIs, prostate cancer/bladder cancer, BPH, HTN, HLD, obesity, OSA and severe AS who is being seen today for the evaluation of severe AS and multivessel CAD at the request of Dr. Angelena Form.  History of Present Illness:   Anthony Hartman lives in [place], [place] with his wife. He is very active working outside. He has had regular dental work with no ongoing issues.  He plays golf a few times per week.  He has been followed by Dr. Percival Spanish for CAD and aortic stenosis. His cardiac history dates back to 2012 when he was found to have an occluded OM, which was managed medically. He then presented with an inferior MI in 2017 and underwent PCI/BMS to RCA. Cath in 2018 showed an occluded ramus that could not be crossed. He then represented in 04/2019 with [place]. Cath at that time showed 95% 2nd diagonal stenosis managed with balloon angioplasty, with medically managed patent RCA stent with 25% ISR, 99% OM1 stenosis (essentially a CTO), 25% mLCx stenosis, 25% pLAD stenosis, 50% mLAD stenosis, with EF 55-65% and moderate AS.  He underwent ETT in 05/2020 which did not show any high risk findings, although it was a submaximal study. Echo in 05/2020 showed EF 55-60%, mod LVH, and severe AS with a mean gradient of 46, DVI 0.2, AVA 0.7. He was seen in the office by Dr. Percival Spanish and felt to be minimally symptomatic and close follow up was recommended.   He was in his usual state of health until the early morning of 10/11 when he was awoken from sleep with chest pain. EMS was activated and he was brought to [facility]. Labs showed Cr 1.5>1.4, Hgb 18, PLT 187, LDL 68,  HsTrop 247--> 736--> 1625. EKG with sinus rhythm, rate 64 bpm, chronic RBBB, new inferolateral TWI, submm STE in aVR, V1-3. CXR without acute findings. Respiratory panel negative for influenza/COVID-19. He was given SL nitro x2 and aspirin en route to the ED and  IV fentanyl with resolution of chest pain upon arrival. He was started on a heparin gtt and admitted by cardiology.   Repeat echo 10/11 showed EF 50-55%, mod concentric LVH, mild hypokinesis of LV, basal anterior wasll, anterolateral wall and inferolateral wall, severe LAE, mild-mod RAE, moderate MAC, severe AS with mean gradient of 42 mm hg, peak gradient 69.6 mm hg, AVA 0.65 cm2, DVI 0.17, moderate AI and moderate PR. L/RHC 10/12 showed severe mid LAD stenosis at the takeoff of the large Diagonal branch (LAD lesion is flow limiting by pressure wire (DFR 0.84)). The Diagonal ostial stenosis has recurred following balloon angioplasty in June 2020. He has had a functional occlusion of an obtuse marginal branch for years with prior failed PCI of this branch. It was felt he would need surgical AVR with bypass vs TAVR and high risk PCI. The intervention in the LAD and Diagonal would be difficult and there is a chance we would not have an optimal result with bifurcation stenting or primary stenting of the LAD with angioplasty alone of the Diagonal.   Given multivessel CAD and severe AS, cardiothoracic surgery was consulted for coronary revascularization and AVR  options.   He reports that he has had fatigue over the past year but has been to high activity level working in his yard and playing golf.  He has had occasional episodes of chest discomfort particularly when he is doing heavy work in his yard.  These have been very mild compared to the episode that brought him to the hospital.  He denies any shortness of breath.  He has had no dizziness or syncope.       Past Medical History:  Diagnosis Date  . Anxiety   . Arthritis   . Bladder cancer  ([place])   . Bladder stone   . BPH (benign prostatic hypertrophy)   . Depression   . GERD (gastroesophageal reflux disease)   . History of bladder cancer    CARCINOMA IN SITU OF BLADDER  . History of kidney stones   . History of myocardial infarction    07-17-2011--   MI W/ OCCLUDED OM1. 2017 occluded RCA treated with a bare-metal stent, high-grade ramus intermediate stenosis in 2018 with failed attempted PCI  . Hyperlipidemia   . Hypertension   . Myocardial infarction [facility]) 2012; 2017   2012 - noted Subtotal CTO of OM/RI;  09/2016 - Acute Infer STEMI - BMS x 2 mRCA (3.5 mm x 15 & 3.5 x 12 - tapered 4.0 - 3.8 mm)  . OSA (obstructive sleep apnea)    cpap non-compliant   . Prostate cancer [facility])     Past Surgical History:  Procedure Laterality Date  . ARTHRODESIS METATARSALPHALANGEAL JOINT (MTPJ) Right 02/20/2018   Procedure: Right Hallux Metatarsal Phalangeal Joint Arthrodesis; Right Silver Bunionectomy;  Surgeon: Wylene Simmer, MD;  Location: [place];  Service: Orthopedics;  Laterality: Right;  . BACK SURGERY    . CARDIOVASCULAR STRESS TEST  04/22/11  . CORONARY ANGIOPLASTY  03/2017    failed PCI RI (OM1) 2018 -- similar to 2012.  . CORONARY STENT INTERVENTION  09/2016   [facility] (Acute Inferior STEMI)- Acute mid RCA 100% --> Integrity BMS 3.5 mm x 73m (3.8 mm) & 3.5 mm x 12 mm (4.1 mm) --BMS used because of need for back surgery  . CORONARY STENT INTERVENTION N/A 05/01/2019   Procedure: CORONARY STENT INTERVENTION;  Surgeon: VJettie Booze MD;  [facility];  Service: Cardiovascular;  Laterality: N/A;  . CYSTO/ BLADDER BX'S  X2  2001  / X1  2003  . CYSTO/ BLADDER BX'S/ BILATERAL RETROGRADE URETEROPYLEGRAM  2003; 2007; 2008;  12-30-2007   CARCINOMA IN SITU OF BLADDER  . CYSTOSCOPY WITH LITHOLAPAXY N/A 05/07/2013   Procedure: CYSTOSCOPY WITH LITHOLAPAXY;  Surgeon: SFranchot Gallo MD;  [facility];  Service: Urology;  Laterality:  N/A;  . EHL Lengthening Right 05/10/2017   RT FOOT  . Hammer Toe Repair Right 05/10/2017   Rt #2 Toe  . HAMMERTOE RECONSTRUCTION WITH WEIL OSTEOTOMY Right 02/20/2018   Procedure: Right Second Metatarsal WPriscille Heidelbergand Revision Hammertoe Correction;  Surgeon: HWylene Simmer MD;  [place] [facility]  Service: Orthopedics;  Laterality: Right;  . INTRAVASCULAR PRESSURE WIRE/FFR STUDY N/A 08/23/2020   Procedure: INTRAVASCULAR PRESSURE WIRE/FFR STUDY;  Surgeon: SBelva Crome MD;  [facility];  Service: Cardiovascular;  Laterality: N/A;  . LEFT HEART CATH AND CORONARY ANGIOGRAPHY  07/17/2011   OCCLUDED OM1 WITH UNSUCCESSFUL PCI ATTEMPT, O/W MILD TO MODERATE DISEASE, DR ANDY CHIU ([facility])    . LEFT HEART CATH AND CORONARY ANGIOGRAPHY  09/2016   Acute Inferior STEMI ([facility])  - Mid LAD 30%, D2 50%.  OM 1/RI 100%, mid circumflex 30%.  Acute mRCA 100% (thrombotic) - BMS PCI.  EF 50% with inferobasal hypokinesis.  Marland Kitchen LEFT HEART CATH AND CORONARY ANGIOGRAPHY  03/2017   (Dr. Chuck Hint - [facility]): OM(RI) ~ CTO (unfixable PCI unable to cross with wire/balloon. pLAD 40%, ost D1 40%;  prox-mid RCA 15%.  Mild stenosis with small mid LCx.  EF 60%.  Marland Kitchen LEFT HEART CATH AND CORONARY ANGIOGRAPHY N/A 05/01/2019   Procedure: LEFT HEART CATH AND CORONARY ANGIOGRAPHY;  Surgeon: Jettie Booze, MD;  Location: [facility] [place] [facility];  Service: Cardiovascular;  Laterality: N/A;  . LUMBAR DECOMPRESSION FORAMINOTOMY L3 -- L5/  REMOVAL CYST L4-5 FACET JOINT  08-21-2009   ALSO HAD PRIOR BACK SURG IN 1992  . LUMBAR LAMINECTOMY  04/04/2017   L2-3  . LUMBAR LAMINECTOMY/DECOMPRESSION MICRODISCECTOMY N/A 04/04/2017   Procedure: Laminectomy and Foraminotomy - Lumbar Two-Three;  Surgeon: Eustace Moore, MD;  Location: [place] [place];  Service: Neurosurgery;  Laterality: N/A;  . OPEN TENOTOMY OF FLEXOR 2ND AND 3RD RIGHT TOES  APRIL 2008  . PROSTATE BIOPSY    . RIGHT/LEFT HEART  CATH AND CORONARY ANGIOGRAPHY N/A 08/23/2020   Procedure: RIGHT/LEFT HEART CATH AND CORONARY ANGIOGRAPHY;  Surgeon: Belva Crome, MD;  Location: [facility] [facility];  Service: Cardiovascular;  Laterality: N/A;  . TRANSTHORACIC ECHOCARDIOGRAM  07/17/2011   at [facility], MILD TO MODERATE CONCENTRIC LVH/ NORMAL LVSF/ EF 55-60%/ MILD AORTIC STENOSIS  . TRANSTHORACIC ECHOCARDIOGRAM  06/2018   ([place]): Normal LV function/thickness.  EF 50 to 55%.  Normal wall motion.  Mild to moderate MAC.  Moderate to Severe Aortic Stenosis (calcified).  Mild aortic insufficiency.  . TRANSURETHRAL RESECTION OF BLADDER TUMOR  01-10-2004  . TRANSURETHRAL RESECTION OF PROSTATE N/A 05/07/2013   Procedure: TRANSURETHRAL RESECTION OF THE PROSTATE WITH GYRUS INSTRUMENTS;  Surgeon: Franchot Gallo, MD;  Location[facility];  Service: Urology;  Laterality: N/A;     Inpatient Medications: Scheduled Meds: . ALPRAZolam  0.5 mg Oral QHS  . aspirin EC  81 mg Oral Daily  . buPROPion  300 mg Oral Daily  . carvedilol  6.25 mg Oral BID WC  . chlorhexidine  15 mL Mouth Rinse BID  . influenza vaccine adjuvanted  0.5 mL Intramuscular Tomorrow-1000  . isosorbide mononitrate  60 mg Oral Daily  . mouth rinse  15 mL Mouth Rinse q12n4p  . sodium chloride flush  3 mL Intravenous Q12H  . sodium chloride flush  3 mL Intravenous Q12H  . sodium chloride flush  3 mL Intravenous Q12H   Continuous Infusions: . sodium chloride    . heparin 1,400 Units/hr (08/24/20 0600)   PRN Meds: sodium chloride, famotidine, magnesium hydroxide, nitroGLYCERIN, ondansetron (ZOFRAN) IV, oxyCODONE, polyvinyl alcohol, sodium chloride flush, traMADol  Allergies:    Allergies  Allergen Reactions  . Statins     MD ORDERS UNSPECIFIED REACTION    . Augmentin [Amoxicillin-Pot Clavulanate] Nausea And Vomiting     Has patient had a PCN reaction causing immediate rash, facial/tongue/throat swelling, SOB or lightheadedness with hypotension: No Has  patient had a PCN reaction causing severe rash involving mucus membranes or skin necrosis: No Has patient had a PCN reaction that required hospitalization No Has patient had a PCN reaction occurring within the last 10 years: No If all of the above answers are "NO", then may proceed with Cephalosporin use.   Marland Kitchen  Dexamethasone Other (See Comments)    Frequent Urination [? HYPERGLYCEMIA? ]  . Sulfa Antibiotics Nausea And Vomiting    Social History:   Social History   Socioeconomic History  . Marital status: Married    Spouse name: Not on file  . Number of children: Not on file  . Years of education: Not on file  . Highest education level: Not on file  Occupational History  . Occupation: Retired  Tobacco Use  . Smoking status: Former Smoker    Packs/day: 0.50    Years: 50.00    Pack years: 25.00    Types: Cigarettes    Quit date: 04/15/2010    Years since quitting: 10.3  . Smokeless tobacco: Current User    Types: Snuff  . Tobacco comment: occasional  snuff pack  Vaping Use  . Vaping Use: Never used  Substance and Sexual Activity  . Alcohol use: Yes    Alcohol/week: 2.0 standard drinks    Types: 2 Cans of beer per week    Comment: social  . Drug use: No  . Sexual activity: Yes  Other Topics Concern  . Not on file  Social History Narrative   he prefers Dr. Percival Spanish to going back to the cardiologist at [facility] since Dr. Wyline Copas is no longer there.   Social Determinants of Health   Financial Resource Strain:   . Difficulty of Paying Living Expenses: Not on file  Food Insecurity:   . Worried About Charity fundraiser in the Last Year: Not on file  . Ran Out of Food in the Last Year: Not on file  Transportation Needs:   . Lack of Transportation (Medical): Not on file  . Lack of Transportation (Non-Medical): Not on file  Physical Activity:   . Days of Exercise per Week: Not on file  . Minutes of Exercise per Session: Not on file  Stress:   . Feeling of Stress : Not on  file  Social Connections:   . Frequency of Communication with Friends and Family: Not on file  . Frequency of Social Gatherings with Friends and Family: Not on file  . Attends Religious Services: Not on file  . Active Member of Clubs or Organizations: Not on file  . Attends Archivist Meetings: Not on file  . Marital Status: Not on file  Intimate Partner Violence:   . Fear of Current or Ex-Partner: Not on file  . Emotionally Abused: Not on file  . Physically Abused: Not on file  . Sexually Abused: Not on file    Family History:   The patient's family history includes Cancer in his father and mother; Heart failure in his mother.  ROS:  Please see the history of present illness.  Review of Systems  Constitutional: Positive for malaise/fatigue.  HENT:       Sees his dentist regularly.  Has had dental implants previously  Eyes: Negative.   Cardiovascular: Positive for chest pain. Negative for dyspnea on exertion, orthopnea, paroxysmal nocturnal dyspnea and syncope.       Has had mild edema in his left ankle and foot  Respiratory: Negative for shortness of breath.        Has had some congestion.  Endocrine: Negative.   Hematologic/Lymphatic: Negative.   Skin: Negative.   Musculoskeletal: Negative.   Gastrointestinal: Negative.   Genitourinary: Negative.   Neurological: Negative for dizziness.  Psychiatric/Behavioral: Negative.   Allergic/Immunologic: Negative.     All other ROS reviewed and negative.  Physical Exam/Data:   Vitals:   08/24/20 0300 08/24/20 0400 08/24/20 0500 08/24/20 0600  BP:   125/81   Pulse:      Resp: 11 10 (!) 9   Temp:   98.1 F (36.7 C)   TempSrc:   Oral   SpO2: 93% 94% 96% 95%  Weight:   106.1 kg   Height:        Intake/Output Summary (Last 24 hours) at 08/24/2020 0801 Last data filed at 08/24/2020 0600 Gross per 24 hour  Intake 584.18 ml  Output 1675 ml  Net -1090.82 ml   Filed Weights   08/22/20 0538 08/23/20 0312  08/24/20 0500  Weight: 104.3 kg 106.5 kg 106.1 kg   Body mass index is 28.46 kg/m.  General:  Well nourished, well developed, in no acute distress, healthy appearing HEENT: normal Lymph: no adenopathy Neck: no JVD Endocrine:  No thryoid Cardiac:  normal S1, S2; RRR; 2/6 SEM right sternal border.  No diastolic murmur. Lungs:  clear to auscultation bilaterally, no wheezing, rhonchi or rales  Abd: soft, nontender, no hepatomegaly  Ext: no edema, pedal pulses palpable bilaterally Musculoskeletal:  No deformities, BUE and BLE strength normal and equal Skin: warm and dry  Neuro:  CNs 2-12 intact, no focal abnormalities noted Psych:  Normal affect   EKG:  The EKG was personally reviewed and demonstrates:  Sinus with st/tw abnormalities Telemetry:  Telemetry was personally reviewed and demonstrates:  sinus  Relevant CV Studies:  Echo 08/22/20 1. Left ventricular ejection fraction, by estimation, is 50 to 55%. The left ventricle has low normal function. The left ventricle demonstrates regional wall motion abnormalities (see scoring diagram/findings for description). There is moderate concentric left ventricular hypertrophy. Left ventricular diastolic parameters are consistent with Grade I diastolic dysfunction (impaired relaxation). There is mild hypokinesis of the left ventricular, basal anterior wall, anterolateral wall and inferolateral wall. 2. Right ventricular systolic function is normal. The right ventricular size is normal. 3. Left atrial size was severely dilated. 4. Right atrial size was mild to moderately dilated. 5. The mitral valve is normal in structure. Trivial mitral valve regurgitation. No evidence of mitral stenosis. Moderate mitral annular calcification. 6. The aortic valve is calcified. There is severe calcifcation of the aortic valve. There is severe thickening of the aortic valve. Aortic valve regurgitation is moderate. Severe aortic valve stenosis. Aortic valve  area, by VTI measures 0.63 cm. Aortic valve mean gradient measures 42.0 mmHg. Aortic valve Vmax measures 4.17 m/s. 7. Pulmonic valve regurgitation is moderate. 8. The inferior vena cava is normal in size with greater than 50% respiratory variability, suggesting right atrial pressure of 3 mmHg. 9. Possible left to right shunt cannot be excluded. Comparison(s): Changes from prior study are noted. Conclusion(s)/Recommendation(s): Severe AS with moderate AI. EF overal low normal, but on several views the basal anterior, anteriolateral, and inferolateral walls are mildly hypokinetic. Final Page 1 of 3 Left Ventricle: Left ventricular ejection fraction, by estimation, is 50 to 55%. The left ventricle has low normal function. The left ventricle demonstrates regional wall motion abnormalities. Mild hypokinesis of the left ventricular, basal anterior wall, anterolateral wall and inferolateral wall. The left ventricular internal cavity size was normal in size. There is moderate concentric left ventricular hypertrophy. Left ventricular diastolic parameters are consistent with Grade I diastolic dysfunction (impaired relaxation). Right Ventricle: The right ventricular size is normal. No increase in right ventricular wall thickness. Right ventricular systolic function is normal. Left Atrium: Left atrial size was severely dilated.  Right Atrium: Right atrial size was mild to moderately dilated. Pericardium: There is no evidence of pericardial effusion. Presence of pericardial fat pad. Mitral Valve: The mitral valve is normal in structure. There is moderate thickening of the mitral valve leaflet (s). There is mild calcification of the mitral valve leaflet(s). Moderate mitral annular calcification. Trivial mitral valve regurgitation. No evidence of mitral valve stenosis. Tricuspid Valve: The tricuspid valve is normal in structure. Tricuspid valve regurgitation is trivial. No evidence of tricuspid  stenosis. Aortic Valve: The aortic valve is calcified. There is severe calcifcation of the aortic valve. There is severe thickening of the aortic valve. There is moderate aortic valve annular calcification. Aortic valve regurgitation is moderate. Aortic regurgitation PHT measures 365 msec. Severe aortic stenosis is present. Aortic valve mean gradient measures 42.0 mmHg. Aortic valve peak gradient measures 69.6 mmHg. Aortic valve area, by VTI measures 0.63 cm. Pulmonic Valve: The pulmonic valve was grossly normal. Pulmonic valve regurgitation is moderate. No evidence of pulmonic stenosis. Aorta: The aortic root and ascending aorta are structurally normal, with no evidence of dilitation. Venous: The inferior vena cava is normal in size with greater than 50% respiratory variability, suggesting right atrial pressure of 3 mmHg. IAS/Shunts: Possible left to right shunt cannot be excluded. LEFT VENTRICLE PLAX 2D LVIDd: 4.95 cm LVIDs: 4.27 cm LV PW: 1.38 cm LV IVS: 1.42 cm LVOT diam: 2.20 cm LV SV: 72 LV SV Index: 31 LVOT Area: 3.80 cm Diastology LV e' medial: 3.48 cm/s LV E/e' medial: 17.7 LV e' lateral: 5.55 cm/s LV E/e' lateral: 11.1 Final Anthony Hartman [id number] 08/22/2020 Page 2 of 3 Buford Dresser MD Electronically signed by Buford Dresser MD Signature Date/Time: 08/22/2020/3:37:03 PM RIGHT VENTRICLE RV Basal diam: 3.28 cm RV Mid diam: 2.06 cm RV S prime: 7.94 cm/s TAPSE (M-mode): 1.8 cm IVC IVC diam: 1.69 cm LEFT ATRIUM Index LA diam: 4.80 cm 2.04 cm/m LA Vol (A2C): 105.0 ml 44.67 ml/m LA Vol (A4C): 172.0 ml 73.17 ml/m LA Biplane Vol: 148.0 ml 62.96 ml/m RIGHT ATRIUM Index RA Area: 20.80 cm RA Volume: 67.90 ml 28.88 ml/m AORTIC VALVE AV Area (Vmax): 0.65 cm AV Area (Vmean): 0.63 cm AV Area (VTI): 0.63 cm AV Vmax: 417.00 cm/s AV Vmean: 301.000 cm/s AV VTI: 1.140 m AV Peak Grad: 69.6 mmHg AV Mean Grad: 42.0 mmHg LVOT Vmax: 71.70  cm/s LVOT Vmean: 49.800 cm/s LVOT VTI: 0.189 m LVOT/AV VTI ratio: 0.17 AI PHT: 365 msec AORTA Ao Root diam: 3.20 cm Ao Asc diam: 3.40 cm MITRAL VALVE MV Area (PHT): 2.62 cm MV Decel Time: 289 msec MV E velocity: 61.60 cm/s MV A velocity: 77.10 cm/s MV E/A ratio: 0.80 SHUNTS Systemic VTI: 0.19 m Systemic Diam: 2.20 cm Final  __________________  [facility] 08/23/20 INTRAVASCULAR PRESSURE WIRE/FFR STUDY  RIGHT/LEFT HEART CATH AND CORONARY ANGIOGRAPHY  Conclusion   Severe aortic stenosis with recent progression in symptoms causing significant limitation in physical activity.  Unable to cross the aortic valve for hemodynamic assessment.  [place] 111 mid vessel bifurcation stenosis.  The previously dilated diagonal branch has restenosed to greater than 90%.  The previously untreated LAD has DFR of 0.84.  Obtuse marginal #1 with 99% stenosis with competitive flow.  This artery functions as a total occlusion and has had 2 failed PCI attempts in the past.  Left main is widely patent  RCA is dominant, tortuous, but does not have any focal high-grade stenosis.  There is diffuse disease throughout the vessel up to 40 to 50%.  Normal pulmonary artery pressures.  Pulmonary capillary wedge mean pressure is 9 mmHg.  RECOMMENDATIONS:   Symptomatic aortic stenosis and angina due to significant obtuse marginal and LAD diagonal disease.  Heart valve team consideration of complex PCI followed by TAVR versus aortic valve replacement along with coronary bypass grafting to the LAD, diagonal, and first obtuse marginal.   _________________    Laboratory Data:  Chemistry Recent Labs  Lab 08/22/20 0542 08/22/20 0542 08/22/20 5053 08/22/20 9767 08/23/20 0242 08/23/20 0242 08/23/20 1121 08/23/20 1123 08/24/20 0232  NA 136   < > 136   < > 137   < > 139 139 135  K 4.1   < > 4.1   < > 4.0   < > 4.2 4.1 4.1  CL 102   < > 101  --  103  --   --   --  103  CO2 24  --   --   --  24  --    --   --  23  GLUCOSE 103*   < > 100*  --  76  --   --   --  93  BUN 19   < > 24*  --  14  --   --   --  13  CREATININE 1.51*   < > 1.40*  --  1.31*  --   --   --  1.35*  CALCIUM 9.2  --   --   --  8.6*  --   --   --  8.5*  GFRNONAA 43*  --   --   --  51*  --   --   --  50*  ANIONGAP 10  --   --   --  10  --   --   --  9   < > = values in this interval not displayed.    Recent Labs  Lab 08/22/20 0542  PROT 5.5*  ALBUMIN 3.6  AST 30  ALT 24  ALKPHOS 35*  BILITOT 0.9   Hematology Recent Labs  Lab 08/22/20 0542 08/22/20 0619 08/23/20 0242 08/23/20 0242 08/23/20 1121 08/23/20 1123 08/24/20 0232  WBC 6.5  --  7.1  --   --   --  7.0  RBC 5.50  --  5.29  --   --   --  5.18  HGB 17.9*   < > 17.3*   < > 17.3* 17.3* 16.8  HCT 53.4*   < > 52.0   < > 51.0 51.0 50.5  MCV 97.1  --  98.3  --   --   --  97.5  MCH 32.5  --  32.7  --   --   --  32.4  MCHC 33.5  --  33.3  --   --   --  33.3  RDW 12.5  --  12.6  --   --   --  12.7  PLT 187  --  186  --   --   --  169   < > = values in this interval not displayed.   Cardiac EnzymesNo results for input(s): TROPONINI in the last 168 hours. No results for input(s): TROPIPOC in the last 168 hours.  BNPNo results for input(s): BNP, PROBNP in the last 168 hours.  DDimer No results for input(s): DDIMER in the last 168 hours.  Radiology/Studies:  CARDIAC CATHETERIZATION  Result Date: 08/23/2020  Severe aortic stenosis with recent progression in symptoms causing significant limitation in  physical activity.  Unable to cross the aortic valve for hemodynamic assessment.  [place] 111 mid vessel bifurcation stenosis.  The previously dilated diagonal branch has restenosed to greater than 90%.  The previously untreated LAD has DFR of 0.84.  Obtuse marginal #1 with 99% stenosis with competitive flow.  This artery functions as a total occlusion and has had 2 failed PCI attempts in the past.  Left main is widely patent  RCA is dominant, tortuous, but does  not have any focal high-grade stenosis.  There is diffuse disease throughout the vessel up to 40 to 50%.  Normal pulmonary artery pressures.  Pulmonary capillary wedge mean pressure is 9 mmHg. RECOMMENDATIONS:  Symptomatic aortic stenosis and angina due to significant obtuse marginal and LAD diagonal disease.  Heart valve team consideration of complex PCI followed by TAVR versus aortic valve replacement along with coronary bypass grafting to the LAD, diagonal, and first obtuse marginal.  DG Chest Portable 1 View  Result Date: 08/22/2020 CLINICAL DATA:  Midsternal chest pain. EXAM: PORTABLE CHEST 1 VIEW COMPARISON:  04/05/2020 FINDINGS: 0549 hours. The lungs are clear without focal pneumonia, edema, pneumothorax or pleural effusion. Interstitial markings are diffusely coarsened with chronic features. Cardiopericardial silhouette is at upper limits of normal for size. The visualized bony structures of the thorax show no acute abnormality. Telemetry leads overlie the chest. IMPRESSION: No active disease. Electronically Signed   By: Misty Stanley M.D.   On: 08/22/2020 06:15   ECHOCARDIOGRAM COMPLETE  Result Date: 08/22/2020    ECHOCARDIOGRAM REPORT   Patient Name:   SABASTIAN RAIMONDI Date of Exam: 08/22/2020 Medical Rec #:  [id number]      Height:       76.0 in Accession #:    [id number]     Weight:       230.0 lb Date of Birth:  07-19-1941      BSA:          2.351 m Patient Age:    43 years       BP:           117/65 mmHg Patient Gender: M              HR:           62 bpm. Exam Location:  Inpatient Procedure: 2D Echo, Cardiac Doppler and Color Doppler Indications:    R07.9* Chest pain, unspecified  History:        Patient has prior history of Echocardiogram examinations, most                 recent 05/23/2020. Previous Myocardial Infarction; Risk                 Factors:Hypertension, Dyslipidemia and Sleep Apnea. Cancer.                 GERD.  Sonographer:    Jonelle Sidle Dance Referring Phys: [id number] [place]  1. Left ventricular ejection fraction, by estimation, is 50 to 55%. The left ventricle has low normal function. The left ventricle demonstrates regional wall motion abnormalities (see scoring diagram/findings for description). There is moderate concentric left ventricular hypertrophy. Left ventricular diastolic parameters are consistent with Grade I diastolic dysfunction (impaired relaxation). There is mild hypokinesis of the left ventricular, basal anterior wall, anterolateral wall and inferolateral wall.  2. Right ventricular systolic function is normal. The right ventricular size is normal.  3. Left atrial size was severely dilated.  4. Right atrial size was  mild to moderately dilated.  5. The mitral valve is normal in structure. Trivial mitral valve regurgitation. No evidence of mitral stenosis. Moderate mitral annular calcification.  6. The aortic valve is calcified. There is severe calcifcation of the aortic valve. There is severe thickening of the aortic valve. Aortic valve regurgitation is moderate. Severe aortic valve stenosis. Aortic valve area, by VTI measures 0.63 cm. Aortic  valve mean gradient measures 42.0 mmHg. Aortic valve Vmax measures 4.17 m/s.  7. Pulmonic valve regurgitation is moderate.  8. The inferior vena cava is normal in size with greater than 50% respiratory variability, suggesting right atrial pressure of 3 mmHg.  9. Possible left to right shunt cannot be excluded. Comparison(s): Changes from prior study are noted. Conclusion(s)/Recommendation(s): Severe AS with moderate AI. EF overal low normal, but on several views the basal anterior, anteriolateral, and inferolateral walls are mildly hypokinetic. FINDINGS  Left Ventricle: Left ventricular ejection fraction, by estimation, is 50 to 55%. The left ventricle has low normal function. The left ventricle demonstrates regional wall motion abnormalities. Mild hypokinesis of the left ventricular, basal anterior wall,  anterolateral wall and inferolateral wall. The left ventricular internal cavity size was normal in size. There is moderate concentric left ventricular hypertrophy. Left ventricular diastolic parameters are consistent with Grade I diastolic dysfunction (impaired relaxation). Right Ventricle: The right ventricular size is normal. No increase in right ventricular wall thickness. Right ventricular systolic function is normal. Left Atrium: Left atrial size was severely dilated. Right Atrium: Right atrial size was mild to moderately dilated. Pericardium: There is no evidence of pericardial effusion. Presence of pericardial fat pad. Mitral Valve: The mitral valve is normal in structure. There is moderate thickening of the mitral valve leaflet(s). There is mild calcification of the mitral valve leaflet(s). Moderate mitral annular calcification. Trivial mitral valve regurgitation. No evidence of mitral valve stenosis. Tricuspid Valve: The tricuspid valve is normal in structure. Tricuspid valve regurgitation is trivial. No evidence of tricuspid stenosis. Aortic Valve: The aortic valve is calcified. There is severe calcifcation of the aortic valve. There is severe thickening of the aortic valve. There is moderate aortic valve annular calcification. Aortic valve regurgitation is moderate. Aortic regurgitation PHT measures 365 msec. Severe aortic stenosis is present. Aortic valve mean gradient measures 42.0 mmHg. Aortic valve peak gradient measures 69.6 mmHg. Aortic valve area, by VTI measures 0.63 cm. Pulmonic Valve: The pulmonic valve was grossly normal. Pulmonic valve regurgitation is moderate. No evidence of pulmonic stenosis. Aorta: The aortic root and ascending aorta are structurally normal, with no evidence of dilitation. Venous: The inferior vena cava is normal in size with greater than 50% respiratory variability, suggesting right atrial pressure of 3 mmHg. IAS/Shunts: Possible left to right shunt cannot be excluded.   LEFT VENTRICLE PLAX 2D LVIDd:         4.95 cm  Diastology LVIDs:         4.27 cm  LV e' medial:    3.48 cm/s LV PW:         1.38 cm  LV E/e' medial:  17.7 LV IVS:        1.42 cm  LV e' lateral:   5.55 cm/s LVOT diam:     2.20 cm  LV E/e' lateral: 11.1 LV SV:         72 LV SV Index:   31 LVOT Area:     3.80 cm  RIGHT VENTRICLE            IVC RV Basal  diam:  3.28 cm    IVC diam: 1.69 cm RV Mid diam:    2.06 cm RV S prime:     7.94 cm/s TAPSE (M-mode): 1.8 cm LEFT ATRIUM              Index       RIGHT ATRIUM           Index LA diam:        4.80 cm  2.04 cm/m  RA Area:     20.80 cm LA Vol (A2C):   105.0 ml 44.67 ml/m RA Volume:   67.90 ml  28.88 ml/m LA Vol (A4C):   172.0 ml 73.17 ml/m LA Biplane Vol: 148.0 ml 62.96 ml/m  AORTIC VALVE AV Area (Vmax):    0.65 cm AV Area (Vmean):   0.63 cm AV Area (VTI):     0.63 cm AV Vmax:           417.00 cm/s AV Vmean:          301.000 cm/s AV VTI:            1.140 m AV Peak Grad:      69.6 mmHg AV Mean Grad:      42.0 mmHg LVOT Vmax:         71.70 cm/s LVOT Vmean:        49.800 cm/s LVOT VTI:          0.189 m LVOT/AV VTI ratio: 0.17 AI PHT:            365 msec  AORTA Ao Root diam: 3.20 cm Ao Asc diam:  3.40 cm MITRAL VALVE MV Area (PHT): 2.62 cm    SHUNTS MV Decel Time: 289 msec    Systemic VTI:  0.19 m MV E velocity: 61.60 cm/s  Systemic Diam: 2.20 cm MV A velocity: 77.10 cm/s MV E/A ratio:  0.80 Buford Dresser MD Electronically signed by Buford Dresser MD Signature Date/Time: 08/22/2020/3:37:03 PM    Final      STS Risk Calculator:  Procedure: AVR + CAB Risk of Mortality: 2.957% Renal Failure: 3.003% Permanent Stroke: 1.935% Prolonged Ventilation: 12.247% DSW Infection: 0.129% Reoperation: 5.642% Morbidity or Mortality: 19.090% Short Length of Stay: 26.325% Long Length of Stay: 6.951%   Assessment and Plan:   Anthony Hartman is a 79 y.o. male with symptoms of severe, stage D1 aortic stenosis with NYHA Class IV symptoms currently  admitted with NSTEMI. Echo 10/11 showed EF 50-55%, mod concentric LVH, mild hypokinesis of LV, basal anterior wasll, anterolateral wall and inferolateral wall, severe LAE, mild-mod RAE, moderate MAC, severe AS with mean gradient of 42 mm hg, peak gradient 69.6 mm hg, AVA 0.65 cm2, DVI 0.17, moderate AI and moderate PR. L/RHC 10/12 showed severe mid LAD stenosis at the takeoff of the large Diagonal branch (LAD lesion is flow limiting by pressure wire (DFR 0.84)). The Diagonal ostial stenosis has recurred following balloon angioplasty in June 2020. He has had a functional occlusion of an obtuse marginal branch for years with prior failed PCI of this branch. It was felt he would need surgical AVR with bypass vs TAVR and high risk PCI. The intervention in the LAD and Diagonal would be difficult and there is a chance we would not have an optimal result with bifurcation stenting or primary stenting of the LAD with angioplasty alone of the Diagonal.   I have reviewed the natural history of aortic stenosis with the patient. We have discussed the limitations of  medical therapy and the poor prognosis associated with symptomatic aortic stenosis. We have reviewed potential treatment options, including palliative medical therapy, conventional surgical aortic valve replacement, and transcatheter aortic valve replacement. We discussed treatment options in the context of this patient's specific comorbid medical conditions.    The patient's predicted risk of mortality with conventional aortic valve replacement is 2.957% primarily based on age, previous cancer, multivessel CAD. The patient is very active at baseline with good functional capacity. He will be best served with CAB + AVR which is tentatively scheduled for 10/15 with Dr. Cyndia Bent pending Plavix washout. Plan to check P2Y12 tomorrow to assess platelet inhibition.   Dr. Cyndia Bent to follow.  Signed, Angelena Form, PA-C  08/24/2020 8:01 AM    I have personally  reviewed the medical record, 2D echocardiogram, cardiac catheterization, and interviewed and examined the patient.  I agree with the above assessment.  He has stage D, severe, symptomatic aortic stenosis with New York Heart Association class IV symptoms of chest pain at rest and ruled in for non-ST segment elevation MI.  His 2D echocardiogram shows a severely calcified aortic valve with restricted leaflet mobility and a mean gradient of 46 mmHg consistent with severe aortic stenosis.  There is also moderate aortic insufficiency.  Left ventricular ejection fraction is 50 to 55% with moderate LVH.  His cardiac catheterization shows severe three-vessel coronary disease.  I think that coronary bypass graft surgery and aortic valve replacement is the best option for this patient. I discussed the operative procedure with the patient and his wife including alternatives, benefits and risks; including but not limited to bleeding, blood transfusion, infection, stroke, myocardial infarction, graft failure, heart block requiring a permanent pacemaker, organ dysfunction, and death.  [name] understands and agrees to proceed.  His Plavix has been stopped.  We will check a P2Y12 level tomorrow and tentatively plan to do surgery on Friday.  I spent 60 minutes performing this consultation and > 50% of this time was spent face to face counseling and coordinating the care of this patient's severe symptomatic aortic stenosis and severe three-vessel coronary disease.

## 2020-08-24 NOTE — Progress Notes (Signed)
Pt c/o constipation. Maalox ordered and given last night via standing orders.  Prune juice given this morning.

## 2020-08-25 ENCOUNTER — Inpatient Hospital Stay (HOSPITAL_COMMUNITY): Payer: PPO

## 2020-08-25 DIAGNOSIS — I350 Nonrheumatic aortic (valve) stenosis: Secondary | ICD-10-CM | POA: Diagnosis not present

## 2020-08-25 DIAGNOSIS — I200 Unstable angina: Secondary | ICD-10-CM | POA: Diagnosis not present

## 2020-08-25 LAB — CBC
HCT: 51.4 % (ref 39.0–52.0)
Hemoglobin: 17.2 g/dL — ABNORMAL HIGH (ref 13.0–17.0)
MCH: 33 pg (ref 26.0–34.0)
MCHC: 33.5 g/dL (ref 30.0–36.0)
MCV: 98.5 fL (ref 80.0–100.0)
Platelets: 167 10*3/uL (ref 150–400)
RBC: 5.22 MIL/uL (ref 4.22–5.81)
RDW: 12.8 % (ref 11.5–15.5)
WBC: 6.2 10*3/uL (ref 4.0–10.5)
nRBC: 0 % (ref 0.0–0.2)

## 2020-08-25 LAB — PULMONARY FUNCTION TEST
FEF 25-75 Pre: 1.85 L/sec
FEF2575-%Pred-Pre: 70 %
FEV1-%Pred-Pre: 82 %
FEV1-Pre: 3.04 L
FEV1FVC-%Pred-Pre: 95 %
FEV6-%Pred-Pre: 88 %
FEV6-Pre: 4.29 L
FEV6FVC-%Pred-Pre: 102 %
FVC-%Pred-Pre: 86 %
FVC-Pre: 4.43 L
Pre FEV1/FVC ratio: 69 %
Pre FEV6/FVC Ratio: 97 %

## 2020-08-25 LAB — TYPE AND SCREEN
ABO/RH(D): O POS
Antibody Screen: NEGATIVE

## 2020-08-25 LAB — SURGICAL PCR SCREEN
MRSA, PCR: NEGATIVE
Staphylococcus aureus: NEGATIVE

## 2020-08-25 LAB — HEPARIN LEVEL (UNFRACTIONATED): Heparin Unfractionated: 0.47 IU/mL (ref 0.30–0.70)

## 2020-08-25 LAB — PLATELET INHIBITION P2Y12: Platelet Function  P2Y12: 172 [PRU] — ABNORMAL LOW (ref 182–335)

## 2020-08-25 MED ORDER — NITROGLYCERIN IN D5W 200-5 MCG/ML-% IV SOLN
2.0000 ug/min | INTRAVENOUS | Status: DC
  Filled 2020-08-25: qty 250

## 2020-08-25 MED ORDER — EPINEPHRINE HCL 5 MG/250ML IV SOLN IN NS
0.0000 ug/min | INTRAVENOUS | Status: DC
  Filled 2020-08-25: qty 250

## 2020-08-25 MED ORDER — PHENYLEPHRINE HCL-NACL 20-0.9 MG/250ML-% IV SOLN
30.0000 ug/min | INTRAVENOUS | Status: AC
  Administered 2020-08-26: 50 ug/min via INTRAVENOUS
  Filled 2020-08-25: qty 250

## 2020-08-25 MED ORDER — TEMAZEPAM 15 MG PO CAPS: 15.0000 mg | ORAL_CAPSULE | Freq: Once | ORAL | Status: DC | PRN

## 2020-08-25 MED ORDER — TRANEXAMIC ACID (OHS) PUMP PRIME SOLUTION
2.0000 mg/kg | INTRAVENOUS | Status: DC
  Filled 2020-08-25: qty 2.08

## 2020-08-25 MED ORDER — NOREPINEPHRINE 4 MG/250ML-% IV SOLN
0.0000 ug/min | INTRAVENOUS | Status: DC
  Administered 2020-08-26: 8 ug/min via INTRAVENOUS
  Filled 2020-08-25: qty 250

## 2020-08-25 MED ORDER — POTASSIUM CHLORIDE 2 MEQ/ML IV SOLN
80.0000 meq | INTRAVENOUS | Status: DC
  Filled 2020-08-25: qty 40

## 2020-08-25 MED ORDER — MILRINONE LACTATE IN DEXTROSE 20-5 MG/100ML-% IV SOLN
0.3000 ug/kg/min | INTRAVENOUS | Status: DC
  Filled 2020-08-25: qty 100

## 2020-08-25 MED ORDER — VANCOMYCIN HCL 1500 MG/300ML IV SOLN
1500.0000 mg | INTRAVENOUS | Status: AC
  Administered 2020-08-26: 1500 mg via INTRAVENOUS
  Filled 2020-08-25: qty 300

## 2020-08-25 MED ORDER — SODIUM CHLORIDE 0.9 % IV SOLN
750.0000 mg | INTRAVENOUS | Status: DC
  Filled 2020-08-25: qty 750

## 2020-08-25 MED ORDER — INSULIN REGULAR(HUMAN) IN NACL 100-0.9 UT/100ML-% IV SOLN
INTRAVENOUS | Status: AC
  Administered 2020-08-26: .8 [IU]/h via INTRAVENOUS
  Filled 2020-08-25: qty 100

## 2020-08-25 MED ORDER — DIAZEPAM 5 MG PO TABS
5.0000 mg | ORAL_TABLET | Freq: Once | ORAL | Status: AC
  Administered 2020-08-26: 5 mg via ORAL
  Filled 2020-08-25: qty 1

## 2020-08-25 MED ORDER — METOPROLOL TARTRATE 12.5 MG HALF TABLET
12.5000 mg | ORAL_TABLET | Freq: Once | ORAL | Status: AC
  Administered 2020-08-26: 12.5 mg via ORAL
  Filled 2020-08-25: qty 1

## 2020-08-25 MED ORDER — TRANEXAMIC ACID 1000 MG/10ML IV SOLN
1.5000 mg/kg/h | INTRAVENOUS | Status: AC
  Administered 2020-08-26: 1.5 mg/kg/h via INTRAVENOUS
  Filled 2020-08-25: qty 25

## 2020-08-25 MED ORDER — CHLORHEXIDINE GLUCONATE CLOTH 2 % EX PADS
6.0000 | MEDICATED_PAD | Freq: Once | CUTANEOUS | Status: AC
  Administered 2020-08-25: 6 via TOPICAL

## 2020-08-25 MED ORDER — TRANEXAMIC ACID (OHS) BOLUS VIA INFUSION
15.0000 mg/kg | INTRAVENOUS | Status: AC
  Administered 2020-08-26: 1557 mg via INTRAVENOUS
  Filled 2020-08-25: qty 1557

## 2020-08-25 MED ORDER — CHLORHEXIDINE GLUCONATE 0.12 % MT SOLN
15.0000 mL | Freq: Once | OROMUCOSAL | Status: AC
  Administered 2020-08-26: 15 mL via OROMUCOSAL
  Filled 2020-08-25: qty 15

## 2020-08-25 MED ORDER — DEXMEDETOMIDINE HCL IN NACL 400 MCG/100ML IV SOLN
0.1000 ug/kg/h | INTRAVENOUS | Status: AC
  Administered 2020-08-26: .7 ug/kg/h via INTRAVENOUS
  Filled 2020-08-25: qty 100

## 2020-08-25 MED ORDER — PLASMA-LYTE 148 IV SOLN
INTRAVENOUS | Status: DC
  Filled 2020-08-25: qty 2.5

## 2020-08-25 MED ORDER — MAGNESIUM SULFATE 50 % IJ SOLN
40.0000 meq | INTRAMUSCULAR | Status: DC
  Filled 2020-08-25: qty 9.85

## 2020-08-25 MED ORDER — SODIUM CHLORIDE 0.9 % IV SOLN
INTRAVENOUS | Status: DC
  Filled 2020-08-25: qty 30

## 2020-08-25 MED ORDER — LEVOFLOXACIN IN D5W 500 MG/100ML IV SOLN
500.0000 mg | INTRAVENOUS | Status: AC
  Administered 2020-08-26: 500 mg via INTRAVENOUS
  Filled 2020-08-25: qty 100

## 2020-08-25 MED ORDER — SODIUM CHLORIDE 0.9 % IV SOLN
1.5000 g | INTRAVENOUS | Status: DC
  Filled 2020-08-25: qty 1.5

## 2020-08-25 MED ORDER — BISACODYL 5 MG PO TBEC
5.0000 mg | DELAYED_RELEASE_TABLET | Freq: Once | ORAL | Status: AC
  Administered 2020-08-25: 5 mg via ORAL
  Filled 2020-08-25: qty 1

## 2020-08-25 NOTE — Progress Notes (Signed)
°   08/25/20 0830  Clinical Encounter Type  Visited With Patient  Visit Type Follow-up  Referral From Patient  Consult/Referral To Chaplain  Spiritual Encounters  Spiritual Needs Emotional  The chaplain went to speak to the patient to let him know Chaplain Ulis Rias would complete his AD at 11:00am. The patient and chaplain discussed his heart procedure and his faith. The patient discussed the need for him to have his affairs for his family and the importance of making sure they understand his wishes both medically and personally.  The chaplain provided active listening, emotional support, and spiritual care.

## 2020-08-25 NOTE — Progress Notes (Signed)
[address] for Heparin Indication: chest pain/ACS  Allergies  Allergen Reactions  . Statins     MD ORDERS UNSPECIFIED REACTION    . Augmentin [Amoxicillin-Pot Clavulanate] Nausea And Vomiting     Has patient had a PCN reaction causing immediate rash, facial/tongue/throat swelling, SOB or lightheadedness with hypotension: No Has patient had a PCN reaction causing severe rash involving mucus membranes or skin necrosis: No Has patient had a PCN reaction that required hospitalization No Has patient had a PCN reaction occurring within the last 10 years: No If all of the above answers are "NO", then may proceed with Cephalosporin use.   Marland Kitchen Dexamethasone Other (See Comments)    Frequent Urination [? HYPERGLYCEMIA? ]  . Sulfa Antibiotics Nausea And Vomiting    Patient Measurements: Height: 6\' 4"  (193 cm) Weight: 103.8 kg (228 lb 14.4 oz) IBW/kg (Calculated) : 86.8 Heparin Dosing Weight: 100 kg  Vital Signs: Temp: 97.8 F (36.6 C) (10/14 0514) Temp Source: Oral (10/14 0514) BP: 149/76 (10/14 0926) Pulse Rate: 72 (10/14 0926)  Labs: Recent Labs    08/22/20 1542 08/22/20 2209 08/23/20 0242 08/23/20 0243 08/23/20 1121 08/23/20 1123 08/23/20 1123 08/24/20 0232 08/24/20 0735 08/25/20 0421  HGB  --   --  17.3*  --    < > 17.3*   < > 16.8  --  17.2*  HCT  --   --  52.0  --    < > 51.0  --  50.5  --  51.4  PLT  --   --  186  --   --   --   --  169  --  167  HEPARINUNFRC  --    < >  --  0.39  --   --   --   --  0.28* 0.47  CREATININE  --   --  1.31*  --   --   --   --  1.35*  --   --   TROPONINIHS 1,625*  --   --   --   --   --   --   --   --   --    < > = values in this interval not displayed.    Estimated Creatinine Clearance: 54.5 mL/min (A) (by C-G formula based on SCr of 1.35 mg/dL (H)).   Medical History: Past Medical History:  Diagnosis Date  . Anxiety   . Arthritis   . Bladder cancer ([place])   . Bladder stone   . BPH (benign  prostatic hypertrophy)   . Depression   . GERD (gastroesophageal reflux disease)   . History of kidney stones   . History of myocardial infarction    07-17-2011--   MI W/ OCCLUDED OM1. 2017 occluded RCA treated with a bare-metal stent, high-grade ramus intermediate stenosis in 2018 with failed attempted PCI  . Hyperlipidemia   . Hypertension   . Myocardial infarction [facility]) 2012; 2017   2012 - noted Subtotal CTO of OM/RI;  09/2016 - Acute Infer STEMI - BMS x 2 mRCA (3.5 mm x 15 & 3.5 x 12 - tapered 4.0 - 3.8 mm)  . OSA (obstructive sleep apnea)    cpap non-compliant   . Prostate cancer ([place])     Medications:  No current facility-administered medications on file prior to encounter.   Current Outpatient Medications on File Prior to Encounter  Medication Sig Dispense Refill  . acetaminophen (TYLENOL) 500 MG tablet Take 500 mg by mouth every  6 (six) hours as needed (pain.).    Marland Kitchen ALPRAZolam (XANAX) 1 MG tablet Take 0.5 mg by mouth at bedtime.    . Bempedoic Acid (NEXLETOL) 180 MG TABS Take 180 mg by mouth daily. 90 tablet 1  . buPROPion (WELLBUTRIN XL) 300 MG 24 hr tablet Take 300 mg by mouth daily.    . carvedilol (COREG) 6.25 MG tablet Take 1 tablet (6.25 mg total) by mouth 2 (two) times daily with a meal. 180 tablet 3  . clopidogrel (PLAVIX) 75 MG tablet TAKE 1 TABLET BY MOUTH EVERY DAY (Patient taking differently: Take 75 mg by mouth daily. ) 90 tablet 2  . Evolocumab (REPATHA SURECLICK) 295 MG/ML SOAJ Inject 140 mg into the skin every 14 (fourteen) days. 2 pen 11  . famotidine (PEPCID) 20 MG tablet Take 20 mg by mouth 2 (two) times daily as needed (acid reflux/indigestion.).     Marland Kitchen isosorbide mononitrate (IMDUR) 60 MG 24 hr tablet TAKE 1 TABLET BY MOUTH ONCE (1) DAILY (Patient taking differently: Take 60 mg by mouth daily. ) 90 tablet 2  . losartan (COZAAR) 25 MG tablet Take 1 tablet (25 mg total) by mouth daily. 90 tablet 2  . meloxicam (MOBIC) 7.5 MG tablet Take 1 tablet (7.5 mg total)  by mouth daily as needed for pain. 30 tablet 3  . Multiple Vitamin (MULTIVITAMIN WITH MINERALS) TABS tablet Take 1 tablet by mouth daily.    . nitroGLYCERIN (NITROSTAT) 0.4 MG SL tablet Place 1 tablet (0.4 mg total) under the tongue every 5 (five) minutes as needed for chest pain. 25 tablet 6  . Polyethyl Glycol-Propyl Glycol (LUBRICANT EYE DROPS) 0.4-0.3 % SOLN Place 1 drop into both eyes 3 (three) times daily as needed (dry/irritated eyes).    Marland Kitchen testosterone cypionate (DEPOTESTOSTERONE CYPIONATE) 200 MG/ML injection Inject 120 mg into the muscle every 14 (fourteen) days.     . valACYclovir (VALTREX) 1000 MG tablet Take 1,000 mg by mouth 3 (three) times daily as needed (fever blisters).     . cefUROXime (CEFTIN) 500 MG tablet Take 500 mg by mouth 2 (two) times daily. (Patient not taking: Reported on 08/22/2020)    . doxycycline (ADOXA) 100 MG tablet  (Patient not taking: Reported on 08/22/2020)    . HYDROcodone-acetaminophen (NORCO/VICODIN) 5-325 MG tablet Take 1 tablet by mouth 3 (three) times daily as needed. (Patient not taking: Reported on 07/26/2020)       Assessment: 58 yoM with hx CAD and severe AS admitted with CP and started on IV heparin. Pt s/p LHC and heparin resumed with possible staged PCI as TAVR workup continues.  Heparin level therapeutic at 0.47, CBC stable, planning for AVR + CABG tomorrow.  Goal of Therapy:  Heparin level 0.3-0.7 units/ml Monitor platelets by anticoagulation protocol: Yes   Plan:  -Continue heparin 1500 units/h -Daily heparin level and CBC    Arrie Senate, PharmD, BCPS Clinical Pharmacist [phone] Please check AMION for all [facility] Pharmacy numbers 08/25/2020

## 2020-08-25 NOTE — Progress Notes (Signed)
CARDIAC REHAB PHASE I   PRE:  Rate/Rhythm: 77 sr  BP:  Sitting: 156/94      SaO2: 98 RA   Offered to walk with pt, pt agreeable, then wanted to wait for his wife. Pt then decided he was just going to walk in room since he got SOB and tired after walking in hallway yesterday. Stressed importance of not over doing it. Encouraged sitting in chair and IS use. Pt denies further questions or concerns regarding surgery. Helped pt and wife print out will. Chaplain to see later. Will continue to follow.  [phone] Rufina Falco, RN BSN 08/25/2020 9:01 AM

## 2020-08-25 NOTE — Progress Notes (Signed)
Progress Note  Patient Name: Anthony Hartman Date of Encounter: 08/25/2020  [facility] Cardiologist: Minus Breeding, MD   Subjective   No chest pain  Inpatient Medications    Scheduled Meds: . ALPRAZolam  0.5 mg Oral QHS  . aspirin EC  81 mg Oral Daily  . buPROPion  300 mg Oral Daily  . carvedilol  6.25 mg Oral BID WC  . chlorhexidine  15 mL Mouth Rinse BID  . [START ON 08/26/2020] epinephrine  0-10 mcg/min Intravenous To OR  . feeding supplement  237 mL Oral BID BM  . [START ON 08/26/2020] heparin-papaverine-plasmalyte irrigation   Irrigation To OR  . [START ON 08/26/2020] insulin   Intravenous To OR  . isosorbide mononitrate  60 mg Oral Daily  . [START ON 08/26/2020] magnesium sulfate  40 mEq Other To OR  . mouth rinse  15 mL Mouth Rinse q12n4p  . multivitamin with minerals  1 tablet Oral Daily  . [START ON 08/26/2020] phenylephrine  30-200 mcg/min Intravenous To OR  . [START ON 08/26/2020] potassium chloride  80 mEq Other To OR  . sodium chloride flush  3 mL Intravenous Q12H  . sodium chloride flush  3 mL Intravenous Q12H  . sodium chloride flush  3 mL Intravenous Q12H  . [START ON 08/26/2020] tranexamic acid  15 mg/kg Intravenous To OR  . [START ON 08/26/2020] tranexamic acid  2 mg/kg Intracatheter To OR   Continuous Infusions: . sodium chloride    . [START ON 08/26/2020] cefUROXime (ZINACEF)  IV    . [START ON 08/26/2020] cefUROXime (ZINACEF)  IV    . [START ON 08/26/2020] dexmedetomidine    . [START ON 08/26/2020] heparin 30,000 units/NS 1000 mL solution for CELLSAVER    . heparin 1,500 Units/hr (08/25/20 0159)  . [START ON 08/26/2020] milrinone    . [START ON 08/26/2020] nitroGLYCERIN    . [START ON 08/26/2020] norepinephrine    . [START ON 08/26/2020] tranexamic acid (CYKLOKAPRON) infusion ([facility])    . [START ON 08/26/2020] vancomycin     PRN Meds: sodium chloride, famotidine, magnesium hydroxide, nitroGLYCERIN, ondansetron (ZOFRAN) IV, oxyCODONE,  polyvinyl alcohol, sodium chloride flush, traMADol   Vital Signs    Vitals:   08/24/20 1125 08/24/20 1622 08/24/20 2053 08/25/20 0514  BP: 119/62 (!) 149/79 (!) 144/72 (!) 146/90  Pulse: 60 67 68 62  Resp: 16 18 20 17   Temp: 98.1 F (36.7 C) (!) 97.4 F (36.3 C) 97.7 F (36.5 C) 97.8 F (36.6 C)  TempSrc: Oral Oral Oral Oral  SpO2: 95% 97% 98% 100%  Weight:    103.8 kg  Height:        Intake/Output Summary (Last 24 hours) at 08/25/2020 0858 Last data filed at 08/25/2020 0400 Gross per 24 hour  Intake 929.17 ml  Output 1875 ml  Net -945.83 ml   Last 3 Weights 08/25/2020 08/24/2020 08/23/2020  Weight (lbs) 228 lb 14.4 oz 233 lb 12.8 oz 234 lb 12.8 oz  Weight (kg) 103.828 kg 106.051 kg 106.505 kg      Telemetry    Sinus- Personally Reviewed  ECG    No AM EKG- Personally Reviewed  Physical Exam   General: Well developed, well nourished, NAD  HEENT: OP clear, mucus membranes moist  SKIN: warm, dry. No rashes. Neuro: No focal deficits  Musculoskeletal: Muscle strength 5/5 all ext  Psychiatric: Mood and affect normal  Neck: No JVD, no carotid bruits, no thyromegaly, no lymphadenopathy.  Lungs:Clear bilaterally, no wheezes, rhonci,  crackles Cardiovascular: Regular rate and rhythm. Systolic murmur.  Abdomen:Soft. Bowel sounds present. Non-tender.  Extremities: No lower extremity edema. Pulses are 2 + in the bilateral DP/PT.   Labs    High Sensitivity Troponin:   Recent Labs  Lab 08/22/20 0542 08/22/20 0921 08/22/20 1542  TROPONINIHS 247* 736* 1,625*      Chemistry Recent Labs  Lab 08/22/20 0542 08/22/20 0542 08/22/20 0619 08/22/20 0619 08/23/20 0242 08/23/20 0242 08/23/20 1121 08/23/20 1123 08/24/20 0232  NA 136   < > 136   < > 137   < > 139 139 135  K 4.1   < > 4.1   < > 4.0   < > 4.2 4.1 4.1  CL 102   < > 101  --  103  --   --   --  103  CO2 24  --   --   --  24  --   --   --  23  GLUCOSE 103*   < > 100*  --  76  --   --   --  93  BUN 19   <  > 24*  --  14  --   --   --  13  CREATININE 1.51*   < > 1.40*  --  1.31*  --   --   --  1.35*  CALCIUM 9.2  --   --   --  8.6*  --   --   --  8.5*  PROT 5.5*  --   --   --   --   --   --   --   --   ALBUMIN 3.6  --   --   --   --   --   --   --   --   AST 30  --   --   --   --   --   --   --   --   ALT 24  --   --   --   --   --   --   --   --   ALKPHOS 35*  --   --   --   --   --   --   --   --   BILITOT 0.9  --   --   --   --   --   --   --   --   GFRNONAA 43*  --   --   --  51*  --   --   --  50*  ANIONGAP 10  --   --   --  10  --   --   --  9   < > = values in this interval not displayed.     Hematology Recent Labs  Lab 08/23/20 0242 08/23/20 1121 08/23/20 1123 08/24/20 0232 08/25/20 0421  WBC 7.1  --   --  7.0 6.2  RBC 5.29  --   --  5.18 5.22  HGB 17.3*   < > 17.3* 16.8 17.2*  HCT 52.0   < > 51.0 50.5 51.4  MCV 98.3  --   --  97.5 98.5  MCH 32.7  --   --  32.4 33.0  MCHC 33.3  --   --  33.3 33.5  RDW 12.6  --   --  12.7 12.8  PLT 186  --   --  169 167   < > = values in this  interval not displayed.    BNPNo results for input(s): BNP, PROBNP in the last 168 hours.   DDimer No results for input(s): DDIMER in the last 168 hours.   Radiology    CARDIAC CATHETERIZATION  Result Date: 08/23/2020  Severe aortic stenosis with recent progression in symptoms causing significant limitation in physical activity.  Unable to cross the aortic valve for hemodynamic assessment.  [place] 111 mid vessel bifurcation stenosis.  The previously dilated diagonal branch has restenosed to greater than 90%.  The previously untreated LAD has DFR of 0.84.  Obtuse marginal #1 with 99% stenosis with competitive flow.  This artery functions as a total occlusion and has had 2 failed PCI attempts in the past.  Left main is widely patent  RCA is dominant, tortuous, but does not have any focal high-grade stenosis.  There is diffuse disease throughout the vessel up to 40 to 50%.  Normal pulmonary  artery pressures.  Pulmonary capillary wedge mean pressure is 9 mmHg. RECOMMENDATIONS:  Symptomatic aortic stenosis and angina due to significant obtuse marginal and LAD diagonal disease.  Heart valve team consideration of complex PCI followed by TAVR versus aortic valve replacement along with coronary bypass grafting to the LAD, diagonal, and first obtuse marginal.  VAS US DOPPLER PRE CABG  Result Date: 08/24/2020 PREOPERATIVE VASCULAR EVALUATION  Indications:  Pre-CABG. Risk Factors: Hypertension, hyperlipidemia. Performing Technologist: Abram Sander RVS  Examination Guidelines: A complete evaluation includes B-mode imaging, spectral Doppler, color Doppler, and power Doppler as needed of all accessible portions of each vessel. Bilateral testing is considered an integral part of a complete examination. Limited examinations for reoccurring indications may be performed as noted.  Right Carotid Findings: +----------+--------+--------+--------+------------+--------+           PSV cm/sEDV cm/sStenosisDescribe    Comments +----------+--------+--------+--------+------------+--------+ CCA Prox  53      10              heterogenous         +----------+--------+--------+--------+------------+--------+ CCA Distal49      11              heterogenous         +----------+--------+--------+--------+------------+--------+ ICA Prox  44      14      1-39%   heterogenous         +----------+--------+--------+--------+------------+--------+ ICA Distal48      11                                   +----------+--------+--------+--------+------------+--------+ ECA       86      6                                    +----------+--------+--------+--------+------------+--------+ Portions of this table do not appear on this page. +----------+--------+-------+--------+------------+           PSV cm/sEDV cmsDescribeArm Pressure +----------+--------+-------+--------+------------+  Subclavian103                                 +----------+--------+-------+--------+------------+ +---------+--------+--------+--------------+ VertebralPSV cm/sEDV cm/sNot identified +---------+--------+--------+--------------+ Left Carotid Findings: +----------+--------+--------+--------+------------+--------+           PSV cm/sEDV cm/sStenosisDescribe    Comments +----------+--------+--------+--------+------------+--------+ CCA Prox  50      12  heterogenous         +----------+--------+--------+--------+------------+--------+ CCA Distal43      9               heterogenous         +----------+--------+--------+--------+------------+--------+ ICA Prox  39      14      1-39%   heterogenous         +----------+--------+--------+--------+------------+--------+ ICA Distal53      16                                   +----------+--------+--------+--------+------------+--------+ ECA       75      7                                    +----------+--------+--------+--------+------------+--------+ +----------+--------+--------+--------+------------+ SubclavianPSV cm/sEDV cm/sDescribeArm Pressure +----------+--------+--------+--------+------------+           61                                   +----------+--------+--------+--------+------------+ +---------+--------+--+--------+--+---------+ VertebralPSV cm/s36EDV cm/s11Antegrade +---------+--------+--+--------+--+---------+  ABI Findings: +--------+------------------+-----+---------+--------+ Right   Rt Pressure (mmHg)IndexWaveform Comment  +--------+------------------+-----+---------+--------+ [id number]                    triphasic         +--------+------------------+-----+---------+--------+ ATA                            triphasic         +--------+------------------+-----+---------+--------+ PTA                            triphasic          +--------+------------------+-----+---------+--------+ +--------+------------------+-----+---------+-------+ Left    Lt Pressure (mmHg)IndexWaveform Comment +--------+------------------+-----+---------+-------+ Brachial                       triphasic        +--------+------------------+-----+---------+-------+ ATA                            triphasic        +--------+------------------+-----+---------+-------+ PTA                            triphasic        +--------+------------------+-----+---------+-------+  Right Doppler Findings: +--------+--------+-----+---------+--------+ Site    PressureIndexDoppler  Comments +--------+--------+-----+---------+--------+ [id number]          triphasic         +--------+--------+-----+---------+--------+ Radial               triphasic         +--------+--------+-----+---------+--------+ Ulnar                triphasic         +--------+--------+-----+---------+--------+  Left Doppler Findings: +--------+--------+-----+---------+--------+ Site    PressureIndexDoppler  Comments +--------+--------+-----+---------+--------+ Brachial             triphasic         +--------+--------+-----+---------+--------+ Radial               triphasic         +--------+--------+-----+---------+--------+  Ulnar                triphasic         +--------+--------+-----+---------+--------+  Summary: Right Carotid: Velocities in the right ICA are consistent with a 1-39% stenosis. Left Carotid: Velocities in the left ICA are consistent with a 1-39% stenosis. Vertebrals: Left vertebral artery demonstrates antegrade flow. Right vertebral             artery was not visualized. Right Upper Extremity: Doppler waveforms remain within normal limits with right radial compression. Doppler waveforms decrease 50% with right ulnar compression. Left Upper Extremity: Doppler waveforms decrease 50% with left radial compression. Doppler waveforms  remain within normal limits with left ulnar compression.  Electronically signed by Curt Jews MD on 08/24/2020 at 3:09:07 PM.    Final     Cardiac Studies    Patient Profile     79 y.o. male with a PMH of CAD (first MI in 2012 with unsuccessful PCI attempt to OM1, s/p BMS to RCA in 2017, with subsequent PCI to 2nd diagonal 04/2019), severe AS, HTN, HLD, OSA, GERD, BPH, and bladder cancer, who presented with chest pain and found to have a NSTEMI. Cardiac cath with severe mid LAD stenosis at the takeoff of the large Diagonal branch. The LAD lesion is flow limiting by pressure wire (DFR 0.84). The Diagonal ostial stenosis has recurred following balloon angioplasty in June 2020. He has had a functional occlusion of an obtuse marginal branch for years with prior failed PCI of this branch.   Assessment & Plan    1. CAD with unstable angina/NSTEMI:  2. Severe aortic stenosis  Planning for bypass and AVR tomorrow. He is doing well this am. Will continue ASA, beta blocker and Imdur. Continue IV heparin. He is statin intolerant. He is on a Repatha.    For questions or updates, please contact [place] Please consult [email or web address] for contact info under        Signed, Lauree Chandler, MD  08/25/2020, 8:58 AM

## 2020-08-25 NOTE — Progress Notes (Signed)
PCR completed; Consent completed.

## 2020-08-25 NOTE — Progress Notes (Signed)
2 Days Post-Op Procedure(s) (LRB): RIGHT/LEFT HEART CATH AND CORONARY ANGIOGRAPHY (N/A) INTRAVASCULAR PRESSURE WIRE/FFR STUDY (N/A) Subjective: No complaints  Objective: Vital signs in last 24 hours: Temp:  [97.7 F (36.5 C)-98.1 F (36.7 C)] 98.1 F (36.7 C) (10/14 1154) Pulse Rate:  [62-72] 65 (10/14 1154) Cardiac Rhythm: Normal sinus rhythm (10/14 0700) Resp:  [17-20] 19 (10/14 1154) BP: (127-149)/(72-90) 127/76 (10/14 1154) SpO2:  [97 %-100 %] 97 % (10/14 1154) Weight:  [103.8 kg] 103.8 kg (10/14 0514)  Hemodynamic parameters for last 24 hours:    Intake/Output from previous day: 10/13 0701 - 10/14 0700 In: 929.2 [P.O.:600; I.V.:329.2] Out: 1875 [Urine:1875] Intake/Output this shift: Total I/O In: 240 [P.O.:240] Out: 500 [Urine:500]  General appearance: alert and cooperative Heart: regular rate and rhythm and systolic murmur Lungs: clear to auscultation bilaterally  Lab Results: Recent Labs    08/24/20 0232 08/25/20 0421  WBC 7.0 6.2  HGB 16.8 17.2*  HCT 50.5 51.4  PLT 169 167   BMET:  Recent Labs    08/23/20 0242 08/23/20 1121 08/23/20 1123 08/24/20 0232  NA 137   < > 139 135  K 4.0   < > 4.1 4.1  CL 103  --   --  103  CO2 24  --   --  23  GLUCOSE 76  --   --  93  BUN 14  --   --  13  CREATININE 1.31*  --   --  1.35*  CALCIUM 8.6*  --   --  8.5*   < > = values in this interval not displayed.    PT/INR: No results for input(s): LABPROT, INR in the last 72 hours. ABG    Component Value Date/Time   PHART 7.372 08/23/2020 1123   HCO3 26.1 08/23/2020 1123   TCO2 27 08/23/2020 1123   O2SAT 96.0 08/23/2020 1123   CBG (last 3)  No results for input(s): GLUCAP in the last 72 hours.  Assessment/Plan:  Severe aortic stenosis and multivessel coronary artery disease. P2Y12 172 this am. Should be adequate to proceed with surgery in am.Plan CABG and AVR in am. Patient has no further questions.   LOS: 3 days    Gaye Pollack 08/25/2020

## 2020-08-26 ENCOUNTER — Encounter (HOSPITAL_COMMUNITY): Payer: Self-pay | Admitting: Cardiovascular Disease

## 2020-08-26 ENCOUNTER — Inpatient Hospital Stay (HOSPITAL_COMMUNITY): Payer: PPO | Admitting: Certified Registered"

## 2020-08-26 ENCOUNTER — Inpatient Hospital Stay (HOSPITAL_COMMUNITY): Payer: PPO

## 2020-08-26 ENCOUNTER — Inpatient Hospital Stay (HOSPITAL_COMMUNITY)
Admission: EM | Disposition: A | Discharge status: Home / Self Care | Payer: Self-pay | Source: Home / Self Care | Attending: Surgery

## 2020-08-26 DIAGNOSIS — Z951 Presence of aortocoronary bypass graft: Secondary | ICD-10-CM

## 2020-08-26 HISTORY — PX: CORONARY ARTERY BYPASS GRAFT: SHX141

## 2020-08-26 HISTORY — PX: TEE WITHOUT CARDIOVERSION: SHX5443

## 2020-08-26 HISTORY — PX: AORTIC VALVE REPLACEMENT: SHX41

## 2020-08-26 LAB — POCT I-STAT 7, (LYTES, BLD GAS, ICA,H+H)
Acid-Base Excess: 1 mmol/L (ref 0.0–2.0)
Acid-Base Excess: 6 mmol/L — ABNORMAL HIGH (ref 0.0–2.0)
Acid-Base Excess: 5 mmol/L — ABNORMAL HIGH (ref 0.0–2.0)
Acid-Base Excess: 3 mmol/L — ABNORMAL HIGH (ref 0.0–2.0)
Acid-Base Excess: 1 mmol/L (ref 0.0–2.0)
Acid-Base Excess: 2 mmol/L (ref 0.0–2.0)
Acid-Base Excess: 1 mmol/L (ref 0.0–2.0)
Acid-base deficit: 1 mmol/L (ref 0.0–2.0)
Acid-base deficit: 3 mmol/L — ABNORMAL HIGH (ref 0.0–2.0)
Acid-base deficit: 2 mmol/L (ref 0.0–2.0)
Acid-base deficit: 1 mmol/L (ref 0.0–2.0)
Bicarbonate: 28.1 mmol/L — ABNORMAL HIGH (ref 20.0–28.0)
Bicarbonate: 33.7 mmol/L — ABNORMAL HIGH (ref 20.0–28.0)
Bicarbonate: 31.3 mmol/L — ABNORMAL HIGH (ref 20.0–28.0)
Bicarbonate: 29.1 mmol/L — ABNORMAL HIGH (ref 20.0–28.0)
Bicarbonate: 26.4 mmol/L (ref 20.0–28.0)
Bicarbonate: 28.6 mmol/L — ABNORMAL HIGH (ref 20.0–28.0)
Bicarbonate: 24.5 mmol/L (ref 20.0–28.0)
Bicarbonate: 27.1 mmol/L (ref 20.0–28.0)
Bicarbonate: 27.7 mmol/L (ref 20.0–28.0)
Bicarbonate: 23.3 mmol/L (ref 20.0–28.0)
Bicarbonate: 24 mmol/L (ref 20.0–28.0)
Calcium, Ion: 1.21 mmol/L (ref 1.15–1.40)
Calcium, Ion: 1.11 mmol/L — ABNORMAL LOW (ref 1.15–1.40)
Calcium, Ion: 1.13 mmol/L — ABNORMAL LOW (ref 1.15–1.40)
Calcium, Ion: 1.14 mmol/L — ABNORMAL LOW (ref 1.15–1.40)
Calcium, Ion: 1.12 mmol/L — ABNORMAL LOW (ref 1.15–1.40)
Calcium, Ion: 1.12 mmol/L — ABNORMAL LOW (ref 1.15–1.40)
Calcium, Ion: 1.27 mmol/L (ref 1.15–1.40)
Calcium, Ion: 1.53 mmol/L (ref 1.15–1.40)
Calcium, Ion: 1.25 mmol/L (ref 1.15–1.40)
Calcium, Ion: 1.21 mmol/L (ref 1.15–1.40)
Calcium, Ion: 1.2 mmol/L (ref 1.15–1.40)
HCT: 49 % (ref 39.0–52.0)
HCT: 42 % (ref 39.0–52.0)
HCT: 42 % (ref 39.0–52.0)
HCT: 42 % (ref 39.0–52.0)
HCT: 40 % (ref 39.0–52.0)
HCT: 41 % (ref 39.0–52.0)
HCT: 43 % (ref 39.0–52.0)
HCT: 47 % (ref 39.0–52.0)
HCT: 44 % (ref 39.0–52.0)
HCT: 45 % (ref 39.0–52.0)
HCT: 44 % (ref 39.0–52.0)
Hemoglobin: 16.7 g/dL (ref 13.0–17.0)
Hemoglobin: 14.3 g/dL (ref 13.0–17.0)
Hemoglobin: 14.3 g/dL (ref 13.0–17.0)
Hemoglobin: 14.3 g/dL (ref 13.0–17.0)
Hemoglobin: 13.6 g/dL (ref 13.0–17.0)
Hemoglobin: 13.9 g/dL (ref 13.0–17.0)
Hemoglobin: 14.6 g/dL (ref 13.0–17.0)
Hemoglobin: 16 g/dL (ref 13.0–17.0)
Hemoglobin: 15 g/dL (ref 13.0–17.0)
Hemoglobin: 15.3 g/dL (ref 13.0–17.0)
Hemoglobin: 15 g/dL (ref 13.0–17.0)
O2 Saturation: 100 %
O2 Saturation: 100 %
O2 Saturation: 100 %
O2 Saturation: 100 %
O2 Saturation: 100 %
O2 Saturation: 100 %
O2 Saturation: 94 %
O2 Saturation: 94 %
O2 Saturation: 82 %
O2 Saturation: 98 %
O2 Saturation: 94 %
Patient temperature: 35.7
Patient temperature: 37.5
Patient temperature: 37.3
Potassium: 4.1 mmol/L (ref 3.5–5.1)
Potassium: 4.5 mmol/L (ref 3.5–5.1)
Potassium: 5.8 mmol/L — ABNORMAL HIGH (ref 3.5–5.1)
Potassium: 6 mmol/L — ABNORMAL HIGH (ref 3.5–5.1)
Potassium: 4.4 mmol/L (ref 3.5–5.1)
Potassium: 4.9 mmol/L (ref 3.5–5.1)
Potassium: 3.9 mmol/L (ref 3.5–5.1)
Potassium: 3.9 mmol/L (ref 3.5–5.1)
Potassium: 3.8 mmol/L (ref 3.5–5.1)
Potassium: 5.1 mmol/L (ref 3.5–5.1)
Potassium: 4.7 mmol/L (ref 3.5–5.1)
Sodium: 137 mmol/L (ref 135–145)
Sodium: 137 mmol/L (ref 135–145)
Sodium: 134 mmol/L — ABNORMAL LOW (ref 135–145)
Sodium: 133 mmol/L — ABNORMAL LOW (ref 135–145)
Sodium: 136 mmol/L (ref 135–145)
Sodium: 137 mmol/L (ref 135–145)
Sodium: 138 mmol/L (ref 135–145)
Sodium: 139 mmol/L (ref 135–145)
Sodium: 139 mmol/L (ref 135–145)
Sodium: 137 mmol/L (ref 135–145)
Sodium: 137 mmol/L (ref 135–145)
TCO2: 30 mmol/L (ref 22–32)
TCO2: 36 mmol/L — ABNORMAL HIGH (ref 22–32)
TCO2: 33 mmol/L — ABNORMAL HIGH (ref 22–32)
TCO2: 31 mmol/L (ref 22–32)
TCO2: 28 mmol/L (ref 22–32)
TCO2: 30 mmol/L (ref 22–32)
TCO2: 26 mmol/L (ref 22–32)
TCO2: 29 mmol/L (ref 22–32)
TCO2: 29 mmol/L (ref 22–32)
TCO2: 25 mmol/L (ref 22–32)
TCO2: 25 mmol/L (ref 22–32)
pCO2 arterial: 53.5 mmHg — ABNORMAL HIGH (ref 32.0–48.0)
pCO2 arterial: 63.8 mmHg — ABNORMAL HIGH (ref 32.0–48.0)
pCO2 arterial: 53.9 mmHg — ABNORMAL HIGH (ref 32.0–48.0)
pCO2 arterial: 50.6 mmHg — ABNORMAL HIGH (ref 32.0–48.0)
pCO2 arterial: 43.3 mmHg (ref 32.0–48.0)
pCO2 arterial: 49 mmHg — ABNORMAL HIGH (ref 32.0–48.0)
pCO2 arterial: 50.1 mmHg — ABNORMAL HIGH (ref 32.0–48.0)
pCO2 arterial: 58.5 mmHg — ABNORMAL HIGH (ref 32.0–48.0)
pCO2 arterial: 47.7 mmHg (ref 32.0–48.0)
pCO2 arterial: 42.2 mmHg (ref 32.0–48.0)
pCO2 arterial: 41.8 mmHg (ref 32.0–48.0)
pH, Arterial: 7.328 — ABNORMAL LOW (ref 7.350–7.450)
pH, Arterial: 7.33 — ABNORMAL LOW (ref 7.350–7.450)
pH, Arterial: 7.372 (ref 7.350–7.450)
pH, Arterial: 7.368 (ref 7.350–7.450)
pH, Arterial: 7.374 (ref 7.350–7.450)
pH, Arterial: 7.394 (ref 7.350–7.450)
pH, Arterial: 7.273 — ABNORMAL LOW (ref 7.350–7.450)
pH, Arterial: 7.298 — ABNORMAL LOW (ref 7.350–7.450)
pH, Arterial: 7.366 (ref 7.350–7.450)
pH, Arterial: 7.353 (ref 7.350–7.450)
pH, Arterial: 7.368 (ref 7.350–7.450)
pO2, Arterial: 200 mmHg — ABNORMAL HIGH (ref 83.0–108.0)
pO2, Arterial: 372 mmHg — ABNORMAL HIGH (ref 83.0–108.0)
pO2, Arterial: 320 mmHg — ABNORMAL HIGH (ref 83.0–108.0)
pO2, Arterial: 284 mmHg — ABNORMAL HIGH (ref 83.0–108.0)
pO2, Arterial: 297 mmHg — ABNORMAL HIGH (ref 83.0–108.0)
pO2, Arterial: 417 mmHg — ABNORMAL HIGH (ref 83.0–108.0)
pO2, Arterial: 81 mmHg — ABNORMAL LOW (ref 83.0–108.0)
pO2, Arterial: 85 mmHg (ref 83.0–108.0)
pO2, Arterial: 46 mmHg — ABNORMAL LOW (ref 83.0–108.0)
pO2, Arterial: 110 mmHg — ABNORMAL HIGH (ref 83.0–108.0)
pO2, Arterial: 76 mmHg — ABNORMAL LOW (ref 83.0–108.0)

## 2020-08-26 LAB — POCT I-STAT, CHEM 8
BUN: 16 mg/dL (ref 8–23)
BUN: 16 mg/dL (ref 8–23)
BUN: 16 mg/dL (ref 8–23)
BUN: 16 mg/dL (ref 8–23)
BUN: 17 mg/dL (ref 8–23)
BUN: 17 mg/dL (ref 8–23)
BUN: 18 mg/dL (ref 8–23)
Calcium, Ion: 1.25 mmol/L (ref 1.15–1.40)
Calcium, Ion: 1.12 mmol/L — ABNORMAL LOW (ref 1.15–1.40)
Calcium, Ion: 1.29 mmol/L (ref 1.15–1.40)
Calcium, Ion: 1.14 mmol/L — ABNORMAL LOW (ref 1.15–1.40)
Calcium, Ion: 1.18 mmol/L (ref 1.15–1.40)
Calcium, Ion: 1.13 mmol/L — ABNORMAL LOW (ref 1.15–1.40)
Calcium, Ion: 1.16 mmol/L (ref 1.15–1.40)
Chloride: 99 mmol/L (ref 98–111)
Chloride: 98 mmol/L (ref 98–111)
Chloride: 98 mmol/L (ref 98–111)
Chloride: 97 mmol/L — ABNORMAL LOW (ref 98–111)
Chloride: 98 mmol/L (ref 98–111)
Chloride: 99 mmol/L (ref 98–111)
Chloride: 100 mmol/L (ref 98–111)
Creatinine, Ser: 1.1 mg/dL (ref 0.61–1.24)
Creatinine, Ser: 1.1 mg/dL (ref 0.61–1.24)
Creatinine, Ser: 1.2 mg/dL (ref 0.61–1.24)
Creatinine, Ser: 1.2 mg/dL (ref 0.61–1.24)
Creatinine, Ser: 1.1 mg/dL (ref 0.61–1.24)
Creatinine, Ser: 1.1 mg/dL (ref 0.61–1.24)
Creatinine, Ser: 1.1 mg/dL (ref 0.61–1.24)
Glucose, Bld: 91 mg/dL (ref 70–99)
Glucose, Bld: 89 mg/dL (ref 70–99)
Glucose, Bld: 92 mg/dL (ref 70–99)
Glucose, Bld: 110 mg/dL — ABNORMAL HIGH (ref 70–99)
Glucose, Bld: 131 mg/dL — ABNORMAL HIGH (ref 70–99)
Glucose, Bld: 136 mg/dL — ABNORMAL HIGH (ref 70–99)
Glucose, Bld: 134 mg/dL — ABNORMAL HIGH (ref 70–99)
HCT: 50 % (ref 39.0–52.0)
HCT: 41 % (ref 39.0–52.0)
HCT: 50 % (ref 39.0–52.0)
HCT: 42 % (ref 39.0–52.0)
HCT: 42 % (ref 39.0–52.0)
HCT: 42 % (ref 39.0–52.0)
HCT: 42 % (ref 39.0–52.0)
Hemoglobin: 17 g/dL (ref 13.0–17.0)
Hemoglobin: 13.9 g/dL (ref 13.0–17.0)
Hemoglobin: 17 g/dL (ref 13.0–17.0)
Hemoglobin: 14.3 g/dL (ref 13.0–17.0)
Hemoglobin: 14.3 g/dL (ref 13.0–17.0)
Hemoglobin: 14.3 g/dL (ref 13.0–17.0)
Hemoglobin: 14.3 g/dL (ref 13.0–17.0)
Potassium: 4 mmol/L (ref 3.5–5.1)
Potassium: 4.6 mmol/L (ref 3.5–5.1)
Potassium: 4.7 mmol/L (ref 3.5–5.1)
Potassium: 5.9 mmol/L — ABNORMAL HIGH (ref 3.5–5.1)
Potassium: 5.7 mmol/L — ABNORMAL HIGH (ref 3.5–5.1)
Potassium: 5.5 mmol/L — ABNORMAL HIGH (ref 3.5–5.1)
Potassium: 4.4 mmol/L (ref 3.5–5.1)
Sodium: 138 mmol/L (ref 135–145)
Sodium: 136 mmol/L (ref 135–145)
Sodium: 137 mmol/L (ref 135–145)
Sodium: 134 mmol/L — ABNORMAL LOW (ref 135–145)
Sodium: 133 mmol/L — ABNORMAL LOW (ref 135–145)
Sodium: 134 mmol/L — ABNORMAL LOW (ref 135–145)
Sodium: 137 mmol/L (ref 135–145)
TCO2: 25 mmol/L (ref 22–32)
TCO2: 28 mmol/L (ref 22–32)
TCO2: 30 mmol/L (ref 22–32)
TCO2: 28 mmol/L (ref 22–32)
TCO2: 27 mmol/L (ref 22–32)
TCO2: 26 mmol/L (ref 22–32)
TCO2: 27 mmol/L (ref 22–32)

## 2020-08-26 LAB — CBC
HCT: 50.6 % (ref 39.0–52.0)
HCT: 46.2 % (ref 39.0–52.0)
HCT: 45.1 % (ref 39.0–52.0)
Hemoglobin: 17.2 g/dL — ABNORMAL HIGH (ref 13.0–17.0)
Hemoglobin: 15.6 g/dL (ref 13.0–17.0)
Hemoglobin: 15.1 g/dL (ref 13.0–17.0)
MCH: 33.3 pg (ref 26.0–34.0)
MCH: 33.7 pg (ref 26.0–34.0)
MCH: 32.8 pg (ref 26.0–34.0)
MCHC: 34 g/dL (ref 30.0–36.0)
MCHC: 33.8 g/dL (ref 30.0–36.0)
MCHC: 33.5 g/dL (ref 30.0–36.0)
MCV: 97.9 fL (ref 80.0–100.0)
MCV: 99.8 fL (ref 80.0–100.0)
MCV: 98 fL (ref 80.0–100.0)
Platelets: 165 10*3/uL (ref 150–400)
Platelets: 97 10*3/uL — ABNORMAL LOW (ref 150–400)
Platelets: 134 10*3/uL — ABNORMAL LOW (ref 150–400)
RBC: 5.17 MIL/uL (ref 4.22–5.81)
RBC: 4.63 MIL/uL (ref 4.22–5.81)
RBC: 4.6 MIL/uL (ref 4.22–5.81)
RDW: 12.7 % (ref 11.5–15.5)
RDW: 12.7 % (ref 11.5–15.5)
RDW: 12.9 % (ref 11.5–15.5)
WBC: 5.8 10*3/uL (ref 4.0–10.5)
WBC: 8 10*3/uL (ref 4.0–10.5)
WBC: 10.2 10*3/uL (ref 4.0–10.5)
nRBC: 0 % (ref 0.0–0.2)
nRBC: 0 % (ref 0.0–0.2)
nRBC: 0 % (ref 0.0–0.2)

## 2020-08-26 LAB — BASIC METABOLIC PANEL
Anion gap: 8 (ref 5–15)
Anion gap: 8 (ref 5–15)
BUN: 13 mg/dL (ref 8–23)
BUN: 18 mg/dL (ref 8–23)
CO2: 25 mmol/L (ref 22–32)
CO2: 23 mmol/L (ref 22–32)
Calcium: 9.1 mg/dL (ref 8.9–10.3)
Calcium: 8.3 mg/dL — ABNORMAL LOW (ref 8.9–10.3)
Chloride: 101 mmol/L (ref 98–111)
Chloride: 105 mmol/L (ref 98–111)
Creatinine, Ser: 1.31 mg/dL — ABNORMAL HIGH (ref 0.61–1.24)
Creatinine, Ser: 1.3 mg/dL — ABNORMAL HIGH (ref 0.61–1.24)
GFR, Estimated: 51 mL/min — ABNORMAL LOW (ref >60)
GFR, Estimated: 52 mL/min — ABNORMAL LOW (ref >60)
Glucose, Bld: 93 mg/dL (ref 70–99)
Glucose, Bld: 141 mg/dL — ABNORMAL HIGH (ref 70–99)
Potassium: 4 mmol/L (ref 3.5–5.1)
Potassium: 5.4 mmol/L — ABNORMAL HIGH (ref 3.5–5.1)
Sodium: 134 mmol/L — ABNORMAL LOW (ref 135–145)
Sodium: 136 mmol/L (ref 135–145)

## 2020-08-26 LAB — GLUCOSE, CAPILLARY
Glucose-Capillary: 103 mg/dL — ABNORMAL HIGH (ref 70–99)
Glucose-Capillary: 123 mg/dL — ABNORMAL HIGH (ref 70–99)
Glucose-Capillary: 126 mg/dL — ABNORMAL HIGH (ref 70–99)
Glucose-Capillary: 131 mg/dL — ABNORMAL HIGH (ref 70–99)
Glucose-Capillary: 134 mg/dL — ABNORMAL HIGH (ref 70–99)
Glucose-Capillary: 141 mg/dL — ABNORMAL HIGH (ref 70–99)
Glucose-Capillary: 143 mg/dL — ABNORMAL HIGH (ref 70–99)
Glucose-Capillary: 154 mg/dL — ABNORMAL HIGH (ref 70–99)

## 2020-08-26 LAB — POCT I-STAT EG7
Acid-Base Excess: 2 mmol/L (ref 0.0–2.0)
Bicarbonate: 29.7 mmol/L — ABNORMAL HIGH (ref 20.0–28.0)
Calcium, Ion: 1.15 mmol/L (ref 1.15–1.40)
HCT: 41 % (ref 39.0–52.0)
Hemoglobin: 13.9 g/dL (ref 13.0–17.0)
O2 Saturation: 88 %
Potassium: 5.5 mmol/L — ABNORMAL HIGH (ref 3.5–5.1)
Sodium: 135 mmol/L (ref 135–145)
TCO2: 31 mmol/L (ref 22–32)
pCO2, Ven: 57.1 mmHg (ref 44.0–60.0)
pH, Ven: 7.323 (ref 7.250–7.430)
pO2, Ven: 60 mmHg — ABNORMAL HIGH (ref 32.0–45.0)

## 2020-08-26 LAB — HEMOGLOBIN AND HEMATOCRIT, BLOOD
HCT: 42.7 % (ref 39.0–52.0)
Hemoglobin: 14.2 g/dL (ref 13.0–17.0)

## 2020-08-26 LAB — HEPARIN LEVEL (UNFRACTIONATED): Heparin Unfractionated: 0.36 IU/mL (ref 0.30–0.70)

## 2020-08-26 LAB — ABO/RH: ABO/RH(D): O POS

## 2020-08-26 LAB — MAGNESIUM: Magnesium: 2.7 mg/dL — ABNORMAL HIGH (ref 1.7–2.4)

## 2020-08-26 LAB — PROTIME-INR
INR: 1.6 — ABNORMAL HIGH (ref 0.8–1.2)
Prothrombin Time: 18.2 seconds — ABNORMAL HIGH (ref 11.4–15.2)

## 2020-08-26 LAB — APTT: aPTT: 46 seconds — ABNORMAL HIGH (ref 24–36)

## 2020-08-26 LAB — PLATELET COUNT: Platelets: 106 10*3/uL — ABNORMAL LOW (ref 150–400)

## 2020-08-26 SURGERY — CORONARY ARTERY BYPASS GRAFTING (CABG)
Anesthesia: General | Site: Chest

## 2020-08-26 MED ORDER — INSULIN REGULAR(HUMAN) IN NACL 100-0.9 UT/100ML-% IV SOLN
INTRAVENOUS | Status: DC
  Administered 2020-08-26: 0.3 [IU]/h via INTRAVENOUS

## 2020-08-26 MED ORDER — MORPHINE SULFATE (PF) 2 MG/ML IV SOLN
1.0000 mg | INTRAVENOUS | Status: DC | PRN
  Administered 2020-08-26 (×2): 1 mg via INTRAVENOUS
  Filled 2020-08-26: qty 1

## 2020-08-26 MED ORDER — LACTATED RINGERS IV SOLN: 500.0000 mL | Freq: Once | INTRAVENOUS | Status: DC | PRN

## 2020-08-26 MED ORDER — ORAL CARE MOUTH RINSE
15.0000 mL | Freq: Two times a day (BID) | OROMUCOSAL | Status: DC
  Administered 2020-08-27: 15 mL via OROMUCOSAL

## 2020-08-26 MED ORDER — TRAMADOL HCL 50 MG PO TABS
50.0000 mg | ORAL_TABLET | ORAL | Status: DC | PRN
  Administered 2020-08-27 – 2020-08-28 (×5): 100 mg via ORAL
  Filled 2020-08-26 (×5): qty 2

## 2020-08-26 MED ORDER — FAMOTIDINE IN NACL 20-0.9 MG/50ML-% IV SOLN
20.0000 mg | Freq: Two times a day (BID) | INTRAVENOUS | Status: AC
  Administered 2020-08-26 (×2): 20 mg via INTRAVENOUS
  Filled 2020-08-26: qty 50

## 2020-08-26 MED ORDER — ACETAMINOPHEN 160 MG/5ML PO SOLN: 1000.0000 mg | Freq: Four times a day (QID) | ORAL | Status: DC

## 2020-08-26 MED ORDER — SODIUM CHLORIDE 0.9 % IV SOLN: INTRAVENOUS | Status: DC | PRN

## 2020-08-26 MED ORDER — MIDAZOLAM HCL 2 MG/2ML IJ SOLN: 2.0000 mg | INTRAMUSCULAR | Status: DC | PRN

## 2020-08-26 MED ORDER — SUCCINYLCHOLINE CHLORIDE 200 MG/10ML IV SOSY
PREFILLED_SYRINGE | INTRAVENOUS | Status: AC
  Filled 2020-08-26: qty 10

## 2020-08-26 MED ORDER — PROPOFOL 10 MG/ML IV BOLUS
INTRAVENOUS | Status: DC | PRN
  Administered 2020-08-26: 120 mg via INTRAVENOUS
  Administered 2020-08-26: 30 mg via INTRAVENOUS

## 2020-08-26 MED ORDER — EPINEPHRINE HCL 5 MG/250ML IV SOLN IN NS
INTRAVENOUS | Status: DC | PRN
  Administered 2020-08-26: 9 ug/min via INTRAVENOUS
  Administered 2020-08-26: 5 ug/min via INTRAVENOUS

## 2020-08-26 MED ORDER — PANTOPRAZOLE SODIUM 40 MG PO TBEC
40.0000 mg | DELAYED_RELEASE_TABLET | Freq: Every day | ORAL | Status: DC
  Administered 2020-08-28 – 2020-08-31 (×4): 40 mg via ORAL
  Filled 2020-08-26 (×4): qty 1

## 2020-08-26 MED ORDER — 0.9 % SODIUM CHLORIDE (POUR BTL) OPTIME
TOPICAL | Status: DC | PRN
  Administered 2020-08-26: 6000 mL

## 2020-08-26 MED ORDER — SODIUM CHLORIDE 0.9% FLUSH
3.0000 mL | Freq: Two times a day (BID) | INTRAVENOUS | Status: DC
  Administered 2020-08-27 – 2020-08-28 (×4): 3 mL via INTRAVENOUS

## 2020-08-26 MED ORDER — POTASSIUM CHLORIDE 10 MEQ/50ML IV SOLN
10.0000 meq | INTRAVENOUS | Status: AC
  Administered 2020-08-26 (×3): 10 meq via INTRAVENOUS

## 2020-08-26 MED ORDER — PROPOFOL 10 MG/ML IV BOLUS
INTRAVENOUS | Status: AC
  Filled 2020-08-26: qty 20

## 2020-08-26 MED ORDER — ROCURONIUM BROMIDE 10 MG/ML (PF) SYRINGE
PREFILLED_SYRINGE | INTRAVENOUS | Status: AC
  Filled 2020-08-26: qty 10

## 2020-08-26 MED ORDER — ONDANSETRON HCL 4 MG/2ML IJ SOLN
4.0000 mg | Freq: Four times a day (QID) | INTRAMUSCULAR | Status: DC | PRN
  Administered 2020-08-26: 4 mg via INTRAVENOUS
  Filled 2020-08-26: qty 2

## 2020-08-26 MED ORDER — LEVOFLOXACIN IN D5W 500 MG/100ML IV SOLN
500.0000 mg | Freq: Once | INTRAVENOUS | Status: AC
  Administered 2020-08-27: 500 mg via INTRAVENOUS
  Filled 2020-08-26: qty 100

## 2020-08-26 MED ORDER — MAGNESIUM SULFATE 4 GM/100ML IV SOLN
4.0000 g | Freq: Once | INTRAVENOUS | Status: AC
  Administered 2020-08-26: 4 g via INTRAVENOUS

## 2020-08-26 MED ORDER — DEXAMETHASONE SODIUM PHOSPHATE 10 MG/ML IJ SOLN
INTRAMUSCULAR | Status: DC | PRN
  Administered 2020-08-26: 5 mg via INTRAVENOUS

## 2020-08-26 MED ORDER — METOPROLOL TARTRATE 5 MG/5ML IV SOLN: 2.5000 mg | INTRAVENOUS | Status: DC | PRN

## 2020-08-26 MED ORDER — THROMBIN (RECOMBINANT) 20000 UNITS EX SOLR
CUTANEOUS | Status: AC
  Filled 2020-08-26: qty 20000

## 2020-08-26 MED ORDER — DEXTROSE 50 % IV SOLN: 0.0000 mL | INTRAVENOUS | Status: DC | PRN

## 2020-08-26 MED ORDER — LACTATED RINGERS IV SOLN: INTRAVENOUS | Status: DC

## 2020-08-26 MED ORDER — SUCCINYLCHOLINE CHLORIDE 20 MG/ML IJ SOLN
INTRAMUSCULAR | Status: DC | PRN
  Administered 2020-08-26: 140 mg via INTRAVENOUS

## 2020-08-26 MED ORDER — NITROGLYCERIN IN D5W 200-5 MCG/ML-% IV SOLN: 0.0000 ug/min | INTRAVENOUS | Status: DC

## 2020-08-26 MED ORDER — ALBUMIN HUMAN 5 % IV SOLN: INTRAVENOUS | Status: DC | PRN

## 2020-08-26 MED ORDER — VASOPRESSIN 20 UNIT/ML IV SOLN
INTRAVENOUS | Status: AC
  Filled 2020-08-26: qty 1

## 2020-08-26 MED ORDER — CALCIUM CHLORIDE 10 % IV SOLN
INTRAVENOUS | Status: DC | PRN
  Administered 2020-08-26: 400 mg via INTRAVENOUS
  Administered 2020-08-26: 600 mg via INTRAVENOUS

## 2020-08-26 MED ORDER — BISACODYL 10 MG RE SUPP: 10.0000 mg | Freq: Every day | RECTAL | Status: DC

## 2020-08-26 MED ORDER — LIDOCAINE 2% (20 MG/ML) 5 ML SYRINGE
INTRAMUSCULAR | Status: AC
  Filled 2020-08-26: qty 5

## 2020-08-26 MED ORDER — PHENYLEPHRINE 40 MCG/ML (10ML) SYRINGE FOR IV PUSH (FOR BLOOD PRESSURE SUPPORT)
PREFILLED_SYRINGE | INTRAVENOUS | Status: DC | PRN
  Administered 2020-08-26 (×2): 80 ug via INTRAVENOUS
  Administered 2020-08-26 (×2): 120 ug via INTRAVENOUS
  Administered 2020-08-26: 80 ug via INTRAVENOUS
  Administered 2020-08-26: 40 ug via INTRAVENOUS

## 2020-08-26 MED ORDER — MIDAZOLAM HCL 5 MG/5ML IJ SOLN
INTRAMUSCULAR | Status: DC | PRN
  Administered 2020-08-26 (×3): 1 mg via INTRAVENOUS
  Administered 2020-08-26: 2 mg via INTRAVENOUS

## 2020-08-26 MED ORDER — CHLORHEXIDINE GLUCONATE CLOTH 2 % EX PADS
6.0000 | MEDICATED_PAD | Freq: Every day | CUTANEOUS | Status: DC
  Administered 2020-08-27 – 2020-08-29 (×3): 6 via TOPICAL

## 2020-08-26 MED ORDER — ASPIRIN 81 MG PO CHEW: 324.0000 mg | CHEWABLE_TABLET | Freq: Every day | ORAL | Status: DC

## 2020-08-26 MED ORDER — ORAL CARE MOUTH RINSE
15.0000 mL | OROMUCOSAL | Status: DC
  Administered 2020-08-26 (×2): 15 mL via OROMUCOSAL

## 2020-08-26 MED ORDER — METOPROLOL TARTRATE 12.5 MG HALF TABLET
12.5000 mg | ORAL_TABLET | Freq: Two times a day (BID) | ORAL | Status: DC
  Administered 2020-08-27 – 2020-08-31 (×9): 12.5 mg via ORAL
  Filled 2020-08-26 (×9): qty 1

## 2020-08-26 MED ORDER — TRANEXAMIC ACID 1000 MG/10ML IV SOLN
1.5000 mg/kg/h | INTRAVENOUS | Status: DC
  Filled 2020-08-26: qty 25

## 2020-08-26 MED ORDER — EPINEPHRINE 1 MG/10ML IJ SOSY
PREFILLED_SYRINGE | INTRAMUSCULAR | Status: DC | PRN
  Administered 2020-08-26 (×3): .01 mg via INTRAVENOUS
  Administered 2020-08-26 (×2): .02 mg via INTRAVENOUS
  Administered 2020-08-26: .01 mg via INTRAVENOUS

## 2020-08-26 MED ORDER — THROMBIN 20000 UNITS EX SOLR
CUTANEOUS | Status: DC | PRN
  Administered 2020-08-26: 20000 [IU] via TOPICAL

## 2020-08-26 MED ORDER — NOREPINEPHRINE 4 MG/250ML-% IV SOLN: 0.0000 ug/min | INTRAVENOUS | Status: DC

## 2020-08-26 MED ORDER — LACTATED RINGERS IV SOLN: INTRAVENOUS | Status: DC | PRN

## 2020-08-26 MED ORDER — LIDOCAINE 2% (20 MG/ML) 5 ML SYRINGE
INTRAMUSCULAR | Status: DC | PRN
  Administered 2020-08-26: 100 mg via INTRAVENOUS

## 2020-08-26 MED ORDER — HEMOSTATIC AGENTS (NO CHARGE) OPTIME
TOPICAL | Status: DC | PRN
  Administered 2020-08-26: 1 via TOPICAL

## 2020-08-26 MED ORDER — ACETAMINOPHEN 160 MG/5ML PO SOLN: 650.0000 mg | Freq: Once | ORAL | Status: AC

## 2020-08-26 MED ORDER — EPHEDRINE 5 MG/ML INJ
INTRAVENOUS | Status: AC
  Filled 2020-08-26: qty 10

## 2020-08-26 MED ORDER — ROCURONIUM BROMIDE 10 MG/ML (PF) SYRINGE
PREFILLED_SYRINGE | INTRAVENOUS | Status: DC | PRN
  Administered 2020-08-26 (×5): 50 mg via INTRAVENOUS

## 2020-08-26 MED ORDER — ASPIRIN EC 325 MG PO TBEC
325.0000 mg | DELAYED_RELEASE_TABLET | Freq: Every day | ORAL | Status: DC
  Administered 2020-08-27 – 2020-08-31 (×5): 325 mg via ORAL
  Filled 2020-08-26 (×5): qty 1

## 2020-08-26 MED ORDER — CHLORHEXIDINE GLUCONATE 0.12% ORAL RINSE (MEDLINE KIT)
15.0000 mL | Freq: Two times a day (BID) | OROMUCOSAL | Status: DC
  Administered 2020-08-26 – 2020-08-27 (×2): 15 mL via OROMUCOSAL

## 2020-08-26 MED ORDER — CHLORHEXIDINE GLUCONATE CLOTH 2 % EX PADS
6.0000 | MEDICATED_PAD | Freq: Every day | CUTANEOUS | Status: DC
  Administered 2020-08-26 – 2020-08-30 (×3): 6 via TOPICAL

## 2020-08-26 MED ORDER — DEXMEDETOMIDINE HCL IN NACL 400 MCG/100ML IV SOLN: 0.0000 ug/kg/h | INTRAVENOUS | Status: DC

## 2020-08-26 MED ORDER — PHENYLEPHRINE HCL-NACL 20-0.9 MG/250ML-% IV SOLN: 0.0000 ug/min | INTRAVENOUS | Status: DC

## 2020-08-26 MED ORDER — SODIUM CHLORIDE 0.9% IV SOLUTION: Freq: Once | INTRAVENOUS | Status: AC

## 2020-08-26 MED ORDER — HYDROCORTISONE NA SUCCINATE PF 250 MG IJ SOLR
125.0000 mg | Freq: Once | INTRAMUSCULAR | Status: AC
  Administered 2020-08-26: 125 mg via INTRAVENOUS

## 2020-08-26 MED ORDER — SODIUM CHLORIDE 0.9% FLUSH: 10.0000 mL | INTRAVENOUS | Status: DC | PRN

## 2020-08-26 MED ORDER — ACETAMINOPHEN 650 MG RE SUPP
650.0000 mg | Freq: Once | RECTAL | Status: AC
  Administered 2020-08-26: 650 mg via RECTAL

## 2020-08-26 MED ORDER — ALBUMIN HUMAN 5 % IV SOLN
250.0000 mL | INTRAVENOUS | Status: AC | PRN
  Administered 2020-08-26: 12.5 g via INTRAVENOUS
  Filled 2020-08-26: qty 250

## 2020-08-26 MED ORDER — ACETAMINOPHEN 500 MG PO TABS
1000.0000 mg | ORAL_TABLET | Freq: Four times a day (QID) | ORAL | Status: DC
  Administered 2020-08-26 – 2020-08-27 (×2): 1000 mg via ORAL
  Administered 2020-08-27: 500 mg via ORAL
  Administered 2020-08-27 – 2020-08-31 (×12): 1000 mg via ORAL
  Filled 2020-08-26 (×16): qty 2

## 2020-08-26 MED ORDER — FENTANYL CITRATE (PF) 250 MCG/5ML IJ SOLN
INTRAMUSCULAR | Status: DC | PRN
Start: 2020-08-26 — End: 2020-08-26
  Administered 2020-08-26: 25 ug via INTRAVENOUS
  Administered 2020-08-26 (×3): 50 ug via INTRAVENOUS
  Administered 2020-08-26: 100 ug via INTRAVENOUS
  Administered 2020-08-26: 25 ug via INTRAVENOUS
  Administered 2020-08-26 (×3): 100 ug via INTRAVENOUS
  Administered 2020-08-26: 150 ug via INTRAVENOUS
  Administered 2020-08-26 (×2): 100 ug via INTRAVENOUS
  Administered 2020-08-26: 50 ug via INTRAVENOUS

## 2020-08-26 MED ORDER — DIPHENHYDRAMINE HCL 50 MG/ML IJ SOLN
INTRAMUSCULAR | Status: DC | PRN
  Administered 2020-08-26 (×2): 25 mg via INTRAVENOUS

## 2020-08-26 MED ORDER — HEPARIN SODIUM (PORCINE) 1000 UNIT/ML IJ SOLN
INTRAMUSCULAR | Status: DC | PRN
  Administered 2020-08-26: 37000 [IU] via INTRAVENOUS

## 2020-08-26 MED ORDER — PROTAMINE SULFATE 10 MG/ML IV SOLN
INTRAVENOUS | Status: AC
  Filled 2020-08-26: qty 25

## 2020-08-26 MED ORDER — PROTAMINE SULFATE 10 MG/ML IV SOLN
INTRAVENOUS | Status: DC | PRN
  Administered 2020-08-26: 370 mg via INTRAVENOUS

## 2020-08-26 MED ORDER — VANCOMYCIN HCL IN DEXTROSE 1-5 GM/200ML-% IV SOLN
1000.0000 mg | Freq: Once | INTRAVENOUS | Status: AC
  Administered 2020-08-26: 1000 mg via INTRAVENOUS
  Filled 2020-08-26: qty 200

## 2020-08-26 MED ORDER — FENTANYL CITRATE (PF) 250 MCG/5ML IJ SOLN
INTRAMUSCULAR | Status: AC
  Filled 2020-08-26: qty 25

## 2020-08-26 MED ORDER — FAMOTIDINE IN NACL 20-0.9 MG/50ML-% IV SOLN
20.0000 mg | Freq: Once | INTRAVENOUS | Status: AC
  Administered 2020-08-26: 20 mg via INTRAVENOUS
  Filled 2020-08-26: qty 50

## 2020-08-26 MED ORDER — PHENYLEPHRINE 40 MCG/ML (10ML) SYRINGE FOR IV PUSH (FOR BLOOD PRESSURE SUPPORT)
PREFILLED_SYRINGE | INTRAVENOUS | Status: AC
  Filled 2020-08-26: qty 10

## 2020-08-26 MED ORDER — SODIUM BICARBONATE 8.4 % IV SOLN
INTRAVENOUS | Status: DC | PRN
  Administered 2020-08-26: 25 meq via INTRAVENOUS

## 2020-08-26 MED ORDER — OXYCODONE HCL 5 MG PO TABS
5.0000 mg | ORAL_TABLET | ORAL | Status: DC | PRN
  Administered 2020-08-26: 5 mg via ORAL
  Administered 2020-08-27: 10 mg via ORAL
  Administered 2020-08-27 (×2): 5 mg via ORAL
  Administered 2020-08-28 – 2020-08-30 (×4): 10 mg via ORAL
  Filled 2020-08-26 (×2): qty 2
  Filled 2020-08-26: qty 1
  Filled 2020-08-26 (×4): qty 2
  Filled 2020-08-26: qty 1

## 2020-08-26 MED ORDER — SODIUM CHLORIDE 0.45 % IV SOLN: INTRAVENOUS | Status: DC | PRN

## 2020-08-26 MED ORDER — THROMBIN 20000 UNITS EX SOLR
OROMUCOSAL | Status: DC | PRN
  Administered 2020-08-26 (×3): 4 mL via TOPICAL

## 2020-08-26 MED ORDER — BISACODYL 5 MG PO TBEC
10.0000 mg | DELAYED_RELEASE_TABLET | Freq: Every day | ORAL | Status: DC
  Administered 2020-08-27 – 2020-08-31 (×5): 10 mg via ORAL
  Filled 2020-08-26 (×5): qty 2

## 2020-08-26 MED ORDER — PLASMA-LYTE 148 IV SOLN
INTRAVENOUS | Status: DC | PRN
  Administered 2020-08-26: 500 mL via INTRAVASCULAR

## 2020-08-26 MED ORDER — SODIUM CHLORIDE 0.9% FLUSH: 3.0000 mL | INTRAVENOUS | Status: DC | PRN

## 2020-08-26 MED ORDER — SODIUM CHLORIDE 0.9 % IV SOLN: 1.5000 g | Freq: Two times a day (BID) | INTRAVENOUS | Status: DC

## 2020-08-26 MED ORDER — PROTAMINE SULFATE 10 MG/ML IV SOLN
INTRAVENOUS | Status: AC
  Filled 2020-08-26: qty 10

## 2020-08-26 MED ORDER — METOPROLOL TARTRATE 25 MG/10 ML ORAL SUSPENSION
12.5000 mg | Freq: Two times a day (BID) | ORAL | Status: DC
  Filled 2020-08-26 (×3): qty 5

## 2020-08-26 MED ORDER — MIDAZOLAM HCL (PF) 10 MG/2ML IJ SOLN
INTRAMUSCULAR | Status: AC
  Filled 2020-08-26: qty 2

## 2020-08-26 MED ORDER — SODIUM CHLORIDE 0.9 % IV SOLN: 250.0000 mL | INTRAVENOUS | Status: DC

## 2020-08-26 MED ORDER — CHLORHEXIDINE GLUCONATE 0.12 % MT SOLN
15.0000 mL | OROMUCOSAL | Status: AC
  Administered 2020-08-26: 15 mL via OROMUCOSAL

## 2020-08-26 MED ORDER — SODIUM CHLORIDE 0.9 % IV SOLN: INTRAVENOUS | Status: DC

## 2020-08-26 MED ORDER — DOCUSATE SODIUM 100 MG PO CAPS
200.0000 mg | ORAL_CAPSULE | Freq: Every day | ORAL | Status: DC
  Administered 2020-08-27 – 2020-08-31 (×5): 200 mg via ORAL
  Filled 2020-08-26 (×5): qty 2

## 2020-08-26 MED ORDER — HEPARIN SODIUM (PORCINE) 1000 UNIT/ML IJ SOLN
INTRAMUSCULAR | Status: AC
  Filled 2020-08-26: qty 1

## 2020-08-26 MED ORDER — SODIUM CHLORIDE 0.9% FLUSH
10.0000 mL | Freq: Two times a day (BID) | INTRAVENOUS | Status: DC
  Administered 2020-08-26: 20 mL
  Administered 2020-08-27: 30 mL
  Administered 2020-08-27: 20 mL
  Administered 2020-08-28 – 2020-08-29 (×3): 10 mL

## 2020-08-26 MED FILL — Magnesium Sulfate Inj 50%: INTRAMUSCULAR | Qty: 10 | Status: AC

## 2020-08-26 MED FILL — Potassium Chloride Inj 2 mEq/ML: INTRAVENOUS | Qty: 40 | Status: AC

## 2020-08-26 MED FILL — Heparin Sodium (Porcine) Inj 1000 Unit/ML: INTRAMUSCULAR | Qty: 2500 | Status: CN

## 2020-08-26 MED FILL — Epinephrine PF Inj 1 MG/ML: INTRAMUSCULAR | Qty: 1 | Status: CN

## 2020-08-26 SURGICAL SUPPLY — 123 items
ADAPTER CARDIO PERF ANTE/RETRO (ADAPTER) ×3 IMPLANT
BAG DECANTER FOR FLEXI CONT (MISCELLANEOUS) ×3 IMPLANT
BLADE CLIPPER SURG (BLADE) ×3 IMPLANT
BLADE STERNUM SYSTEM 6 (BLADE) ×3 IMPLANT
BLADE SURG 15 STRL LF DISP TIS (BLADE) ×2 IMPLANT
BLADE SURG 15 STRL SS (BLADE) ×3
BNDG ELASTIC 4X5.8 VLCR STR LF (GAUZE/BANDAGES/DRESSINGS) ×3 IMPLANT
BNDG ELASTIC 6X10 VLCR STRL LF (GAUZE/BANDAGES/DRESSINGS) ×3 IMPLANT
BNDG ELASTIC 6X5.8 VLCR STR LF (GAUZE/BANDAGES/DRESSINGS) ×3 IMPLANT
BNDG GAUZE ELAST 4 BULKY (GAUZE/BANDAGES/DRESSINGS) ×3 IMPLANT
CANISTER SUCT 3000ML PPV (MISCELLANEOUS) ×3 IMPLANT
CANNULA ARTERIAL NVNT 3/8 22FR (MISCELLANEOUS) ×3 IMPLANT
CANNULA GUNDRY RCSP 15FR (MISCELLANEOUS) ×3 IMPLANT
CATH HEART VENT LEFT (CATHETERS) ×2 IMPLANT
CATH ROBINSON RED A/P 18FR (CATHETERS) ×9 IMPLANT
CATH THORACIC 28FR (CATHETERS) ×3 IMPLANT
CATH THORACIC 36FR (CATHETERS) ×3 IMPLANT
CATH THORACIC 36FR RT ANG (CATHETERS) ×3 IMPLANT
CLIP VESOCCLUDE MED 24/CT (CLIP) IMPLANT
CLIP VESOCCLUDE SM WIDE 24/CT (CLIP) ×6 IMPLANT
CNTNR URN SCR LID CUP LEK RST (MISCELLANEOUS) ×2 IMPLANT
CONT SPEC 4OZ STRL OR WHT (MISCELLANEOUS) ×3
COVER MAYO STAND STRL (DRAPES) ×6 IMPLANT
COVER SURGICAL LIGHT HANDLE (MISCELLANEOUS) ×6 IMPLANT
DERMABOND ADHESIVE PROPEN (GAUZE/BANDAGES/DRESSINGS) ×1
DERMABOND ADVANCED .7 DNX6 (GAUZE/BANDAGES/DRESSINGS) ×2 IMPLANT
DRAPE CARDIOVASCULAR INCISE (DRAPES) ×3
DRAPE SLUSH/WARMER DISC (DRAPES) ×3 IMPLANT
DRAPE SRG 135X102X78XABS (DRAPES) ×2 IMPLANT
DRSG COVADERM 4X14 (GAUZE/BANDAGES/DRESSINGS) ×3 IMPLANT
ELECT CAUTERY BLADE 6.4 (BLADE) ×3 IMPLANT
ELECT REM PT RETURN 9FT ADLT (ELECTROSURGICAL) ×6
ELECTRODE REM PT RTRN 9FT ADLT (ELECTROSURGICAL) ×4 IMPLANT
FELT TEFLON 1X6 (MISCELLANEOUS) ×6 IMPLANT
GAUZE SPONGE 4X4 12PLY STRL (GAUZE/BANDAGES/DRESSINGS) ×6 IMPLANT
GLOVE BIO SURGEON STRL SZ 6 (GLOVE) ×9 IMPLANT
GLOVE BIO SURGEON STRL SZ 6.5 (GLOVE) ×9 IMPLANT
GLOVE BIO SURGEON STRL SZ7 (GLOVE) IMPLANT
GLOVE BIO SURGEON STRL SZ7.5 (GLOVE) ×9 IMPLANT
GLOVE BIOGEL PI IND STRL 6 (GLOVE) ×4 IMPLANT
GLOVE BIOGEL PI IND STRL 6.5 (GLOVE) ×2 IMPLANT
GLOVE BIOGEL PI IND STRL 7.0 (GLOVE) IMPLANT
GLOVE BIOGEL PI IND STRL 9 (GLOVE) ×2 IMPLANT
GLOVE BIOGEL PI INDICATOR 6 (GLOVE) ×2
GLOVE BIOGEL PI INDICATOR 6.5 (GLOVE) ×1
GLOVE BIOGEL PI INDICATOR 7.0 (GLOVE)
GLOVE BIOGEL PI INDICATOR 9 (GLOVE) ×1
GLOVE ORTHO TXT STRL SZ7.5 (GLOVE) IMPLANT
GLOVE TRIUMPH SURG SIZE 7.0 (KITS) ×6 IMPLANT
GOWN STRL REIN XL XLG (GOWN DISPOSABLE) ×3 IMPLANT
GOWN STRL REUS W/ TWL LRG LVL3 (GOWN DISPOSABLE) ×14 IMPLANT
GOWN STRL REUS W/TWL LRG LVL3 (GOWN DISPOSABLE) ×21
HEMOSTAT POWDER SURGIFOAM 1G (HEMOSTASIS) ×9 IMPLANT
HEMOSTAT SURGICEL 2X14 (HEMOSTASIS) ×3 IMPLANT
INSERT FOGARTY 61MM (MISCELLANEOUS) IMPLANT
INSERT FOGARTY XLG (MISCELLANEOUS) ×3 IMPLANT
KIT BASIN OR (CUSTOM PROCEDURE TRAY) ×3 IMPLANT
KIT CATH CPB BARTLE (MISCELLANEOUS) ×3 IMPLANT
KIT SUCTION CATH 14FR (SUCTIONS) ×6 IMPLANT
KIT TURNOVER KIT B (KITS) ×3 IMPLANT
KIT VASOVIEW HEMOPRO 2 VH 4000 (KITS) ×3 IMPLANT
LINE VENT (MISCELLANEOUS) ×3 IMPLANT
NS IRRIG 1000ML POUR BTL (IV SOLUTION) ×18 IMPLANT
PACK E OPEN HEART (SUTURE) ×3 IMPLANT
PACK OPEN HEART (CUSTOM PROCEDURE TRAY) ×3 IMPLANT
PAD ARMBOARD 7.5X6 YLW CONV (MISCELLANEOUS) ×6 IMPLANT
PAD ELECT DEFIB RADIOL ZOLL (MISCELLANEOUS) ×3 IMPLANT
PENCIL BUTTON HOLSTER BLD 10FT (ELECTRODE) ×3 IMPLANT
POSITIONER HEAD DONUT 9IN (MISCELLANEOUS) ×3 IMPLANT
PUNCH AORTIC ROTATE 4.0MM (MISCELLANEOUS) IMPLANT
PUNCH AORTIC ROTATE 4.5MM 8IN (MISCELLANEOUS) ×6 IMPLANT
PUNCH AORTIC ROTATE 5MM 8IN (MISCELLANEOUS) IMPLANT
SET CARDIOPLEGIA MPS 5001102 (MISCELLANEOUS) ×3 IMPLANT
SPONGE INTESTINAL PEANUT (DISPOSABLE) IMPLANT
SPONGE LAP 18X18 RF (DISPOSABLE) IMPLANT
SPONGE LAP 18X18 X RAY DECT (DISPOSABLE) ×6 IMPLANT
SPONGE LAP 4X18 RFD (DISPOSABLE) ×3 IMPLANT
SUPPORT HEART JANKE-BARRON (MISCELLANEOUS) ×3 IMPLANT
SUT BONE WAX W31G (SUTURE) ×3 IMPLANT
SUT ETHIBON 2 0 V 52N 30 (SUTURE) ×6 IMPLANT
SUT ETHIBON EXCEL 2-0 V-5 (SUTURE) ×6 IMPLANT
SUT ETHIBOND 2 0 SH (SUTURE) ×3
SUT ETHIBOND 2 0 SH 36X2 (SUTURE) ×2 IMPLANT
SUT ETHIBOND V-5 VALVE (SUTURE) ×6 IMPLANT
SUT MNCRL AB 4-0 PS2 18 (SUTURE) IMPLANT
SUT PROLENE 3 0 SH DA (SUTURE) ×3 IMPLANT
SUT PROLENE 3 0 SH1 36 (SUTURE) ×3 IMPLANT
SUT PROLENE 4 0 RB 1 (SUTURE) ×12
SUT PROLENE 4 0 SH DA (SUTURE) IMPLANT
SUT PROLENE 4-0 RB1 .5 CRCL 36 (SUTURE) ×8 IMPLANT
SUT PROLENE 5 0 C 1 36 (SUTURE) IMPLANT
SUT PROLENE 6 0 C 1 30 (SUTURE) ×9 IMPLANT
SUT PROLENE 7 0 BV 1 (SUTURE) IMPLANT
SUT PROLENE 7 0 BV1 MDA (SUTURE) ×6 IMPLANT
SUT PROLENE 8 0 BV175 6 (SUTURE) IMPLANT
SUT SILK  1 MH (SUTURE)
SUT SILK 1 MH (SUTURE) IMPLANT
SUT SILK 2 0 SH (SUTURE) IMPLANT
SUT SILK 2 0 SH CR/8 (SUTURE) ×3 IMPLANT
SUT STEEL 6MS V (SUTURE) IMPLANT
SUT STEEL STERNAL CCS#1 18IN (SUTURE) IMPLANT
SUT STEEL SZ 6 DBL 3X14 BALL (SUTURE) ×9 IMPLANT
SUT VIC AB 1 CTX 36 (SUTURE) ×9
SUT VIC AB 1 CTX36XBRD ANBCTR (SUTURE) ×6 IMPLANT
SUT VIC AB 2-0 CT1 27 (SUTURE) ×3
SUT VIC AB 2-0 CT1 TAPERPNT 27 (SUTURE) ×2 IMPLANT
SUT VIC AB 2-0 CTX 27 (SUTURE) IMPLANT
SUT VIC AB 3-0 SH 27 (SUTURE)
SUT VIC AB 3-0 SH 27X BRD (SUTURE) IMPLANT
SUT VIC AB 3-0 X1 27 (SUTURE) ×3 IMPLANT
SUT VICRYL 4-0 PS2 18IN ABS (SUTURE) IMPLANT
SYSTEM SAHARA CHEST DRAIN ATS (WOUND CARE) ×3 IMPLANT
TAPE CLOTH SURG 4X10 WHT LF (GAUZE/BANDAGES/DRESSINGS) ×3 IMPLANT
TAPE PAPER 2X10 WHT MICROPORE (GAUZE/BANDAGES/DRESSINGS) ×3 IMPLANT
TOWEL GREEN STERILE (TOWEL DISPOSABLE) ×3 IMPLANT
TOWEL GREEN STERILE FF (TOWEL DISPOSABLE) ×3 IMPLANT
TRAY FOLEY SLVR 14FR TEMP STAT (SET/KITS/TRAYS/PACK) ×3 IMPLANT
TRAY FOLEY SLVR 16FR TEMP STAT (SET/KITS/TRAYS/PACK) IMPLANT
TUBING LAP HI FLOW INSUFFLATIO (TUBING) ×3 IMPLANT
UNDERPAD 30X36 HEAVY ABSORB (UNDERPADS AND DIAPERS) ×3 IMPLANT
VALVE AORTIC SZ25 INSP/RESIL (Prosthesis & Implant Heart) ×3 IMPLANT
VENT LEFT HEART 12002 (CATHETERS) ×3
WATER STERILE IRR 1000ML POUR (IV SOLUTION) ×6 IMPLANT

## 2020-08-26 NOTE — Procedures (Signed)
Extubation Procedure Note  Patient Details:   Name: Anthony Hartman DOB: May 03, 1941 MRN: [id number]   Airway Documentation:    Vent end date: 08/26/20 Vent end time: 2155   Evaluation  O2 sats: stable throughout Complications: No apparent complications Patient did tolerate procedure well. Bilateral Breath Sounds: Clear, Diminished   Yes   Pt extubated to 4L [place] per protocol.  NIF: -32 VC:1.9L  IS:163ml.  Positive cuff leak, no stridor noted.  Pt tolerated well.   Clance Boll 08/26/2020, 10:02 PM

## 2020-08-26 NOTE — Anesthesia Procedure Notes (Signed)
Arterial Line Insertion Start/End10/15/2021 6:55 AM, 08/26/2020 7:07 AM Performed by: Belinda Block, MD, Reece Agar, CRNA, CRNA  Patient location: Pre-op. Preanesthetic checklist: patient identified, IV checked, site marked, risks and benefits discussed, surgical consent, monitors and equipment checked, pre-op evaluation, timeout performed and anesthesia consent Lidocaine 1% used for infiltration Left, radial was placed Catheter size: 20 G Hand hygiene performed  and maximum sterile barriers used   Attempts: 1 Procedure performed without using ultrasound guided technique. Following insertion, dressing applied and Biopatch. Post procedure assessment: normal and unchanged  Patient tolerated the procedure well with no immediate complications.

## 2020-08-26 NOTE — Anesthesia Procedure Notes (Signed)
Procedure Name: Intubation Date/Time: 08/26/2020 7:52 AM Performed by: Reece Agar, CRNA Pre-anesthesia Checklist: Patient identified, Emergency Drugs available, Suction available and Patient being monitored Patient Re-evaluated:Patient Re-evaluated prior to induction Oxygen Delivery Method: Circle System Utilized Preoxygenation: Pre-oxygenation with 100% oxygen Induction Type: IV induction Ventilation: Mask ventilation without difficulty and Oral airway inserted - appropriate to patient size Laryngoscope Size: Glidescope and 4 Grade View: Grade I Tube type: Oral Tube size: 8.0 mm Number of attempts: 1 Airway Equipment and Method: Stylet and Oral airway Placement Confirmation: ETT inserted through vocal cords under direct vision,  positive ETCO2 and breath sounds checked- equal and bilateral Secured at: 22 cm Tube secured with: Tape Dental Injury: Teeth and Oropharynx as per pre-operative assessment

## 2020-08-26 NOTE — Transfer of Care (Signed)
Immediate Anesthesia Transfer of Care Note  Patient: Anthony Hartman  Procedure(s) Performed: CORONARY ARTERY BYPASS GRAFTING (CABG) TIMES FIVE, USING LEFT INTERNAL MAMMARY ARTERY AND RIGHT GREATER SAPHENOUS VEIN HARVESTED ENDOSCOPICALLY (N/A Chest) AORTIC VALVE REPLACEMENT (AVR) USING INSPIRIS VALVE SIZE 25MM (N/A Chest) TRANSESOPHAGEAL ECHOCARDIOGRAM (TEE) (N/A )  Patient Location: ICU  Anesthesia Type:General  Level of Consciousness: Patient remains intubated per anesthesia plan  Airway & Oxygen Therapy: Patient remains intubated per anesthesia plan and Patient placed on Ventilator (see vital sign flow sheet for setting)  Post-op Assessment: Report given to RN and Post -op Vital signs reviewed and stable  Post vital signs: Reviewed and stable  Last Vitals:  Vitals Value Taken Time  BP 149/80 08/26/20  1555  Temp 35.6 C 08/26/20 1555  Pulse 90 08/26/20 1555  Resp 12 08/26/20 1555  SpO2 98 % 08/26/20 1555  Vitals shown include unvalidated device data.  Last Pain:  Vitals:   08/26/20 0412  TempSrc: Oral  PainSc:       Patients Stated Pain Goal: 0 (90/90/30 1499)  Complications: No complications documented.

## 2020-08-26 NOTE — Anesthesia Preprocedure Evaluation (Addendum)
Anesthesia Evaluation  Patient identified by MRN, date of birth, ID band Patient awake    Airway Mallampati: II  TM Distance: >3 FB     Dental   Pulmonary sleep apnea , former smoker,    breath sounds clear to auscultation       Cardiovascular hypertension, + angina + CAD and + Past MI  + Valvular Problems/Murmurs AS  Rhythm:Regular Rate:Normal + Systolic murmurs    Neuro/Psych Anxiety Depression    GI/Hepatic Neg liver ROS, GERD  ,  Endo/Other  negative endocrine ROS  Renal/GU negative Renal ROS     Musculoskeletal   Abdominal   Peds  Hematology   Anesthesia Other Findings   Reproductive/Obstetrics                            Anesthesia Physical Anesthesia Plan  ASA: III  Anesthesia Plan: General   Post-op Pain Management:    Induction: Intravenous  PONV Risk Score and Plan: 3 and Ondansetron and Midazolam  Airway Management Planned: Oral ETT  Additional Equipment: Arterial line, PA Cath, Ultrasound Guidance Line Placement and TEE  Intra-op Plan:   Post-operative Plan: Post-operative intubation/ventilation  Informed Consent: I have reviewed the patients History and Physical, chart, labs and discussed the procedure including the risks, benefits and alternatives for the proposed anesthesia with the patient or authorized representative who has indicated his/her understanding and acceptance.     Dental advisory given  Plan Discussed with: CRNA and Anesthesiologist  Anesthesia Plan Comments:        Anesthesia Quick Evaluation

## 2020-08-26 NOTE — Brief Op Note (Signed)
08/22/2020 - 08/26/2020  8:54 AM  PATIENT:  Anthony Hartman  79 y.o. male  PRE-OPERATIVE DIAGNOSIS:  SEVERE AORTIC STENOSIS CORONARY ARTERY DISEASE  POST-OPERATIVE DIAGNOSIS:  SEVERE AORTIC STENOSIS CORONARY ARTERY DISEASE  PROCEDURE:  Procedure(s): CORONARY ARTERY BYPASS GRAFTING (CABG) TIMES FIVE, USING LEFT INTERNAL MAMMARY ARTERY AND RIGHT GREATER SAPHENOUS VEIN HARVESTED ENDOSCOPICALLY (N/A) AORTIC VALVE REPLACEMENT (AVR) USING INSPIRIS VALVE SIZE 25MM (N/A) TRANSESOPHAGEAL ECHOCARDIOGRAM (TEE) (N/A) LIMA-LAD SEQ SVG-OM1-OM2 SVG-DIAG SVG-PD  EVH 60 MIN SURGEON:  Surgeon(s) and Role:    * Bartle, Fernande Boyden, MD - Primary  PHYSICIAN ASSISTANT: Briceida Rasberry PA-C  ASSISTANTS:STAFF  ANESTHESIA:   general  EBL:  1800 mL   BLOOD ADMINISTERED:none  DRAINS: LEFT PLEURAL AND MEDIASTINAL CHEST TUBES   LOCAL MEDICATIONS USED:  NONE  SPECIMEN:  Source of Specimen:  AORTIC VALVE LEAFLETS  DISPOSITION OF SPECIMEN:  PATHOLOGY  COUNTS:  YES  TOURNIQUET:  * No tourniquets in log *  DICTATION: .Dragon Dictation  PLAN OF CARE: Admit to inpatient   PATIENT DISPOSITION:  ICU - intubated and hemodynamically stable.   Delay start of Pharmacological VTE agent (>24hrs) due to surgical blood loss or risk of bleeding: yes

## 2020-08-26 NOTE — Op Note (Signed)
CARDIOVASCULAR SURGERY OPERATIVE NOTE  08/26/2020  Surgeon:  Gaye Pollack, MD  First Assistant: Jadene Pierini,  PA-C   Preoperative Diagnosis:  Severe multi-vessel coronary artery disease, severe aortic stenosis and moderate insufficiency.   Postoperative Diagnosis:  Same   Procedure:  1. Median Sternotomy 2. Extracorporeal circulation 3.   Coronary artery bypass grafting x 5   Left internal mammary artery graft to the LAD  SVG to diagonal  Sequential SVG to OM1 and OM2  SVG to PDA. 4.   Endoscopic vein harvest from the right leg 5.   Aortic valve replacement using a 25 mm Edwards INSPIRIS RESILIA Pericardial valve.   Anesthesia:  General Endotracheal   Clinical History/Surgical Indication:  He has stage D, severe, symptomatic aortic stenosis with New York Heart Association class IV symptoms of chest pain at rest and ruled in for non-ST segment elevation MI.  His 2D echocardiogram shows a severely calcified aortic valve with restricted leaflet mobility and a mean gradient of 46 mmHg consistent with severe aortic stenosis.  There is also moderate aortic insufficiency.  Left ventricular ejection fraction is 50 to 55% with moderate LVH.  His cardiac catheterization shows severe three-vessel coronary disease.  I think that coronary bypass graft surgery and aortic valve replacement is the best option for this patient. I discussed the operative procedure with the patient and his wife including alternatives, benefits and risks; including but not limited to bleeding, blood transfusion, infection, stroke, myocardial infarction, graft failure, heart block requiring a permanent pacemaker, organ dysfunction, and death.  [name] understands and agrees to proceed.   Preparation:  The patient was seen in the preoperative holding area and the correct patient, correct operation were confirmed  with the patient after reviewing the medical record and catheterization. The consent was signed by me. Preoperative antibiotics were given. A pulmonary arterial line and radial arterial line were placed by the anesthesia team. The patient was taken back to the operating room and positioned supine on the operating room table. After being placed under general endotracheal anesthesia by the anesthesia team a foley catheter was placed. The neck, chest, abdomen, and both legs were prepped with betadine soap and solution and draped in the usual sterile manner. A surgical time-out was taken and the correct patient and operative procedure were confirmed with the nursing and anesthesia staff.   Cardiopulmonary Bypass:  A median sternotomy was performed. The pericardium was opened in the midline. Right ventricular function appeared normal. The ascending aorta was of normal size and had no palpable plaque. There were no contraindications to aortic cannulation or cross-clamping. The patient was fully systemically heparinized and the ACT was maintained > 400 sec. The proximal aortic arch was cannulated with a 50 F aortic cannula for arterial inflow. Venous cannulation was performed via the right atrial appendage using a two-staged venous cannula. An antegrade cardioplegia/vent cannula was inserted into the mid-ascending aorta. A retrograde cardioplegia cannula was placed in the coronary sinus via the right atrium. A left ventricular vent was placed via the right superior pulmonary vein. Aortic occlusion was performed with a single cross-clamp. Systemic cooling to 32 degrees Centigrade and topical cooling of the heart with iced saline were used. Hyperkalemic antegrade cold blood cardioplegia was used to induce diastolic arrest and was then given at about 20 minute intervals throughout the period of arrest to maintain myocardial temperature at or below 10 degrees centigrade. Cold blood retrograde cardioplegia was used during  the valve replacement. A temperature probe  was inserted into the interventricular septum and an insulating pad was placed in the pericardium.    Left internal mammary artery harvest:  The left side of the sternum was retracted using the Rultract retractor. The left internal mammary artery was harvested as a pedicle graft. All side branches were clipped. It was a large-sized vessel of good quality with excellent blood flow. It was ligated distally and divided. It was sprayed with topical papaverine solution to prevent vasospasm.   Endoscopic vein harvest:  The right greater saphenous vein was harvested endoscopically through a 2 cm incision medial to the right knee. It was harvested from the upper thigh to below the knee. It was a medium-sized vein of good quality. The side branches were all ligated with 4-0 silk ties.    Coronary arteries:  The coronary arteries were examined.   LAD:  Large vessel with diffuse proximal and mid vessel disease. The distal vessel was graftable. Diagonal branch was medium caliber vessel with no distal disease.  LCX:  OM1 and OM2 were medium caliber vessels with no distal disease. The OM2 was grafted because there appeared to be a significant ostial LCX stenosis on cath by my evaluation.  RCA:  Severely and diffusely diseased. The PDA and PL were medium sized vessels with no distal disease.   Grafts:  1. LIMA to the LAD: 2.0 mm. It was sewn end to side using 8-0 prolene continuous suture. 2. SVG to diagonal:  1.75 mm. It was sewn end to side using 7-0 prolene continuous suture. 3. Sequential SVG to OM1:  1.6 mm. It was sewn sequential side to side using 7-0 prolene continuous suture. 4.   Sequential SVG to OM2:  1.6 mm. It was sewn sequential end to side using 7-0 prolene continuous suture. 5.   SVG to PDA:  1.6 mm. It was sewn end to side using 7-0 prolene continuous suture.  The proximal vein graft anastomoses were performed to the mid-ascending aorta  using continuous 6-0 prolene suture. Graft markers were placed around the proximal anastomoses.  Aortic Valve Replacement:   A transverse aortotomy was performed 1 cm above the take-off of the right coronary artery. The native valve was tricuspid with calcified leaflets and severe annular calcification. The ostia of the coronary arteries were in normal position and were not obstructed. The native valve leaflets were excised and the annulus was decalcified with rongeurs. Care was taken to remove all particulate debris. The left ventricle was directly inspected for debris and then irrigated with ice saline solution. The annulus was sized and a size 25 mm Edwards INSPIRIS RESILIA pericardial valve was chosen. The model number was [id number] and the serial number was [id number]. While the valve was being prepared 2-0 Ethibond pledgeted horizontal mattress sutures were placed around the annulus with the pledgets in a sub-annular position. The sutures were placed through the sewing ring and the valve lowered into place. The sutures were tied sequentially. The valve seated nicely and the coronary ostia were not obstructed. The prosthetic valve leaflets moved normally and there was no sub-valvular obstruction. The aortotomy was closed using 4-0 Prolene suture in 2 layers with felt strips to reinforce the closure.  Completion:  The patient was rewarmed to 37 degrees Centigrade. The clamp was removed from the LIMA pedicle and there was rapid warming of the septum and return of ventricular fibrillation. The crossclamp was removed with a time of 209 minutes. There was spontaneous return of slow junctional rhythm. The distal and proximal  anastomoses were checked for hemostasis. The position of the grafts was satisfactory. Two temporary epicardial pacing wires were placed on the right atrium and two on the right ventricle. The patient was weaned from CPB without difficulty on no inotropes. CPB time was 239 minutes. Cardiac  output was 5 LPM. TEE showed a normally functioning aortic valve prosthesis with no paravalvular leak. LV systolic function was normal. Heparin was fully reversed with protamine and the aortic and venous cannulas removed. Hemostasis was achieved. Mediastinal and left pleural drainage tubes were placed. The sternum was closed with double #6 stainless steel wires. The fascia was closed with continuous # 1 vicryl suture. The subcutaneous tissue was closed with 2-0 vicryl continuous suture. The skin was closed with 3-0 vicryl subcuticular suture. All sponge, needle, and instrument counts were reported correct at the end of the case. Dry sterile dressings were placed over the incisions and around the chest tubes which were connected to pleurevac suction. The patient was then transported to the surgical intensive care unit in stable condition.

## 2020-08-26 NOTE — Anesthesia Procedure Notes (Signed)
Central Venous Catheter Insertion Performed by: Roderic Palau, MD, anesthesiologist Start/End10/15/2021 7:05 AM, 08/26/2020 7:20 AM Patient location: Pre-op. Preanesthetic checklist: patient identified, IV checked, site marked, risks and benefits discussed, surgical consent, monitors and equipment checked, pre-op evaluation, timeout performed and anesthesia consent Hand hygiene performed  and maximum sterile barriers used  PA cath was placed.Swan type:thermodilution PA Cath depth:50 Procedure performed without using ultrasound guided technique. Attempts: 1 Patient tolerated the procedure well with no immediate complications.

## 2020-08-26 NOTE — Anesthesia Procedure Notes (Signed)
Central Venous Catheter Insertion Performed by: Roderic Palau, MD, anesthesiologist Start/End10/15/2021 7:05 AM, 08/26/2020 7:20 AM Patient location: Pre-op. Preanesthetic checklist: patient identified, IV checked, site marked, risks and benefits discussed, surgical consent, monitors and equipment checked, pre-op evaluation, timeout performed and anesthesia consent Position: Trendelenburg Lidocaine 1% used for infiltration and patient sedated Hand hygiene performed , maximum sterile barriers used  and Seldinger technique used Catheter size: 9 Fr Total catheter length 10. Central line was placed.MAC introducer Procedure performed using ultrasound guided technique. Ultrasound Notes:anatomy identified, needle tip was noted to be adjacent to the nerve/plexus identified, no ultrasound evidence of intravascular and/or intraneural injection and image(s) printed for medical record Attempts: 1 Following insertion, line sutured, dressing applied and Biopatch. Post procedure assessment: blood return through all ports, free fluid flow and no air  Patient tolerated the procedure well with no immediate complications.

## 2020-08-26 NOTE — Progress Notes (Signed)
EVENING ROUNDS NOTE :     [address]       [place] [address]             [phone]                 Day of Surgery Procedure(s) (LRB): CORONARY ARTERY BYPASS GRAFTING (CABG) TIMES FIVE, USING LEFT INTERNAL MAMMARY ARTERY AND RIGHT GREATER SAPHENOUS VEIN HARVESTED ENDOSCOPICALLY (N/A) AORTIC VALVE REPLACEMENT (AVR) USING INSPIRIS VALVE SIZE 25MM (N/A) TRANSESOPHAGEAL ECHOCARDIOGRAM (TEE) (N/A)   Total Length of Stay:  LOS: 4 days  Events:   Off gtts Good hemodynamics Low CT output Weaning to extubation    BP 112/75   Pulse 80   Temp (!) 96.8 F (36 C)   Resp 16   Ht 6\' 4"  (1.93 m)   Wt 103.1 kg   SpO2 100%   BMI 27.66 kg/m   PAP: (20-36)/(10-16) 21/13 CO:  [4.7 L/min-5.4 L/min] 4.7 L/min CI:  [2 L/min/m2-2.3 L/min/m2] 2 L/min/m2  Vent Mode: SIMV;PRVC;PSV FiO2 (%):  [50 %-80 %] 60 % Set Rate:  [12 bmp-16 bmp] 16 bmp Vt Set:  [690 mL] 690 mL PEEP:  [5 cmH20] 5 cmH20 Pressure Support:  [10 cmH20] 10 cmH20 Plateau Pressure:  [16 cmH20] 16 cmH20  . sodium chloride Stopped (08/26/20 1651)  . [START ON 08/27/2020] sodium chloride    . sodium chloride 10 mL/hr at 08/26/20 1600  . albumin human 12.5 g (08/26/20 1638)  . dexmedetomidine (PRECEDEX) IV infusion 0.5 mcg/kg/hr (08/26/20 1700)  . famotidine (PEPCID) IV 20 mg (08/26/20 1600)  . insulin 1.3 mL/hr at 08/26/20 1700  . lactated ringers    . lactated ringers    . lactated ringers 20 mL/hr at 08/26/20 1700  . [START ON 08/27/2020] levofloxacin (LEVAQUIN) IV    . magnesium sulfate 20 mL/hr at 08/26/20 1700  . nitroGLYCERIN Stopped (08/26/20 1633)  . norepinephrine (LEVOPHED) Adult infusion 0 mcg/min (08/26/20 1600)  . phenylephrine (NEO-SYNEPHRINE) Adult infusion Stopped (08/26/20 1601)  . potassium chloride 50 mL/hr at 08/26/20 1700  . vancomycin      I/O last 3 completed shifts: In: 995.8 [P.O.:340; I.V.:655.8] Out: 2300 [Urine:2300]   CBC Latest Ref Rng & Units 08/26/2020  08/26/2020 08/26/2020  WBC 4.0 - 10.5 K/uL 8.0 - -  Hemoglobin 13.0 - 17.0 g/dL 15.6 16.0 14.6  Hematocrit 39 - 52 % 46.2 47.0 43.0  Platelets 150 - 400 K/uL 97(L) - -    BMP Latest Ref Rng & Units 08/26/2020 08/26/2020 08/26/2020  Glucose 70 - 99 mg/dL - - 134(H)  BUN 8 - 23 mg/dL - - 18  Creatinine 0.61 - 1.24 mg/dL - - 1.10  BUN/Creat Ratio 10 - 24 - - -  Sodium 135 - 145 mmol/L 139 138 137  Potassium 3.5 - 5.1 mmol/L 3.9 3.9 4.4  Chloride 98 - 111 mmol/L - - 100  CO2 22 - 32 mmol/L - - -  Calcium 8.9 - 10.3 mg/dL - - -    ABG    Component Value Date/Time   PHART 7.298 (L) 08/26/2020 1525   PCO2ART 50.1 (H) 08/26/2020 1525   PO2ART 81 (L) 08/26/2020 1525   HCO3 24.5 08/26/2020 1525   TCO2 26 08/26/2020 1525   ACIDBASEDEF 3.0 (H) 08/26/2020 1525   O2SAT 94.0 08/26/2020 1525       Melodie Bouillon, MD 08/26/2020 5:28 PM

## 2020-08-26 NOTE — Progress Notes (Signed)
  Echocardiogram Echocardiogram Transesophageal has been performed.  Anthony Hartman 08/26/2020, 9:14 AM

## 2020-08-27 ENCOUNTER — Inpatient Hospital Stay (HOSPITAL_COMMUNITY): Payer: PPO

## 2020-08-27 DIAGNOSIS — I350 Nonrheumatic aortic (valve) stenosis: Secondary | ICD-10-CM | POA: Diagnosis not present

## 2020-08-27 DIAGNOSIS — E785 Hyperlipidemia, unspecified: Secondary | ICD-10-CM | POA: Diagnosis not present

## 2020-08-27 DIAGNOSIS — I10 Essential (primary) hypertension: Secondary | ICD-10-CM

## 2020-08-27 DIAGNOSIS — I200 Unstable angina: Secondary | ICD-10-CM | POA: Diagnosis not present

## 2020-08-27 LAB — GLUCOSE, CAPILLARY
Glucose-Capillary: 131 mg/dL — ABNORMAL HIGH (ref 70–99)
Glucose-Capillary: 127 mg/dL — ABNORMAL HIGH (ref 70–99)
Glucose-Capillary: 113 mg/dL — ABNORMAL HIGH (ref 70–99)
Glucose-Capillary: 111 mg/dL — ABNORMAL HIGH (ref 70–99)
Glucose-Capillary: 183 mg/dL — ABNORMAL HIGH (ref 70–99)
Glucose-Capillary: 182 mg/dL — ABNORMAL HIGH (ref 70–99)
Glucose-Capillary: 192 mg/dL — ABNORMAL HIGH (ref 70–99)
Glucose-Capillary: 142 mg/dL — ABNORMAL HIGH (ref 70–99)
Glucose-Capillary: 112 mg/dL — ABNORMAL HIGH (ref 70–99)
Glucose-Capillary: 140 mg/dL — ABNORMAL HIGH (ref 70–99)

## 2020-08-27 LAB — BASIC METABOLIC PANEL
Anion gap: 8 (ref 5–15)
Anion gap: 6 (ref 5–15)
BUN: 20 mg/dL (ref 8–23)
BUN: 21 mg/dL (ref 8–23)
CO2: 23 mmol/L (ref 22–32)
CO2: 25 mmol/L (ref 22–32)
Calcium: 8.2 mg/dL — ABNORMAL LOW (ref 8.9–10.3)
Calcium: 8.3 mg/dL — ABNORMAL LOW (ref 8.9–10.3)
Chloride: 103 mmol/L (ref 98–111)
Chloride: 100 mmol/L (ref 98–111)
Creatinine, Ser: 1.36 mg/dL — ABNORMAL HIGH (ref 0.61–1.24)
Creatinine, Ser: 1.45 mg/dL — ABNORMAL HIGH (ref 0.61–1.24)
GFR, Estimated: 49 mL/min — ABNORMAL LOW (ref >60)
GFR, Estimated: 45 mL/min — ABNORMAL LOW (ref >60)
Glucose, Bld: 108 mg/dL — ABNORMAL HIGH (ref 70–99)
Glucose, Bld: 169 mg/dL — ABNORMAL HIGH (ref 70–99)
Potassium: 4.5 mmol/L (ref 3.5–5.1)
Potassium: 4.4 mmol/L (ref 3.5–5.1)
Sodium: 134 mmol/L — ABNORMAL LOW (ref 135–145)
Sodium: 131 mmol/L — ABNORMAL LOW (ref 135–145)

## 2020-08-27 LAB — CBC
HCT: 42.1 % (ref 39.0–52.0)
HCT: 43.4 % (ref 39.0–52.0)
Hemoglobin: 13.8 g/dL (ref 13.0–17.0)
Hemoglobin: 14.1 g/dL (ref 13.0–17.0)
MCH: 32.6 pg (ref 26.0–34.0)
MCH: 32.6 pg (ref 26.0–34.0)
MCHC: 32.8 g/dL (ref 30.0–36.0)
MCHC: 32.5 g/dL (ref 30.0–36.0)
MCV: 99.5 fL (ref 80.0–100.0)
MCV: 100.2 fL — ABNORMAL HIGH (ref 80.0–100.0)
Platelets: 118 10*3/uL — ABNORMAL LOW (ref 150–400)
Platelets: 127 10*3/uL — ABNORMAL LOW (ref 150–400)
RBC: 4.23 MIL/uL (ref 4.22–5.81)
RBC: 4.33 MIL/uL (ref 4.22–5.81)
RDW: 13.1 % (ref 11.5–15.5)
RDW: 13.2 % (ref 11.5–15.5)
WBC: 10.8 10*3/uL — ABNORMAL HIGH (ref 4.0–10.5)
WBC: 9.7 10*3/uL (ref 4.0–10.5)
nRBC: 0 % (ref 0.0–0.2)
nRBC: 0 % (ref 0.0–0.2)

## 2020-08-27 LAB — MAGNESIUM
Magnesium: 2.2 mg/dL (ref 1.7–2.4)
Magnesium: 2 mg/dL (ref 1.7–2.4)

## 2020-08-27 LAB — ECHO INTRAOPERATIVE TEE
Height: 76 in
Weight: 3635.2 oz

## 2020-08-27 LAB — CREATININE, SERUM
Creatinine, Ser: 1.4 mg/dL — ABNORMAL HIGH (ref 0.61–1.24)
GFR, Estimated: 47 mL/min — ABNORMAL LOW (ref >60)

## 2020-08-27 LAB — PREPARE PLATELET PHERESIS: Unit division: 0

## 2020-08-27 LAB — BPAM PLATELET PHERESIS
Blood Product Expiration Date: 202110182359
ISSUE DATE / TIME: 202110151325
Unit Type and Rh: 8400

## 2020-08-27 MED ORDER — ORAL CARE MOUTH RINSE
15.0000 mL | Freq: Two times a day (BID) | OROMUCOSAL | Status: DC
  Administered 2020-08-27 – 2020-08-30 (×7): 15 mL via OROMUCOSAL

## 2020-08-27 MED ORDER — ENOXAPARIN SODIUM 40 MG/0.4ML ~~LOC~~ SOLN
40.0000 mg | Freq: Every day | SUBCUTANEOUS | Status: DC
  Administered 2020-08-27 – 2020-08-30 (×4): 40 mg via SUBCUTANEOUS
  Filled 2020-08-27 (×4): qty 0.4

## 2020-08-27 MED ORDER — INSULIN ASPART 100 UNIT/ML ~~LOC~~ SOLN
0.0000 [IU] | SUBCUTANEOUS | Status: DC
  Administered 2020-08-27 – 2020-08-28 (×4): 2 [IU] via SUBCUTANEOUS

## 2020-08-27 MED ORDER — ALPRAZOLAM 0.5 MG PO TABS
0.5000 mg | ORAL_TABLET | Freq: Every day | ORAL | Status: DC
  Administered 2020-08-27 – 2020-08-30 (×4): 0.5 mg via ORAL
  Filled 2020-08-27 (×4): qty 1

## 2020-08-27 NOTE — Anesthesia Postprocedure Evaluation (Signed)
Anesthesia Post Note  Patient: Anthony Hartman  Procedure(s) Performed: CORONARY ARTERY BYPASS GRAFTING (CABG) TIMES FIVE, USING LEFT INTERNAL MAMMARY ARTERY AND RIGHT GREATER SAPHENOUS VEIN HARVESTED ENDOSCOPICALLY (N/A Chest) AORTIC VALVE REPLACEMENT (AVR) USING INSPIRIS VALVE SIZE 25MM (N/A Chest) TRANSESOPHAGEAL ECHOCARDIOGRAM (TEE) (N/A )     Patient location during evaluation: SICU Anesthesia Type: General Level of consciousness: patient remains intubated per anesthesia plan Pain management: pain level controlled Vital Signs Assessment: post-procedure vital signs reviewed and stable Respiratory status: patient remains intubated per anesthesia plan Cardiovascular status: stable Postop Assessment: no apparent nausea or vomiting Anesthetic complications: no   No complications documented.  Last Vitals:  Vitals:   08/27/20 0645 08/27/20 0700  BP:  123/68  Pulse: 79 76  Resp: 16 14  Temp: 37.1 C 37.1 C  SpO2: 97% 98%    Last Pain:  Vitals:   08/27/20 0500  TempSrc:   PainSc: 7                  Lou Loewe

## 2020-08-27 NOTE — Progress Notes (Signed)
      [address]       [place] [address]             [phone]                 1 Day Post-Op Procedure(s) (LRB): CORONARY ARTERY BYPASS GRAFTING (CABG) TIMES FIVE, USING LEFT INTERNAL MAMMARY ARTERY AND RIGHT GREATER SAPHENOUS VEIN HARVESTED ENDOSCOPICALLY (N/A) AORTIC VALVE REPLACEMENT (AVR) USING INSPIRIS VALVE SIZE 25MM (N/A) TRANSESOPHAGEAL ECHOCARDIOGRAM (TEE) (N/A)   Events: Doing well _______________________________________________________________ Vitals: BP (!) 150/74   Pulse 80   Temp 99 F (37.2 C)   Resp 14   Ht 6\' 4"  (1.93 m)   Wt 109 kg   SpO2 99%   BMI 29.25 kg/m   - Neuro: alert NAD  - Cardiovascular: sinus  Drips: none.   PAP: (20-43)/(2-18) 29/12 CO:  [4.5 L/min-6.4 L/min] 6.4 L/min CI:  [1.9 L/min/m2-2.7 L/min/m2] 2.7 L/min/m2  - Pulm: EWOB  ABG    Component Value Date/Time   PHART 7.368 08/26/2020 2319   PCO2ART 41.8 08/26/2020 2319   PO2ART 76 (L) 08/26/2020 2319   HCO3 24.0 08/26/2020 2319   TCO2 25 08/26/2020 2319   ACIDBASEDEF 1.0 08/26/2020 2319   O2SAT 94.0 08/26/2020 2319    - Abd: soft - Extremity: warm  .Intake/Output      10/15 0701 - 10/16 0700 10/16 0701 - 10/17 0700   P.O. 340 720   I.V. (mL/kg) 2741.3 (25.1) 1.2 (0)   Blood 1293    IV Piggyback 1969.1    Total Intake(mL/kg) 6343.4 (58.2) 721.2 (6.6)   Urine (mL/kg/hr) 2610 (1) 380 (0.6)   Emesis/NG output 0    Blood 1800    Chest Tube 490 170   Total Output 4900 550   Net +1443.4 +171.2           _______________________________________________________________ Labs: CBC Latest Ref Rng & Units 08/27/2020 08/26/2020 08/26/2020  WBC 4.0 - 10.5 K/uL 10.8(H) - -  Hemoglobin 13.0 - 17.0 g/dL 13.8 15.0 15.3  Hematocrit 39 - 52 % 42.1 44.0 45.0  Platelets 150 - 400 K/uL 118(L) - -   CMP Latest Ref Rng & Units 08/27/2020 08/26/2020 08/26/2020  Glucose 70 - 99 mg/dL 108(H) - -  BUN 8 - 23 mg/dL 20 - -  Creatinine 0.61 - 1.24 mg/dL 1.36(H) - -   Sodium 135 - 145 mmol/L 134(L) 137 137  Potassium 3.5 - 5.1 mmol/L 4.5 4.7 5.1  Chloride 98 - 111 mmol/L 103 - -  CO2 22 - 32 mmol/L 23 - -  Calcium 8.9 - 10.3 mg/dL 8.2(L) - -  Total Protein 6.5 - 8.1 g/dL - - -  Total Bilirubin 0.3 - 1.2 mg/dL - - -  Alkaline Phos 38 - 126 U/L - - -  AST 15 - 41 U/L - - -  ALT 0 - 44 U/L - - -    CXR: stable  _______________________________________________________________  Assessment and Plan: POD 1 s/p CABG  Neuro: pain controlled CV: on A/S/BB.  Will remove lines  Pulm: pulm toilet Renal: creat stable GI: advancing diet Heme: stable ID: [email or web address] Endo: SSI Dispo: possible floor today  Melodie Bouillon, MD 08/27/2020 12:28 PM

## 2020-08-27 NOTE — Progress Notes (Signed)
Progress Note  Patient Name: Anthony Hartman Date of Encounter: 08/27/2020  [facility] Cardiologist: Minus Breeding, MD   Subjective   He is awake, alert, and oriented.  Significant chest soreness and discomfort.  Status post surgical aortic valve replacement and three-vessel coronary bypass graft with 5 distal targets.  LAD was treated with LIMA.  States the paresthesias in feet are no longer present and wonders if fixing his heart could make his neuropathy better.  Inpatient Medications    Scheduled Meds: . acetaminophen  1,000 mg Oral Q6H   Or  . acetaminophen (TYLENOL) oral liquid 160 mg/5 mL  1,000 mg Per Tube Q6H  . aspirin EC  325 mg Oral Daily   Or  . aspirin  324 mg Per Tube Daily  . bisacodyl  10 mg Oral Daily   Or  . bisacodyl  10 mg Rectal Daily  . chlorhexidine  15 mL Mouth Rinse BID  . chlorhexidine gluconate (MEDLINE KIT)  15 mL Mouth Rinse BID  . Chlorhexidine Gluconate Cloth  6 each Topical Daily  . Chlorhexidine Gluconate Cloth  6 each Topical Daily  . docusate sodium  200 mg Oral Daily  . mouth rinse  15 mL Mouth Rinse q12n4p  . metoprolol tartrate  12.5 mg Oral BID   Or  . metoprolol tartrate  12.5 mg Per Tube BID  . [START ON 08/28/2020] pantoprazole  40 mg Oral Daily  . sodium chloride flush  10-40 mL Intracatheter Q12H  . sodium chloride flush  3 mL Intravenous Q12H   Continuous Infusions: . sodium chloride Stopped (08/27/20 0542)  . sodium chloride    . sodium chloride 10 mL/hr at 08/26/20 1600  . albumin human 12.5 g (08/26/20 1638)  . dexmedetomidine (PRECEDEX) IV infusion Stopped (08/27/20 0457)  . insulin 0.6 mL/hr at 08/27/20 0700  . lactated ringers    . lactated ringers    . lactated ringers Stopped (08/27/20 0536)  . levofloxacin (LEVAQUIN) IV    . nitroGLYCERIN Stopped (08/26/20 1633)  . norepinephrine (LEVOPHED) Adult infusion 0 mcg/min (08/26/20 1600)  . phenylephrine (NEO-SYNEPHRINE) Adult infusion Stopped (08/26/20 1601)    PRN Meds: sodium chloride, albumin human, dextrose, lactated ringers, metoprolol tartrate, midazolam, morphine injection, ondansetron (ZOFRAN) IV, oxyCODONE, sodium chloride flush, sodium chloride flush, traMADol   Vital Signs    Vitals:   08/27/20 0639 08/27/20 0645 08/27/20 0700 08/27/20 0800  BP:   123/68   Pulse:  79 76 80  Resp:  _0 Temp:  98.8 F (37.1 C) 98.8 F (37.1 C) 98.4 F (36.9 C)  TempSrc:      SpO2:  97% 98% 96%  Weight: 109 kg     Height:        Intake/Output Summary (Last 24 hours) at 08/27/2020 0831 Last data filed at 08/27/2020 0700 Gross per 24 hour  Intake 5943.36 ml  Output 4900 ml  Net 1043.36 ml   Last 3 Weights 08/27/2020 08/26/2020 08/25/2020  Weight (lbs) 240 lb 4.8 oz 227 lb 3.2 oz 228 lb 14.4 oz  Weight (kg) 108.999 kg 103.057 kg 103.828 kg      Telemetry    Sinus- Personally Reviewed  ECG    No AM EKG- Personally Reviewed  Physical Exam   General: Awake and oriented HEENT: No jaundice SKIN: warm, dry. No rashes. Neuro: Able to move all extremities.  Alert. Neck: Central line in neck. Lungs:Clear lungs are clear anterior Cardiovascular: No aortic regurgitation is heard.  He does have a multicomponent pericardial friction rub. Abdomen: Soft and is nontender. Extremities: Edema in hands and feet.   Labs    High Sensitivity Troponin:   Recent Labs  Lab 08/22/20 0542 08/22/20 0921 08/22/20 1542  TROPONINIHS 247* 736* 1,625*      Chemistry Recent Labs  Lab 08/22/20 0542 08/22/20 0619 08/26/20 0405 08/26/20 0818 08/26/20 1415 08/26/20 1506 08/26/20 2109 08/26/20 2109 08/26/20 2154 08/26/20 2319 08/27/20 0456  NA 136   < > 134*   < > 137   < > 136   < > 137 137 134*  K 4.1   < > 4.0   < > 4.4   < > 5.4*   < > 5.1 4.7 4.5  CL 102   < > 101   < > 100  --  105  --   --   --  103  CO2 24   < > 25  --   --   --  23  --   --   --  23  GLUCOSE 103*   < > 93   < > 134*  --  141*  --   --   --  108*  BUN 19    < > 13   < > 18  --  18  --   --   --  20  CREATININE 1.51*   < > 1.31*   < > 1.10  --  1.30*  --   --   --  1.36*  CALCIUM 9.2   < > 9.1  --   --   --  8.3*  --   --   --  8.2*  PROT 5.5*  --   --   --   --   --   --   --   --   --   --   ALBUMIN 3.6  --   --   --   --   --   --   --   --   --   --   AST 30  --   --   --   --   --   --   --   --   --   --   ALT 24  --   --   --   --   --   --   --   --   --   --   ALKPHOS 35*  --   --   --   --   --   --   --   --   --   --   BILITOT 0.9  --   --   --   --   --   --   --   --   --   --   GFRNONAA 43*   < > 51*  --   --   --  52*  --   --   --  49*  ANIONGAP 10   < > 8  --   --   --  8  --   --   --  8   < > = values in this interval not displayed.     Hematology Recent Labs  Lab 08/26/20 1602 08/26/20 1607 08/26/20 2109 08/26/20 2109 08/26/20 2154 08/26/20 2319 08/27/20 0456  WBC 8.0  --  10.2  --   --   --  10.8*  RBC 4.63  --  4.60  --   --   --  4.23  HGB 15.6   < > 15.1   < > 15.3 15.0 13.8  HCT 46.2   < > 45.1   < > 45.0 44.0 42.1  MCV 99.8  --  98.0  --   --   --  99.5  MCH 33.7  --  32.8  --   --   --  32.6  MCHC 33.8  --  33.5  --   --   --  32.8  RDW 12.7  --  12.9  --   --   --  13.1  PLT 97*  --  134*  --   --   --  118*   < > = values in this interval not displayed.    BNPNo results for input(s): BNP, PROBNP in the last 168 hours.   DDimer No results for input(s): DDIMER in the last 168 hours.   Radiology    DG Chest Port 1 View  Result Date: 08/26/2020 CLINICAL DATA:  Status post aortic valve replacement and CABG today. EXAM: PORTABLE CHEST 1 VIEW COMPARISON:  Single-view of the chest 08/22/2020. FINDINGS: Endotracheal tube tip projects in good position at the level the clavicular heads. Right IJ approach Swan-Ganz catheter tip projects in the pulmonary outflow tract. NG tube tip is just within the stomach. Mediastinal drain and left chest tube noted. Small bilateral pleural effusions and basilar  atelectasis. No pneumothorax. No pulmonary edema. Heart size is normal. IMPRESSION: Support tubes and lines project in good position.  No pneumothorax. Mild bibasilar atelectasis and small effusions. Electronically Signed   By: Inge Rise M.D.   On: 08/26/2020 16:16    Cardiac Studies    Patient Profile     79 y.o. male with a PMH of CAD (first MI in 2012 with unsuccessful PCI attempt to OM1, s/p BMS to RCA in 2017, with subsequent PCI to 2nd diagonal 04/2019), severe AS, HTN, HLD, OSA, GERD, BPH, and bladder cancer, who presented with chest pain and found to have a NSTEMI. Cardiac cath revealed severe three-vessel CAD.  Now status post 5 vessel coronary bypass including LIMA to LAD and aortic valve replacement. Assessment & Plan    1.  Severe obstructive three-vessel coronary artery disease: He is status post multivessel bypass which also includes LIMA to LAD.  Aggressive secondary risk factor modification.  Continue aspirin. 2. Severe aortic stenosis: Status post aortic valve replacement using a 25 mm Edwards pericardial valve. 3. CVD risk modification: He will need to continue Repatha for LDL lowering less than 70 and preferably less than 55; smoking cessation is essential; over time, ultimate blood pressure target is 130/80 mmHg which should include renin angiotensin system blockade, and beta-blocker as tolerated. 4. Postoperative arrhythmia: No atrial fibrillation or significant arrhythmia to this point.  We will follow.     For questions or updates, please contact [facility] Please consult [email or web address] for contact info under        Signed, Sinclair Grooms, MD  08/27/2020, 8:31 AM

## 2020-08-28 ENCOUNTER — Inpatient Hospital Stay (HOSPITAL_COMMUNITY): Payer: PPO

## 2020-08-28 LAB — CBC
HCT: 42.3 % (ref 39.0–52.0)
Hemoglobin: 13.8 g/dL (ref 13.0–17.0)
MCH: 32.9 pg (ref 26.0–34.0)
MCHC: 32.6 g/dL (ref 30.0–36.0)
MCV: 100.7 fL — ABNORMAL HIGH (ref 80.0–100.0)
Platelets: 117 10*3/uL — ABNORMAL LOW (ref 150–400)
RBC: 4.2 MIL/uL — ABNORMAL LOW (ref 4.22–5.81)
RDW: 13.2 % (ref 11.5–15.5)
WBC: 10 10*3/uL (ref 4.0–10.5)
nRBC: 0 % (ref 0.0–0.2)

## 2020-08-28 LAB — BASIC METABOLIC PANEL
Anion gap: 6 (ref 5–15)
BUN: 19 mg/dL (ref 8–23)
CO2: 27 mmol/L (ref 22–32)
Calcium: 8.7 mg/dL — ABNORMAL LOW (ref 8.9–10.3)
Chloride: 101 mmol/L (ref 98–111)
Creatinine, Ser: 1.31 mg/dL — ABNORMAL HIGH (ref 0.61–1.24)
GFR, Estimated: 51 mL/min — ABNORMAL LOW (ref >60)
Glucose, Bld: 119 mg/dL — ABNORMAL HIGH (ref 70–99)
Potassium: 4.2 mmol/L (ref 3.5–5.1)
Sodium: 134 mmol/L — ABNORMAL LOW (ref 135–145)

## 2020-08-28 LAB — GLUCOSE, CAPILLARY
Glucose-Capillary: 102 mg/dL — ABNORMAL HIGH (ref 70–99)
Glucose-Capillary: 110 mg/dL — ABNORMAL HIGH (ref 70–99)
Glucose-Capillary: 128 mg/dL — ABNORMAL HIGH (ref 70–99)
Glucose-Capillary: 129 mg/dL — ABNORMAL HIGH (ref 70–99)
Glucose-Capillary: 144 mg/dL — ABNORMAL HIGH (ref 70–99)
Glucose-Capillary: 150 mg/dL — ABNORMAL HIGH (ref 70–99)

## 2020-08-28 MED ORDER — POTASSIUM CHLORIDE CRYS ER 20 MEQ PO TBCR
40.0000 meq | EXTENDED_RELEASE_TABLET | Freq: Every day | ORAL | Status: DC
  Administered 2020-08-28 – 2020-08-31 (×4): 40 meq via ORAL
  Filled 2020-08-28 (×4): qty 2

## 2020-08-28 MED ORDER — SODIUM CHLORIDE 0.9 % IV SOLN: 250.0000 mL | INTRAVENOUS | Status: DC | PRN

## 2020-08-28 MED ORDER — SODIUM CHLORIDE 0.9% FLUSH
3.0000 mL | Freq: Two times a day (BID) | INTRAVENOUS | Status: DC
  Administered 2020-08-28 – 2020-08-30 (×5): 3 mL via INTRAVENOUS

## 2020-08-28 MED ORDER — INSULIN ASPART 100 UNIT/ML ~~LOC~~ SOLN: 0.0000 [IU] | Freq: Three times a day (TID) | SUBCUTANEOUS | Status: DC

## 2020-08-28 MED ORDER — ENSURE ENLIVE PO LIQD
237.0000 mL | Freq: Two times a day (BID) | ORAL | Status: DC
  Administered 2020-08-28 – 2020-08-31 (×6): 237 mL via ORAL

## 2020-08-28 MED ORDER — BUPROPION HCL ER (XL) 300 MG PO TB24
300.0000 mg | ORAL_TABLET | Freq: Every day | ORAL | Status: DC
  Administered 2020-08-28 – 2020-08-31 (×4): 300 mg via ORAL
  Filled 2020-08-28: qty 1
  Filled 2020-08-28 (×2): qty 2
  Filled 2020-08-28: qty 1

## 2020-08-28 MED ORDER — FUROSEMIDE 40 MG PO TABS
40.0000 mg | ORAL_TABLET | Freq: Every day | ORAL | Status: DC
  Administered 2020-08-28 – 2020-08-31 (×4): 40 mg via ORAL
  Filled 2020-08-28 (×4): qty 1

## 2020-08-28 MED ORDER — INSULIN ASPART 100 UNIT/ML ~~LOC~~ SOLN: 0.0000 [IU] | SUBCUTANEOUS | Status: DC

## 2020-08-28 MED ORDER — SODIUM CHLORIDE 0.9% FLUSH: 3.0000 mL | INTRAVENOUS | Status: DC | PRN

## 2020-08-28 MED ORDER — ~~LOC~~ CARDIAC SURGERY, PATIENT & FAMILY EDUCATION: Freq: Once | Status: DC

## 2020-08-28 NOTE — Progress Notes (Signed)
RT spoke to patient about wearing CPAP tonight and the patient stated that hospital provided CPAP isn't comfortable and couldn't wear it.  Patient stated he will have spouse bring in his home machine and mask for the remainder of his stay.

## 2020-08-28 NOTE — Progress Notes (Signed)
Patient complaints of increasing feeling of anxiety/panic. Is profusely diaphoretic, irritated with moderate hand tremors.  Requesting something for anxiety. Takes Xanax at bedtime daily Other VSS otherwise. Dr. Kipp Brood made aware   Verbal order to restart home dose of 0.5 mg of Xanax   2200: Patient states he is feeling slightly better. Diaphoresis and tremors no longer present at this time.

## 2020-08-28 NOTE — Progress Notes (Addendum)
      [address]       [place] [address]             [phone]                 2 Days Post-Op Procedure(s) (LRB): CORONARY ARTERY BYPASS GRAFTING (CABG) TIMES FIVE, USING LEFT INTERNAL MAMMARY ARTERY AND RIGHT GREATER SAPHENOUS VEIN HARVESTED ENDOSCOPICALLY (N/A) AORTIC VALVE REPLACEMENT (AVR) USING INSPIRIS VALVE SIZE 25MM (N/A) TRANSESOPHAGEAL ECHOCARDIOGRAM (TEE) (N/A)   Events: Doing well _______________________________________________________________ Vitals: BP 105/76   Pulse 77   Temp 98.2 F (36.8 C) (Oral)   Resp 17   Ht 6\' 4"  (1.93 m)   Wt 109.4 kg   SpO2 91%   BMI 29.35 kg/m   - Neuro: alert NAD  - Cardiovascular: sinus  Drips: none.   PAP: (29-35)/(12-15) 35/15  - Pulm: EWOB  ABG    Component Value Date/Time   PHART 7.368 08/26/2020 2319   PCO2ART 41.8 08/26/2020 2319   PO2ART 76 (L) 08/26/2020 2319   HCO3 24.0 08/26/2020 2319   TCO2 25 08/26/2020 2319   ACIDBASEDEF 1.0 08/26/2020 2319   O2SAT 94.0 08/26/2020 2319    - Abd: soft - Extremity: warm  .Intake/Output      10/16 0701 - 10/17 0700 10/17 0701 - 10/18 0700   P.O. 1680    I.V. (mL/kg) 128.9 (1.2)    Blood     IV Piggyback     Total Intake(mL/kg) 1808.9 (16.5)    Urine (mL/kg/hr) 2874 (1.1) 185 (0.4)   Emesis/NG output     Blood     Chest Tube 640 100   Total Output 3514 285   Net -1705.1 -285           _______________________________________________________________ Labs: CBC Latest Ref Rng & Units 08/28/2020 08/27/2020 08/27/2020  WBC 4.0 - 10.5 K/uL 10.0 9.7 10.8(H)  Hemoglobin 13.0 - 17.0 g/dL 13.8 14.1 13.8  Hematocrit 39 - 52 % 42.3 43.4 42.1  Platelets 150 - 400 K/uL 117(L) 127(L) 118(L)   CMP Latest Ref Rng & Units 08/28/2020 08/27/2020 08/27/2020  Glucose 70 - 99 mg/dL 119(H) 169(H) -  BUN 8 - 23 mg/dL 19 21 -  Creatinine 0.61 - 1.24 mg/dL 1.31(H) 1.45(H) 1.40(H)  Sodium 135 - 145 mmol/L 134(L) 131(L) -  Potassium 3.5 - 5.1 mmol/L 4.2 4.4  -  Chloride 98 - 111 mmol/L 101 100 -  CO2 22 - 32 mmol/L 27 25 -  Calcium 8.9 - 10.3 mg/dL 8.7(L) 8.3(L) -  Total Protein 6.5 - 8.1 g/dL - - -  Total Bilirubin 0.3 - 1.2 mg/dL - - -  Alkaline Phos 38 - 126 U/L - - -  AST 15 - 41 U/L - - -  ALT 0 - 44 U/L - - -    CXR: stable  _______________________________________________________________  Assessment and Plan: POD 2 s/p CABG  Neuro: pain controlled CV: on A/S/BB.  Pulm: pulm toilet Renal: creat stable.  Diuresis today.  Will keep foley in due to bladder and prostate Hx.  Will remove tomorrow GI: advancing diet Heme: stable ID: [email or web address] Endo: SSI Dispo: floor today  Melodie Bouillon, MD 08/28/2020 11:30 AM

## 2020-08-28 NOTE — Progress Notes (Signed)
   The patient is hemodynamically stable but agitated this morning because Wellbutrin and Xanax have been held.  These have been ordered by Dr. Kipp Brood.  Otherwise doing okay.  No arrhythmia.

## 2020-08-28 NOTE — Progress Notes (Signed)
EVENING ROUNDS NOTE :     [address]       [place] [address]             [phone]                 2 Days Post-Op Procedure(s) (LRB): CORONARY ARTERY BYPASS GRAFTING (CABG) TIMES FIVE, USING LEFT INTERNAL MAMMARY ARTERY AND RIGHT GREATER SAPHENOUS VEIN HARVESTED ENDOSCOPICALLY (N/A) AORTIC VALVE REPLACEMENT (AVR) USING INSPIRIS VALVE SIZE 25MM (N/A) TRANSESOPHAGEAL ECHOCARDIOGRAM (TEE) (N/A)   Total Length of Stay:  LOS: 6 days  Events:   No events Awaiting floor bed    BP 120/80   Pulse 78   Temp 98.2 F (36.8 C) (Oral)   Resp 18   Ht 6\' 4"  (1.93 m)   Wt 109.4 kg   SpO2 96%   BMI 29.35 kg/m         . sodium chloride    . sodium chloride    . dexmedetomidine (PRECEDEX) IV infusion Stopped (08/27/20 0457)  . lactated ringers    . lactated ringers    . lactated ringers Stopped (08/27/20 0536)    I/O last 3 completed shifts: In: 1808.9 [P.O.:1680; I.V.:128.9] Out: 0093 [Urine:3339; Chest Tube:760]   CBC Latest Ref Rng & Units 08/28/2020 08/27/2020 08/27/2020  WBC 4.0 - 10.5 K/uL 10.0 9.7 10.8(H)  Hemoglobin 13.0 - 17.0 g/dL 13.8 14.1 13.8  Hematocrit 39 - 52 % 42.3 43.4 42.1  Platelets 150 - 400 K/uL 117(L) 127(L) 118(L)    BMP Latest Ref Rng & Units 08/28/2020 08/27/2020 08/27/2020  Glucose 70 - 99 mg/dL 119(H) 169(H) -  BUN 8 - 23 mg/dL 19 21 -  Creatinine 0.61 - 1.24 mg/dL 1.31(H) 1.45(H) 1.40(H)  BUN/Creat Ratio 10 - 24 - - -  Sodium 135 - 145 mmol/L 134(L) 131(L) -  Potassium 3.5 - 5.1 mmol/L 4.2 4.4 -  Chloride 98 - 111 mmol/L 101 100 -  CO2 22 - 32 mmol/L 27 25 -  Calcium 8.9 - 10.3 mg/dL 8.7(L) 8.3(L) -    ABG    Component Value Date/Time   PHART 7.368 08/26/2020 2319   PCO2ART 41.8 08/26/2020 2319   PO2ART 76 (L) 08/26/2020 2319   HCO3 24.0 08/26/2020 2319   TCO2 25 08/26/2020 2319   ACIDBASEDEF 1.0 08/26/2020 2319   O2SAT 94.0 08/26/2020 2319       Melodie Bouillon, MD 08/28/2020 7:57 PM

## 2020-08-29 ENCOUNTER — Encounter (HOSPITAL_COMMUNITY): Payer: Self-pay | Admitting: Surgery

## 2020-08-29 ENCOUNTER — Ambulatory Visit: Payer: PPO | Admitting: Cardiology

## 2020-08-29 LAB — BASIC METABOLIC PANEL
Anion gap: 9 (ref 5–15)
BUN: 23 mg/dL (ref 8–23)
CO2: 24 mmol/L (ref 22–32)
Calcium: 8.6 mg/dL — ABNORMAL LOW (ref 8.9–10.3)
Chloride: 99 mmol/L (ref 98–111)
Creatinine, Ser: 1.24 mg/dL (ref 0.61–1.24)
GFR, Estimated: 55 mL/min — ABNORMAL LOW (ref >60)
Glucose, Bld: 154 mg/dL — ABNORMAL HIGH (ref 70–99)
Potassium: 4.3 mmol/L (ref 3.5–5.1)
Sodium: 132 mmol/L — ABNORMAL LOW (ref 135–145)

## 2020-08-29 LAB — SURGICAL PATHOLOGY

## 2020-08-29 LAB — CBC
HCT: 39.6 % (ref 39.0–52.0)
Hemoglobin: 13.3 g/dL (ref 13.0–17.0)
MCH: 32.6 pg (ref 26.0–34.0)
MCHC: 33.6 g/dL (ref 30.0–36.0)
MCV: 97.1 fL (ref 80.0–100.0)
Platelets: 125 10*3/uL — ABNORMAL LOW (ref 150–400)
RBC: 4.08 MIL/uL — ABNORMAL LOW (ref 4.22–5.81)
RDW: 13.2 % (ref 11.5–15.5)
WBC: 9.2 10*3/uL (ref 4.0–10.5)
nRBC: 0 % (ref 0.0–0.2)

## 2020-08-29 LAB — GLUCOSE, CAPILLARY
Glucose-Capillary: 91 mg/dL (ref 70–99)
Glucose-Capillary: 84 mg/dL (ref 70–99)
Glucose-Capillary: 124 mg/dL — ABNORMAL HIGH (ref 70–99)

## 2020-08-29 MED ORDER — TRAMADOL HCL 50 MG PO TABS
50.0000 mg | ORAL_TABLET | ORAL | Status: DC | PRN
  Filled 2020-08-29: qty 1

## 2020-08-29 MED FILL — Thrombin (Recombinant) For Soln 20000 Unit: CUTANEOUS | Qty: 1 | Status: AC

## 2020-08-29 MED FILL — Heparin Sodium (Porcine) Inj 1000 Unit/ML: INTRAMUSCULAR | Qty: 30 | Status: AC

## 2020-08-29 NOTE — Progress Notes (Signed)
CARDIAC REHAB PHASE I   Returned to offer to walk with pt. Pt states he just got into bed and would like some time to rest. Pt states potential of d/c tomorrow. Wife states some concerns as it is just the two of them and she is not in the best of health. Pt requesting to speak with CM about options. Encouraged use of front wheel walker for ambulation this evening. Will f/u tomorrow.  [phone] Rufina Falco, RN BSN 08/29/2020 3:02 PM

## 2020-08-29 NOTE — Hospital Course (Addendum)
History of Present Illness:    Mr. Anthony Hartman lives in [place], [place] with his wife. He is very active working outside. He has had regular dental work with no ongoing issues.  He plays golf a few times per week.   He has been followed by Dr. Percival Spanish for CAD and aortic stenosis. His cardiac history dates back to 2012 when he was found to have an occluded OM, which was managed medically. He then presented with an inferior MI in 2017 and underwent PCI/BMS to RCA. Cath in 2018 showed an occluded ramus that could not be crossed. He then represented in 04/2019 with [place]. Cath at that time showed 95% 2nd diagonal stenosis managed with balloon angioplasty, with medically managed patent RCA stent with 25% ISR, 99% OM1 stenosis (essentially a CTO), 25% mLCx stenosis, 25% pLAD stenosis, 50% mLAD stenosis, with EF 55-65% and moderate AS.   He underwent ETT in 05/2020 which did not show any high risk findings, although it was a submaximal study. Echo in 05/2020 showed EF 55-60%, mod LVH, and severe AS with a mean gradient of 46, DVI 0.2, AVA 0.7. He was seen in the office by Dr. Percival Spanish and felt to be minimally symptomatic and close follow up was recommended.    He was in his usual state of health until the early morning of 10/11 when he was awoken from sleep with chest pain. EMS was activated and he was brought to [facility]. Labs showed Cr 1.5>1.4, Hgb 18, PLT 187, LDL 68, HsTrop 247--> 736--> 1625. EKG with sinus rhythm, rate 64 bpm, chronic RBBB, new inferolateral TWI, submm STE in aVR, V1-3. CXR without acute findings. Respiratory panel negative for influenza/COVID-19. He was given SL nitro x2 and aspirin en route to the ED and  IV fentanyl with resolution of chest pain upon arrival. He was started on a heparin gtt and admitted by cardiology.    Repeat echo 10/11 showed EF 50-55%, mod concentric LVH, mild hypokinesis of LV, basal anterior wasll, anterolateral wall and inferolateral wall, severe LAE, mild-mod RAE, moderate MAC,  severe AS with mean gradient of 42 mm hg, peak gradient 69.6 mm hg, AVA 0.65 cm2, DVI 0.17, moderate AI and moderate PR. L/RHC 10/12 showed severe mid LAD stenosis at the takeoff of the large Diagonal branch (LAD lesion is flow limiting by pressure wire (DFR 0.84)). The Diagonal ostial stenosis has recurred following balloon angioplasty in June 2020. He has had a functional occlusion of an obtuse marginal branch for years with prior failed PCI of this branch. It was felt he would need surgical AVR with bypass vs TAVR and high risk PCI. The intervention in the LAD and Diagonal would be difficult and there is a chance we would not have an optimal result with bifurcation stenting or primary stenting of the LAD with angioplasty alone of the Diagonal.   Given multivessel CAD and severe AS, cardiothoracic surgery was consulted for coronary revascularization and AVR options.    He reports that he has had fatigue over the past year but has been to high activity level working in his yard and playing golf.  He has had occasional episodes of chest discomfort particularly when he is doing heavy work in his yard.  These have been very mild compared to the episode that brought him to the hospital.  He denies any shortness of breath.  He has had no dizziness or syncope.    Hospital course: The patient was medically stabilized and after full cardiology evaluation  including echocardiogram and catheterization as described above it was determined that he should undergo CABG/AVR.  On 08/26/2020 he was taken to the operating room where he underwent: Median Sternotomy Extracorporeal circulation 3.   Coronary artery bypass grafting x 5   Left internal mammary artery graft to the LAD SVG to diagonal Sequential SVG to OM1 and OM2 SVG to PDA. 4.   Endoscopic vein harvest from the right leg 5.   Aortic valve replacement using a 25 mm Edwards INSPIRIS RESILIA Pericardial valve.  He tolerated the procedure well was taken to  the surgical intensive care unit in stable condition.  Postoperative hospital course  The patient has done well.  He was extubated using standard post cardiac surgical protocols without difficulty.  He is remained neurologically intact.  He has maintained normal sinus rhythm.  He does have some postoperative volume overload but is responding well to diuretics.  Blood sugars have been under good control using standard protocols.  All routine lines, monitors and drainage devices have been discontinued in the standard fashion.  On postop day 3 he was transferred to 4 E.  He is started on a course of routine post cardiac surgical rehabilitation protocols.  He remains in NSR and his pacing wires were removed without difficulty.  He developed complaints of bladder spasms and dysuria post foley removal.  He was voiding but felt like he had to force the urine to come out.  He was started on Cardura for this.  He will require follow up with Urologist if this doesn't improve.  He is ambulating independently.  His surgical incisions are healing without evidence of infection.  He is medically stable for discharge home today.

## 2020-08-29 NOTE — Progress Notes (Signed)
   Feels much better today.  After getting bupropion and Xanax, he was able to settle down.  Will be transferred to the floor today.  Vital signs are stable.

## 2020-08-29 NOTE — Discharge Instructions (Signed)
[facility] office number [id number]    Endoscopic Saphenous Vein Harvesting, Care After This sheet gives you information about how to care for yourself after your procedure. Your health care provider may also give you more specific instructions. If you have problems or questions, contact your health care provider. What can I expect after the procedure? After the procedure, it is common to have:  Pain.  Bruising.  Swelling.  Numbness. Follow these instructions at home: Incision care   Follow instructions from your health care provider about how to take care of your incisions. Make sure you: ? Wash your hands with soap and water before and after you change your bandages (dressings). If soap and water are not available, use hand sanitizer. ? Change your dressings as told by your health care provider. ? Leave stitches (sutures), skin glue, or adhesive strips in place. These skin closures may need to stay in place for 2 weeks or longer. If adhesive strip edges start to loosen and curl up, you may trim the loose edges. Do not remove adhesive strips completely unless your health care provider tells you to do that.  Check your incision areas every day for signs of infection. Check for: ? More redness, swelling, or pain. ? Fluid or blood. ? Warmth. ? Pus or a bad smell. Medicines  Take over-the-counter and prescription medicines only as told by your health care provider.  Ask your health care provider if the medicine prescribed to you requires you to avoid driving or using heavy machinery. General instructions  Raise (elevate) your legs above the level of your heart while you are sitting or lying down.  Avoid crossing your legs.  Avoid sitting for long periods of time. Change positions every 30 minutes.  Do any exercises your health care providers have given you. These may include deep breathing, coughing, and walking exercises.  Do not take baths, swim, or use a hot tub until your  health care provider approves. Ask your health care provider if you may take showers. You may only be allowed to take sponge baths.  Wear compression stockings as told by your health care provider. These stockings help to prevent blood clots and reduce swelling in your legs.  Keep all follow-up visits as told by your health care provider. This is important. Contact a health care provider if:  Medicine does not help your pain.  Your pain gets worse.  You have new leg bruises or your leg bruises get bigger.  Your leg feels numb.  You have more redness, swelling, or pain around your incision.  You have fluid or blood coming from your incision.  Your incision feels warm to the touch.  You have pus or a bad smell coming from your incision.  You have a fever. Get help right away if:  Your pain is severe.  You develop pain, tenderness, warmth, redness, or swelling in any part of your leg.  You have chest pain.  You have trouble breathing. Summary  Raise (elevate) your legs above the level of your heart while you are sitting or lying down.  Wear compression stockings as told by your health care provider.  Make sure you know which symptoms should prompt you to contact your health care provider.  Keep all follow-up visits as told by your health care provider. This information is not intended to replace advice given to you by your health care provider. Make sure you discuss any questions you have with your health care provider. Document Revised:  10/06/2018 Document Reviewed: 10/06/2018 Elsevier Patient Education  [place]. Surgical Aortic Valve Replacement, Care After This sheet gives you information about how to care for yourself after your procedure. Your health care provider may also give you more specific instructions. If you have problems or questions, contact your health care provider. What can I expect after the procedure? After the procedure, it is common to  have pain around your incision area. Follow these instructions at home: Medicines  Take over-the-counter and prescription medicines only as told by your health care provider.  If you were prescribed an antibiotic medicine, take it as told by your health care provider. Do not stop taking the antibiotic even if you start to feel better.  If you have a mechanical prosthesis, you may be given a blood thinner called warfarin. Follow instructions carefully on how to take this medicine.  Ask your health care provider if the medicine prescribed to you: ? Requires you to avoid driving or using heavy machinery. ? Can cause constipation. You may need to take actions to prevent or treat constipation, such as:  Take over-the-counter or prescription medicines.  Eat foods that are high in fiber, such as beans, whole grains, and fresh fruits and vegetables.  Limit foods that are high in fat and processed sugars, such as fried or sweet foods. Eating and drinking      Limit how much caffeine you drink. Caffeine can affect your heart's rate and rhythm.  Do not drink alcohol if: ? Your health care provider tells you not to drink. ? You are pregnant, may be pregnant, or are planning to become pregnant.  Drink enough fluid to keep your urine pale yellow.  Eat a heart-healthy diet that includes fruits, vegetables, whole grains, low-fat dairy products, and lean proteins like poultry and eggs. Incision care   Follow instructions from your health care provider about how to take care of your incision. Make sure you: ? Wash your hands with soap and water before and after you change your bandage (dressing). If soap and water are not available, use hand sanitizer. ? Change your dressing as told by your health care provider. ? Leave stitches (sutures), skin glue, or adhesive strips in place. These skin closures may need to stay in place for 2 weeks or longer. If adhesive strip edges start to loosen and curl  up, you may trim the loose edges. Do not remove adhesive strips completely unless your health care provider tells you to do that.  Check your incision area every day for signs of infection. Check for: ? Redness, swelling, or increasing pain. ? Fluid or blood. ? Warmth. ? Pus or a bad smell. Activity  Return to your normal activities as told by your health care provider. Most patients will need to limit any lifting or strenuous activity for 4-6 weeks.  Avoid sitting for a long time without moving. Get up to take short walks every 1-2 hours.  Do exercises as told by your health care provider.  Do not lift anything that is heavier than 10 lb (4.5 kg), or the limit that you are told, until your health care provider says that it is safe.  Avoid pushing or pulling things with your arms until your health care provider approves. This includes pulling on handrails to help you climb stairs. Lifestyle  Do not use any products that contain nicotine or tobacco, such as cigarettes, e-cigarettes, and chewing tobacco. These can delay incision healing after surgery. If you need help  quitting, ask your health care provider.  Resume sexual activity as told by your health care provider. If you have erectile dysfunction, do not use medicines to treat this condition unless your health care provider approves.  Work with your health care provider to: ? Keep your blood pressure and cholesterol under control. ? Manage any other heart conditions that you have. ? Maintain a healthy weight. Driving and travel  Do not drive until your health care provider approves. Ask your health care provider when it is safe for you to drive.  Avoid airplane travel for as long as told by your health care provider.  When you travel, bring a list of your medicines and a record of your medical history with you. Carry your medicines with you. General instructions  Do not take baths, swim, or use a hot tub until your health care  provider approves. You may take a shower  Do not strain to have a bowel movement.  Avoid crossing your legs while sitting down.  Check your temperature every day for a fever. A fever may be a sign of infection.  If you are a woman and you plan to become pregnant, talk with your health care provider before you become pregnant.  Wear compression stockings as told by your health care provider. These stockings help to prevent blood clots and reduce swelling in your legs.  Tell all health care providers who care for you that you have an artificial (prosthetic) aortic valve. Also, tell them if you have or have had heart disease or endocarditis.  You will be given a card at discharge. The card indicates the type of prosthetic valve that you have. Keep this card in your wallet or purse for quick reference in case of an emergency.  Keep all follow-up visits as told by your health care provider. This is important. Contact a health care provider if:  You develop a skin rash.  You experience sudden, unexplained changes in your weight.  You have redness, swelling, or increasing pain around your incision.  You have fluid or blood coming from your incision.  Your incision feels warm to the touch.  You have pus or a bad smell coming from your incision.  You have a fever. Get help right away if you:  Develop chest pain that is different from the pain coming from your incision.  Develop shortness of breath or difficulty breathing.  Start to feel light-headed. These symptoms may represent a serious problem that is an emergency. Do not wait to see if the symptoms will go away. Get medical help right away. Call your local emergency services (911 in the [place] Do not drive yourself to the hospital. Summary  After this procedure, it is common to have pain in the incision area.  Eat a heart-healthy diet. Follow instructions about alcohol use.  Get up to walk often. Avoid pushing and pulling  with your arms. Ask what activities are safe for you.  Care for your incision as told by your health care provider. Check it daily for signs of infection.  Get help right away if you have chest pain that is different from your incisions, develop shortness of breath or difficulty breathing, or start to feel light-headed. This information is not intended to replace advice given to you by your health care provider. Make sure you discuss any questions you have with your health care provider. Document Revised: 07/24/2018 Document Reviewed: 07/24/2018 Elsevier Patient Education  [place]. Coronary Artery Bypass Grafting,  Care After This sheet gives you information about how to care for yourself after your procedure. Your doctor may also give you more specific instructions. If you have problems or questions, call your doctor. What can I expect after the procedure? After the procedure, it is common to:  Feel sick to your stomach (nauseous).  Not want to eat as much as normal (lack of appetite).  Have trouble pooping (constipation).  Have weakness and tiredness (fatigue).  Feel sad (depressed) or grouchy (irritable).  Have pain or discomfort around the cuts from surgery (incisions). Follow these instructions at home: Medicines  Take over-the-counter and prescription medicines only as told by your doctor. Do not stop taking medicines or start any new medicines unless your doctor says it is okay.  If you were prescribed an antibiotic medicine, take it as told by your doctor. Do not stop taking the antibiotic even if you start to feel better. Incision care   Follow instructions from your doctor about how to take care of your cuts from surgery. Make sure you: ? Wash your hands with soap and water before and after you change your bandage (dressing). If you cannot use soap and water, use hand sanitizer. ? Change your bandage as told by your doctor. ? Leave stitches (sutures), skin  glue, or skin tape (adhesive) strips in place. They may need to stay in place for 2 weeks or longer. If tape strips get loose and curl up, you may trim the loose edges. Do not remove tape strips completely unless your doctor says it is okay.  Make sure the surgery cuts are clean, dry, and protected.  Check your cut areas every day for signs of infection. Check for: ? More redness, swelling, or pain. ? More fluid or blood. ? Warmth. ? Pus or a bad smell.  If cuts were made in your legs: ? Avoid crossing your legs. ? Avoid sitting for long periods of time. Change positions every 30 minutes. ? Raise (elevate) your legs when you are sitting. Bathing  Do not take baths, swim, or use a hot tub until your doctor says it is okay. You may shower. Pat the surgery cuts dry. Do not rub the cuts to dry. Eating and drinking   Eat foods that are high in fiber, such as beans, nuts, whole grains, and raw fruits and vegetables. Any meats you eat should be lean cut. Avoid canned, processed, and fried foods. This can help prevent trouble pooping. This is also a part of a heart-healthy diet.  Drink enough fluid to keep your pee (urine) pale yellow.  Do not drink alcohol until you are fully recovered. Ask your doctor when it is safe to drink alcohol. Activity  Rest and limit your activity as told by your doctor. You may be told to: ? Stop any activity right away if you have chest pain, shortness of breath, irregular heartbeats, or dizziness. Get help right away if you have any of these symptoms. ? Move around often for short periods or take short walks as told by your doctor. Slowly increase your activities. ? Avoid lifting, pushing, or pulling anything that is heavier than 10 lb (4.5 kg) for at least 6 weeks or as told by your doctor.  Do physical therapy or a cardiac rehab (cardiac rehabilitation) program as told by your doctor. ? Physical therapy involves doing exercises to maintain movement and build  strength and endurance. ? A cardiac rehab program includes:  Exercise training.  Education.  Counseling.  Do not drive until your doctor says it is okay.  Ask your doctor when you can go back to work.  Ask your doctor when you can be sexually active. General instructions  Do not drive or use heavy machinery while taking prescription pain medicine.  Do not use any products that contain nicotine or tobacco. These include cigarettes, e-cigarettes, and chewing tobacco. If you need help quitting, ask your doctor.  Take 2-3 deep breaths every few hours during the day while you get better. This helps expand your lungs and prevent problems.  If you were given a device called an incentive spirometer, use it several times a day to practice deep breathing. Support your chest with a pillow or your arms when you take deep breaths or cough.  Wear compression stockings as told by your doctor.  Weigh yourself every day. This helps to see if your body is holding (retaining) fluid that may make your heart and lungs work harder.  Keep all follow-up visits as told by your doctor. This is important. Contact a doctor if:  You have more redness, swelling, or pain around any cut.  You have more fluid or blood coming from any cut.  Any cut feels warm to the touch.  You have pus or a bad smell coming from any cut.  You have a fever.  You have swelling in your ankles or legs.  You have pain in your legs.  You gain 2 lb (0.9 kg) or more a day.  You feel sick to your stomach or you throw up (vomit).  You have watery poop (diarrhea). Get help right away if:  You have chest pain that goes to your jaw or arms.  You are short of breath.  You have a fast or irregular heartbeat.  You notice a "clicking" in your breastbone (sternum) when you move.  You have any signs of a stroke. "BE FAST" is an easy way to remember the main warning signs: ? B - Balance. Signs are dizziness, sudden trouble  walking, or loss of balance. ? E - Eyes. Signs are trouble seeing or a change in how you see. ? F - Face. Signs are sudden weakness or loss of feeling of the face, or the face or eyelid drooping on one side. ? A - Arms. Signs are weakness or loss of feeling in an arm. This happens suddenly and usually on one side of the body. ? S - Speech. Signs are sudden trouble speaking, slurred speech, or trouble understanding what people say. ? T - Time. Time to call emergency services. Write down what time symptoms started.  You have other signs of a stroke, such as: ? A sudden, very bad headache with no known cause. ? Feeling sick to your stomach. ? Throwing up. ? Jerky movements you cannot control (seizure). These symptoms may be an emergency. Do not wait to see if the symptoms will go away. Get medical help right away. Call your local emergency services (911 in the [place] Do not drive yourself to the hospital. Summary  After the procedure, it is common to have pain or discomfort in the cuts from surgery (incisions).  Do not take baths, swim, or use a hot tub until your doctor says it is okay.  Slowly increase your activities. You may need physical therapy or cardiac rehab.  Weigh yourself every day. This helps to see if your body is holding fluid. This information is not intended to replace advice given to you by  your health care provider. Make sure you discuss any questions you have with your health care provider. Document Revised: 07/08/2018 Document Reviewed: 07/08/2018 Elsevier Patient Education  2020 [employer].

## 2020-08-29 NOTE — Progress Notes (Signed)
3 Days Post-Op Procedure(s) (LRB): CORONARY ARTERY BYPASS GRAFTING (CABG) TIMES FIVE, USING LEFT INTERNAL MAMMARY ARTERY AND RIGHT GREATER SAPHENOUS VEIN HARVESTED ENDOSCOPICALLY (N/A) AORTIC VALVE REPLACEMENT (AVR) USING INSPIRIS VALVE SIZE 25MM (N/A) TRANSESOPHAGEAL ECHOCARDIOGRAM (TEE) (N/A) Subjective: Feels well overall. Some left sided chest wall pain. Probably from IMA harvest.  Objective: Vital signs in last 24 hours: Temp:  [98.1 F (36.7 C)-98.8 F (37.1 C)] 98.2 F (36.8 C) (10/18 0400) Pulse Rate:  [66-100] 73 (10/18 0400) Cardiac Rhythm: Normal sinus rhythm (10/17 2000) Resp:  [9-18] 9 (10/18 0400) BP: (98-138)/(56-80) 127/62 (10/18 0400) SpO2:  [91 %-96 %] 96 % (10/18 0400) Weight:  [108.1 kg] 108.1 kg (10/18 0700)  Hemodynamic parameters for last 24 hours:    Intake/Output from previous day: 10/17 0701 - 10/18 0700 In: -  Out: 1685 [Urine:1565; Chest Tube:120] Intake/Output this shift: No intake/output data recorded.  General appearance: alert and cooperative Neurologic: intact Heart: regular rate and rhythm, S1, S2 normal, no murmur Lungs: clear to auscultation bilaterally Extremities: no edema Wound: dressing dry  Lab Results: Recent Labs    08/28/20 0342 08/29/20 0115  WBC 10.0 9.2  HGB 13.8 13.3  HCT 42.3 39.6  PLT 117* 125*   BMET:  Recent Labs    08/28/20 0342 08/29/20 0115  NA 134* 132*  K 4.2 4.3  CL 101 99  CO2 27 24  GLUCOSE 119* 154*  BUN 19 23  CREATININE 1.31* 1.24  CALCIUM 8.7* 8.6*    PT/INR:  Recent Labs    08/26/20 1602  LABPROT 18.2*  INR 1.6*   ABG    Component Value Date/Time   PHART 7.368 08/26/2020 2319   HCO3 24.0 08/26/2020 2319   TCO2 25 08/26/2020 2319   ACIDBASEDEF 1.0 08/26/2020 2319   O2SAT 94.0 08/26/2020 2319   CBG (last 3)  Recent Labs    08/28/20 1612 08/28/20 2225 08/29/20 0607  GLUCAP 128* 144* 91    Assessment/Plan: S/P Procedure(s) (LRB): CORONARY ARTERY BYPASS GRAFTING (CABG)  TIMES FIVE, USING LEFT INTERNAL MAMMARY ARTERY AND RIGHT GREATER SAPHENOUS VEIN HARVESTED ENDOSCOPICALLY (N/A) AORTIC VALVE REPLACEMENT (AVR) USING INSPIRIS VALVE SIZE 25MM (N/A) TRANSESOPHAGEAL ECHOCARDIOGRAM (TEE) (N/A)  POD 3 Hemodynamically stable in sinus rhythm. Continue Lopressor.  Volume excess: wt still up 11 lbs over preop. Continue diuresis.  Glucose under control. No hx of DM. DC CBG's  Continue IS, ambulation.  DC foley.  Waiting on 4E bed.   LOS: 7 days    Gaye Pollack 08/29/2020

## 2020-08-29 NOTE — Progress Notes (Signed)
Patient ID: Anthony Hartman, male   DOB: [birth year], 79 y.o.   MRN: [id number] EVENING ROUNDS NOTE :     [address]       [place] [address]             [phone]                 3 Days Post-Op Procedure(s) (LRB): CORONARY ARTERY BYPASS GRAFTING (CABG) TIMES FIVE, USING LEFT INTERNAL MAMMARY ARTERY AND RIGHT GREATER SAPHENOUS VEIN HARVESTED ENDOSCOPICALLY (N/A) AORTIC VALVE REPLACEMENT (AVR) USING INSPIRIS VALVE SIZE 25MM (N/A) TRANSESOPHAGEAL ECHOCARDIOGRAM (TEE) (N/A)  Total Length of Stay:  LOS: 7 days  BP 113/61   Pulse 77   Temp 98 F (36.7 C) (Oral)   Resp 10   Ht 6\' 4"  (1.93 m)   Wt 108.1 kg   SpO2 98%   BMI 29.01 kg/m   .Intake/Output      10/17 0701 - 10/18 0700 10/18 0701 - 10/19 0700   P.O.     I.V. (mL/kg)     Total Intake(mL/kg)     Urine (mL/kg/hr) 1565 (0.6) 1350 (1.1)   Chest Tube 120    Total Output 1685 1350   Net -1685 -1350          . sodium chloride    . sodium chloride    . lactated ringers       Lab Results  Component Value Date   WBC 9.2 08/29/2020   HGB 13.3 08/29/2020   HCT 39.6 08/29/2020   PLT 125 (L) 08/29/2020   GLUCOSE 154 (H) 08/29/2020   CHOL 136 08/22/2020   TRIG 73 08/22/2020   HDL 53 08/22/2020   LDLCALC 68 08/22/2020   ALT 24 08/22/2020   AST 30 08/22/2020   NA 132 (L) 08/29/2020   K 4.3 08/29/2020   CL 99 08/29/2020   CREATININE 1.24 08/29/2020   BUN 23 08/29/2020   CO2 24 08/29/2020   TSH 1.960 05/18/2020   INR 1.6 (H) 08/26/2020   HGBA1C 5.6 08/22/2020   Stable postop , up in chair  Waiting for bed on step down   Grace Isaac MD  Beeper [phone] Office [phone] 08/29/2020 6:22 PM

## 2020-08-30 MED ORDER — OXYBUTYNIN CHLORIDE 5 MG PO TABS
2.5000 mg | ORAL_TABLET | Freq: Three times a day (TID) | ORAL | Status: DC | PRN
  Filled 2020-08-30: qty 0.5

## 2020-08-30 MED ORDER — DOXAZOSIN MESYLATE 2 MG PO TABS
2.0000 mg | ORAL_TABLET | Freq: Every day | ORAL | Status: DC
  Administered 2020-08-30 – 2020-08-31 (×2): 2 mg via ORAL
  Filled 2020-08-30 (×2): qty 1

## 2020-08-30 MED ORDER — TAMSULOSIN HCL 0.4 MG PO CAPS
0.4000 mg | ORAL_CAPSULE | Freq: Every day | ORAL | Status: DC
  Administered 2020-08-30: 0.4 mg via ORAL
  Filled 2020-08-30: qty 1

## 2020-08-30 MED FILL — Mannitol IV Soln 20%: INTRAVENOUS | Qty: 500 | Status: AC

## 2020-08-30 MED FILL — Sodium Chloride IV Soln 0.9%: INTRAVENOUS | Qty: 3000 | Status: AC

## 2020-08-30 MED FILL — Lidocaine HCl Local Soln Prefilled Syringe 100 MG/5ML (2%): INTRAMUSCULAR | Qty: 5 | Status: AC

## 2020-08-30 MED FILL — Sodium Bicarbonate IV Soln 8.4%: INTRAVENOUS | Qty: 50 | Status: AC

## 2020-08-30 MED FILL — Albumin, Human Inj 5%: INTRAVENOUS | Qty: 250 | Status: AC

## 2020-08-30 MED FILL — Heparin Sodium (Porcine) Inj 1000 Unit/ML: INTRAMUSCULAR | Qty: 20 | Status: AC

## 2020-08-30 MED FILL — Electrolyte-R (PH 7.4) Solution: INTRAVENOUS | Qty: 6000 | Status: AC

## 2020-08-30 NOTE — Progress Notes (Addendum)
[address]       [place] [address]             [phone]      4 Days Post-Op Procedure(s) (LRB): CORONARY ARTERY BYPASS GRAFTING (CABG) TIMES FIVE, USING LEFT INTERNAL MAMMARY ARTERY AND RIGHT GREATER SAPHENOUS VEIN HARVESTED ENDOSCOPICALLY (N/A) AORTIC VALVE REPLACEMENT (AVR) USING INSPIRIS VALVE SIZE 25MM (N/A) TRANSESOPHAGEAL ECHOCARDIOGRAM (TEE) (N/A)   Subjective:  Patient states doing very well except for one thing.  He states that he is voiding, but it feels like his urine doesn't want to come out and he has to forcefully push.  There is also some mild burning present.  + BM + ambulation  Objective: Vital signs in last 24 hours: Temp:  [97.8 F (36.6 C)-98.2 F (36.8 C)] 98.2 F (36.8 C) (10/19 0531) Pulse Rate:  [56-85] 82 (10/19 0555) Cardiac Rhythm: Sinus bradycardia;Bundle branch block;Other (Comment) (10/19 0533) Resp:  [8-20] 14 (10/19 0555) BP: (113-164)/(61-97) 143/73 (10/19 0555) SpO2:  [84 %-99 %] 96 % (10/19 0555) Weight:  [105.7 kg] 105.7 kg (10/19 0510)  Intake/Output from previous day: 10/18 0701 - 10/19 0700 In: 480 [P.O.:480] Out: 3100 [Urine:3100]  General appearance: alert, cooperative and no distress Heart: regular rate and rhythm Lungs: clear to auscultation bilaterally Abdomen: soft, non-tender; bowel sounds normal; no masses,  no organomegaly Extremities: edema trace Wound: clean and dry  Lab Results: Recent Labs    08/28/20 0342 08/29/20 0115  WBC 10.0 9.2  HGB 13.8 13.3  HCT 42.3 39.6  PLT 117* 125*   BMET:  Recent Labs    08/28/20 0342 08/29/20 0115  NA 134* 132*  K 4.2 4.3  CL 101 99  CO2 27 24  GLUCOSE 119* 154*  BUN 19 23  CREATININE 1.31* 1.24  CALCIUM 8.7* 8.6*    PT/INR: No results for input(s): LABPROT, INR in the last 72 hours. ABG    Component Value Date/Time   PHART 7.368 08/26/2020 2319   HCO3 24.0 08/26/2020 2319   TCO2 25 08/26/2020 2319   ACIDBASEDEF 1.0 08/26/2020 2319    O2SAT 94.0 08/26/2020 2319   CBG (last 3)  Recent Labs    08/29/20 0607 08/29/20 1223 08/29/20 1548  GLUCAP 91 84 124*    Assessment/Plan: S/P Procedure(s) (LRB): CORONARY ARTERY BYPASS GRAFTING (CABG) TIMES FIVE, USING LEFT INTERNAL MAMMARY ARTERY AND RIGHT GREATER SAPHENOUS VEIN HARVESTED ENDOSCOPICALLY (N/A) AORTIC VALVE REPLACEMENT (AVR) USING INSPIRIS VALVE SIZE 25MM (N/A) TRANSESOPHAGEAL ECHOCARDIOGRAM (TEE) (N/A)  1. CV- NSR, BP in the 140s- on Lopressor, will leave BP alone for now with mild creatinine elevation, d/c EPW today 2. Pulm- no acute issues, off oxygen continue IS 3. Renal- creatinine improving, K is WNL, weight is improving, continue Lasix, potassium 4. GU-dysuria, suspect he is experiencing suspected irritation post foley catheter removal, however he does have a history of bladder cancer, will add flomax to see if provides any relief 5. Dispo- patient stable, mild HTN.Marland Kitchen okay with mild creatinine elevation, d/c EPW, continue diuretics, add flomax for urinary irritation, patient possibly ready for d/c later this afternoon vs. tomorrow   LOS: 8 days    Ellwood Handler, PA-C 08/30/2020   Chart reviewed, patient examined, agree with above. His main problem today has been bladder spasms and some urinary hesitancy. He has BPH and hx of prostate cancer s/p XRT as well as bladder cancer in past. Has had some problems with bladder spasms in past. Started on Cardura this  am and will add Ditropan prn.

## 2020-08-30 NOTE — Progress Notes (Signed)
CARDIAC REHAB PHASE I   PRE:  Rate/Rhythm: 78 SR  BP:  Sitting: 126/79      SaO2: 95 RA  MODE:  Ambulation: 300 ft   POST:  Rate/Rhythm: 99 SR  BP:  Sitting: 147/71    SaO2: 96 RA   Pt helped to BR. C/o bladder spasms and difficulty urinating. Bladder scan preformed 345 noted, RN aware. Pt then ambulated 318ft in hallway assist of one with front wheel walker. Pt slightly impulsive needing consistent reminders on sternal precautions. Pt demonstrating 1500 on IS. Encouraged continued ambulation and IS use. Will continue to follow.  [id number] Rufina Falco, RN BSN 08/30/2020 11:03 AM

## 2020-08-30 NOTE — Progress Notes (Addendum)
Patient arrived on 4E from 2 H on a wheelchair, assessment completed see flow sheet, placed on tele ccmd notified patient oriented to room and staff, bed in lowest position call bell within reach will continue to monitor, CHG bath given this morning on 2H just before patient moved to 4E per 2H RN.

## 2020-08-30 NOTE — Progress Notes (Signed)
Patient refused CPAP for tonight. RT instructed patient to have RT called if he changes his mind. RT will monitor as needed. 

## 2020-08-30 NOTE — Progress Notes (Signed)
   Stable with no arrhythmia.

## 2020-08-30 NOTE — Progress Notes (Signed)
Patient reports bladder spasms and difficulty urinating. Bladder scan 345. PA made aware.  Era Bumpers, RN

## 2020-08-30 NOTE — Progress Notes (Signed)
Mobility Specialist - Progress Note   08/30/20 1404  Mobility  Activity Ambulated in hall  Level of Assistance Minimal assist, patient does 75% or more  Assistive Device Front wheel walker  Distance Ambulated (ft) 400 ft  Mobility Response Tolerated well  Mobility performed by Mobility specialist  $Mobility charge 1 Mobility    Pre-mobility:82 HR, 107/62 BP During mobility: 100 HR Post-mobility: 80 HR, 107/69 BP  Pt asx throughout ambulation. He required several verbal cues for proper positioning in the RW. Pt back in bed w/ wife in room after ambulation.   Pricilla Handler Mobility Specialist Mobility Specialist Phone: [phone]

## 2020-08-30 NOTE — Discharge Summary (Addendum)
Physician Discharge Summary  Patient ID: Anthony Hartman MRN: [id number] DOB/AGE: September 21, 1941 79 y.o.  Admit date: 08/22/2020 Discharge date: 08/31/2020  Admission Diagnoses:  Patient Active Problem List   Diagnosis Date Noted  . Unstable angina ([place]) 08/22/2020  . Severe aortic stenosis 11/22/2019  . Educated about COVID-19 virus infection 11/22/2019  . CAD (coronary artery disease), native coronary artery 04/22/2019  . Angina, class II ([place]) - New onset 04/22/2019  . Hyperlipidemia with target LDL less than 70; statin intolerant.  Now on Repatha 04/22/2019  . S/P lumbar laminectomy 04/04/2017  . Malignant neoplasm of prostate ([place]) 03/08/2016  . Complicated UTI (urinary tract infection) 04/21/2013  . Essential hypertension 04/21/2013  . Urinary bladder stone 04/21/2013   Discharge Diagnoses:   Patient Active Problem List   Diagnosis Date Noted    . S/P CABG x 4 08/26/2020   . Unstable angina ([place])   . Severe aortic stenosis 11/22/2019  . Educated about COVID-19 virus infection 11/22/2019  . CAD (coronary artery disease), native coronary artery 04/22/2019  . Angina, class II ([place]) - New onset 04/22/2019  . Hyperlipidemia with target LDL less than 70; statin intolerant.  Now on Repatha 04/22/2019  . S/P lumbar laminectomy 04/04/2017  . Malignant neoplasm of prostate ([place]) 03/08/2016  . Complicated UTI (urinary tract infection) 04/21/2013  . Essential hypertension 04/21/2013  . Urinary bladder stone 04/21/2013   Non-ST Segment MI: High Sensitivity Trop l [id number]                08/22/2020  Discharged Condition: good  History of Present Illness:    [place] in [place], [place] with his wife. He is very active working outside. He has had regular dental work with no ongoing issues.  He plays golf a few times per week.   He has been followed by Dr. Percival Spanish for CAD and aortic stenosis. His cardiac history dates back to 2012 when he was found to have an occluded  OM, which was managed medically. He then presented with an inferior MI in 2017 and underwent PCI/BMS to RCA. Cath in 2018 showed an occluded ramus that could not be crossed. He then represented in 04/2019 with [place]. Cath at that time showed 95% 2nd diagonal stenosis managed with balloon angioplasty, with medically managed patent RCA stent with 25% ISR, 99% OM1 stenosis (essentially a CTO), 25% mLCx stenosis, 25% pLAD stenosis, 50% mLAD stenosis, with EF 55-65% and moderate AS.   He underwent ETT in 05/2020 which did not show any high risk findings, although it was a submaximal study. Echo in 05/2020 showed EF 55-60%, mod LVH, and severe AS with a mean gradient of 46, DVI 0.2, AVA 0.7. He was seen in the office by Dr. Percival Spanish and felt to be minimally symptomatic and close follow up was recommended.    He was in his usual state of health until the early morning of 10/11 when he was awoken from sleep with chest pain. EMS was activated and he was brought to [facility]. Labs showed Cr 1.5>1.4, Hgb 18, PLT 187, LDL 68, HsTrop 247--> 736--> 1625. EKG with sinus rhythm, rate 64 bpm, chronic RBBB, new inferolateral TWI, submm STE in aVR, V1-3. CXR without acute findings. Respiratory panel negative for influenza/COVID-19. He was given SL nitro x2 and aspirin en route to the ED and  IV fentanyl with resolution of chest pain upon arrival. He was started on a heparin gtt and admitted by cardiology.    Repeat echo 10/11  showed EF 50-55%, mod concentric LVH, mild hypokinesis of LV, basal anterior wasll, anterolateral wall and inferolateral wall, severe LAE, mild-mod RAE, moderate MAC, severe AS with mean gradient of 42 mm hg, peak gradient 69.6 mm hg, AVA 0.65 cm2, DVI 0.17, moderate AI and moderate PR. L/RHC 10/12 showed severe mid LAD stenosis at the takeoff of the large Diagonal branch (LAD lesion is flow limiting by pressure wire (DFR 0.84)). The Diagonal ostial stenosis has recurred following balloon angioplasty in June 2020. He  has had a functional occlusion of an obtuse marginal branch for years with prior failed PCI of this branch. It was felt he would need surgical AVR with bypass vs TAVR and high risk PCI. The intervention in the LAD and Diagonal would be difficult and there is a chance we would not have an optimal result with bifurcation stenting or primary stenting of the LAD with angioplasty alone of the Diagonal.   Given multivessel CAD and severe AS, cardiothoracic surgery was consulted for coronary revascularization and AVR options.    He reports that he has had fatigue over the past year but has been to high activity level working in his yard and playing golf.  He has had occasional episodes of chest discomfort particularly when he is doing heavy work in his yard.  These have been very mild compared to the episode that brought him to the hospital.  He denies any shortness of breath.  He has had no dizziness or syncope.    1. Hospital course: The patient was medically stabilized and after full cardiology evaluation including echocardiogram and catheterization as described above it was determined that he should undergo CABG/AVR.  On 08/26/2020 he was taken to the operating room where he underwent: Median Sternotomy 2. Extracorporeal circulation 3.   Coronary artery bypass grafting x 5    Left internal mammary artery graft to the LAD  SVG to diagonal  Sequential SVG to OM1 and OM2  SVG to PDA. 4.   Endoscopic vein harvest from the right leg 5.   Aortic valve replacement using a 25 mm Edwards INSPIRIS RESILIA Pericardial valve.  He tolerated the procedure well was taken to the surgical intensive care unit in stable condition.  Postoperative hospital course  The patient has done well.  He was extubated using standard post cardiac surgical protocols without difficulty.  He is remained neurologically intact.  He has maintained normal sinus rhythm.  He does have some postoperative volume overload but is  responding well to diuretics.  Blood sugars have been under good control using standard protocols.  All routine lines, monitors and drainage devices have been discontinued in the standard fashion.  On postop day 3 he was transferred to 4 E.  He is started on a course of routine post cardiac surgical rehabilitation protocols.  He remains in NSR and his pacing wires were removed without difficulty.  He developed complaints of bladder spasms and dysuria post foley removal.  He was voiding but felt like he had to force the urine to come out.  He was started on Cardura for this and Ditropan XL.  He will follow up with Urologist in the next several weeks.  He is ambulating independently.  His surgical incisions are healing without evidence of infection.  He does have a swollen area on the distal end of his RLE [facility] site. This is not infected, but could be early seroma.  He is medically stable for discharge home today.    Significant Diagnostic  Studies: angiography:    Severe aortic stenosis with recent progression in symptoms causing significant limitation in physical activity.  Unable to cross the aortic valve for hemodynamic assessment.  [place] 111 mid vessel bifurcation stenosis.  The previously dilated diagonal branch has restenosed to greater than 90%.  The previously untreated LAD has DFR of 0.84.  Obtuse marginal #1 with 99% stenosis with competitive flow.  This artery functions as a total occlusion and has had 2 failed PCI attempts in the past.  Left main is widely patent  RCA is dominant, tortuous, but does not have any focal high-grade stenosis.  There is diffuse disease throughout the vessel up to 40 to 50%.  Normal pulmonary artery pressures.  Pulmonary capillary wedge mean pressure is 9 mmHg.  RECOMMENDATIONS:   Symptomatic aortic stenosis and angina due to significant obtuse marginal and LAD diagonal disease.  Heart valve team consideration of complex PCI followed by TAVR versus aortic  valve replacement along with coronary bypass grafting to the LAD, diagonal, and first obtuse marginal.  Discharge Exam: Blood pressure 90/69, pulse 78, temperature 97.8 F (36.6 C), temperature source Oral, resp. rate 18, height _0  (1.93 m), weight 103.4 kg, SpO2 95 %.  General appearance: alert, cooperative and no distress Heart: regular rate and rhythm Lungs: clear to auscultation bilaterally Abdomen: soft, non-tender; bowel sounds normal; no masses,  no organomegaly Extremities: edema 1+ Wound: clean and dry, EVH site with swelling at distal end.. could be hematoma vs. Early seroma no evidence of infection   Discharge Medications:  The patient has been discharged on:   1.Beta Blocker:  Yes [ X  ]                              No   [   ]                              If No, reason:  2.Ace Inhibitor/ARB: Yes [   ]                                     No  [   x ]                                     If No, reason: labile BP  3.Statin:   Yes [   ]                  No  [  X ]                  If No, reason: allergy/intolerance  4.[place]:  Yes  [ x  ]                  No   [   ]                  If No, reason:     Discharge Instructions    AMB Referral to Cardiac Rehabilitation - Phase II   Complete by: As directed    Diagnosis:  CABG Valve Replacement     Valve: Aortic   CABG X ___: 4   After initial evaluation and assessments completed: Virtual  Based Care may be provided alone or in conjunction with Phase 2 Cardiac Rehab based on patient barriers.: Yes     Allergies as of 08/31/2020      Reactions   Statins    MD ORDERS UNSPECIFIED REACTION     Augmentin [amoxicillin-pot Clavulanate] Nausea And Vomiting   Has patient had a PCN reaction causing immediate rash, facial/tongue/throat swelling, SOB or lightheadedness with hypotension: No Has patient had a PCN reaction causing severe rash involving mucus membranes or skin necrosis: No Has patient had a PCN reaction  that required hospitalization No Has patient had a PCN reaction occurring within the last 10 years: No If all of the above answers are "NO", then may proceed with Cephalosporin use.   Dexamethasone Other (See Comments)   Frequent Urination [? HYPERGLYCEMIA? ]   Sulfa Antibiotics Nausea And Vomiting      Medication List    STOP taking these medications   cefUROXime 500 MG tablet Commonly known as: CEFTIN   doxycycline 100 MG tablet Commonly known as: ADOXA   HYDROcodone-acetaminophen 5-325 MG tablet Commonly known as: NORCO/VICODIN   isosorbide mononitrate 60 MG 24 hr tablet Commonly known as: IMDUR   losartan 25 MG tablet Commonly known as: COZAAR   nitroGLYCERIN 0.4 MG SL tablet Commonly known as: NITROSTAT     TAKE these medications   acetaminophen 500 MG tablet Commonly known as: TYLENOL Take 500 mg by mouth every 6 (six) hours as needed (pain.).   ALPRAZolam 1 MG tablet Commonly known as: XANAX Take 0.5 mg by mouth at bedtime.   aspirin 325 MG EC tablet Take 1 tablet (325 mg total) by mouth daily.   buPROPion 300 MG 24 hr tablet Commonly known as: WELLBUTRIN XL Take 300 mg by mouth daily.   carvedilol 6.25 MG tablet Commonly known as: COREG Take 1 tablet (6.25 mg total) by mouth 2 (two) times daily with a meal.   clopidogrel 75 MG tablet Commonly known as: PLAVIX TAKE 1 TABLET BY MOUTH EVERY DAY   doxazosin 2 MG tablet Commonly known as: CARDURA Take 1 tablet (2 mg total) by mouth daily.   famotidine 20 MG tablet Commonly known as: PEPCID Take 20 mg by mouth 2 (two) times daily as needed (acid reflux/indigestion.).   furosemide 40 MG tablet Commonly known as: LASIX Take 1 tablet (40 mg total) by mouth daily.   Lubricant Eye Drops 0.4-0.3 % Soln Generic drug: Polyethyl Glycol-Propyl Glycol Place 1 drop into both eyes 3 (three) times daily as needed (dry/irritated eyes).   meloxicam 7.5 MG tablet Commonly known as: Mobic Take 1 tablet (7.5 mg  total) by mouth daily as needed for pain.   multivitamin with minerals Tabs tablet Take 1 tablet by mouth daily.   Nexletol 180 MG Tabs Generic drug: Bempedoic Acid Take 180 mg by mouth daily.   oxybutynin 5 MG tablet Commonly known as: DITROPAN Take 0.5 tablets (2.5 mg total) by mouth every 8 (eight) hours as needed for bladder spasms.   oxyCODONE 5 MG immediate release tablet Commonly known as: Oxy IR/ROXICODONE Take 1-2 tablets (5-10 mg total) by mouth every 4 (four) hours as needed for severe pain.   potassium chloride SA 20 MEQ tablet Commonly known as: KLOR-CON Take 1 tablet (20 mEq total) by mouth daily.   Repatha SureClick 277 MG/ML [place] Generic drug: Evolocumab Inject 140 mg into the skin every 14 (fourteen) days.   testosterone cypionate 200 MG/ML injection Commonly known as: DEPOTESTOSTERONE CYPIONATE Inject 120  mg into the muscle every 14 (fourteen) days.   valACYclovir 1000 MG tablet Commonly known as: VALTREX Take 1,000 mg by mouth 3 (three) times daily as needed (fever blisters).            Durable Medical Equipment  (From admission, onward)         Start     Ordered   08/31/20 0915  For home use only DME 3 n 1  Once        08/31/20 0914   08/30/20 0732  For home use only DME Walker rolling  Once       Question Answer Comment  Walker: With 5 Inch Wheels   Patient needs a walker to treat with the following condition S/P CABG (coronary artery bypass graft)      08/30/20 0731          Follow-up Information    Minus Breeding, MD Follow up.   Specialty: Cardiology Why: Please see discharge paperwork for follow-up appointments with cardiology.  Appointments are also available on [place]. Contact information: [address] [place] [address] [phone]        Gaye Pollack, MD Follow up.   Specialty: Cardiothoracic Surgery Why: Please see discharge paperwork for follow-up appointments with surgeon.  On the date you see  Dr. Cyndia Bent please also obtain a chest x-ray at [facility] 1/2-hour prior to appointment.  It is located in the same office complex on the first floor. Contact information: [address] [place] [address] [phone]        [facility] Follow up on 09/06/2020.   Specialty: Cardiothoracic Surgery Why: Appointment with nurse for suture removal is at 10:00 Contact information: [place], [place] [phone]              Signed:   Ellwood Handler, PA-C 08/31/2020, 9:15 AM

## 2020-08-30 NOTE — Progress Notes (Signed)
Pacing wires removed per order. Vital signs stable. Patient tolerated well. Will continue to monitor.  Era Bumpers, RN

## 2020-08-31 ENCOUNTER — Telehealth: Payer: Self-pay | Admitting: Cardiology

## 2020-08-31 LAB — BASIC METABOLIC PANEL
Anion gap: 10 (ref 5–15)
BUN: 24 mg/dL — ABNORMAL HIGH (ref 8–23)
CO2: 26 mmol/L (ref 22–32)
Calcium: 9 mg/dL (ref 8.9–10.3)
Chloride: 99 mmol/L (ref 98–111)
Creatinine, Ser: 1.35 mg/dL — ABNORMAL HIGH (ref 0.61–1.24)
GFR, Estimated: 50 mL/min — ABNORMAL LOW (ref >60)
Glucose, Bld: 107 mg/dL — ABNORMAL HIGH (ref 70–99)
Potassium: 4.1 mmol/L (ref 3.5–5.1)
Sodium: 135 mmol/L (ref 135–145)

## 2020-08-31 MED ORDER — DOXAZOSIN MESYLATE 2 MG PO TABS
2.0000 mg | ORAL_TABLET | Freq: Every day | ORAL | 3 refills | Status: DC
Start: 2020-08-31 — End: 2021-06-26

## 2020-08-31 MED ORDER — OXYCODONE HCL 5 MG PO TABS
5.0000 mg | ORAL_TABLET | ORAL | 0 refills | Status: DC | PRN
Start: 2020-08-31 — End: 2020-09-12

## 2020-08-31 MED ORDER — FUROSEMIDE 40 MG PO TABS
40.0000 mg | ORAL_TABLET | Freq: Every day | ORAL | 0 refills | Status: DC
Start: 2020-08-31 — End: 2020-12-27

## 2020-08-31 MED ORDER — CLOPIDOGREL BISULFATE 75 MG PO TABS
75.0000 mg | ORAL_TABLET | Freq: Every day | ORAL | 0 refills | Status: DC
Start: 2020-08-31 — End: 2020-09-28

## 2020-08-31 MED ORDER — OXYBUTYNIN CHLORIDE 5 MG PO TABS: 2.5000 mg | ORAL_TABLET | Freq: Three times a day (TID) | ORAL | 0 refills | Status: DC | PRN

## 2020-08-31 MED ORDER — POTASSIUM CHLORIDE CRYS ER 20 MEQ PO TBCR: 20.0000 meq | EXTENDED_RELEASE_TABLET | Freq: Every day | ORAL | 0 refills | Status: DC

## 2020-08-31 MED ORDER — ASPIRIN 325 MG PO TBEC
325.0000 mg | DELAYED_RELEASE_TABLET | Freq: Every day | ORAL | 0 refills | Status: DC
Start: 2020-08-31 — End: 2020-12-27

## 2020-08-31 NOTE — Telephone Encounter (Signed)
Con't Pt will discuss at upcoming appt with Dr Percival Spanish

## 2020-08-31 NOTE — Telephone Encounter (Signed)
Returned call to pt he states that he needs a refill for plavix. Refill sent to [facility] as requested

## 2020-08-31 NOTE — Progress Notes (Addendum)
      [address]       [place] [address]             [phone]      5 Days Post-Op Procedure(s) (LRB): CORONARY ARTERY BYPASS GRAFTING (CABG) TIMES FIVE, USING LEFT INTERNAL MAMMARY ARTERY AND RIGHT GREATER SAPHENOUS VEIN HARVESTED ENDOSCOPICALLY (N/A) AORTIC VALVE REPLACEMENT (AVR) USING INSPIRIS VALVE SIZE 25MM (N/A) TRANSESOPHAGEAL ECHOCARDIOGRAM (TEE) (N/A)   Subjective:  No new complaints.  States his bladder spasms has improved.  He will follow up with his Urologist as he always does anytime he has a foley.  He is ambulating independently.  + BM  Objective: Vital signs in last 24 hours: Temp:  [97.9 F (36.6 C)-98.3 F (36.8 C)] 97.9 F (36.6 C) (10/20 0444) Pulse Rate:  [75-93] 76 (10/20 0444) Cardiac Rhythm: Normal sinus rhythm;Bundle branch block (10/19 1945) Resp:  [11-20] 20 (10/20 0444) BP: (114-147)/(61-110) 141/77 (10/20 0444) SpO2:  [92 %-96 %] 95 % (10/20 0444) Weight:  [103.4 kg] 103.4 kg (10/20 0444)  Intake/Output from previous day: 10/19 0701 - 10/20 0700 In: 360 [P.O.:360] Out: 1700 [Urine:1700]  General appearance: alert, cooperative and no distress Heart: regular rate and rhythm Lungs: clear to auscultation bilaterally Abdomen: soft, non-tender; bowel sounds normal; no masses,  no organomegaly Extremities: edema 1+ Wound: clean and dry, EVH site with swelling at distal end.. could be hematoma vs. Early seroma no evidence of infection  Lab Results: Recent Labs    08/29/20 0115  WBC 9.2  HGB 13.3  HCT 39.6  PLT 125*   BMET:  Recent Labs    08/29/20 0115 08/31/20 0146  NA 132* 135  K 4.3 4.1  CL 99 99  CO2 24 26  GLUCOSE 154* 107*  BUN 23 24*  CREATININE 1.24 1.35*  CALCIUM 8.6* 9.0    PT/INR: No results for input(s): LABPROT, INR in the last 72 hours. ABG    Component Value Date/Time   PHART 7.368 08/26/2020 2319   HCO3 24.0 08/26/2020 2319   TCO2 25 08/26/2020 2319   ACIDBASEDEF 1.0 08/26/2020 2319    O2SAT 94.0 08/26/2020 2319   CBG (last 3)  Recent Labs    08/29/20 0607 08/29/20 1223 08/29/20 1548  GLUCAP 91 84 124*    Assessment/Plan: S/P Procedure(s) (LRB): CORONARY ARTERY BYPASS GRAFTING (CABG) TIMES FIVE, USING LEFT INTERNAL MAMMARY ARTERY AND RIGHT GREATER SAPHENOUS VEIN HARVESTED ENDOSCOPICALLY (N/A) AORTIC VALVE REPLACEMENT (AVR) USING INSPIRIS VALVE SIZE 25MM (N/A) TRANSESOPHAGEAL ECHOCARDIOGRAM (TEE) (N/A)  1. CV- NSR, occasional PVCs-on Lopressor 2. Pulm- no acute issues, continue IS 3. Renal- creatinine remains stable, weight is trending down, will taper lasix, continue potassium 4. Dysuria/Bladder spasms- improving, patient is voiding without significant difficulty, he will follow up with his Urologist in the next several weeks.. continue cardura, ditropan add prn 5. Dispo- patient stable, bladder spasms have improved, he is voiding without significant difficulty, he will follow up with his Urologist, maintaining NSR with occasional PVCs on Lopressor, patient is medically stable for discharge home today   LOS: 9 days    Ellwood Handler, PA-C 08/31/2020    Chart reviewed, patient examined, agree with above. He looks good and feels ready to go home. Wt is at preop.

## 2020-08-31 NOTE — Care Management Important Message (Signed)
Important Message  Patient Details  Name: [place] MRN: [id number] Date of Birth: November 20, 1940   Medicare Important Message Given:  Yes     Shelda Altes 08/31/2020, 9:26 AM

## 2020-08-31 NOTE — Progress Notes (Signed)
CARDIAC REHAB PHASE I   D/c education completed with pt and wife. Pt educated on site care and monitoring incision daily. Encouraged continued IS use, walks, and sternal precautions. Pt given in-the-tube sheet along with heart healthy diet. Reviewed restrictions and exercise guidelines. Will refer to CRP II [place].  [id number] Rufina Falco, RN BSN 08/31/2020 9:43 AM

## 2020-08-31 NOTE — Telephone Encounter (Signed)
Pt wife called and would like to know if pt is still suppose to be taking the clopidogrel (PLAVIX) 75 MG tablet [[id number]] ?    She would like to see if a nurse could call, they have some questions about pt med list   Best number -[phone] or [phone]

## 2020-08-31 NOTE — TOC Transition Note (Signed)
Transition of Care (TOC) - CM/SW Discharge Note Marvetta Gibbons RN, BSN Transitions of Care Unit 4E- RN Case Manager See Treatment Team for direct phone #    Patient Details  Name: CLAUD GOWAN MRN: [id number] Date of Birth: 01/14/1941  Transition of Care [facility]) CM/SW Contact:  Dawayne Patricia, RN Phone Number: 08/31/2020, 11:52 AM   Clinical Narrative:    Pt stable for transition home today, from home w/ wife. Orders placed for DME- RW and 3n1- per cardiac rehab- pt already has RW at home and only need 3n1 for discharge. Call made to [employer] line- for DME need- 3n1 to be delivered to room prior to discharge No other TOC needs noted.    Final next level of care: Home/Self Care Barriers to Discharge: No Barriers Identified   Patient Goals and CMS Choice     Choice offered to / list presented to : Patient  Discharge Placement               Home        Discharge Plan and Services   Discharge Planning Services: CM Consult Post Acute Care Choice: Durable Medical Equipment          DME Arranged: 3-N-1, Walker rolling DME Agency: [employer] Date DME Agency Contacted: 08/31/20 Time DME Agency Contacted: 0900 Representative spoke with at DME Agency: Adela Lank HH Arranged: NA [place] Agency: NA        Social Determinants of Health ([place]) Interventions     Readmission Risk Interventions No flowsheet data found.

## 2020-09-06 ENCOUNTER — Ambulatory Visit (INDEPENDENT_AMBULATORY_CARE_PROVIDER_SITE_OTHER): Payer: Self-pay | Admitting: *Deleted

## 2020-09-06 ENCOUNTER — Other Ambulatory Visit: Payer: Self-pay

## 2020-09-06 DIAGNOSIS — Z952 Presence of prosthetic heart valve: Secondary | ICD-10-CM

## 2020-09-06 DIAGNOSIS — Z4802 Encounter for removal of sutures: Secondary | ICD-10-CM

## 2020-09-06 DIAGNOSIS — Z951 Presence of aortocoronary bypass graft: Secondary | ICD-10-CM

## 2020-09-06 NOTE — Progress Notes (Signed)
Patient arrived for nurse visit to remove sutures post-CABG/AVR 08/31/20 by Dr. Cyndia Bent.  Three sutures removed with no signs or symptoms of infection noted.  Patient tolerated suture removal well.  Patient and family instructed to keep the incision site clean and dry. Patient and family acknowledged instructions given. Pt states he is still very sore, assured pt that this is normal and that it may take some time to go away.  All questions answered.

## 2020-09-08 ENCOUNTER — Other Ambulatory Visit: Payer: Self-pay

## 2020-09-08 ENCOUNTER — Ambulatory Visit (INDEPENDENT_AMBULATORY_CARE_PROVIDER_SITE_OTHER): Payer: Self-pay | Admitting: Physician Assistant

## 2020-09-08 ENCOUNTER — Ambulatory Visit
Admission: RE | Admit: 2020-09-08 | Discharge: 2020-09-08 | Disposition: A | Discharge status: Home / Self Care | Payer: PPO | Source: Ambulatory Visit | Attending: Surgery | Admitting: Surgery

## 2020-09-08 ENCOUNTER — Other Ambulatory Visit: Payer: Self-pay | Admitting: Surgery

## 2020-09-08 VITALS — BP 117/75 | HR 80 | Temp 97.8°F | Resp 20 | Ht 76.0 in | Wt 222.0 lb

## 2020-09-08 DIAGNOSIS — J449 Chronic obstructive pulmonary disease, unspecified: Secondary | ICD-10-CM | POA: Diagnosis not present

## 2020-09-08 DIAGNOSIS — Z951 Presence of aortocoronary bypass graft: Secondary | ICD-10-CM

## 2020-09-08 DIAGNOSIS — I350 Nonrheumatic aortic (valve) stenosis: Secondary | ICD-10-CM

## 2020-09-08 DIAGNOSIS — I2510 Atherosclerotic heart disease of native coronary artery without angina pectoris: Secondary | ICD-10-CM

## 2020-09-08 DIAGNOSIS — Z952 Presence of prosthetic heart valve: Secondary | ICD-10-CM

## 2020-09-08 NOTE — Patient Instructions (Signed)
Follow-up as discussed.

## 2020-09-08 NOTE — Progress Notes (Signed)
[address]       [place] [address]             [phone]      Anthony Hartman is a 79 y.o. male patient who underwent a CABG/AVR and was discharged on 10/20. He returns to the office today because he is having some coughing with occasional phlegm. He is also having trouble sleeping however, this seems to be due to his urinary retention and incomplete emptying. He is having some lower ext swelling which is only in the vein harvest leg.     1. S/P CABG (coronary artery bypass graft)   2. S/P AVR (aortic valve replacement)   3. Severe aortic stenosis   4. Coronary artery disease involving native heart without angina pectoris, unspecified vessel or lesion type    Past Medical History:  Diagnosis Date  . Anxiety   . Arthritis   . Bladder cancer ([place])   . Bladder stone   . BPH (benign prostatic hypertrophy)   . Depression   . GERD (gastroesophageal reflux disease)   . History of kidney stones   . History of myocardial infarction    07-17-2011--   MI W/ OCCLUDED OM1. 2017 occluded RCA treated with a bare-metal stent, high-grade ramus intermediate stenosis in 2018 with failed attempted PCI  . Hyperlipidemia   . Hypertension   . Myocardial infarction Anthony Hartman) 2012; 2017   2012 - noted Subtotal CTO of OM/RI;  09/2016 - Acute Infer STEMI - BMS x 2 mRCA (3.5 mm x 15 & 3.5 x 12 - tapered 4.0 - 3.8 mm)  . OSA (obstructive sleep apnea)    cpap non-compliant   . Prostate cancer ([place])    No past surgical history pertinent negatives on file. Scheduled Meds: Current Outpatient Medications on File Prior to Visit  Medication Sig Dispense Refill  . acetaminophen (TYLENOL) 500 MG tablet Take 500 mg by mouth every 6 (six) hours as needed (pain.).    Marland Kitchen ALPRAZolam (XANAX) 1 MG tablet Take 0.5 mg by mouth at bedtime.    Marland Kitchen aspirin EC 325 MG EC tablet Take 1 tablet (325 mg total) by mouth daily. 30 tablet 0  . Bempedoic Acid (NEXLETOL) 180 MG TABS Take 180 mg by mouth daily. 90  tablet 1  . buPROPion (WELLBUTRIN XL) 300 MG 24 hr tablet Take 300 mg by mouth daily.    . carvedilol (COREG) 6.25 MG tablet Take 1 tablet (6.25 mg total) by mouth 2 (two) times daily with a meal. 180 tablet 3  . clopidogrel (PLAVIX) 75 MG tablet Take 1 tablet (75 mg total) by mouth daily. 30 tablet 0  . doxazosin (CARDURA) 2 MG tablet Take 1 tablet (2 mg total) by mouth daily. 30 tablet 3  . Evolocumab (REPATHA SURECLICK) 841 MG/ML SOAJ Inject 140 mg into the skin every 14 (fourteen) days. 2 pen 11  . famotidine (PEPCID) 20 MG tablet Take 20 mg by mouth 2 (two) times daily as needed (acid reflux/indigestion.).     Marland Kitchen furosemide (LASIX) 40 MG tablet Take 1 tablet (40 mg total) by mouth daily. (Patient not taking: Reported on 09/08/2020) 5 tablet 0  . meloxicam (MOBIC) 7.5 MG tablet Take 1 tablet (7.5 mg total) by mouth daily as needed for pain. 30 tablet 3  . Multiple Vitamin (MULTIVITAMIN WITH MINERALS) TABS tablet Take 1 tablet by mouth daily.    Marland Kitchen oxybutynin (DITROPAN) 5 MG tablet Take 0.5 tablets (2.5 mg  total) by mouth every 8 (eight) hours as needed for bladder spasms. 30 tablet 0  . oxyCODONE (OXY IR/ROXICODONE) 5 MG immediate release tablet Take 1-2 tablets (5-10 mg total) by mouth every 4 (four) hours as needed for severe pain. 30 tablet 0  . Polyethyl Glycol-Propyl Glycol (LUBRICANT EYE DROPS) 0.4-0.3 % SOLN Place 1 drop into both eyes 3 (three) times daily as needed (dry/irritated eyes).    . potassium chloride SA (KLOR-CON) 20 MEQ tablet Take 1 tablet (20 mEq total) by mouth daily. (Patient not taking: Reported on 09/08/2020) 5 tablet 0  . testosterone cypionate (DEPOTESTOSTERONE CYPIONATE) 200 MG/ML injection Inject 120 mg into the muscle every 14 (fourteen) days.     . valACYclovir (VALTREX) 1000 MG tablet Take 1,000 mg by mouth 3 (three) times daily as needed (fever blisters).      No current facility-administered medications on file prior to visit.    Allergies  Allergen Reactions   . Statins     MD ORDERS UNSPECIFIED REACTION    . Augmentin [Amoxicillin-Pot Clavulanate] Nausea And Vomiting     Has patient had a PCN reaction causing immediate rash, facial/tongue/throat swelling, SOB or lightheadedness with hypotension: No Has patient had a PCN reaction causing severe rash involving mucus membranes or skin necrosis: No Has patient had a PCN reaction that required hospitalization No Has patient had a PCN reaction occurring within the last 10 years: No If all of the above answers are "NO", then may proceed with Cephalosporin use.   Marland Kitchen Dexamethasone Other (See Comments)    Frequent Urination [? HYPERGLYCEMIA? ]  . Sulfa Antibiotics Nausea And Vomiting   Active Problems:   * No active hospital problems. *  Blood pressure 117/75, pulse 80, temperature 97.8 F (36.6 C), temperature source Skin, resp. rate 20, height 6\' 4"  (1.93 m), weight 222 lb (100.7 kg), SpO2 95 %.  Subjective  Anthony Hartman presents today with his wife with concerns about his cough and chest soreness. He also voices concerns about his lower extremity swelling. He is also getting up several times during the night to use the bathroom.   Objective  Cor: NSR, no murmur Pulm: CTA bilaterally and in all fields Abd: + BS, no tenderness Wound: EVH site and sternal incision healing well Ext: 1+ pitting edema in the right ankle/foot (EVH leg), no edema in the left leg   CLINICAL DATA:  CABG.  EXAM: CHEST - 2 VIEW  COMPARISON:  08/28/2020  FINDINGS: Left chest tube is been removed. No pneumothorax or effusion.  CABG and aortic valve replacement. Heart size normal. Negative for heart failure. Lungs are hyperinflated without infiltrate or effusion. Apical scarring bilaterally.  IMPRESSION: COPD with hyperinflation. Negative for heart failure or effusion. No pneumothorax.   Electronically Signed   By: Franchot Gallo M.D.   On: 09/08/2020 13:40  Assessment & Plan   1. Cough-Mr.  Anthony Hartman is already taking mucinex but can increase dose to 1200mg  BID (2 tabs). His chest xray was reviewed with the patient and wife at the beside and there were no concerns for pleural effusion or pneumothorax. His wires are in good position and his chest is stable to palpation.   2. Chest incisional pain- He is encouraged to continue his pain medication as needed. He is encouraged to use his heart pillow for support while coughing or sneezing. We went over sternal precautions and he plans to try and use his legs more for movement instead of pushing/pulling with his upper extremity.  Sternum was stable during my exam.  3. Lower leg edema- He was on lasix after discharge but has finished his doses. I do not think this is due to fluid overload but rather due to the saphenous vein removal. He is encouraged to continue ambulating several times a day and I suggested ted hose or an ACE wrap if the swelling is bothering him. He is to elevate his legs while not ambulating.  This edema should resolve over the next few weeks.   4. Insomnia- He is waking up during the night every 2 hours but this is not due to his pain or cough but rather incomplete emptying of urine. He is on oxybutynin which I continued to encourage him to take until he sees his urologist next week. They might have some further suggestions. Some safe sleep aids are: Tylenol PM, Benadryl, and Melatonin.   Overall, the patient is doing very well. He should feel encouraged that most of these symptoms are common in the post-op period but certainly if he has any other concerns he is welcome to call our office. His follow-up should include: Urology next week and Dr. Cyndia Bent on 11/10.   Nicholes Rough, PA-C  Elgie Collard 09/08/2020

## 2020-09-12 ENCOUNTER — Other Ambulatory Visit: Payer: Self-pay | Admitting: Physician Assistant

## 2020-09-12 ENCOUNTER — Telehealth: Payer: Self-pay

## 2020-09-12 ENCOUNTER — Other Ambulatory Visit: Payer: Self-pay | Admitting: Surgery

## 2020-09-12 MED ORDER — OXYCODONE HCL 5 MG PO TABS: 5.0000 mg | ORAL_TABLET | Freq: Four times a day (QID) | ORAL | 0 refills | Status: DC | PRN

## 2020-09-12 NOTE — Progress Notes (Signed)
RX sent via [place] as requested by Dr. Wyvonna Plum RN   Ellwood Handler, PA-C

## 2020-09-12 NOTE — Telephone Encounter (Signed)
Patient contacted the office with questions about pain and pain medication refill.  He is s/p CABG/ AVR with Dr. Cyndia Bent 08/26/20.  He stated that he has started to have increasingly more pain/ achy feeling in his shoulders that has started about 5- 6 days ago.  He stated that he does think he has done anything to cause pain, but feels like "it draws me in".  Advised that after major surgery, pain/ soreness is normal and hopefully with time and movement it should get better.  Advised that he could use warm compress to help with soreness.  He acknowledged receipt.  Dr. Cyndia Bent did approve pain medication refill.  He acknowledged receipt.

## 2020-09-15 ENCOUNTER — Telehealth: Payer: Self-pay

## 2020-09-15 NOTE — Telephone Encounter (Signed)
Patient's wife, Hassan Rowan contacted the office concerned about her husband and congestion in his chest.  She stated that it has not gotten any better since he was here a week ago despite increasing Mucinex advised by  Nicholes Rough, PA. Chest xray was done at his last appointment with [facility].  Advised patient's wife to contact his PCP, Dr. Unk Lightning to be further evaluated for continued congestion.  She acknowledged receipt.

## 2020-09-15 NOTE — Progress Notes (Signed)
Cardiology Office Note   Date:  09/18/2020   ID:  Anthony Hartman, DOB [birth year], MRN [id number]  PCP:  Myrlene Broker, MD  Cardiologist:   Minus Breeding, MD    Chief Complaint  Patient presents with  . Back Pain  . Cough      History of Present Illness: Anthony Hartman is a 79 y.o. male who presents for evaluation of CAD.  In 2012 he had an occluded OM.  He managed medically .  He did have an inferior MI in November 2017 and had a bare-metal stent to his right coronary artery.  This was all in [facility].   He had a cath in 2018 and he had an occluded ramus that they could not cross.  He was managed medically.  He presented in June with unstable angina and he had PCI of a diagonal.  He called this summer with chest pain.  He had a submax stress test without ischemia.  He subsequently presented with unstable angina and was found to have progressive coronary disease.  He had an echo and the AS was severe. He is now remarkably AVR/CABG.   He has had a slow recovery.  He said a lot of pain in his upper shoulders.  He is always had a lot of back pain issues before surgery.  He has had some sternal discomfort.  He finds pain in his shoulders and his chest when he coughs.  He has some thick white sputum that he has trouble coughing up.  He did go back to see Dr. Vivi Martens office.  I see a chest x-ray that demonstrated no evidence of infiltrate or other acute abnormalities.  He is not describing fevers or chills.  He is not having any PND or orthopnea.  He has had no weight gain or edema.  He tried Mucinex to loosen up his cough but is having no significant results with this.  Past Medical History:  Diagnosis Date  . Anxiety   . Arthritis   . Bladder cancer ([place])   . Bladder stone   . BPH (benign prostatic hypertrophy)   . Depression   . GERD (gastroesophageal reflux disease)   . History of kidney stones   . History of myocardial infarction    07-17-2011--   MI W/ OCCLUDED  OM1. 2017 occluded RCA treated with a bare-metal stent, high-grade ramus intermediate stenosis in 2018 with failed attempted PCI  . Hyperlipidemia   . Hypertension   . Myocardial infarction [facility]) 2012; 2017   2012 - noted Subtotal CTO of OM/RI;  09/2016 - Acute Infer STEMI - BMS x 2 mRCA (3.5 mm x 15 & 3.5 x 12 - tapered 4.0 - 3.8 mm)  . OSA (obstructive sleep apnea)    cpap non-compliant   . Prostate cancer [facility])     Past Surgical History:  Procedure Laterality Date  . AORTIC VALVE REPLACEMENT N/A 08/26/2020   Procedure: AORTIC VALVE REPLACEMENT (AVR) USING INSPIRIS VALVE SIZE 25MM;  Surgeon: Gaye Pollack, MD;  Location: [place];  Service: Open Heart Surgery;  Laterality: N/A;  . ARTHRODESIS METATARSALPHALANGEAL JOINT (MTPJ) Right 02/20/2018   Procedure: Right Hallux Metatarsal Phalangeal Joint Arthrodesis; Right Silver Bunionectomy;  Surgeon: Wylene Simmer, MD;  Location: [place];  Service: Orthopedics;  Laterality: Right;  . BACK SURGERY    . CARDIOVASCULAR STRESS TEST  04/22/11  . CORONARY ANGIOPLASTY  03/2017    failed PCI RI (OM1) 2018 --  similar to 2012.  . CORONARY ARTERY BYPASS GRAFT N/A 08/26/2020   Procedure: CORONARY ARTERY BYPASS GRAFTING (CABG) TIMES FIVE, USING LEFT INTERNAL MAMMARY ARTERY AND RIGHT GREATER SAPHENOUS VEIN HARVESTED ENDOSCOPICALLY;  Surgeon: Gaye Pollack, MD;  Location: [place];  Service: Open Heart Surgery;  Laterality: N/A;  . CORONARY STENT INTERVENTION  09/2016   [facility] (Acute Inferior STEMI)- Acute mid RCA 100% --> Integrity BMS 3.5 mm x 38m (3.8 mm) & 3.5 mm x 12 mm (4.1 mm) --BMS used because of need for back surgery  . CORONARY STENT INTERVENTION N/A 05/01/2019   Procedure: CORONARY STENT INTERVENTION;  Surgeon: VJettie Booze MD;  [facility];  Service: Cardiovascular;  Laterality: N/A;  . CYSTO/ BLADDER BX'S  X2  2001  / X1  2003  . CYSTO/ BLADDER BX'S/ BILATERAL RETROGRADE URETEROPYLEGRAM  2003;  2007; 2008;  12-30-2007   CARCINOMA IN SITU OF BLADDER  . CYSTOSCOPY WITH LITHOLAPAXY N/A 05/07/2013   Procedure: CYSTOSCOPY WITH LITHOLAPAXY;  Surgeon: SFranchot Gallo MD;  [facility];  Service: Urology;  Laterality: N/A;  . EHL Lengthening Right 05/10/2017   RT FOOT  . Hammer Toe Repair Right 05/10/2017   Rt #2 Toe  . HAMMERTOE RECONSTRUCTION WITH WEIL OSTEOTOMY Right 02/20/2018   Procedure: Right Second Metatarsal WPriscille Heidelbergand Revision Hammertoe Correction;  Surgeon: HWylene Simmer MD;  [place]  Service: Orthopedics;  Laterality: Right;  . INTRAVASCULAR PRESSURE WIRE/FFR STUDY N/A 08/23/2020   Procedure: INTRAVASCULAR PRESSURE WIRE/FFR STUDY;  Surgeon: SBelva Crome MD;  [place] [facility];  Service: Cardiovascular;  Laterality: N/A;  . LEFT HEART CATH AND CORONARY ANGIOGRAPHY  07/17/2011   OCCLUDED OM1 WITH UNSUCCESSFUL PCI ATTEMPT, O/W MILD TO MODERATE DISEASE, DR ANDY CHIU ([facility])    . LEFT HEART CATH AND CORONARY ANGIOGRAPHY  09/2016   Acute Inferior STEMI ([facility])  - Mid LAD 30%, D2 50%.  OM 1/RI 100%, mid circumflex 30%.  Acute mRCA 100% (thrombotic) - BMS PCI.  EF 50% with inferobasal hypokinesis.  .Marland KitchenLEFT HEART CATH AND CORONARY ANGIOGRAPHY  03/2017   (Dr. RChuck Hint- [facility]): OM(RI) ~ CTO (unfixable PCI unable to cross with wire/balloon. pLAD 40%, ost D1 40%;  prox-mid RCA 15%.  Mild stenosis with small mid LCx.  EF 60%.  .Marland KitchenLEFT HEART CATH AND CORONARY ANGIOGRAPHY N/A 05/01/2019   Procedure: LEFT HEART CATH AND CORONARY ANGIOGRAPHY;  Surgeon: VJettie Booze MD;  [facility];  Service: Cardiovascular;  Laterality: N/A;  . LUMBAR DECOMPRESSION FORAMINOTOMY L3 -- L5/  REMOVAL CYST L4-5 FACET JOINT  08-21-2009   ALSO HAD PRIOR BACK SURG IN 1992  . LUMBAR LAMINECTOMY  04/04/2017   L2-3  . LUMBAR LAMINECTOMY/DECOMPRESSION MICRODISCECTOMY N/A 04/04/2017   Procedure: Laminectomy and  Foraminotomy - Lumbar Two-Three;  Surgeon: JEustace Moore MD;  [place]  Service: Neurosurgery;  Laterality: N/A;  . OPEN TENOTOMY OF FLEXOR 2ND AND 3RD RIGHT TOES  APRIL 2008  . PROSTATE BIOPSY    . RIGHT/LEFT HEART CATH AND CORONARY ANGIOGRAPHY N/A 08/23/2020   Procedure: RIGHT/LEFT HEART CATH AND CORONARY ANGIOGRAPHY;  Surgeon: SBelva Crome MD;  [place] [facility];  Service: Cardiovascular;  Laterality: N/A;  . TEE WITHOUT CARDIOVERSION N/A 08/26/2020   Procedure: TRANSESOPHAGEAL ECHOCARDIOGRAM (TEE);  Surgeon: BGaye Pollack MD;  [place]  Service: Open Heart Surgery;  Laterality: N/A;  .  TRANSTHORACIC ECHOCARDIOGRAM  07/17/2011   at [facility], MILD TO MODERATE CONCENTRIC LVH/ NORMAL LVSF/ EF 55-60%/ MILD AORTIC STENOSIS  . TRANSTHORACIC ECHOCARDIOGRAM  06/2018   ([place]): Normal LV function/thickness.  EF 50 to 55%.  Normal wall motion.  Mild to moderate MAC.  Moderate to Severe Aortic Stenosis (calcified).  Mild aortic insufficiency.  . TRANSURETHRAL RESECTION OF BLADDER TUMOR  01-10-2004  . TRANSURETHRAL RESECTION OF PROSTATE N/A 05/07/2013   Procedure: TRANSURETHRAL RESECTION OF THE PROSTATE WITH GYRUS INSTRUMENTS;  Surgeon: Franchot Gallo, MD;  [facility];  Service: Urology;  Laterality: N/A;     Current Outpatient Medications  Medication Sig Dispense Refill  . acetaminophen (TYLENOL) 500 MG tablet Take 500 mg by mouth every 6 (six) hours as needed (pain.).    Marland Kitchen ALPRAZolam (XANAX) 1 MG tablet Take 0.5 mg by mouth at bedtime.    Marland Kitchen aspirin EC 325 MG EC tablet Take 1 tablet (325 mg total) by mouth daily. 30 tablet 0  . Bempedoic Acid (NEXLETOL) 180 MG TABS Take 180 mg by mouth daily. 90 tablet 1  . buPROPion (WELLBUTRIN XL) 300 MG 24 hr tablet Take 300 mg by mouth daily.    . carvedilol (COREG) 6.25 MG tablet Take 1 tablet (6.25 mg total) by mouth 2 (two) times daily with a meal. 180 tablet 3  . clopidogrel (PLAVIX) 75 MG tablet Take  1 tablet (75 mg total) by mouth daily. 30 tablet 0  . doxazosin (CARDURA) 2 MG tablet Take 1 tablet (2 mg total) by mouth daily. 30 tablet 3  . Evolocumab (REPATHA SURECLICK) 536 MG/ML SOAJ Inject 140 mg into the skin every 14 (fourteen) days. 2 pen 11  . famotidine (PEPCID) 20 MG tablet Take 20 mg by mouth 2 (two) times daily as needed (acid reflux/indigestion.).     Marland Kitchen furosemide (LASIX) 40 MG tablet Take 1 tablet (40 mg total) by mouth daily. 5 tablet 0  . meloxicam (MOBIC) 7.5 MG tablet Take 1 tablet (7.5 mg total) by mouth daily as needed for pain. 30 tablet 3  . Multiple Vitamin (MULTIVITAMIN WITH MINERALS) TABS tablet Take 1 tablet by mouth daily.    Marland Kitchen oxybutynin (DITROPAN) 5 MG tablet Take 0.5 tablets (2.5 mg total) by mouth every 8 (eight) hours as needed for bladder spasms. 30 tablet 0  . oxyCODONE (OXY IR/ROXICODONE) 5 MG immediate release tablet Take 1 tablet (5 mg total) by mouth every 6 (six) hours as needed for severe pain. 30 tablet 0  . Polyethyl Glycol-Propyl Glycol (LUBRICANT EYE DROPS) 0.4-0.3 % SOLN Place 1 drop into both eyes 3 (three) times daily as needed (dry/irritated eyes).    . potassium chloride SA (KLOR-CON) 20 MEQ tablet Take 1 tablet (20 mEq total) by mouth daily. 5 tablet 0  . testosterone cypionate (DEPOTESTOSTERONE CYPIONATE) 200 MG/ML injection Inject 120 mg into the muscle every 14 (fourteen) days.     . valACYclovir (VALTREX) 1000 MG tablet Take 1,000 mg by mouth 3 (three) times daily as needed (fever blisters).      No current facility-administered medications for this visit.    Allergies:   Statins, Augmentin [amoxicillin-pot clavulanate], Dexamethasone, and Sulfa antibiotics   ROS:  Please see the history of present illness.   Otherwise, review of systems are positive for none.   All other systems are reviewed and negative.    PHYSICAL EXAM: VS:  BP (!) 144/82   Pulse 79   Ht _0  (1.93 m)  Wt 215 lb (97.5 kg)   SpO2 100%   BMI 26.17 kg/m  , BMI  Body mass index is 26.17 kg/m. GENERAL:  Well appearing NECK:  No jugular venous distention, waveform within normal limits, carotid upstroke brisk and symmetric, no bruits, no thyromegaly LUNGS:  Clear to auscultation bilaterally CHEST: Median sternotomy.  There is a small fluctuant tender area in the cephalad portion of the scar without drainage. HEART:  PMI not displaced or sustained,S1 and S2 within normal limits, no S3, no S4, no clicks, no rubs, soft apical systolic murmurs ABD:  Flat, positive bowel sounds normal in frequency in pitch, no bruits, no rebound, no guarding, no midline pulsatile mass, no hepatomegaly, no splenomegaly EXT:  2 plus pulses throughout, mild right leg edema, no cyanosis no clubbing   EKG:  EKG is  ordered today. Normal sinus rhythm, rate 88, right bundle branch block, no acute ST-T wave changes.  Recent Labs: 05/18/2020: TSH 1.960 08/22/2020: ALT 24 08/27/2020: Magnesium 2.0 08/29/2020: Hemoglobin 13.3; Platelets 125 08/31/2020: BUN 24; Creatinine, Ser 1.35; Potassium 4.1; Sodium 135    Lipid Panel    Component Value Date/Time   CHOL 136 08/22/2020 0542   CHOL 178 05/03/2020 1151   TRIG 73 08/22/2020 0542   HDL 53 08/22/2020 0542   HDL 63 05/03/2020 1151   CHOLHDL 2.6 08/22/2020 0542   VLDL 15 08/22/2020 0542   LDLCALC 68 08/22/2020 0542   LDLCALC 95 05/03/2020 1151      Wt Readings from Last 3 Encounters:  09/16/20 215 lb (97.5 kg)  09/08/20 222 lb (100.7 kg)  08/31/20 227 lb 14.4 oz (103.4 kg)        Intervention   Other studies Reviewed: Additional studies/ records that were reviewed today include:  Hospital recrods Review of the above records demonstrates:  Please see elsewhere in the note.     ASSESSMENT AND PLAN:  CAD:   The patient is having a slow recovery from his bypass.   We talked about conservative management for his backache.  I talked about the continued need for good pulmonary toilet.  I suggested saline nasal  spray.  He does not have any means of getting a saline nebulizer but could use hot steam.  He should continue with the higher dose Mucinex.   HTN: The blood pressure is at target.  No change in therapy  DYSLIPIDEMIA:    He is now on a PCSK9 inhibitor.  He will continue with meds as listed.    AS:   He is now status post AVR.  He will continue with post surgical therapy.  He is going to see Dr. Cyndia Bent next week and I have asked him to point out the slight fluctuance in the cephalad portion of his sternal wound.  This is subcutaneous and I do not think deep.  There is no sternal mobility and he is not having fevers or chills so I think this can wait till next week.  Current medicines are reviewed at length with the patient today.  The patient does not have concerns regarding medicines.  The following changes have been made:  None  Labs/ tests ordered today include:  None  Orders Placed This Encounter  Procedures  . EKG 12-Lead     Disposition:   FU with me in 3 months.  No I am here you 150s now.   Signed, Minus Breeding, MD  09/18/2020 9:39 AM    [facility]

## 2020-09-16 ENCOUNTER — Encounter: Payer: Self-pay | Admitting: Cardiology

## 2020-09-16 ENCOUNTER — Other Ambulatory Visit: Payer: Self-pay

## 2020-09-16 ENCOUNTER — Ambulatory Visit: Payer: PPO | Admitting: Cardiology

## 2020-09-16 VITALS — BP 144/82 | HR 79 | Ht 76.0 in | Wt 215.0 lb

## 2020-09-16 DIAGNOSIS — I25119 Atherosclerotic heart disease of native coronary artery with unspecified angina pectoris: Secondary | ICD-10-CM | POA: Diagnosis not present

## 2020-09-16 DIAGNOSIS — I350 Nonrheumatic aortic (valve) stenosis: Secondary | ICD-10-CM

## 2020-09-16 DIAGNOSIS — E785 Hyperlipidemia, unspecified: Secondary | ICD-10-CM

## 2020-09-16 DIAGNOSIS — I10 Essential (primary) hypertension: Secondary | ICD-10-CM

## 2020-09-16 NOTE — Patient Instructions (Signed)
Medication Instructions:  No changes *If you need a refill on your cardiac medications before your next appointment, please call your pharmacy*   Lab Work: None ordered If you have labs (blood work) drawn today and your tests are completely normal, you will receive your results only by: Marland Kitchen MyChart Message (if you have MyChart) OR . A paper copy in the mail If you have any lab test that is abnormal or we need to change your treatment, we will call you to review the results.   Testing/Procedures: None ordered   Follow-Up: At [facility], you and your health needs are our priority.  As part of our continuing mission to provide you with exceptional heart care, we have created designated Provider Care Teams.  These Care Teams include your primary Cardiologist (physician) and Advanced Practice Providers (APPs -  Physician Assistants and Nurse Practitioners) who all work together to provide you with the care you need, when you need it.  We recommend signing up for the patient portal called "MyChart".  Sign up information is provided on this After Visit Summary.  MyChart is used to connect with patients for Virtual Visits (Telemedicine).  Patients are able to view lab/test results, encounter notes, upcoming appointments, etc.  Non-urgent messages can be sent to your provider as well.   To learn more about what you can do with MyChart, go to [email or web address].    Your next appointment:   3 month(s)  The format for your next appointment:   In Person  Provider:   Minus Breeding, MD   Other Instructions None

## 2020-09-18 ENCOUNTER — Encounter: Payer: Self-pay | Admitting: Cardiology

## 2020-09-20 ENCOUNTER — Other Ambulatory Visit: Payer: Self-pay | Admitting: Surgery

## 2020-09-20 DIAGNOSIS — Z951 Presence of aortocoronary bypass graft: Secondary | ICD-10-CM

## 2020-09-21 ENCOUNTER — Ambulatory Visit
Admission: RE | Admit: 2020-09-21 | Discharge: 2020-09-21 | Disposition: A | Discharge status: Home / Self Care | Payer: PPO | Source: Ambulatory Visit | Attending: Surgery | Admitting: Surgery

## 2020-09-21 ENCOUNTER — Ambulatory Visit (INDEPENDENT_AMBULATORY_CARE_PROVIDER_SITE_OTHER): Payer: Self-pay | Admitting: Surgery

## 2020-09-21 ENCOUNTER — Encounter: Payer: Self-pay | Admitting: Surgery

## 2020-09-21 ENCOUNTER — Other Ambulatory Visit: Payer: Self-pay

## 2020-09-21 VITALS — BP 129/82 | HR 77 | Temp 97.7°F | Resp 20 | Ht 76.0 in | Wt 219.0 lb

## 2020-09-21 DIAGNOSIS — Z951 Presence of aortocoronary bypass graft: Secondary | ICD-10-CM

## 2020-09-21 DIAGNOSIS — Z952 Presence of prosthetic heart valve: Secondary | ICD-10-CM

## 2020-09-21 DIAGNOSIS — J439 Emphysema, unspecified: Secondary | ICD-10-CM | POA: Diagnosis not present

## 2020-09-21 MED ORDER — ONDANSETRON HCL 4 MG PO TABS: 4.0000 mg | ORAL_TABLET | Freq: Three times a day (TID) | ORAL | 1 refills | Status: DC | PRN

## 2020-09-21 NOTE — Progress Notes (Signed)
HPI: Patient returns for routine postoperative follow-up having undergone coronary artery bypass graft surgery x5 and aortic valve replacement using a 25 mm Edwards Inspiris pericardial valve on 08/26/2020. The patient's early postoperative recovery while in the hospital was notable for an uncomplicated postoperative course. He was seen back in our office on 09/08/2020 by Johann Capers and was felt to be doing well overall although he had complaints of some cough with phlegm, incisional pain, lower extremity edema, and difficulty sleeping all night.  He seems making progress but does complain of intermittent nausea which she says is particularly bad today.  He only took 1 tramadol after his prescription was filled because he thought that might be causing nausea but he has continued to have intermittent nausea.  He is on no new medications.  He has been eating.  His bowels have been moving.  He has been walking some without chest pain or shortness of breath.  He continues to have back pain from chronic back problems.   Current Outpatient Medications  Medication Sig Dispense Refill   acetaminophen (TYLENOL) 500 MG tablet Take 500 mg by mouth every 6 (six) hours as needed (pain.).     ALPRAZolam (XANAX) 1 MG tablet Take 0.5 mg by mouth at bedtime.     aspirin EC 325 MG EC tablet Take 1 tablet (325 mg total) by mouth daily. 30 tablet 0   Bempedoic Acid (NEXLETOL) 180 MG TABS Take 180 mg by mouth daily. 90 tablet 1   buPROPion (WELLBUTRIN XL) 300 MG 24 hr tablet Take 300 mg by mouth daily.     carvedilol (COREG) 6.25 MG tablet Take 1 tablet (6.25 mg total) by mouth 2 (two) times daily with a meal. 180 tablet 3   clopidogrel (PLAVIX) 75 MG tablet Take 1 tablet (75 mg total) by mouth daily. 30 tablet 0   doxazosin (CARDURA) 2 MG tablet Take 1 tablet (2 mg total) by mouth daily. 30 tablet 3   Evolocumab (REPATHA SURECLICK) 350 MG/ML SOAJ Inject 140 mg into the skin every 14 (fourteen) days. 2 pen  11   famotidine (PEPCID) 20 MG tablet Take 20 mg by mouth 2 (two) times daily as needed (acid reflux/indigestion.).      meloxicam (MOBIC) 7.5 MG tablet Take 1 tablet (7.5 mg total) by mouth daily as needed for pain. 30 tablet 3   Multiple Vitamin (MULTIVITAMIN WITH MINERALS) TABS tablet Take 1 tablet by mouth daily.     Polyethyl Glycol-Propyl Glycol (LUBRICANT EYE DROPS) 0.4-0.3 % SOLN Place 1 drop into both eyes 3 (three) times daily as needed (dry/irritated eyes).     testosterone cypionate (DEPOTESTOSTERONE CYPIONATE) 200 MG/ML injection Inject 120 mg into the muscle every 14 (fourteen) days.      valACYclovir (VALTREX) 1000 MG tablet Take 1,000 mg by mouth 3 (three) times daily as needed (fever blisters).      furosemide (LASIX) 40 MG tablet Take 1 tablet (40 mg total) by mouth daily. (Patient not taking: Reported on 09/21/2020) 5 tablet 0   ondansetron (ZOFRAN) 4 MG tablet Take 1 tablet (4 mg total) by mouth every 8 (eight) hours as needed for nausea. 20 tablet 1   oxyCODONE (OXY IR/ROXICODONE) 5 MG immediate release tablet Take 1 tablet (5 mg total) by mouth every 6 (six) hours as needed for severe pain. (Patient not taking: Reported on 09/21/2020) 30 tablet 0   No current facility-administered medications for this visit.    Physical Exam: BP 129/82  Pulse 77    Temp 97.7 F (36.5 C) (Skin)    Resp 20    Ht 6\' 4"  (1.93 m)    Wt 219 lb (99.3 kg)    SpO2 96% Comment: RA with mask on   BMI 26.66 kg/m  He looks well. Cardiac exam shows regular rate and rhythm with normal heart sounds.  There is no murmur. Lung exam is clear. The chest incision is intact.  There is a small fluctuant area in the upper portion of the incision that is nontender with no overlying erythema.  I suspect this is either a small seroma or some fluid from the subcuticular Vicryl suture dissolving.  There is currently no sign of infection. His right leg incision is healing well and there is no lower extremity  edema.  There is a small seroma inferior to the vein harvest incision in the right lower leg.  Diagnostic Tests:  CLINICAL DATA:  79 year old male with a history of CABG  EXAM: CHEST - 2 VIEW  COMPARISON:  09/08/2020  FINDINGS: Cardiomediastinal silhouette unchanged in size and contour. Surgical changes of median sternotomy CABG, and aortic valve repair.  Pleuroparenchymal thickening at the apex of the lungs, similar to the comparison. No pneumothorax. No pleural effusion. No confluent airspace disease.  Stigmata of emphysema, with increased retrosternal airspace, flattened hemidiaphragms, increased AP diameter, and hyperinflation on the AP view. Degenerative changes of the spine.  IMPRESSION: Chronic lung changes and emphysema without evidence of acute cardiopulmonary disease.  Surgical changes of median sternotomy, CABG, aortic valve repair   Electronically Signed   By: Corrie Mckusick D.O.   On: 09/21/2020 10:02   Impression:  Overall I think he is making good progress following his surgery.  His biggest complaint this time appears to be intermittent nausea of unclear etiology.  He is not on any new medications that might be contributing to it.  He has stopped taking tramadol.  He could have some gastritis.  He has been taking Pepcid which she is on chronically.  I wrote him prescription today for Zofran 4 mg p.o. every 8 hours as needed for nausea to see if that helps in the short-term.  I encouraged him to continue walking as much as possible.  I told him he can return to driving a car when he feels comfortable with that but should refrain from lifting anything heavier than 10 pounds for 3 months postoperatively.   Plan:  I will plan to see him back in 3 weeks to follow-up on his nausea and to check his chest incision.  He will call me immediately if he develops any erythema or tenderness along the chest incision.   Gaye Pollack, MD [facility] [phone]

## 2020-09-26 ENCOUNTER — Telehealth (HOSPITAL_COMMUNITY): Payer: Self-pay

## 2020-09-26 NOTE — Telephone Encounter (Signed)
Faxed cardiac rehab referral to [facility] per phase I.

## 2020-09-27 DIAGNOSIS — Z09 Encounter for follow-up examination after completed treatment for conditions other than malignant neoplasm: Secondary | ICD-10-CM | POA: Diagnosis not present

## 2020-09-27 DIAGNOSIS — R059 Cough, unspecified: Secondary | ICD-10-CM | POA: Diagnosis not present

## 2020-09-28 ENCOUNTER — Ambulatory Visit: Payer: PPO | Admitting: Surgery

## 2020-09-28 ENCOUNTER — Other Ambulatory Visit: Payer: Self-pay | Admitting: Cardiology

## 2020-10-03 DIAGNOSIS — K5900 Constipation, unspecified: Secondary | ICD-10-CM | POA: Diagnosis not present

## 2020-10-12 ENCOUNTER — Ambulatory Visit (INDEPENDENT_AMBULATORY_CARE_PROVIDER_SITE_OTHER): Payer: Self-pay | Admitting: Surgery

## 2020-10-12 ENCOUNTER — Encounter: Payer: Self-pay | Admitting: Surgery

## 2020-10-12 ENCOUNTER — Other Ambulatory Visit: Payer: Self-pay

## 2020-10-12 VITALS — BP 127/83 | HR 78 | Temp 97.8°F | Resp 20 | Ht 76.0 in | Wt 223.0 lb

## 2020-10-12 DIAGNOSIS — I2510 Atherosclerotic heart disease of native coronary artery without angina pectoris: Secondary | ICD-10-CM

## 2020-10-12 DIAGNOSIS — Z951 Presence of aortocoronary bypass graft: Secondary | ICD-10-CM

## 2020-10-12 DIAGNOSIS — I350 Nonrheumatic aortic (valve) stenosis: Secondary | ICD-10-CM

## 2020-10-12 DIAGNOSIS — Z952 Presence of prosthetic heart valve: Secondary | ICD-10-CM

## 2020-10-12 NOTE — Progress Notes (Signed)
HPI:  Patient returns for routine postoperative follow-up having undergone coronary artery bypass graft surgery x5 and aortic valve replacement using a 25 mm Edwards Inspiris pericardial valve on 08/26/2020. When I saw him on 11/10 he was still having a lot of nausea but that has resolved. Today he reports some pain at the bottom of his sternotomy incision over the xyphoid process and in his left shoulder which just started and he feels that it may be due to over exertion. He is ambulating without shortness of breath. He is mainly limited by his chronic back pain and degenerative arthritis.  Current Outpatient Medications  Medication Sig Dispense Refill  . acetaminophen (TYLENOL) 500 MG tablet Take 500 mg by mouth every 6 (six) hours as needed (pain.).    Marland Kitchen ALPRAZolam (XANAX) 1 MG tablet Take 0.5 mg by mouth at bedtime.    Marland Kitchen aspirin EC 325 MG EC tablet Take 1 tablet (325 mg total) by mouth daily. 30 tablet 0  . Bempedoic Acid (NEXLETOL) 180 MG TABS Take 180 mg by mouth daily. 90 tablet 1  . buPROPion (WELLBUTRIN XL) 300 MG 24 hr tablet Take 300 mg by mouth daily.    . carvedilol (COREG) 6.25 MG tablet Take 1 tablet (6.25 mg total) by mouth 2 (two) times daily with a meal. 180 tablet 3  . clopidogrel (PLAVIX) 75 MG tablet TAKE ONE TABLET BY MOUTH EVERY DAY 30 tablet 0  . doxazosin (CARDURA) 2 MG tablet Take 1 tablet (2 mg total) by mouth daily. 30 tablet 3  . Evolocumab (REPATHA SURECLICK) 382 MG/ML SOAJ Inject 140 mg into the skin every 14 (fourteen) days. 2 pen 11  . famotidine (PEPCID) 20 MG tablet Take 20 mg by mouth 2 (two) times daily as needed (acid reflux/indigestion.).     Marland Kitchen furosemide (LASIX) 40 MG tablet Take 1 tablet (40 mg total) by mouth daily. 5 tablet 0  . meloxicam (MOBIC) 7.5 MG tablet Take 1 tablet (7.5 mg total) by mouth daily as needed for pain. 30 tablet 3  . Multiple Vitamin (MULTIVITAMIN WITH MINERALS) TABS tablet Take 1 tablet by mouth daily.    . ondansetron  (ZOFRAN) 4 MG tablet Take 1 tablet (4 mg total) by mouth every 8 (eight) hours as needed for nausea. 20 tablet 1  . oxyCODONE (OXY IR/ROXICODONE) 5 MG immediate release tablet Take 1 tablet (5 mg total) by mouth every 6 (six) hours as needed for severe pain. 30 tablet 0  . Polyethyl Glycol-Propyl Glycol (LUBRICANT EYE DROPS) 0.4-0.3 % SOLN Place 1 drop into both eyes 3 (three) times daily as needed (dry/irritated eyes).    Marland Kitchen testosterone cypionate (DEPOTESTOSTERONE CYPIONATE) 200 MG/ML injection Inject 120 mg into the muscle every 14 (fourteen) days.     . valACYclovir (VALTREX) 1000 MG tablet Take 1,000 mg by mouth 3 (three) times daily as needed (fever blisters).      No current facility-administered medications for this visit.     Physical Exam: BP 127/83   Pulse 78   Temp 97.8 F (36.6 C) (Skin)   Resp 20   Ht 6\' 4"  (1.93 m)   Wt 223 lb (101.2 kg)   SpO2 97% Comment: RA  BMI 27.14 kg/m  He looks well Cardiac exam shows a regular rate and rhythm with normal valve sounds. There is no murmur Lung exam is clear. The chest incision is well-healed. The previous area of fluctuance in the upper portion of the incision has resolved. His  right leg incision is well-healed. There is no lower extremity edema.  Diagnostic Tests:  None today.  Impression:  I think he is making a good recovery after heart surgery. I think his pain over the xyphoid process and left shoulder is musculoskeletal. I encouraged him to continue ambulating as much as possible but asked him not to lift anything heavier than 10 lbs for 3 months postop.  Plan:  He will continue to follow up with cardiology and will return to see me if he has any problems with his incisions. He is scheduled for a follow up postop echo tomorrow.   Gaye Pollack, MD [facility] [phone]

## 2020-10-13 ENCOUNTER — Ambulatory Visit (HOSPITAL_COMMUNITY): Payer: PPO | Attending: Cardiovascular Disease

## 2020-10-13 DIAGNOSIS — I350 Nonrheumatic aortic (valve) stenosis: Secondary | ICD-10-CM | POA: Diagnosis not present

## 2020-10-13 DIAGNOSIS — I25119 Atherosclerotic heart disease of native coronary artery with unspecified angina pectoris: Secondary | ICD-10-CM | POA: Diagnosis not present

## 2020-10-13 LAB — ECHOCARDIOGRAM COMPLETE
AR max vel: 1.61 cm2
AV Area VTI: 1.82 cm2
AV Area mean vel: 1.71 cm2
AV Mean grad: 8 mmHg
AV Peak grad: 17.8 mmHg
Ao pk vel: 2.11 m/s
Area-P 1/2: 4.29 cm2
S' Lateral: 2.9 cm

## 2020-10-18 DIAGNOSIS — F419 Anxiety disorder, unspecified: Secondary | ICD-10-CM | POA: Diagnosis not present

## 2020-10-18 DIAGNOSIS — K219 Gastro-esophageal reflux disease without esophagitis: Secondary | ICD-10-CM | POA: Diagnosis not present

## 2020-10-18 DIAGNOSIS — E785 Hyperlipidemia, unspecified: Secondary | ICD-10-CM | POA: Diagnosis not present

## 2020-10-18 DIAGNOSIS — I252 Old myocardial infarction: Secondary | ICD-10-CM | POA: Diagnosis not present

## 2020-10-18 DIAGNOSIS — Z954 Presence of other heart-valve replacement: Secondary | ICD-10-CM | POA: Diagnosis not present

## 2020-10-18 DIAGNOSIS — Z951 Presence of aortocoronary bypass graft: Secondary | ICD-10-CM | POA: Diagnosis not present

## 2020-10-18 DIAGNOSIS — F329 Major depressive disorder, single episode, unspecified: Secondary | ICD-10-CM | POA: Diagnosis not present

## 2020-10-24 DIAGNOSIS — G4733 Obstructive sleep apnea (adult) (pediatric): Secondary | ICD-10-CM | POA: Diagnosis not present

## 2020-10-28 ENCOUNTER — Other Ambulatory Visit: Payer: Self-pay | Admitting: Cardiology

## 2020-10-31 DIAGNOSIS — M170 Bilateral primary osteoarthritis of knee: Secondary | ICD-10-CM | POA: Diagnosis not present

## 2020-10-31 DIAGNOSIS — G8929 Other chronic pain: Secondary | ICD-10-CM | POA: Diagnosis not present

## 2020-10-31 DIAGNOSIS — M25562 Pain in left knee: Secondary | ICD-10-CM | POA: Diagnosis not present

## 2020-10-31 DIAGNOSIS — M25561 Pain in right knee: Secondary | ICD-10-CM | POA: Diagnosis not present

## 2020-11-01 ENCOUNTER — Other Ambulatory Visit: Payer: Self-pay

## 2020-11-01 ENCOUNTER — Telehealth: Payer: Self-pay | Admitting: Cardiology

## 2020-11-01 MED ORDER — REPATHA SURECLICK 140 MG/ML ~~LOC~~ SOAJ
140.0000 mg | SUBCUTANEOUS | 11 refills | Status: DC
Start: 2020-11-01 — End: 2020-12-12

## 2020-11-01 MED ORDER — NEXLETOL 180 MG PO TABS: 180.0000 mg | ORAL_TABLET | Freq: Every day | ORAL | 1 refills | Status: DC

## 2020-11-01 NOTE — Telephone Encounter (Signed)
Returned a call to pt to let them know that their grant had expired and that we will send pt assistance forms for them to complete to see if they qualify to receive it free from the manufacturer. The pt voiced understanding [facility] - Medicare Access 10/24/2019 10/22/2020 Closed - Payments Allowed $2,500.00 $0.00 $1,266.59 $1,233.41

## 2020-11-01 NOTE — Telephone Encounter (Signed)
Can you look into this.  He had [employer] approved earlier this year - is it possible he ran thru all $2500 already?

## 2020-11-01 NOTE — Telephone Encounter (Signed)
Pt c/o medication issue:  1. Name of Medication: Bempedoic Acid (NEXLETOL) 180 MG TABS Evolocumab (REPATHA SURECLICK) 250 MG/ML SOAJ  2. How are you currently taking this medication (dosage and times per day)? As directed  3. Are you having a reaction (difficulty breathing--STAT)? No   4. What is your medication issue? Patient states he is unable to afford these medications and he would like to know if we are able to offer a coupon or assistance with purchasing. Please advise.

## 2020-11-01 NOTE — Telephone Encounter (Signed)
Will forward to pharm md to help

## 2020-11-01 NOTE — Telephone Encounter (Signed)
°*  STAT* If patient is at the pharmacy, call can be transferred to refill team.   1. Which medications need to be refilled? (please list name of each medication and dose if known) Evolocumab (REPATHA SURECLICK) 784 MG/ML SOAJ Bempedoic Acid (NEXLETOL) 180 MG TABS  2. Which pharmacy/location (including street and city if local pharmacy) is medication to be sent to? [place], [place]  3. Do they need a 30 day or 90 day supply? 90 day supply

## 2020-11-07 DIAGNOSIS — Z8546 Personal history of malignant neoplasm of prostate: Secondary | ICD-10-CM | POA: Diagnosis not present

## 2020-11-10 ENCOUNTER — Telehealth: Payer: Self-pay | Admitting: Cardiology

## 2020-11-10 NOTE — Telephone Encounter (Signed)
New Message:     Pt wants to know if he is supposed to be taking Isosorbide. He said it was not on his list, but the pharmacist had it on their list.

## 2020-11-10 NOTE — Telephone Encounter (Signed)
RN reviewed patient CHL charting. Per  Discharge  Summary  10 /19/21 from Hospitalization . Isosorbide as well as  The below list. cefUROXime 500 MG tablet Commonly known as: CEFTIN   doxycycline 100 MG tablet Commonly known as: ADOXA   HYDROcodone-acetaminophen 5-325 MG tablet Commonly known as: NORCO/VICODIN   isosorbide mononitrate 60 MG 24 hr tablet Commonly known as: IMDUR   losartan 25 MG tablet Commonly known as: COZAAR   nitroGLYCERIN 0.4 MG SL tablet Commonly known as: NITROSTAT    Rn informed patient of the changes and at patient appointment with Dr Percival Spanish reviewed medication and wanted patient to continue with current medication since being hospital. Patient verbalized understanding and has an up coming appointment in Feb 2022.

## 2020-11-11 ENCOUNTER — Other Ambulatory Visit: Payer: Self-pay | Admitting: Cardiology

## 2020-11-14 DIAGNOSIS — C670 Malignant neoplasm of trigone of bladder: Secondary | ICD-10-CM | POA: Diagnosis not present

## 2020-11-14 DIAGNOSIS — N35011 Post-traumatic bulbous urethral stricture: Secondary | ICD-10-CM | POA: Diagnosis not present

## 2020-11-14 DIAGNOSIS — C61 Malignant neoplasm of prostate: Secondary | ICD-10-CM | POA: Diagnosis not present

## 2020-11-15 ENCOUNTER — Telehealth: Payer: Self-pay | Admitting: Cardiology

## 2020-11-15 DIAGNOSIS — I129 Hypertensive chronic kidney disease with stage 1 through stage 4 chronic kidney disease, or unspecified chronic kidney disease: Secondary | ICD-10-CM | POA: Diagnosis not present

## 2020-11-15 DIAGNOSIS — L720 Epidermal cyst: Secondary | ICD-10-CM | POA: Diagnosis not present

## 2020-11-15 DIAGNOSIS — M25512 Pain in left shoulder: Secondary | ICD-10-CM | POA: Diagnosis not present

## 2020-11-15 DIAGNOSIS — L219 Seborrheic dermatitis, unspecified: Secondary | ICD-10-CM | POA: Diagnosis not present

## 2020-11-15 DIAGNOSIS — F3342 Major depressive disorder, recurrent, in full remission: Secondary | ICD-10-CM | POA: Diagnosis not present

## 2020-11-15 DIAGNOSIS — M19012 Primary osteoarthritis, left shoulder: Secondary | ICD-10-CM | POA: Diagnosis not present

## 2020-11-15 DIAGNOSIS — F330 Major depressive disorder, recurrent, mild: Secondary | ICD-10-CM | POA: Diagnosis not present

## 2020-11-15 DIAGNOSIS — N1831 Chronic kidney disease, stage 3a: Secondary | ICD-10-CM | POA: Diagnosis not present

## 2020-11-15 DIAGNOSIS — E1121 Type 2 diabetes mellitus with diabetic nephropathy: Secondary | ICD-10-CM | POA: Diagnosis not present

## 2020-11-15 DIAGNOSIS — J310 Chronic rhinitis: Secondary | ICD-10-CM | POA: Diagnosis not present

## 2020-11-15 DIAGNOSIS — J329 Chronic sinusitis, unspecified: Secondary | ICD-10-CM | POA: Diagnosis not present

## 2020-11-15 DIAGNOSIS — L570 Actinic keratosis: Secondary | ICD-10-CM | POA: Diagnosis not present

## 2020-11-15 NOTE — Telephone Encounter (Signed)
    *  STAT* If patient is at the pharmacy, call can be transferred to refill team.   1. Which medications need to be refilled? (please list name of each medication and dose if known)   clopidogrel (PLAVIX) 75 MG tablet    2. Which pharmacy/location (including street and city if local pharmacy) is medication to be sent to? [place], [place]  3. Do they need a 30 day or 90 day supply? 90 days   Per pharmacy refill received was only for 15 days. Pt's wife said pt needs 90 days supply

## 2020-11-16 DIAGNOSIS — K219 Gastro-esophageal reflux disease without esophagitis: Secondary | ICD-10-CM | POA: Diagnosis not present

## 2020-11-16 DIAGNOSIS — Z951 Presence of aortocoronary bypass graft: Secondary | ICD-10-CM | POA: Diagnosis not present

## 2020-11-16 DIAGNOSIS — F329 Major depressive disorder, single episode, unspecified: Secondary | ICD-10-CM | POA: Diagnosis not present

## 2020-11-16 DIAGNOSIS — I252 Old myocardial infarction: Secondary | ICD-10-CM | POA: Diagnosis not present

## 2020-11-16 DIAGNOSIS — F419 Anxiety disorder, unspecified: Secondary | ICD-10-CM | POA: Diagnosis not present

## 2020-11-16 DIAGNOSIS — E785 Hyperlipidemia, unspecified: Secondary | ICD-10-CM | POA: Diagnosis not present

## 2020-11-16 DIAGNOSIS — Z954 Presence of other heart-valve replacement: Secondary | ICD-10-CM | POA: Diagnosis not present

## 2020-11-17 DIAGNOSIS — M7542 Impingement syndrome of left shoulder: Secondary | ICD-10-CM | POA: Diagnosis not present

## 2020-11-17 DIAGNOSIS — M19012 Primary osteoarthritis, left shoulder: Secondary | ICD-10-CM | POA: Diagnosis not present

## 2020-11-25 ENCOUNTER — Other Ambulatory Visit: Payer: Self-pay | Admitting: Cardiology

## 2020-11-25 NOTE — Telephone Encounter (Signed)
°*  STAT* If patient is at the pharmacy, call can be transferred to refill team.  l Which medications need to be refilled? (please list name of each medication and dose if known)  Clopidogrel  2. Which pharmacy/location (including street and city if local pharmacy) is medication to be sent to? [facility] [place] [phone]  3. Do they need a 30 day or 90 day supply? [place]

## 2020-11-26 ENCOUNTER — Other Ambulatory Visit: Payer: Self-pay | Admitting: Cardiology

## 2020-12-02 DIAGNOSIS — J22 Unspecified acute lower respiratory infection: Secondary | ICD-10-CM | POA: Diagnosis not present

## 2020-12-02 DIAGNOSIS — J432 Centrilobular emphysema: Secondary | ICD-10-CM | POA: Diagnosis not present

## 2020-12-02 DIAGNOSIS — J449 Chronic obstructive pulmonary disease, unspecified: Secondary | ICD-10-CM | POA: Diagnosis not present

## 2020-12-02 DIAGNOSIS — R059 Cough, unspecified: Secondary | ICD-10-CM | POA: Diagnosis not present

## 2020-12-12 ENCOUNTER — Telehealth: Payer: Self-pay

## 2020-12-12 MED ORDER — REPATHA SURECLICK 140 MG/ML ~~LOC~~ SOAJ: 140.0000 mg | SUBCUTANEOUS | 3 refills | Status: AC

## 2020-12-12 NOTE — Telephone Encounter (Signed)
Called and spoke w/pt was able to get  Them approved for hw for nexletol and repatha. Pt voiced gratitude and understanding

## 2020-12-12 NOTE — Telephone Encounter (Signed)
-----   Message from Harrington Challenger, [place] sent at 12/12/2020  2:54 PM EST ----- Will you check with patient if he still getting this medication with [employer]?  thanks

## 2020-12-14 DIAGNOSIS — L219 Seborrheic dermatitis, unspecified: Secondary | ICD-10-CM | POA: Diagnosis not present

## 2020-12-14 DIAGNOSIS — L02414 Cutaneous abscess of left upper limb: Secondary | ICD-10-CM | POA: Diagnosis not present

## 2020-12-16 DIAGNOSIS — Z951 Presence of aortocoronary bypass graft: Secondary | ICD-10-CM | POA: Diagnosis not present

## 2020-12-16 DIAGNOSIS — Z955 Presence of coronary angioplasty implant and graft: Secondary | ICD-10-CM | POA: Diagnosis not present

## 2020-12-26 ENCOUNTER — Other Ambulatory Visit (HOSPITAL_COMMUNITY): Payer: PPO

## 2020-12-26 NOTE — Progress Notes (Signed)
Cardiology Office Note   Date:  12/27/2020   ID:  Anthony Hartman, DOB [birth year], MRN [id number]  PCP:  Myrlene Broker, MD  Cardiologist:   Minus Breeding, MD    Chief Complaint  Patient presents with  . Coronary Artery Disease      History of Present Illness: Anthony Hartman is a 80 y.o. male who presents for evaluation of CAD.  In 2012 he had an occluded OM.  He managed medically .  He did have an inferior MI in November 2017 and had a bare-metal stent to his right coronary artery.  This was all in [facility].   He had a cath in 2018 and he had an occluded ramus that they could not cross.  He was managed medically.  He presented in June with unstable angina and he had PCI of a diagonal.  He called this summer with chest pain.  He had a submax stress test without ischemia.  He subsequently presented with unstable angina and was found to have progressive coronary disease.  He had an echo and the AS was severe. He status post AVR/CABG.   Since I last saw him he has done okay.  He has been doing cardiac rehab.  He says he occasionally gets winded.  He has some mild incisional pain but overall he thinks he is continuing to recover. The patient denies any new symptoms such as chest discomfort, neck or arm discomfort. There has been no new shortness of breath, PND or orthopnea. There have been no reported palpitations, presyncope or syncope.   Past Medical History:  Diagnosis Date  . Anxiety   . Arthritis   . Bladder cancer ([place])   . Bladder stone   . BPH (benign prostatic hypertrophy)   . Depression   . GERD (gastroesophageal reflux disease)   . History of kidney stones   . History of myocardial infarction    07-17-2011--   MI W/ OCCLUDED OM1. 2017 occluded RCA treated with a bare-metal stent, high-grade ramus intermediate stenosis in 2018 with failed attempted PCI  . Hyperlipidemia   . Hypertension   . Myocardial infarction [facility]) 2012; 2017   2012 - noted Subtotal CTO  of OM/RI;  09/2016 - Acute Infer STEMI - BMS x 2 mRCA (3.5 mm x 15 & 3.5 x 12 - tapered 4.0 - 3.8 mm)  . OSA (obstructive sleep apnea)    cpap non-compliant   . Prostate cancer [facility])     Past Surgical History:  Procedure Laterality Date  . AORTIC VALVE REPLACEMENT N/A 08/26/2020   Procedure: AORTIC VALVE REPLACEMENT (AVR) USING INSPIRIS VALVE SIZE 25MM;  Surgeon: Gaye Pollack, MD;  Location: [place];  Service: Open Heart Surgery;  Laterality: N/A;  . ARTHRODESIS METATARSALPHALANGEAL JOINT (MTPJ) Right 02/20/2018   Procedure: Right Hallux Metatarsal Phalangeal Joint Arthrodesis; Right Silver Bunionectomy;  Surgeon: Wylene Simmer, MD;  Location: [place];  Service: Orthopedics;  Laterality: Right;  . BACK SURGERY    . CARDIOVASCULAR STRESS TEST  04/22/11  . CORONARY ANGIOPLASTY  03/2017    failed PCI RI (OM1) 2018 -- similar to 2012.  . CORONARY ARTERY BYPASS GRAFT N/A 08/26/2020   Procedure: CORONARY ARTERY BYPASS GRAFTING (CABG) TIMES FIVE, USING LEFT INTERNAL MAMMARY ARTERY AND RIGHT GREATER SAPHENOUS VEIN HARVESTED ENDOSCOPICALLY;  Surgeon: Gaye Pollack, MD;  Location: [place];  Service: Open Heart Surgery;  Laterality: N/A;  . CORONARY STENT INTERVENTION  09/2016   [facility] (Acute Inferior STEMI)- Acute mid RCA 100% --> Integrity BMS 3.5 mm x 40m (3.8 mm) & 3.5 mm x 12 mm (4.1 mm) --BMS used because of need for back surgery  . CORONARY STENT INTERVENTION N/A 05/01/2019   Procedure: CORONARY STENT INTERVENTION;  Surgeon: VJettie Booze MD;  [place];  Service: Cardiovascular;  Laterality: N/A;  . CYSTO/ BLADDER BX'S  X2  2001  / X1  2003  . CYSTO/ BLADDER BX'S/ BILATERAL RETROGRADE URETEROPYLEGRAM  2003; 2007; 2008;  12-30-2007   CARCINOMA IN SITU OF BLADDER  . CYSTOSCOPY WITH LITHOLAPAXY N/A 05/07/2013   Procedure: CYSTOSCOPY WITH LITHOLAPAXY;  Surgeon: SFranchot Gallo MD;  [facility];  Service: Urology;  Laterality: N/A;  .  EHL Lengthening Right 05/10/2017   RT FOOT  . Hammer Toe Repair Right 05/10/2017   Rt #2 Toe  . HAMMERTOE RECONSTRUCTION WITH WEIL OSTEOTOMY Right 02/20/2018   Procedure: Right Second Metatarsal WPriscille Heidelbergand Revision Hammertoe Correction;  Surgeon: HWylene Simmer MD;  [place]  Service: Orthopedics;  Laterality: Right;  . INTRAVASCULAR PRESSURE WIRE/FFR STUDY N/A 08/23/2020   Procedure: INTRAVASCULAR PRESSURE WIRE/FFR STUDY;  Surgeon: SBelva Crome MD;  [place] [facility];  Service: Cardiovascular;  Laterality: N/A;  . LEFT HEART CATH AND CORONARY ANGIOGRAPHY  07/17/2011   OCCLUDED OM1 WITH UNSUCCESSFUL PCI ATTEMPT, O/W MILD TO MODERATE DISEASE, DR ANDY CHIU ([facility])    . LEFT HEART CATH AND CORONARY ANGIOGRAPHY  09/2016   Acute Inferior STEMI ([facility])  - Mid LAD 30%, D2 50%.  OM 1/RI 100%, mid circumflex 30%.  Acute mRCA 100% (thrombotic) - BMS PCI.  EF 50% with inferobasal hypokinesis.  .Marland KitchenLEFT HEART CATH AND CORONARY ANGIOGRAPHY  03/2017   (Dr. RChuck Hint- [facility]): OM(RI) ~ CTO (unfixable PCI unable to cross with wire/balloon. pLAD 40%, ost D1 40%;  prox-mid RCA 15%.  Mild stenosis with small mid LCx.  EF 60%.  .Marland KitchenLEFT HEART CATH AND CORONARY ANGIOGRAPHY N/A 05/01/2019   Procedure: LEFT HEART CATH AND CORONARY ANGIOGRAPHY;  Surgeon: VJettie Booze MD;  [place] [facility] [place];  Service: Cardiovascular;  Laterality: N/A;  . LUMBAR DECOMPRESSION FORAMINOTOMY L3 -- L5/  REMOVAL CYST L4-5 FACET JOINT  08-21-2009   ALSO HAD PRIOR BACK SURG IN 1992  . LUMBAR LAMINECTOMY  04/04/2017   L2-3  . LUMBAR LAMINECTOMY/DECOMPRESSION MICRODISCECTOMY N/A 04/04/2017   Procedure: Laminectomy and Foraminotomy - Lumbar Two-Three;  Surgeon: JEustace Moore MD;  [place]  Service: Neurosurgery;  Laterality: N/A;  . OPEN TENOTOMY OF FLEXOR 2ND AND 3RD RIGHT TOES  APRIL 2008  . PROSTATE BIOPSY    . RIGHT/LEFT HEART CATH AND  CORONARY ANGIOGRAPHY N/A 08/23/2020   Procedure: RIGHT/LEFT HEART CATH AND CORONARY ANGIOGRAPHY;  Surgeon: SBelva Crome MD;  [place] [facility];  Service: Cardiovascular;  Laterality: N/A;  . TEE WITHOUT CARDIOVERSION N/A 08/26/2020   Procedure: TRANSESOPHAGEAL ECHOCARDIOGRAM (TEE);  Surgeon: BGaye Pollack MD;  [place]  Service: Open Heart Surgery;  Laterality: N/A;  . TRANSTHORACIC ECHOCARDIOGRAM  07/17/2011   at [facility], MILD TO MODERATE CONCENTRIC LVH/ NORMAL LVSF/ EF 55-60%/ MILD AORTIC STENOSIS  . TRANSTHORACIC ECHOCARDIOGRAM  06/2018   [facility]: Normal LV function/thickness.  EF 50 to 55%.  Normal wall motion.  Mild to moderate MAC.  Moderate to Severe Aortic Stenosis (calcified).  Mild aortic insufficiency.  .Marland Kitchen  TRANSURETHRAL RESECTION OF BLADDER TUMOR  01-10-2004  . TRANSURETHRAL RESECTION OF PROSTATE N/A 05/07/2013   Procedure: TRANSURETHRAL RESECTION OF THE PROSTATE WITH GYRUS INSTRUMENTS;  Surgeon: Franchot Gallo, MD;  [facility];  Service: Urology;  Laterality: N/A;     Current Outpatient Medications  Medication Sig Dispense Refill  . acetaminophen (TYLENOL) 500 MG tablet Take 500 mg by mouth every 6 (six) hours as needed (pain.).    Marland Kitchen ALPRAZolam (XANAX) 1 MG tablet Take 0.5 mg by mouth at bedtime.    Marland Kitchen aspirin EC 81 MG tablet Take 81 mg by mouth daily. Swallow whole.    . Bempedoic Acid (NEXLETOL) 180 MG TABS Take 180 mg by mouth daily. 90 tablet 1  . buPROPion (WELLBUTRIN XL) 300 MG 24 hr tablet Take 300 mg by mouth daily.    . carvedilol (COREG) 6.25 MG tablet Take 1 tablet (6.25 mg total) by mouth 2 (two) times daily with a meal. 180 tablet 3  . clopidogrel (PLAVIX) 75 MG tablet TAKE ONE TABLET BY MOUTH EVERY DAY 30 tablet 0  . doxazosin (CARDURA) 2 MG tablet Take 1 tablet (2 mg total) by mouth daily. 30 tablet 3  . Evolocumab (REPATHA SURECLICK) 196 MG/ML SOAJ Inject 140 mg into the skin every 14 (fourteen) days. 6 mL 3  .  famotidine (PEPCID) 20 MG tablet Take 20 mg by mouth 2 (two) times daily as needed (acid reflux/indigestion.).     Marland Kitchen meloxicam (MOBIC) 7.5 MG tablet Take 1 tablet (7.5 mg total) by mouth daily as needed for pain. 30 tablet 3  . Multiple Vitamin (MULTIVITAMIN WITH MINERALS) TABS tablet Take 1 tablet by mouth daily.    . ondansetron (ZOFRAN) 4 MG tablet Take 1 tablet (4 mg total) by mouth every 8 (eight) hours as needed for nausea. 20 tablet 1  . oxyCODONE (OXY IR/ROXICODONE) 5 MG immediate release tablet Take 1 tablet (5 mg total) by mouth every 6 (six) hours as needed for severe pain. 30 tablet 0  . Polyethyl Glycol-Propyl Glycol (LUBRICANT EYE DROPS) 0.4-0.3 % SOLN Place 1 drop into both eyes 3 (three) times daily as needed (dry/irritated eyes).    Marland Kitchen testosterone cypionate (DEPOTESTOSTERONE CYPIONATE) 200 MG/ML injection Inject 120 mg into the muscle every 14 (fourteen) days.     . valACYclovir (VALTREX) 1000 MG tablet Take 1,000 mg by mouth 3 (three) times daily as needed (fever blisters).      No current facility-administered medications for this visit.    Allergies:   Statins, Augmentin [amoxicillin-pot clavulanate], Dexamethasone, and Sulfa antibiotics   ROS:  Please see the history of present illness.   Otherwise, review of systems are positive for none.   All other systems are reviewed and negative.    PHYSICAL EXAM: VS:  BP 140/72   Pulse 77   Ht _0  (1.854 m)   Wt 225 lb 6.4 oz (102.2 kg)   SpO2 97%   BMI 29.74 kg/m  , BMI Body mass index is 29.74 kg/m. GENERAL:  Well appearing NECK:  No jugular venous distention, waveform within normal limits, carotid upstroke brisk and symmetric, no bruits, no thyromegaly LUNGS:  Clear to auscultation bilaterally CHEST:  Well healed sternotomy scar. HEART:  PMI not displaced or sustained,S1 and S2 within normal limits, no S3, no S4, no clicks, no rubs, soft apical systolic murmur radiating out the aortic left lower tract, no diastolic  murmurs ABD:  Flat, positive bowel sounds normal in frequency in pitch, no bruits, no  rebound, no guarding, no midline pulsatile mass, no hepatomegaly, no splenomegaly EXT:  2 plus pulses throughout, no edema, no cyanosis no clubbing     EKG:  EKG is = ordered today. Normal sinus rhythm, rate 77, right bundle branch block, no acute ST-T wave changes.  Recent Labs: 05/18/2020: TSH 1.960 08/22/2020: ALT 24 08/27/2020: Magnesium 2.0 08/29/2020: Hemoglobin 13.3; Platelets 125 08/31/2020: BUN 24; Creatinine, Ser 1.35; Potassium 4.1; Sodium 135    Lipid Panel    Component Value Date/Time   CHOL 136 08/22/2020 0542   CHOL 178 05/03/2020 1151   TRIG 73 08/22/2020 0542   HDL 53 08/22/2020 0542   HDL 63 05/03/2020 1151   CHOLHDL 2.6 08/22/2020 0542   VLDL 15 08/22/2020 0542   LDLCALC 68 08/22/2020 0542   LDLCALC 95 05/03/2020 1151      Wt Readings from Last 3 Encounters:  12/27/20 225 lb 6.4 oz (102.2 kg)  10/12/20 223 lb (101.2 kg)  09/21/20 219 lb (99.3 kg)        Cath   Other studies Reviewed: Additional studies/ records that were reviewed today include: Labs   ASSESSMENT AND PLAN:  CAD/CABG:   He is recovering slowly from bypass.  No change in therapy.  I do not think he needs to be on DAPT beyond April and so I will stop Plavix at that point.  HTN: The blood pressure is at target.  No change in therapy.   DYSLIPIDEMIA:    LDL was 68 with an HDL of 53.  No change in therapy.   AS:   He is now status post AVR.  No change in therapy.   SLEEP APNEA : He uses CPAP.  Current medicines are reviewed at length with the patient today.  The patient does not have concerns regarding medicines.  The following changes have been made: As above  Labs/ tests ordered today include: None  Orders Placed This Encounter  Procedures  . EKG 12-Lead     Disposition:   FU with me in 6 months.    Signed, Minus Breeding, MD  12/27/2020 3:11 PM    [facility]

## 2020-12-27 ENCOUNTER — Encounter: Payer: Self-pay | Admitting: Cardiology

## 2020-12-27 ENCOUNTER — Ambulatory Visit: Payer: PPO | Admitting: Cardiology

## 2020-12-27 ENCOUNTER — Other Ambulatory Visit: Payer: Self-pay

## 2020-12-27 VITALS — BP 140/72 | HR 77 | Ht 73.0 in | Wt 225.4 lb

## 2020-12-27 DIAGNOSIS — I350 Nonrheumatic aortic (valve) stenosis: Secondary | ICD-10-CM

## 2020-12-27 DIAGNOSIS — E785 Hyperlipidemia, unspecified: Secondary | ICD-10-CM | POA: Diagnosis not present

## 2020-12-27 DIAGNOSIS — I10 Essential (primary) hypertension: Secondary | ICD-10-CM | POA: Diagnosis not present

## 2020-12-27 DIAGNOSIS — I2510 Atherosclerotic heart disease of native coronary artery without angina pectoris: Secondary | ICD-10-CM | POA: Diagnosis not present

## 2020-12-27 NOTE — Patient Instructions (Signed)
Medication Instructions:  Stop Plavix in April Medication list updated.   Follow-Up: At [facility], you and your health needs are our priority.  As part of our continuing mission to provide you with exceptional heart care, we have created designated Provider Care Teams.  These Care Teams include your primary Cardiologist (physician) and Advanced Practice Providers (APPs -  Physician Assistants and Nurse Practitioners) who all work together to provide you with the care you need, when you need it.  Your next appointment:   6 month(s)  You will receive a reminder letter in the mail two months in advance. If you don't receive a letter, please call our office to schedule the follow-up appointment.  The format for your next appointment:   In Person  Provider:   Minus Breeding, MD

## 2020-12-28 DIAGNOSIS — L02414 Cutaneous abscess of left upper limb: Secondary | ICD-10-CM | POA: Diagnosis not present

## 2020-12-31 ENCOUNTER — Other Ambulatory Visit: Payer: Self-pay | Admitting: Cardiology

## 2021-01-02 ENCOUNTER — Ambulatory Visit: Payer: PPO | Admitting: Cardiology

## 2021-01-03 DIAGNOSIS — Z0000 Encounter for general adult medical examination without abnormal findings: Secondary | ICD-10-CM | POA: Diagnosis not present

## 2021-01-03 DIAGNOSIS — E782 Mixed hyperlipidemia: Secondary | ICD-10-CM | POA: Diagnosis not present

## 2021-01-03 DIAGNOSIS — E119 Type 2 diabetes mellitus without complications: Secondary | ICD-10-CM | POA: Diagnosis not present

## 2021-01-03 DIAGNOSIS — R2681 Unsteadiness on feet: Secondary | ICD-10-CM | POA: Diagnosis not present

## 2021-01-03 DIAGNOSIS — I10 Essential (primary) hypertension: Secondary | ICD-10-CM | POA: Diagnosis not present

## 2021-01-10 DIAGNOSIS — M47816 Spondylosis without myelopathy or radiculopathy, lumbar region: Secondary | ICD-10-CM | POA: Diagnosis not present

## 2021-01-10 DIAGNOSIS — Z79891 Long term (current) use of opiate analgesic: Secondary | ICD-10-CM | POA: Diagnosis not present

## 2021-01-10 DIAGNOSIS — M5136 Other intervertebral disc degeneration, lumbar region: Secondary | ICD-10-CM | POA: Diagnosis not present

## 2021-01-11 DIAGNOSIS — Z951 Presence of aortocoronary bypass graft: Secondary | ICD-10-CM | POA: Diagnosis not present

## 2021-01-11 DIAGNOSIS — Z955 Presence of coronary angioplasty implant and graft: Secondary | ICD-10-CM | POA: Diagnosis not present

## 2021-01-23 DIAGNOSIS — G4733 Obstructive sleep apnea (adult) (pediatric): Secondary | ICD-10-CM | POA: Diagnosis not present

## 2021-02-02 ENCOUNTER — Other Ambulatory Visit: Payer: Self-pay | Admitting: Cardiology

## 2021-02-09 ENCOUNTER — Telehealth: Payer: Self-pay | Admitting: Cardiology

## 2021-02-09 NOTE — Telephone Encounter (Signed)
Returned call to Manuela Schwartz at [facility] states that she was calling to make Dr.Hochrein aware that patient will be having a Radiofrequency Ablation on the Bilateral Lumbar Spine tomorrow at the [facility]. Manuela Schwartz states that this procedure will be done with lidocaine and steroids with option for "twilight sedation". Manuela Schwartz states that patient does not need to hold any medications prior and that procedure and states that patient does not need Cardiac Clearance. Per Manuela Schwartz, the patient wanted to make Dr. Percival Spanish aware of this. Mariana Single that I would forward message to Dr. Percival Spanish. Manuela Schwartz verbalized understanding.

## 2021-02-09 NOTE — Telephone Encounter (Signed)
Anthony Hartman ws calling to let Dr. Percival Spanish know that patient will be have a RFA done with steroid and  lidocaine

## 2021-02-10 DIAGNOSIS — M47816 Spondylosis without myelopathy or radiculopathy, lumbar region: Secondary | ICD-10-CM | POA: Diagnosis not present

## 2021-02-10 DIAGNOSIS — M47896 Other spondylosis, lumbar region: Secondary | ICD-10-CM | POA: Diagnosis not present

## 2021-02-13 DIAGNOSIS — Z951 Presence of aortocoronary bypass graft: Secondary | ICD-10-CM | POA: Diagnosis not present

## 2021-02-13 DIAGNOSIS — Z954 Presence of other heart-valve replacement: Secondary | ICD-10-CM | POA: Diagnosis not present

## 2021-02-24 DIAGNOSIS — M5136 Other intervertebral disc degeneration, lumbar region: Secondary | ICD-10-CM | POA: Diagnosis not present

## 2021-02-24 DIAGNOSIS — M961 Postlaminectomy syndrome, not elsewhere classified: Secondary | ICD-10-CM | POA: Diagnosis not present

## 2021-03-24 IMAGING — DX DG CHEST 2V
3 series · 3 of 3 positions shown · non-contrast
Comparison: 09/08/2020

CLINICAL DATA: 79-year-old male with a history of CABG

EXAM:
CHEST - 2 VIEW

[dg chest 2 view (1 of 3)]
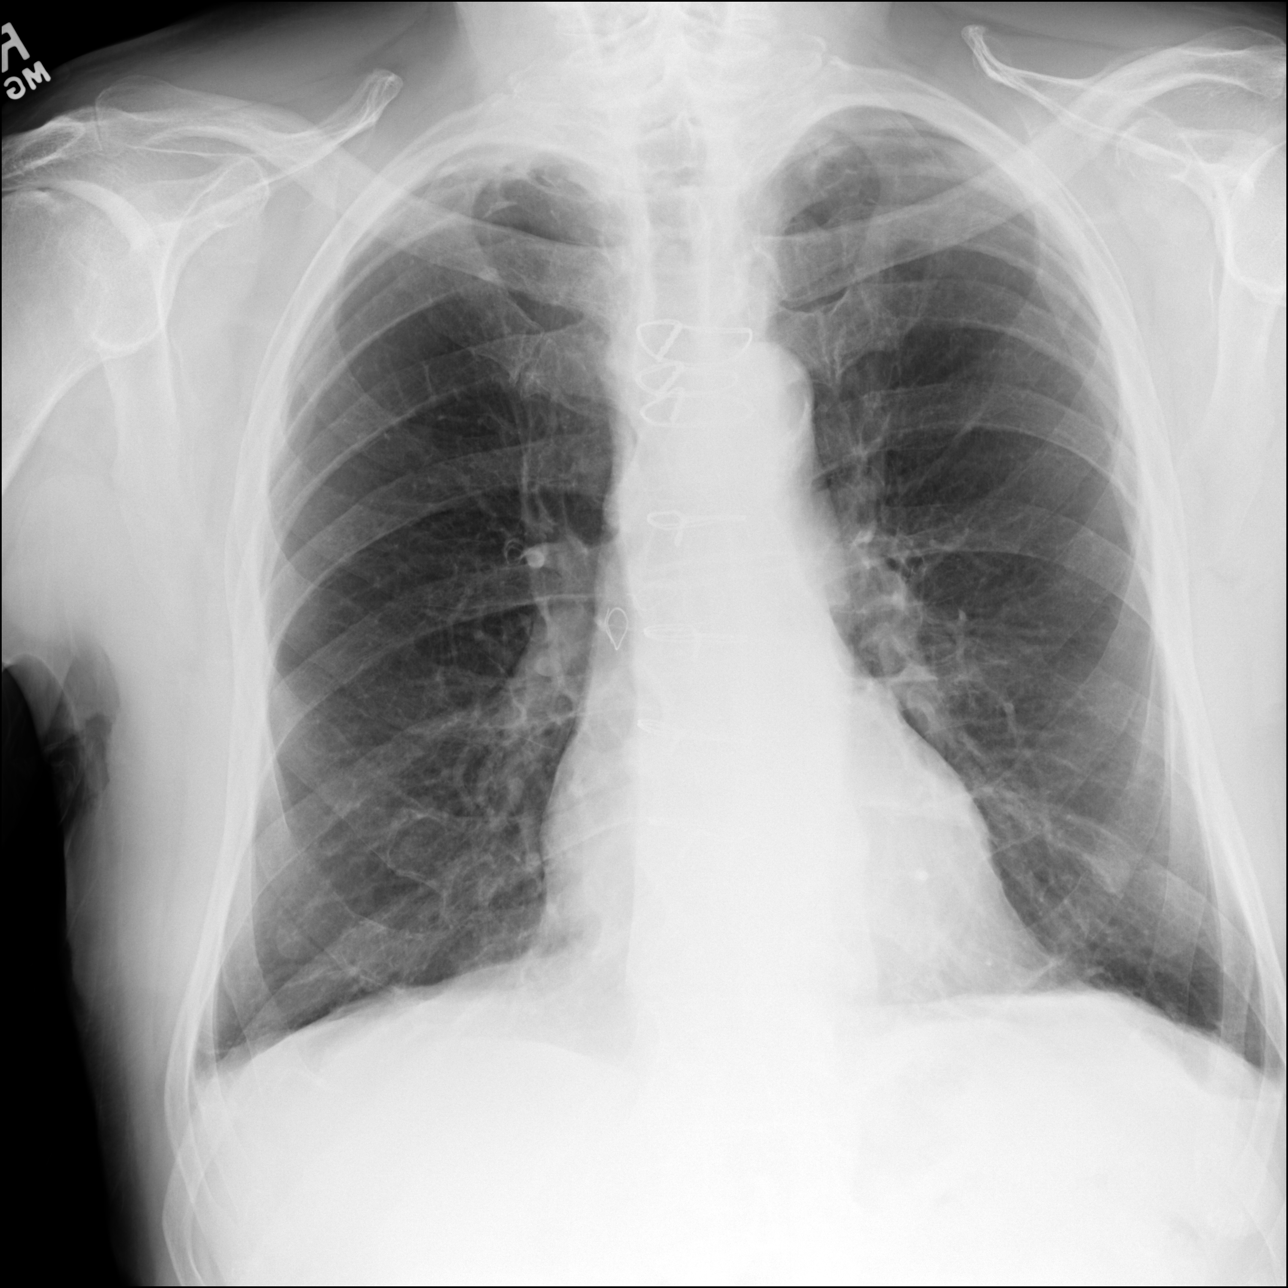

[dg chest 2 view (2 of 3)]
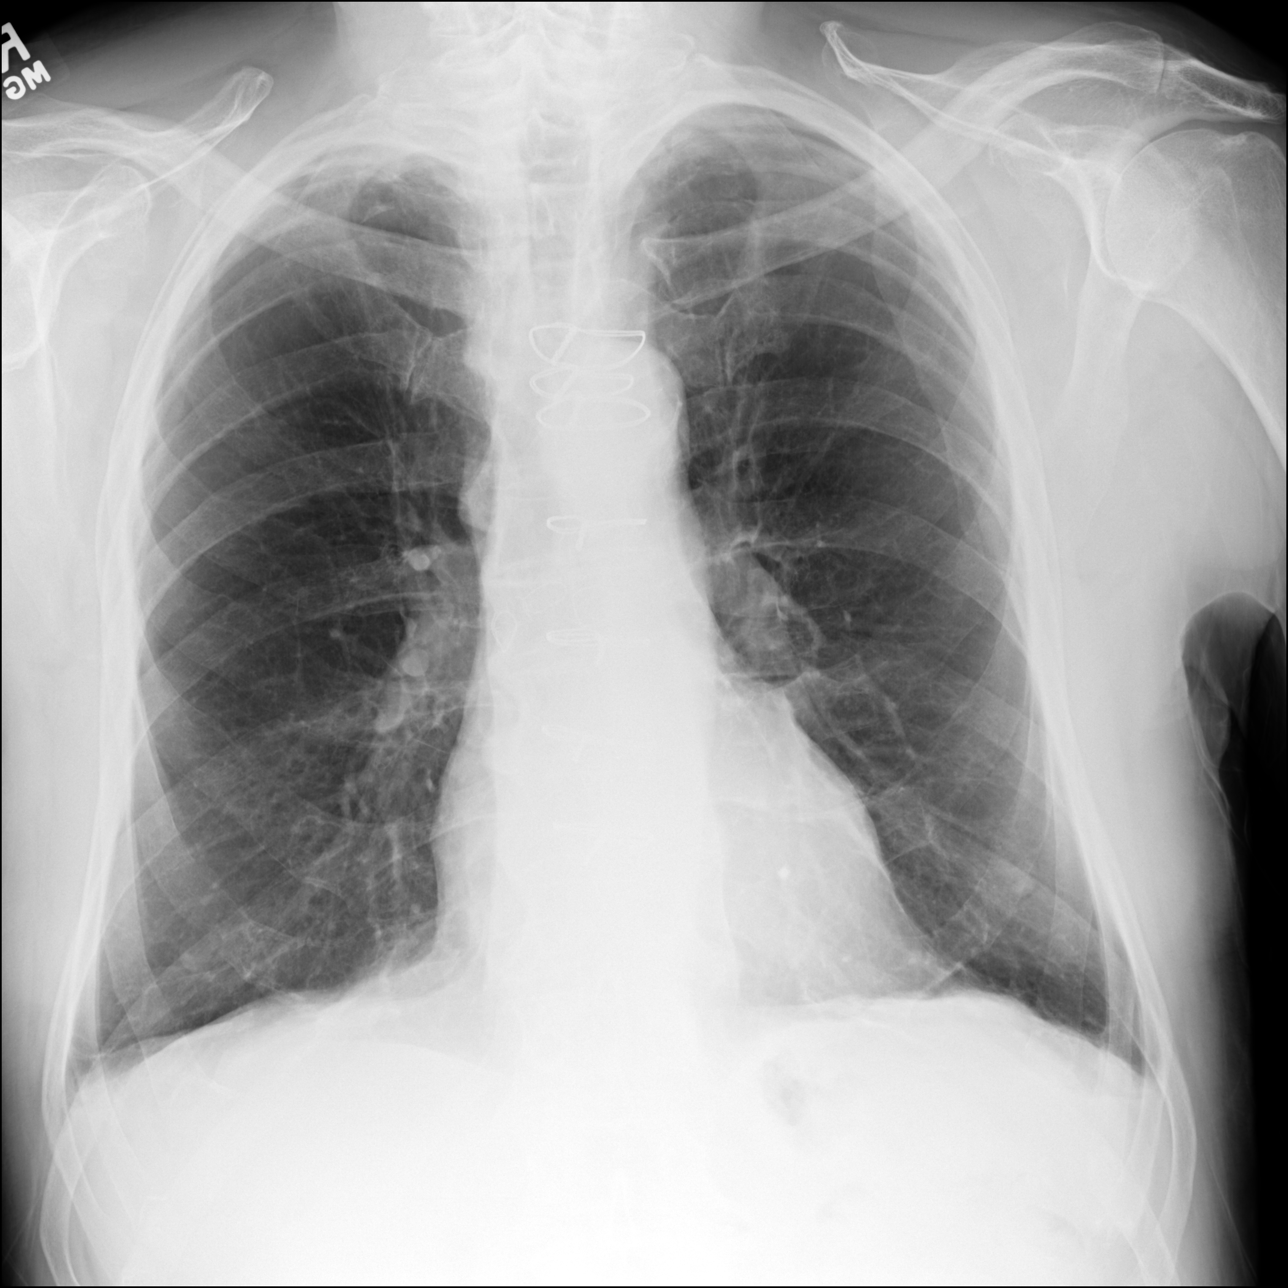

[dg chest 2 view (3 of 3)]
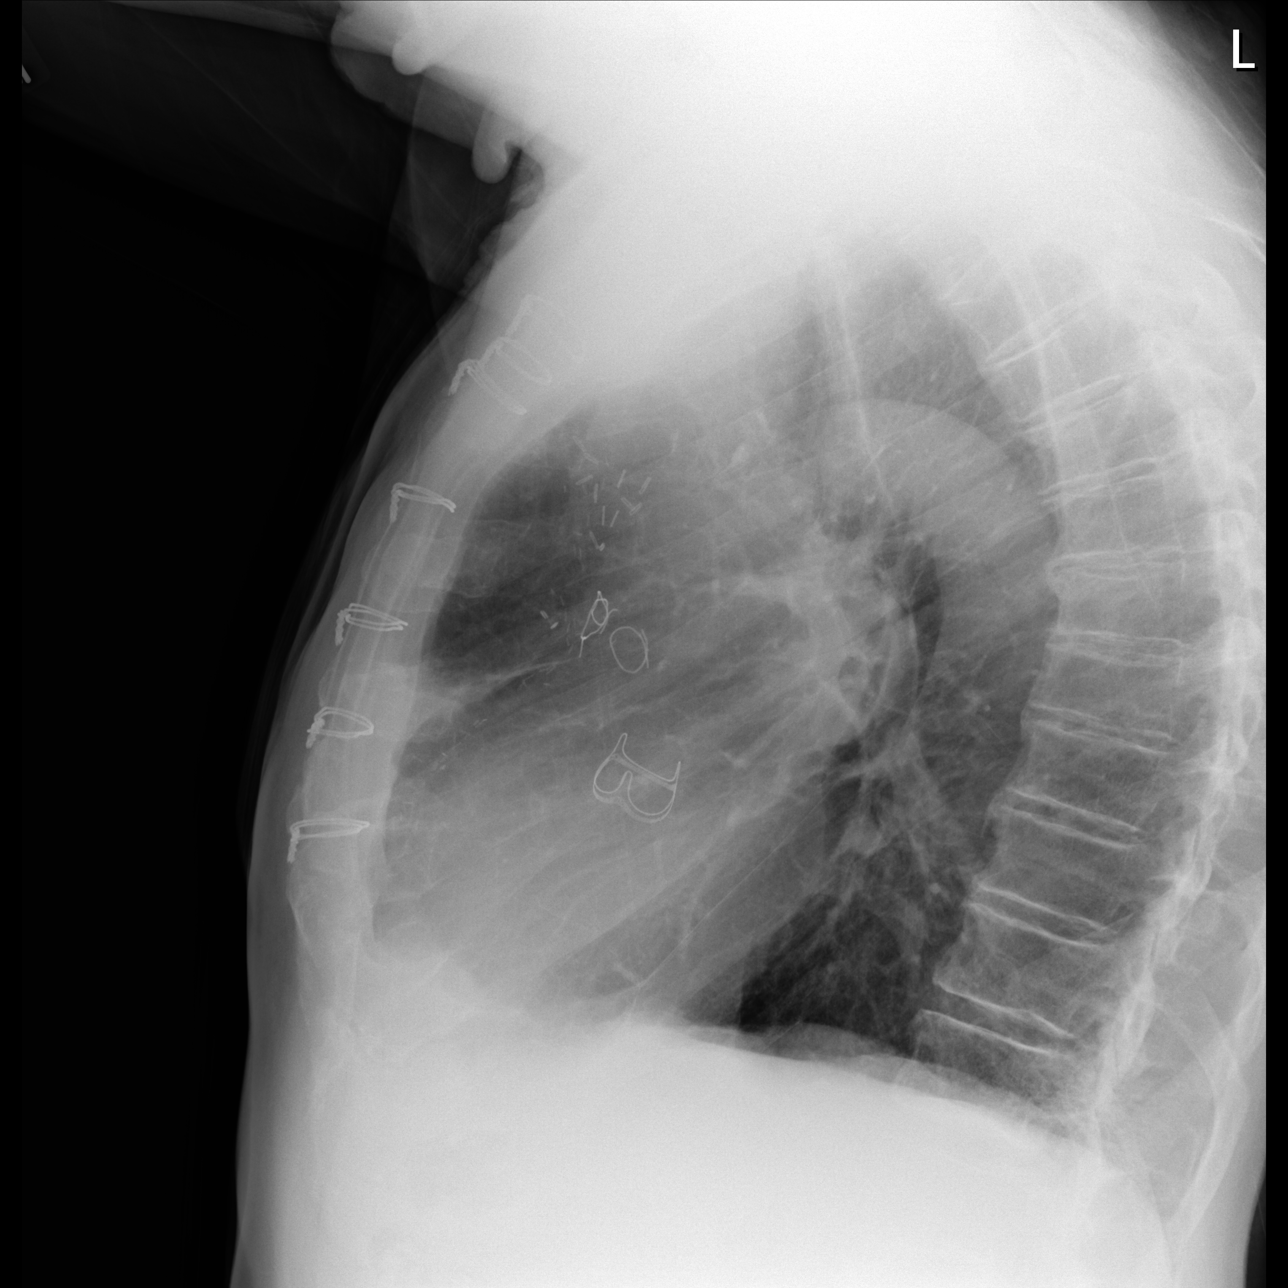

[3 of 3 positions shown; findings below may reference images not displayed]

FINDINGS: Cardiomediastinal silhouette unchanged in size and contour. Surgical
changes of median sternotomy CABG, and aortic valve repair.

Pleuroparenchymal thickening at the apex of the lungs, similar to
the comparison. No pneumothorax. No pleural effusion. No confluent
airspace disease.

Stigmata of emphysema, with increased retrosternal airspace,
flattened hemidiaphragms, increased AP diameter, and hyperinflation
on the AP view. Degenerative changes of the spine.
IMPRESSION: Chronic lung changes and emphysema without evidence of acute
cardiopulmonary disease.

Surgical changes of median sternotomy, CABG, aortic valve repair

## 2021-03-30 DIAGNOSIS — L299 Pruritus, unspecified: Secondary | ICD-10-CM | POA: Diagnosis not present

## 2021-03-30 DIAGNOSIS — L710 Perioral dermatitis: Secondary | ICD-10-CM | POA: Diagnosis not present

## 2021-04-03 DIAGNOSIS — M47896 Other spondylosis, lumbar region: Secondary | ICD-10-CM | POA: Diagnosis not present

## 2021-04-03 DIAGNOSIS — M545 Low back pain, unspecified: Secondary | ICD-10-CM | POA: Diagnosis not present

## 2021-04-03 DIAGNOSIS — M5136 Other intervertebral disc degeneration, lumbar region: Secondary | ICD-10-CM | POA: Diagnosis not present

## 2021-04-03 DIAGNOSIS — Z79891 Long term (current) use of opiate analgesic: Secondary | ICD-10-CM | POA: Diagnosis not present

## 2021-04-03 DIAGNOSIS — M961 Postlaminectomy syndrome, not elsewhere classified: Secondary | ICD-10-CM | POA: Diagnosis not present

## 2021-04-25 DIAGNOSIS — G4733 Obstructive sleep apnea (adult) (pediatric): Secondary | ICD-10-CM | POA: Diagnosis not present

## 2021-05-09 DIAGNOSIS — C61 Malignant neoplasm of prostate: Secondary | ICD-10-CM | POA: Diagnosis not present

## 2021-05-22 DIAGNOSIS — N35011 Post-traumatic bulbous urethral stricture: Secondary | ICD-10-CM | POA: Diagnosis not present

## 2021-05-22 DIAGNOSIS — Z8551 Personal history of malignant neoplasm of bladder: Secondary | ICD-10-CM | POA: Diagnosis not present

## 2021-05-23 DIAGNOSIS — M542 Cervicalgia: Secondary | ICD-10-CM | POA: Diagnosis not present

## 2021-05-23 DIAGNOSIS — R630 Anorexia: Secondary | ICD-10-CM | POA: Diagnosis not present

## 2021-05-25 DIAGNOSIS — M542 Cervicalgia: Secondary | ICD-10-CM | POA: Diagnosis not present

## 2021-06-05 DIAGNOSIS — R269 Unspecified abnormalities of gait and mobility: Secondary | ICD-10-CM | POA: Diagnosis not present

## 2021-06-05 DIAGNOSIS — R29898 Other symptoms and signs involving the musculoskeletal system: Secondary | ICD-10-CM | POA: Diagnosis not present

## 2021-06-05 DIAGNOSIS — M47812 Spondylosis without myelopathy or radiculopathy, cervical region: Secondary | ICD-10-CM | POA: Diagnosis not present

## 2021-06-05 DIAGNOSIS — M21372 Foot drop, left foot: Secondary | ICD-10-CM | POA: Diagnosis not present

## 2021-06-07 ENCOUNTER — Other Ambulatory Visit: Payer: Self-pay | Admitting: Cardiology

## 2021-06-14 DIAGNOSIS — R197 Diarrhea, unspecified: Secondary | ICD-10-CM | POA: Diagnosis not present

## 2021-06-14 DIAGNOSIS — R634 Abnormal weight loss: Secondary | ICD-10-CM | POA: Diagnosis not present

## 2021-06-14 DIAGNOSIS — R6881 Early satiety: Secondary | ICD-10-CM | POA: Diagnosis not present

## 2021-06-14 DIAGNOSIS — R110 Nausea: Secondary | ICD-10-CM | POA: Diagnosis not present

## 2021-06-14 DIAGNOSIS — K219 Gastro-esophageal reflux disease without esophagitis: Secondary | ICD-10-CM | POA: Diagnosis not present

## 2021-06-16 DIAGNOSIS — R197 Diarrhea, unspecified: Secondary | ICD-10-CM | POA: Diagnosis not present

## 2021-06-16 DIAGNOSIS — R634 Abnormal weight loss: Secondary | ICD-10-CM | POA: Diagnosis not present

## 2021-06-16 DIAGNOSIS — R110 Nausea: Secondary | ICD-10-CM | POA: Diagnosis not present

## 2021-06-19 DIAGNOSIS — M5135 Other intervertebral disc degeneration, thoracolumbar region: Secondary | ICD-10-CM | POA: Diagnosis not present

## 2021-06-19 DIAGNOSIS — M5137 Other intervertebral disc degeneration, lumbosacral region: Secondary | ICD-10-CM | POA: Diagnosis not present

## 2021-06-19 DIAGNOSIS — M5021 Other cervical disc displacement,  high cervical region: Secondary | ICD-10-CM | POA: Diagnosis not present

## 2021-06-19 DIAGNOSIS — M47817 Spondylosis without myelopathy or radiculopathy, lumbosacral region: Secondary | ICD-10-CM | POA: Diagnosis not present

## 2021-06-19 DIAGNOSIS — M4312 Spondylolisthesis, cervical region: Secondary | ICD-10-CM | POA: Diagnosis not present

## 2021-06-19 DIAGNOSIS — M47815 Spondylosis without myelopathy or radiculopathy, thoracolumbar region: Secondary | ICD-10-CM | POA: Diagnosis not present

## 2021-06-19 DIAGNOSIS — M4807 Spinal stenosis, lumbosacral region: Secondary | ICD-10-CM | POA: Diagnosis not present

## 2021-06-19 DIAGNOSIS — M5033 Other cervical disc degeneration, cervicothoracic region: Secondary | ICD-10-CM | POA: Diagnosis not present

## 2021-06-19 DIAGNOSIS — M438X6 Other specified deforming dorsopathies, lumbar region: Secondary | ICD-10-CM | POA: Diagnosis not present

## 2021-06-19 DIAGNOSIS — M48061 Spinal stenosis, lumbar region without neurogenic claudication: Secondary | ICD-10-CM | POA: Diagnosis not present

## 2021-06-19 DIAGNOSIS — M5031 Other cervical disc degeneration,  high cervical region: Secondary | ICD-10-CM | POA: Diagnosis not present

## 2021-06-19 DIAGNOSIS — Z9889 Other specified postprocedural states: Secondary | ICD-10-CM | POA: Diagnosis not present

## 2021-06-19 DIAGNOSIS — M47812 Spondylosis without myelopathy or radiculopathy, cervical region: Secondary | ICD-10-CM | POA: Diagnosis not present

## 2021-06-19 DIAGNOSIS — M5136 Other intervertebral disc degeneration, lumbar region: Secondary | ICD-10-CM | POA: Diagnosis not present

## 2021-06-19 DIAGNOSIS — M4802 Spinal stenosis, cervical region: Secondary | ICD-10-CM | POA: Diagnosis not present

## 2021-06-19 DIAGNOSIS — M47816 Spondylosis without myelopathy or radiculopathy, lumbar region: Secondary | ICD-10-CM | POA: Diagnosis not present

## 2021-06-23 DIAGNOSIS — M47896 Other spondylosis, lumbar region: Secondary | ICD-10-CM | POA: Diagnosis not present

## 2021-06-23 DIAGNOSIS — Z79891 Long term (current) use of opiate analgesic: Secondary | ICD-10-CM | POA: Diagnosis not present

## 2021-06-23 DIAGNOSIS — M961 Postlaminectomy syndrome, not elsewhere classified: Secondary | ICD-10-CM | POA: Diagnosis not present

## 2021-06-23 DIAGNOSIS — M5136 Other intervertebral disc degeneration, lumbar region: Secondary | ICD-10-CM | POA: Diagnosis not present

## 2021-06-25 DIAGNOSIS — Z952 Presence of prosthetic heart valve: Secondary | ICD-10-CM | POA: Insufficient documentation

## 2021-06-25 NOTE — Progress Notes (Signed)
Cardiology Office Note   Date:  06/26/2021   ID:  Anthony Hartman, DOB 1941/06/07, MRN [id number]  PCP:  Myrlene Broker, MD  Cardiologist:   Minus Breeding, MD    Chief Complaint  Patient presents with   Weakness       History of Present Illness: Anthony Hartman is a 80 y.o. male who presents for evaluation of CAD.  In 2012 he had an occluded OM.  He managed medically .  He did have an inferior MI in November 2017 and had a bare-metal stent to his right coronary artery.  This was all in [facility].   He had a cath in 2018 and he had an occluded ramus that they could not cross.  He was managed medically.  He presented in June with unstable angina and he had PCI of a diagonal.  He called this summer with chest pain.  He had a submax stress test without ischemia.  He subsequently presented with unstable angina and was found to have progressive coronary disease.  He had an echo and the AS was severe. He status post AVR/CABG.   Since I last saw him he has gone downhill.  He is lost about 20 pounds in a month.  He is having constant diarrhea.  He does not have any appetite.  He is having a GI work-up and is getting a CT angiogram of his abdomen tomorrow.  He is also scheduled to have EGD and colonoscopy.  I was able to look through records at [facility].  He has had elevated CRP and ESR.  However his stool has otherwise been benign.  TSH is been okay.  There is no occult blood.  He has an elevated ferritin.  His liver enzymes however have been okay.   He denies any cardiovascular symptoms such as chest discomfort, neck or arm discomfort.  He has had no new shortness of breath, PND or orthopnea.  He has had no palpitations, presyncope or syncope.  He has had significant muscle wasting and his balance issues are getting worse.  He is having to walk with a cane.  He is having some back pain as well.   Past Medical History:  Diagnosis Date   Anxiety    Arthritis    Bladder cancer  ([place])    Bladder stone    BPH (benign prostatic hypertrophy)    Depression    GERD (gastroesophageal reflux disease)    History of kidney stones    History of myocardial infarction    07-17-2011--   MI W/ OCCLUDED OM1. 2017 occluded RCA treated with a bare-metal stent, high-grade ramus intermediate stenosis in 2018 with failed attempted PCI   Hyperlipidemia    Hypertension    Myocardial infarction [facility]) 2012; 2017   2012 - noted Subtotal CTO of OM/RI;  09/2016 - Acute Infer STEMI - BMS x 2 mRCA (3.5 mm x 15 & 3.5 x 12 - tapered 4.0 - 3.8 mm)   OSA (obstructive sleep apnea)    cpap non-compliant    Prostate cancer [facility])     Past Surgical History:  Procedure Laterality Date   AORTIC VALVE REPLACEMENT N/A 08/26/2020   Procedure: AORTIC VALVE REPLACEMENT (AVR) USING INSPIRIS VALVE SIZE 25MM;  Surgeon: Gaye Pollack, MD;  Location: [place];  Service: Open Heart Surgery;  Laterality: N/A;   ARTHRODESIS METATARSALPHALANGEAL JOINT (MTPJ) Right 02/20/2018   Procedure: Right Hallux Metatarsal Phalangeal Joint Arthrodesis; Right Silver Bunionectomy;  Surgeon: Wylene Simmer, MD;  Location: [place];  Service: Orthopedics;  Laterality: Right;   BACK SURGERY     CARDIOVASCULAR STRESS TEST  04/22/11   CORONARY ANGIOPLASTY  03/2017    failed PCI RI (OM1) 2018 -- similar to 2012.   CORONARY ARTERY BYPASS GRAFT N/A 08/26/2020   Procedure: CORONARY ARTERY BYPASS GRAFTING (CABG) TIMES FIVE, USING LEFT INTERNAL MAMMARY ARTERY AND RIGHT GREATER SAPHENOUS VEIN HARVESTED ENDOSCOPICALLY;  Surgeon: Gaye Pollack, MD;  Location: [place];  Service: Open Heart Surgery;  Laterality: N/A;   CORONARY STENT INTERVENTION  09/2016   [facility] (Acute Inferior STEMI)- Acute mid RCA 100% --> Integrity BMS 3.5 mm x 44m (3.8 mm) & 3.5 mm x 12 mm (4.1 mm) --BMS used because of need for back surgery   CORONARY STENT INTERVENTION N/A 05/01/2019   Procedure: CORONARY STENT INTERVENTION;  Surgeon:  VJettie Booze MD;  [facility];  Service: Cardiovascular;  Laterality: N/A;   CYSTO/ BLADDER BX'S  X2  2001  / X1  2003   CYSTO/ BLADDER BX'S/ BILATERAL RETROGRADE URETEROPYLEGRAM  2003; 2007; 2008;  12-30-2007   CARCINOMA IN SITU OF BLADDER   CYSTOSCOPY WITH LITHOLAPAXY N/A 05/07/2013   Procedure: CYSTOSCOPY WITH LITHOLAPAXY;  Surgeon: SFranchot Gallo MD;  [facility];  Service: Urology;  Laterality: N/A;   EHL Lengthening Right 05/10/2017   RT FOOT   Hammer Toe Repair Right 05/10/2017   Rt #2 Toe   HAMMERTOE RECONSTRUCTION WITH WEIL OSTEOTOMY Right 02/20/2018   Procedure: Right Second Metatarsal WPriscille Heidelbergand Revision Hammertoe Correction;  Surgeon: HWylene Simmer MD;  [place]  Service: Orthopedics;  Laterality: Right;   INTRAVASCULAR PRESSURE WIRE/FFR STUDY N/A 08/23/2020   Procedure: INTRAVASCULAR PRESSURE WIRE/FFR STUDY;  Surgeon: SBelva Crome MD;  [facility];  Service: Cardiovascular;  Laterality: N/A;   LEFT HEART CATH AND CORONARY ANGIOGRAPHY  07/17/2011   OCCLUDED OM1 WITH UNSUCCESSFUL PCI ATTEMPT, O/W MILD TO MODERATE DISEASE, DR ANDY CHIU ([facility])     LEFT HEART CATH AND CORONARY ANGIOGRAPHY  09/2016   Acute Inferior STEMI ([facility])  - Mid LAD 30%, D2 50%.  OM 1/RI 100%, mid circumflex 30%.  Acute mRCA 100% (thrombotic) - BMS PCI.  EF 50% with inferobasal hypokinesis.   LEFT HEART CATH AND CORONARY ANGIOGRAPHY  03/2017   (Dr. RChuck Hint- [facility]): OM(RI) ~ CTO (unfixable PCI unable to cross with wire/balloon. pLAD 40%, ost D1 40%;  prox-mid RCA 15%.  Mild stenosis with small mid LCx.  EF 60%.   LEFT HEART CATH AND CORONARY ANGIOGRAPHY N/A 05/01/2019   Procedure: LEFT HEART CATH AND CORONARY ANGIOGRAPHY;  Surgeon: VJettie Booze MD;  [facility];  Service: Cardiovascular;  Laterality: N/A;   LUMBAR DECOMPRESSION FORAMINOTOMY L3 -- L5/  REMOVAL CYST L4-5  FACET JOINT  08-21-2009   ALSO HAD PRIOR BACK SURG IN 1992   LUMBAR LAMINECTOMY  04/04/2017   L2-3   LUMBAR LAMINECTOMY/DECOMPRESSION MICRODISCECTOMY N/A 04/04/2017   Procedure: Laminectomy and Foraminotomy - Lumbar Two-Three;  Surgeon: JEustace Moore MD;  [place]  Service: Neurosurgery;  Laterality: N/A;   OPEN TENOTOMY OF FLEXOR 2ND AND 3RD RIGHT TOES  APRIL 2008   PROSTATE BIOPSY     RIGHT/LEFT HEART CATH AND CORONARY ANGIOGRAPHY N/A 08/23/2020   Procedure: RIGHT/LEFT HEART CATH AND CORONARY ANGIOGRAPHY;  Surgeon: SBelva Crome MD;  [place]: [place] [facility];  Service: Cardiovascular;  Laterality: N/A;   TEE WITHOUT CARDIOVERSION N/A 08/26/2020   Procedure: TRANSESOPHAGEAL ECHOCARDIOGRAM (TEE);  Surgeon: Gaye Pollack, MD;  Location: [place];  Service: Open Heart Surgery;  Laterality: N/A;   TRANSTHORACIC ECHOCARDIOGRAM  07/17/2011   at [facility], MILD TO MODERATE CONCENTRIC LVH/ NORMAL LVSF/ EF 55-60%/ MILD AORTIC STENOSIS   TRANSTHORACIC ECHOCARDIOGRAM  06/2018   ([place]): Normal LV function/thickness.  EF 50 to 55%.  Normal wall motion.  Mild to moderate MAC.  Moderate to Severe Aortic Stenosis (calcified).  Mild aortic insufficiency.   TRANSURETHRAL RESECTION OF BLADDER TUMOR  01-10-2004   TRANSURETHRAL RESECTION OF PROSTATE N/A 05/07/2013   Procedure: TRANSURETHRAL RESECTION OF THE PROSTATE WITH GYRUS INSTRUMENTS;  Surgeon: Franchot Gallo, MD;  [facility];  Service: Urology;  Laterality: N/A;     Current Outpatient Medications  Medication Sig Dispense Refill   acetaminophen (TYLENOL) 500 MG tablet Take 500 mg by mouth every 6 (six) hours as needed (pain.).     ALPRAZolam (XANAX) 1 MG tablet Take 0.5 mg by mouth at bedtime.     aspirin EC 81 MG tablet Take 81 mg by mouth daily. Swallow whole.     budesonide (ENTOCORT EC) 3 MG 24 hr capsule Take 9 mg by mouth daily.     buPROPion (WELLBUTRIN XL) 300 MG 24 hr tablet Take 300 mg by mouth daily.      carvedilol (COREG) 6.25 MG tablet TAKE 1 TABLET BY MOUTH TWICE (2) DAILY 180 tablet 3   Evolocumab (REPATHA SURECLICK) 056 MG/ML SOAJ Inject 140 mg into the skin every 14 (fourteen) days. 6 mL 3   meloxicam (MOBIC) 7.5 MG tablet Take 1 tablet (7.5 mg total) by mouth daily as needed for pain. 30 tablet 3   Multiple Vitamin (MULTIVITAMIN WITH MINERALS) TABS tablet Take 1 tablet by mouth daily.     omeprazole (PRILOSEC) 40 MG capsule Take 40 mg by mouth daily.     ondansetron (ZOFRAN) 4 MG tablet Take 1 tablet (4 mg total) by mouth every 8 (eight) hours as needed for nausea. 20 tablet 1   oxyCODONE (OXY IR/ROXICODONE) 5 MG immediate release tablet Take 1 tablet (5 mg total) by mouth every 6 (six) hours as needed for severe pain. 30 tablet 0   Polyethyl Glycol-Propyl Glycol (LUBRICANT EYE DROPS) 0.4-0.3 % SOLN Place 1 drop into both eyes 3 (three) times daily as needed (dry/irritated eyes).     testosterone cypionate (DEPOTESTOSTERONE CYPIONATE) 200 MG/ML injection Inject 120 mg into the muscle every 14 (fourteen) days.      valACYclovir (VALTREX) 1000 MG tablet Take 1,000 mg by mouth 3 (three) times daily as needed (fever blisters).      No current facility-administered medications for this visit.    Allergies:   Statins, Augmentin [amoxicillin-pot clavulanate], Dexamethasone, and Sulfa antibiotics   ROS:  Please see the history of present illness.   Otherwise, review of systems are positive for none.   All other systems are reviewed and negative.    PHYSICAL EXAM: VS:  BP (!) 100/50   Pulse 70   Ht _0  (1.93 m)   Wt 204 lb 14.4 oz (92.9 kg)   BMI 24.94 kg/m  , BMI Body mass index is 24.94 kg/m. GENERAL:  Well appearing NECK:  No jugular venous distention, waveform within normal limits, carotid upstroke brisk and symmetric, no bruits, no thyromegaly LUNGS:  Clear to auscultation bilaterally CHEST:  Well healed sternotomy scar. HEART:  PMI not displaced or sustained,S1 and S2 within  normal limits, no S3, no S4, no clicks, no rubs, very soft apical systolic murmur radiating at the aortic outflow tract, no diastolic murmurs ABD:  Flat, positive bowel sounds normal in frequency in pitch, no bruits, no rebound, no guarding, no midline pulsatile mass, no hepatomegaly, no splenomegaly EXT:  2 plus pulses throughout, no edema, no cyanosis no clubbing  EKG:  EKG is not ordered today.    Recent Labs: 08/22/2020: ALT 24 08/27/2020: Magnesium 2.0 08/29/2020: Hemoglobin 13.3; Platelets 125 08/31/2020: BUN 24; Creatinine, Ser 1.35; Potassium 4.1; Sodium 135    Lipid Panel    Component Value Date/Time   CHOL 136 08/22/2020 0542   CHOL 178 05/03/2020 1151   TRIG 73 08/22/2020 0542   HDL 53 08/22/2020 0542   HDL 63 05/03/2020 1151   CHOLHDL 2.6 08/22/2020 0542   VLDL 15 08/22/2020 0542   LDLCALC 68 08/22/2020 0542   LDLCALC 95 05/03/2020 1151      Wt Readings from Last 3 Encounters:  06/26/21 204 lb 14.4 oz (92.9 kg)  12/27/20 225 lb 6.4 oz (102.2 kg)  10/12/20 223 lb (101.2 kg)        Cath     Other studies Reviewed: Additional studies/ records that were reviewed today include: [facility] records   ASSESSMENT AND PLAN:  CAD/CABG:    The patient has no new sypmtoms.  No further cardiovascular testing is indicated.  We will continue with aggressive risk reduction and meds as listed.  HTN: The blood pressure is running low but he is really only on a slight dose of beta-blocker and I like to continue this.   DYSLIPIDEMIA:    LDL was 68 in October with an HDL of 53.  He will continue on the meds as listed.   AS:   He is now status post AVR.  He has not had any fevers or chills or suggestion of endocarditis.  Certainly if he has any continued failure to thrive and there is no clear etiology I have a low threshold to further evaluate this.   SLEEP APNEA :   I am going to set him up as a new patient to see Dr. Claiborne Billings.  He wants to change his care and his CPAP is  probably not fitting any longer as he is lost so much weight.  He is having significant daytime somnolence and fatigue.   WEIGHT LOSS: He is having a GI evaluation as above.  He has had diarrhea.  I do not think this is related to his medications but I think is a reasonable to stop the bempedoic acid just to see if there is any improvement.  Current medicines are reviewed at length with the patient today.  The patient does not have concerns regarding medicines.  The following changes have been made: As above  Labs/ tests ordered today include: None  Orders Placed This Encounter  Procedures   Ambulatory referral to Cardiology      Disposition:   FU with me in 4 months.    Signed, Minus Breeding, MD  06/26/2021 12:18 PM    [facility]

## 2021-06-26 ENCOUNTER — Encounter: Payer: Self-pay | Admitting: Cardiology

## 2021-06-26 ENCOUNTER — Ambulatory Visit: Payer: PPO | Admitting: Cardiology

## 2021-06-26 ENCOUNTER — Other Ambulatory Visit: Payer: Self-pay

## 2021-06-26 VITALS — BP 100/50 | HR 70 | Ht 76.0 in | Wt 204.9 lb

## 2021-06-26 DIAGNOSIS — I2510 Atherosclerotic heart disease of native coronary artery without angina pectoris: Secondary | ICD-10-CM | POA: Diagnosis not present

## 2021-06-26 DIAGNOSIS — G4730 Sleep apnea, unspecified: Secondary | ICD-10-CM | POA: Diagnosis not present

## 2021-06-26 DIAGNOSIS — E785 Hyperlipidemia, unspecified: Secondary | ICD-10-CM

## 2021-06-26 DIAGNOSIS — Z952 Presence of prosthetic heart valve: Secondary | ICD-10-CM | POA: Diagnosis not present

## 2021-06-26 DIAGNOSIS — I10 Essential (primary) hypertension: Secondary | ICD-10-CM | POA: Diagnosis not present

## 2021-06-26 NOTE — Patient Instructions (Addendum)
Medication Instructions:   STOP NEXLITROL   *If you need a refill on your cardiac medications before your next appointment, please call your pharmacy*   Follow-Up: At [facility], you and your health needs are our priority.  As part of our continuing mission to provide you with exceptional heart care, we have created designated Provider Care Teams.  These Care Teams include your primary Cardiologist (physician) and Advanced Practice Providers (APPs -  Physician Assistants and Nurse Practitioners) who all work together to provide you with the care you need, when you need it.  We recommend signing up for the patient portal called "MyChart".  Sign up information is provided on this After Visit Summary.  MyChart is used to connect with patients for Virtual Visits (Telemedicine).  Patients are able to view lab/test results, encounter notes, upcoming appointments, etc.  Non-urgent messages can be sent to your provider as well.   To learn more about what you can do with MyChart, go to [email or web address].    Your next appointment:   4 month(s)  The format for your next appointment:   In Person  Provider:   Minus Breeding, MD  [facility] [place]

## 2021-06-27 DIAGNOSIS — I719 Aortic aneurysm of unspecified site, without rupture: Secondary | ICD-10-CM | POA: Diagnosis not present

## 2021-06-27 DIAGNOSIS — K8020 Calculus of gallbladder without cholecystitis without obstruction: Secondary | ICD-10-CM | POA: Diagnosis not present

## 2021-06-27 DIAGNOSIS — J90 Pleural effusion, not elsewhere classified: Secondary | ICD-10-CM | POA: Diagnosis not present

## 2021-06-27 DIAGNOSIS — R634 Abnormal weight loss: Secondary | ICD-10-CM | POA: Diagnosis not present

## 2021-06-27 DIAGNOSIS — I2510 Atherosclerotic heart disease of native coronary artery without angina pectoris: Secondary | ICD-10-CM | POA: Diagnosis not present

## 2021-06-27 DIAGNOSIS — J9811 Atelectasis: Secondary | ICD-10-CM | POA: Diagnosis not present

## 2021-06-27 DIAGNOSIS — N281 Cyst of kidney, acquired: Secondary | ICD-10-CM | POA: Diagnosis not present

## 2021-06-27 DIAGNOSIS — I714 Abdominal aortic aneurysm, without rupture: Secondary | ICD-10-CM | POA: Diagnosis not present

## 2021-06-27 DIAGNOSIS — R197 Diarrhea, unspecified: Secondary | ICD-10-CM | POA: Diagnosis not present

## 2021-06-27 DIAGNOSIS — K5730 Diverticulosis of large intestine without perforation or abscess without bleeding: Secondary | ICD-10-CM | POA: Diagnosis not present

## 2021-07-10 DIAGNOSIS — M48061 Spinal stenosis, lumbar region without neurogenic claudication: Secondary | ICD-10-CM | POA: Diagnosis not present

## 2021-07-10 DIAGNOSIS — M5030 Other cervical disc degeneration, unspecified cervical region: Secondary | ICD-10-CM | POA: Diagnosis not present

## 2021-07-10 DIAGNOSIS — R269 Unspecified abnormalities of gait and mobility: Secondary | ICD-10-CM | POA: Diagnosis not present

## 2021-07-10 DIAGNOSIS — M47812 Spondylosis without myelopathy or radiculopathy, cervical region: Secondary | ICD-10-CM | POA: Diagnosis not present

## 2021-07-10 DIAGNOSIS — M5136 Other intervertebral disc degeneration, lumbar region: Secondary | ICD-10-CM | POA: Diagnosis not present

## 2021-07-14 DIAGNOSIS — J90 Pleural effusion, not elsewhere classified: Secondary | ICD-10-CM | POA: Diagnosis not present

## 2021-07-14 DIAGNOSIS — Z951 Presence of aortocoronary bypass graft: Secondary | ICD-10-CM | POA: Diagnosis not present

## 2021-07-14 DIAGNOSIS — J189 Pneumonia, unspecified organism: Secondary | ICD-10-CM | POA: Diagnosis not present

## 2021-07-14 DIAGNOSIS — I700 Atherosclerosis of aorta: Secondary | ICD-10-CM | POA: Diagnosis not present

## 2021-07-14 DIAGNOSIS — R0789 Other chest pain: Secondary | ICD-10-CM | POA: Diagnosis not present

## 2021-07-20 ENCOUNTER — Emergency Department (HOSPITAL_COMMUNITY): Payer: PPO

## 2021-07-20 ENCOUNTER — Other Ambulatory Visit: Payer: Self-pay

## 2021-07-20 ENCOUNTER — Emergency Department (HOSPITAL_COMMUNITY)
Admission: EM | Admit: 2021-07-20 | Discharge: 2021-07-20 | Disposition: A | Discharge status: Home / Self Care | Payer: PPO | Attending: Emergency Medicine | Admitting: Emergency Medicine

## 2021-07-20 ENCOUNTER — Encounter (HOSPITAL_COMMUNITY): Payer: Self-pay | Admitting: Radiology

## 2021-07-20 DIAGNOSIS — C3491 Malignant neoplasm of unspecified part of right bronchus or lung: Secondary | ICD-10-CM | POA: Insufficient documentation

## 2021-07-20 DIAGNOSIS — Z79899 Other long term (current) drug therapy: Secondary | ICD-10-CM | POA: Diagnosis not present

## 2021-07-20 DIAGNOSIS — J90 Pleural effusion, not elsewhere classified: Secondary | ICD-10-CM | POA: Diagnosis not present

## 2021-07-20 DIAGNOSIS — M79621 Pain in right upper arm: Secondary | ICD-10-CM | POA: Insufficient documentation

## 2021-07-20 DIAGNOSIS — Z7982 Long term (current) use of aspirin: Secondary | ICD-10-CM | POA: Diagnosis not present

## 2021-07-20 DIAGNOSIS — J432 Centrilobular emphysema: Secondary | ICD-10-CM | POA: Diagnosis not present

## 2021-07-20 DIAGNOSIS — Z8546 Personal history of malignant neoplasm of prostate: Secondary | ICD-10-CM | POA: Diagnosis not present

## 2021-07-20 DIAGNOSIS — I10 Essential (primary) hypertension: Secondary | ICD-10-CM | POA: Insufficient documentation

## 2021-07-20 DIAGNOSIS — R079 Chest pain, unspecified: Secondary | ICD-10-CM | POA: Diagnosis not present

## 2021-07-20 DIAGNOSIS — R634 Abnormal weight loss: Secondary | ICD-10-CM | POA: Diagnosis not present

## 2021-07-20 DIAGNOSIS — I25110 Atherosclerotic heart disease of native coronary artery with unstable angina pectoris: Secondary | ICD-10-CM | POA: Diagnosis not present

## 2021-07-20 DIAGNOSIS — R0789 Other chest pain: Secondary | ICD-10-CM | POA: Diagnosis not present

## 2021-07-20 DIAGNOSIS — Z951 Presence of aortocoronary bypass graft: Secondary | ICD-10-CM | POA: Diagnosis not present

## 2021-07-20 DIAGNOSIS — F17200 Nicotine dependence, unspecified, uncomplicated: Secondary | ICD-10-CM | POA: Diagnosis not present

## 2021-07-20 DIAGNOSIS — Z8551 Personal history of malignant neoplasm of bladder: Secondary | ICD-10-CM | POA: Diagnosis not present

## 2021-07-20 DIAGNOSIS — M47816 Spondylosis without myelopathy or radiculopathy, lumbar region: Secondary | ICD-10-CM | POA: Diagnosis not present

## 2021-07-20 DIAGNOSIS — N281 Cyst of kidney, acquired: Secondary | ICD-10-CM | POA: Diagnosis not present

## 2021-07-20 DIAGNOSIS — D492 Neoplasm of unspecified behavior of bone, soft tissue, and skin: Secondary | ICD-10-CM | POA: Diagnosis not present

## 2021-07-20 LAB — CBC WITH DIFFERENTIAL/PLATELET
Abs Immature Granulocytes: 0.14 10*3/uL — ABNORMAL HIGH (ref 0.00–0.07)
Basophils Absolute: 0.1 10*3/uL (ref 0.0–0.1)
Basophils Relative: 1 %
Eosinophils Absolute: 0.4 10*3/uL (ref 0.0–0.5)
Eosinophils Relative: 3 %
HCT: 37.3 % — ABNORMAL LOW (ref 39.0–52.0)
Hemoglobin: 11.4 g/dL — ABNORMAL LOW (ref 13.0–17.0)
Immature Granulocytes: 1 %
Lymphocytes Relative: 3 %
Lymphs Abs: 0.4 10*3/uL — ABNORMAL LOW (ref 0.7–4.0)
MCH: 26.1 pg (ref 26.0–34.0)
MCHC: 30.6 g/dL (ref 30.0–36.0)
MCV: 85.4 fL (ref 80.0–100.0)
Monocytes Absolute: 1.4 10*3/uL — ABNORMAL HIGH (ref 0.1–1.0)
Monocytes Relative: 12 %
Neutro Abs: 9.5 10*3/uL — ABNORMAL HIGH (ref 1.7–7.7)
Neutrophils Relative %: 80 %
Platelets: 464 10*3/uL — ABNORMAL HIGH (ref 150–400)
RBC: 4.37 MIL/uL (ref 4.22–5.81)
RDW: 16.1 % — ABNORMAL HIGH (ref 11.5–15.5)
WBC: 11.9 10*3/uL — ABNORMAL HIGH (ref 4.0–10.5)
nRBC: 0 % (ref 0.0–0.2)

## 2021-07-20 LAB — COMPREHENSIVE METABOLIC PANEL
ALT: 55 U/L — ABNORMAL HIGH (ref 0–44)
AST: 27 U/L (ref 15–41)
Albumin: 2.5 g/dL — ABNORMAL LOW (ref 3.5–5.0)
Alkaline Phosphatase: 228 U/L — ABNORMAL HIGH (ref 38–126)
Anion gap: 12 (ref 5–15)
BUN: 24 mg/dL — ABNORMAL HIGH (ref 8–23)
CO2: 23 mmol/L (ref 22–32)
Calcium: 9.2 mg/dL (ref 8.9–10.3)
Chloride: 97 mmol/L — ABNORMAL LOW (ref 98–111)
Creatinine, Ser: 0.99 mg/dL (ref 0.61–1.24)
GFR, Estimated: >60 mL/min (ref >60)
Glucose, Bld: 88 mg/dL (ref 70–99)
Potassium: 4.7 mmol/L (ref 3.5–5.1)
Sodium: 132 mmol/L — ABNORMAL LOW (ref 135–145)
Total Bilirubin: 0.7 mg/dL (ref 0.3–1.2)
Total Protein: 6.2 g/dL — ABNORMAL LOW (ref 6.5–8.1)

## 2021-07-20 LAB — TROPONIN I (HIGH SENSITIVITY)
Troponin I (High Sensitivity): 17 ng/L (ref <18)
Troponin I (High Sensitivity): 17 ng/L (ref <18)

## 2021-07-20 MED ORDER — OXYCODONE HCL 5 MG PO TABS
5.0000 mg | ORAL_TABLET | Freq: Four times a day (QID) | ORAL | 0 refills | Status: DC | PRN
Start: 2021-07-20 — End: 2021-07-20

## 2021-07-20 MED ORDER — IOHEXOL 350 MG/ML SOLN
75.0000 mL | Freq: Once | INTRAVENOUS | Status: AC | PRN
  Administered 2021-07-20: 75 mL via INTRAVENOUS

## 2021-07-20 MED ORDER — OXYCODONE HCL 5 MG PO TABS: 5.0000 mg | ORAL_TABLET | Freq: Four times a day (QID) | ORAL | 0 refills | Status: AC | PRN

## 2021-07-20 MED ORDER — ACETAMINOPHEN 500 MG PO TABS
1000.0000 mg | ORAL_TABLET | Freq: Once | ORAL | Status: AC
  Administered 2021-07-20: 1000 mg via ORAL
  Filled 2021-07-20: qty 2

## 2021-07-20 NOTE — ED Provider Notes (Signed)
Emergency Medicine Provider Triage Evaluation Note  Anthony Hartman , a 80 y.o. male  was evaluated in triage.  Pt complains of right rib/chest pain times greater than a month.  He is also having arm pain that is worse in his armpit.  Primarily to the right side of his chest.  He has been having fevers, denies any other symptoms..  Review of Systems  Positive: Chest/rib pain, arm pain, fever Negative: SOB  Physical Exam  BP 124/61 (BP Location: Left Arm)   Pulse 81   Temp 98.8 F (37.1 C) (Oral)   Resp 18   SpO2 98%  Gen:   Awake, no distress   Resp:  Normal effort  MSK:   Moves extremities without difficulty  Other:    Medical Decision Making  Medically screening exam initiated at 10:57 AM.  Appropriate orders placed.  [name] was informed that the remainder of the evaluation will be completed by another provider, this initial triage assessment does not replace that evaluation, and the importance of remaining in the ED until their evaluation is complete.  Unable to see a x-ray done yesterday, looks like it was a pleural effusion.  Patient having right-sided pain so lower suspicion for ACS but will check chest pain labs given his age and vague symptoms.  Could be some infectious etiology, but he is not febrile or tachycardic so did not initiate any lactic acid or septic work-up   Sherrill Raring, PA-C 07/20/21 1059    Tegeler, Gwenyth Allegra, MD 07/20/21 1113

## 2021-07-20 NOTE — ED Notes (Signed)
ED Provider at bedside. 

## 2021-07-20 NOTE — ED Notes (Signed)
Family at bedside and updated as to patient's status.

## 2021-07-20 NOTE — ED Provider Notes (Signed)
[facility] EMERGENCY DEPARTMENT Provider Note   CSN: [id number] Arrival date & time: 07/20/21  1042     History Chief Complaint  Patient presents with   Arm Pain    Anthony Hartman is a 80 y.o. male with pertinent PMH of CAD s/p CABG on 08/2020, HTN, HLD, COPD, prostate cancer, and bladder cancer presenting with progressively worsening right axillary pain which started several weeks ago. He states that the pain is severe, sharp , and "mostly constant" but sometimes waxing and waning. Pain relieved with oxycodone, which he is prescribed for chronic back pain. The pain radiates along the right mid-axillary line. Pt recently had XR as an outpatient showing persistent pleural effusion, with f/u recommendation for a CT. Due to a wait-time of 1-2 weeks for outpatient imaging, he decided to come to the ED.   He denies any recent trauma. ROS positive for an unintentional 30-40 lb weight loss within past year, which he attributes to a loss of appetite. Pt complains of fever and a mild productive cough over the past several days. He has not been hospitalized for acute COPD exacerbation recently, and no longer smokes.    Arm Pain Associated symptoms include abdominal pain and shortness of breath. Pertinent negatives include no chest pain and no headaches.      Past Medical History:  Diagnosis Date   Anxiety    Arthritis    Bladder cancer ([place])    Bladder stone    BPH (benign prostatic hypertrophy)    Depression    GERD (gastroesophageal reflux disease)    History of kidney stones    History of myocardial infarction    07-17-2011--   MI W/ OCCLUDED OM1. 2017 occluded RCA treated with a bare-metal stent, high-grade ramus intermediate stenosis in 2018 with failed attempted PCI   Hyperlipidemia    Hypertension    Myocardial infarction [facility]) 2012; 2017   2012 - noted Subtotal CTO of OM/RI;  09/2016 - Acute Infer STEMI - BMS x 2 mRCA (3.5 mm x 15 & 3.5 x 12 - tapered 4.0 - 3.8  mm)   OSA (obstructive sleep apnea)    cpap non-compliant    Prostate cancer [facility])     Patient Active Problem List   Diagnosis Date Noted   S/P AVR 06/25/2021   S/P CABG x 4 08/26/2020   Unstable angina ([place]) 08/22/2020   Severe aortic stenosis 11/22/2019   Educated about COVID-19 virus infection 11/22/2019   CAD (coronary artery disease), native coronary artery 04/22/2019   Angina, class II ([place]) - New onset 04/22/2019   Hyperlipidemia with target LDL less than 70; statin intolerant.  Now on Repatha 04/22/2019   S/P lumbar laminectomy 04/04/2017   Malignant neoplasm of prostate ([place]) 62/83/6629   Complicated UTI (urinary tract infection) 04/21/2013   Essential hypertension 04/21/2013   Urinary bladder stone 04/21/2013    Past Surgical History:  Procedure Laterality Date   AORTIC VALVE REPLACEMENT N/A 08/26/2020   Procedure: AORTIC VALVE REPLACEMENT (AVR) USING INSPIRIS VALVE SIZE 25MM;  Surgeon: Gaye Pollack, MD;  Location: [facility];  Service: Open Heart Surgery;  Laterality: N/A;   ARTHRODESIS METATARSALPHALANGEAL JOINT (MTPJ) Right 02/20/2018   Procedure: Right Hallux Metatarsal Phalangeal Joint Arthrodesis; Right Silver Bunionectomy;  Surgeon: Wylene Simmer, MD;  Location: [facility];  Service: Orthopedics;  Laterality: Right;   BACK SURGERY     CARDIOVASCULAR STRESS TEST  04/22/11   CORONARY ANGIOPLASTY  03/2017    failed  PCI RI (OM1) 2018 -- similar to 2012.   CORONARY ARTERY BYPASS GRAFT N/A 08/26/2020   Procedure: CORONARY ARTERY BYPASS GRAFTING (CABG) TIMES FIVE, USING LEFT INTERNAL MAMMARY ARTERY AND RIGHT GREATER SAPHENOUS VEIN HARVESTED ENDOSCOPICALLY;  Surgeon: Gaye Pollack, MD;  Location: [facility];  Service: Open Heart Surgery;  Laterality: N/A;   CORONARY STENT INTERVENTION  09/2016   [facility] (Acute Inferior STEMI)- Acute mid RCA 100% --> Integrity BMS 3.5 mm x 81m (3.8 mm) & 3.5 mm x 12 mm (4.1 mm) --BMS used because of need for back  surgery   CORONARY STENT INTERVENTION N/A 05/01/2019   Procedure: CORONARY STENT INTERVENTION;  Surgeon: VJettie Booze MD;  [place];  Service: Cardiovascular;  Laterality: N/A;   CYSTO/ BLADDER BX'S  X2  2001  / X1  2003   CYSTO/ BLADDER BX'S/ BILATERAL RETROGRADE URETEROPYLEGRAM  2003; 2007; 2008;  12-30-2007   CARCINOMA IN SITU OF BLADDER   CYSTOSCOPY WITH LITHOLAPAXY N/A 05/07/2013   Procedure: CYSTOSCOPY WITH LITHOLAPAXY;  Surgeon: SFranchot Gallo MD;  [facility];  Service: Urology;  Laterality: N/A;   EHL Lengthening Right 05/10/2017   RT FOOT   Hammer Toe Repair Right 05/10/2017   Rt #2 Toe   HAMMERTOE RECONSTRUCTION WITH WEIL OSTEOTOMY Right 02/20/2018   Procedure: Right Second Metatarsal WPriscille Heidelbergand Revision Hammertoe Correction;  Surgeon: HWylene Simmer MD;  [place]  Service: Orthopedics;  Laterality: Right;   INTRAVASCULAR PRESSURE WIRE/FFR STUDY N/A 08/23/2020   Procedure: INTRAVASCULAR PRESSURE WIRE/FFR STUDY;  Surgeon: SBelva Crome MD;  [facility];  Service: Cardiovascular;  Laterality: N/A;   LEFT HEART CATH AND CORONARY ANGIOGRAPHY  07/17/2011   OCCLUDED OM1 WITH UNSUCCESSFUL PCI ATTEMPT, O/W MILD TO MODERATE DISEASE, DR ANDY CHIU ([facility])     LEFT HEART CATH AND CORONARY ANGIOGRAPHY  09/2016   Acute Inferior STEMI ([facility])  - Mid LAD 30%, D2 50%.  OM 1/RI 100%, mid circumflex 30%.  Acute mRCA 100% (thrombotic) - BMS PCI.  EF 50% with inferobasal hypokinesis.   LEFT HEART CATH AND CORONARY ANGIOGRAPHY  03/2017   (Dr. RChuck Hint- [facility]): OM(RI) ~ CTO (unfixable PCI unable to cross with wire/balloon. pLAD 40%, ost D1 40%;  prox-mid RCA 15%.  Mild stenosis with small mid LCx.  EF 60%.   LEFT HEART CATH AND CORONARY ANGIOGRAPHY N/A 05/01/2019   Procedure: LEFT HEART CATH AND CORONARY ANGIOGRAPHY;  Surgeon: VJettie Booze MD;  [facility];   Service: Cardiovascular;  Laterality: N/A;   LUMBAR DECOMPRESSION FORAMINOTOMY L3 -- L5/  REMOVAL CYST L4-5 FACET JOINT  08-21-2009   ALSO HAD PRIOR BACK SURG IN 1992   LUMBAR LAMINECTOMY  04/04/2017   L2-3   LUMBAR LAMINECTOMY/DECOMPRESSION MICRODISCECTOMY N/A 04/04/2017   Procedure: Laminectomy and Foraminotomy - Lumbar Two-Three;  Surgeon: JEustace Moore MD;  [place] [facility]  Service: Neurosurgery;  Laterality: N/A;   OPEN TENOTOMY OF FLEXOR 2ND AND 3RD RIGHT TOES  APRIL 2008   PROSTATE BIOPSY     RIGHT/LEFT HEART CATH AND CORONARY ANGIOGRAPHY N/A 08/23/2020   Procedure: RIGHT/LEFT HEART CATH AND CORONARY ANGIOGRAPHY;  Surgeon: SBelva Crome MD;  [facility];  Service: Cardiovascular;  Laterality: N/A;   TEE WITHOUT CARDIOVERSION N/A 08/26/2020   Procedure: TRANSESOPHAGEAL ECHOCARDIOGRAM (TEE);  Surgeon: BGaye Pollack MD;  [place] [facility]  Service: Open Heart Surgery;  Laterality: N/A;   TRANSTHORACIC ECHOCARDIOGRAM  07/17/2011   at [facility], MILD TO MODERATE CONCENTRIC LVH/ NORMAL LVSF/ EF 55-60%/ MILD AORTIC STENOSIS   TRANSTHORACIC ECHOCARDIOGRAM  06/2018   ([place]): Normal LV function/thickness.  EF 50 to 55%.  Normal wall motion.  Mild to moderate MAC.  Moderate to Severe Aortic Stenosis (calcified).  Mild aortic insufficiency.   TRANSURETHRAL RESECTION OF BLADDER TUMOR  01-10-2004   TRANSURETHRAL RESECTION OF PROSTATE N/A 05/07/2013   Procedure: TRANSURETHRAL RESECTION OF THE PROSTATE WITH GYRUS INSTRUMENTS;  Surgeon: Franchot Gallo, MD;  [facility];  Service: Urology;  Laterality: N/A;       Family History  Problem Relation Age of Onset   Cancer Father    Cancer Mother        breast   Heart failure Mother     Social History   Tobacco Use   Smoking status: Former    Packs/day: 0.50    Years: 50.00    Pack years: 25.00    Types: Cigarettes    Quit date: 04/15/2010    Years since quitting: 11.2   Smokeless tobacco: Current     Types: Snuff   Tobacco comments:    occasional  snuff pack  Vaping Use   Vaping Use: Never used  Substance Use Topics   Alcohol use: Yes    Alcohol/week: 2.0 standard drinks    Types: 2 Cans of beer per week    Comment: social   Drug use: No    Home Medications Prior to Admission medications   Medication Sig Start Date End Date Taking? Authorizing Provider  acetaminophen (TYLENOL) 500 MG tablet Take 500 mg by mouth every 6 (six) hours as needed (pain.).    [provider]  ALPRAZolam Duanne Moron) 1 MG tablet Take 0.5 mg by mouth at bedtime. 02/28/19   [provider]  aspirin EC 81 MG tablet Take 81 mg by mouth daily. Swallow whole.    [provider]  budesonide (ENTOCORT EC) 3 MG 24 hr capsule Take 9 mg by mouth daily. 06/20/21   [provider]  buPROPion (WELLBUTRIN XL) 300 MG 24 hr tablet Take 300 mg by mouth daily.    [provider]  carvedilol (COREG) 6.25 MG tablet TAKE 1 TABLET BY MOUTH TWICE (2) DAILY 02/03/21   Minus Breeding, MD  Evolocumab (REPATHA SURECLICK) 161 MG/ML SOAJ Inject 140 mg into the skin every 14 (fourteen) days. 12/12/20   Minus Breeding, MD  meloxicam (MOBIC) 7.5 MG tablet Take 1 tablet (7.5 mg total) by mouth daily as needed for pain. 05/02/20   Minus Breeding, MD  Multiple Vitamin (MULTIVITAMIN WITH MINERALS) TABS tablet Take 1 tablet by mouth daily.    [provider]  omeprazole (PRILOSEC) 40 MG capsule Take 40 mg by mouth daily. 06/14/21   [provider]  ondansetron (ZOFRAN) 4 MG tablet Take 1 tablet (4 mg total) by mouth every 8 (eight) hours as needed for nausea. 09/21/20 09/21/21  Gaye Pollack, MD  oxyCODONE (OXY IR/ROXICODONE) 5 MG immediate release tablet Take 1 tablet (5 mg total) by mouth every 6 (six) hours as needed for severe pain. 09/12/20   Barrett, Erin R, PA-C  Polyethyl Glycol-Propyl Glycol (LUBRICANT EYE DROPS) 0.4-0.3 % SOLN Place 1 drop into both eyes 3 (three) times daily as  needed (dry/irritated eyes).    [provider]  testosterone cypionate (DEPOTESTOSTERONE CYPIONATE) 200 MG/ML injection Inject 120 mg into the muscle every 14 (fourteen)  days.  03/27/19   [provider]  valACYclovir (VALTREX) 1000 MG tablet Take 1,000 mg by mouth 3 (three) times daily as needed (fever blisters).  03/03/17   [provider]    Allergies    Statins, Augmentin [amoxicillin-pot clavulanate], Dexamethasone, and Sulfa antibiotics  Review of Systems   Review of Systems  Constitutional:  Positive for chills and fever. Negative for appetite change.  HENT: Negative.    Eyes: Negative.   Respiratory:  Positive for cough and shortness of breath. Negative for choking and chest tightness.   Cardiovascular:  Negative for chest pain, palpitations and leg swelling.  Gastrointestinal:  Positive for abdominal pain. Negative for diarrhea and vomiting.  Genitourinary:  Negative for dysuria and frequency.  Neurological:  Positive for weakness. Negative for syncope, speech difficulty and headaches.  Psychiatric/Behavioral:  Negative for agitation and confusion.     Physical Exam Updated Vital Signs BP 118/64 (BP Location: Left Arm)   Pulse 83   Temp 97.9 F (36.6 C) (Oral)   Resp 20   Ht _0  (1.93 m)   Wt 92.9 kg Comment: previous chart  SpO2 96%   BMI 24.93 kg/m   Physical Exam Constitutional:      General: He is not in acute distress.    Appearance: Normal appearance.  HENT:     Head: Normocephalic and atraumatic.  Eyes:     Extraocular Movements: Extraocular movements intact.     Conjunctiva/sclera: Conjunctivae normal.  Cardiovascular:     Rate and Rhythm: Normal rate and regular rhythm.     Pulses: Normal pulses.  Pulmonary:     Effort: Pulmonary effort is normal. No respiratory distress.     Breath sounds: Normal breath sounds. No wheezing.  Abdominal:     General: Abdomen is flat.     Palpations: Abdomen is soft.  Musculoskeletal:      Cervical back: Normal range of motion and neck supple.     Comments: Hyperalgesia to light touch along right mid-axillary line with no signs of vesicular lesions or trauma.   Skin:    General: Skin is warm and dry.  Neurological:     General: No focal deficit present.     Mental Status: He is alert and oriented to person, place, and time.  Psychiatric:        Mood and Affect: Mood normal.        Behavior: Behavior normal.        Thought Content: Thought content normal.    ED Results / Procedures / Treatments   Labs (all labs ordered are listed, but only abnormal results are displayed) Labs Reviewed  COMPREHENSIVE METABOLIC PANEL - Abnormal; Notable for the following components:      Result Value   Sodium 132 (*)    Chloride 97 (*)    BUN 24 (*)    Total Protein 6.2 (*)    Albumin 2.5 (*)    ALT 55 (*)    Alkaline Phosphatase 228 (*)    All other components within normal limits  CBC WITH DIFFERENTIAL/PLATELET - Abnormal; Notable for the following components:   WBC 11.9 (*)    Hemoglobin 11.4 (*)    HCT 37.3 (*)    RDW 16.1 (*)    Platelets 464 (*)    Neutro Abs 9.5 (*)    Lymphs Abs 0.4 (*)    Monocytes Absolute 1.4 (*)    Abs Immature Granulocytes 0.14 (*)    All other components within  normal limits  TROPONIN I (HIGH SENSITIVITY)  TROPONIN I (HIGH SENSITIVITY)    EKG EKG Interpretation  Date/Time:  Thursday July 20 2021 10:56:02 EDT Ventricular Rate:  83 PR Interval:  158 QRS Duration: 134 QT Interval:  402 QTC Calculation: 472 R Axis:   63 Text Interpretation: Normal sinus rhythm Right bundle branch block No significant change since last tracing Artifact Confirmed by Blanchie Dessert [phone]) on 07/20/2021 12:07:43 PM  Radiology DG Chest 2 View  Result Date: 07/20/2021 CLINICAL DATA:  right sided chest pain and arm pain EXAM: CHEST - 2 VIEW COMPARISON:  Multiple priors FINDINGS: The cardiomediastinal silhouette is within normal limits. Surgical changes of  median sternotomy and heart valve replacement. Small to moderate right pleural effusion. No pneumothorax. Right lung base opacity. IMPRESSION: Small to moderate right pleural effusion with likely partial atelectasis of the right lower lobe. These findings are new/worsened since January 2022. Chest CT can be performed for further evaluation. Electronically Signed   By: Albin Felling M.D.   On: 07/20/2021 12:01    Procedures Procedures   Medications Ordered in ED Medications  acetaminophen (TYLENOL) tablet 1,000 mg (1,000 mg Oral Given 07/20/21 1258)    ED Course  I have reviewed the triage vital signs and the nursing notes.  Pertinent labs & imaging results that were available during my care of the patient were reviewed by me and considered in my medical decision making (see chart for details).  Clinical Course as of 07/20/21 1652  Thu Jul 20, 2021  1205 WBC(!): 11.9 [MP]  1353 CT CHEST ABDOMEN PELVIS W CONTRAST [MP]  1157 CT CHEST ABDOMEN PELVIS W CONTRAST [MP]  2620 CT CHEST ABDOMEN PELVIS W CONTRAST [MP]  3559 CT CHEST ABDOMEN PELVIS W CONTRAST [MP]    Clinical Course User Index [MP] Lajean Manes, MD   MDM Rules/Calculators/A&P                           Pt with worsening right axillary pain ongoing for several weeks in setting of XR showing persistent mild-moderate right pleural effusion. On exam, he has hyperalgesia to light touch; no vesicular diease suggestive of dermatomal disease, no joint deformity, and no history of trauma. Troponins unremarkable. Hyponatremia 132 likely from poor hydration status. CT showing invasive R lung cancer with signs of chest wall invasion, concerning for primary bronchial carcinoma vs mesthothelioma. Oncology contacted, who will be in contact with patient tomorrow for workup; he is scheduled for an outpatient thoracentesis tomorrow at [facility]. Pt prescribed oxycodone for his pain.    Final Clinical Impression(s) / ED Diagnoses Final diagnoses:  None     Rx / DC Orders ED Discharge Orders     None        Lajean Manes, MD 07/20/21 1654    Blanchie Dessert, MD 07/24/21 [phone]

## 2021-07-20 NOTE — Discharge Instructions (Addendum)
Ms. Massie Maroon you came to the ED for right sided pain in the armpit. CT of the chest shows right sided lung cancer which will require further workup to determine management. STOP taking the new antibiotics you were prescribed recently. Please ARRIVE tomorrow to [facility] TOMORROW for your thoracentesis and then follow up with the oncologist who will be in contact with you.

## 2021-07-20 NOTE — ED Notes (Signed)
Pt reports 30 + lb weight loss in the last 6 mons, recent colonoscopy was negative. Pt reports pain to his Right axillary area for several days, on call RN ordered a CT scan earlier today but cant get in for 2 weeks. No nodules noted on assessment. Pt denies pain w/deep breathing. Endorses a mild productive cough with small thick white secretions and some abd discomfort.

## 2021-07-20 NOTE — ED Triage Notes (Signed)
C/O pain on right side underneath the armpit x 4 weeks, relieves by stretching. Stated it feels like he pulled something

## 2021-07-21 ENCOUNTER — Telehealth: Payer: Self-pay | Admitting: *Deleted

## 2021-07-21 ENCOUNTER — Encounter: Payer: Self-pay | Admitting: Family Medicine

## 2021-07-21 DIAGNOSIS — R918 Other nonspecific abnormal finding of lung field: Secondary | ICD-10-CM

## 2021-07-21 NOTE — Telephone Encounter (Signed)
I received a call back from Mr and Ms Boylen regarding an appt. I updated and verbalized understanding.

## 2021-07-21 NOTE — Telephone Encounter (Signed)
I received referral on Anthony Hartman yesterday. I called him today to set him up for an appt. I was unable to reach. I did leave vm message with my name and phone number to call.

## 2021-07-24 ENCOUNTER — Telehealth: Payer: Self-pay | Admitting: Oncology

## 2021-07-24 ENCOUNTER — Other Ambulatory Visit: Payer: Self-pay | Admitting: Oncology

## 2021-07-24 DIAGNOSIS — D539 Nutritional anemia, unspecified: Secondary | ICD-10-CM

## 2021-07-24 DIAGNOSIS — C3481 Malignant neoplasm of overlapping sites of right bronchus and lung: Secondary | ICD-10-CM

## 2021-07-24 DIAGNOSIS — J90 Pleural effusion, not elsewhere classified: Secondary | ICD-10-CM | POA: Diagnosis not present

## 2021-07-24 NOTE — Progress Notes (Signed)
[facility]  [address] [place],  [place]  [address] [phone]  Clinic Day:  07/25/2021  Referring physician: Myrlene Broker, MD  This document serves as a record of services personally performed by Hosie Poisson, MD. It was created on their behalf by [facility], a trained medical scribe. The creation of this record is based on the scribe's personal observations and the provider's statements to them.  CHIEF COMPLAINT:  CC: "Lung cancer"  Current Treatment:  Diagnostics   HISTORY OF PRESENT ILLNESS:  Anthony Hartman is a 80 y.o. male referred by Dr. Janace Litten for the evaluation and treatment of lung cancer. This began when the patient presented to the [facility] emergency department on September 8th due to right sided rib and right axillary line pain, as well as gradually worsening shortness of breath and unintentional weight loss. Chest x-ray revealed small to moderate right pleural effusion with likely partial atelectasis of the right lower lobe. However, CT chest, abdomen and pelvis revealed an extensive, circumferential rind of tumor encasing the entire right lung. There were signs of chest wall invasion as well as invasion through the right hemidiaphragm. Loculated right pleural fluid collection was present. Enlarged right hilar, right CP angle and right paratracheal lymph nodes compatible with metastatic adenopathy were also observed. There was no evidence for solid organ metastasis or nodal metastasis within the abdomen or pelvis. He underwent right thoracentesis on September 12th yielding 275 mL serosanguineous pleural fluid. Diagnostic have been ordered. He may have had exposure to asbestos and or chemicals in his youth.  Of note, the patient has a history of Gleason 7 prostate cancer, diagnosed March 2017 and treated with radiation as well as bladder carcinoma in situ.  He also has a significant history for hypertension, myocardial  infarction, obstructive sleep apnea, GERD, depression, and anxiety.   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 He states that the underwent bypass surgery in October 2021 and has not been able to bounce back. Prior to that he was active and playing golf. He reports progressive extremity weakness and unsteadiness.  His denies much pain today, and has oxycodone and meloxicam to use if needed. He reports intermittent cough, occasionally productive with yellow sputum. He reports stiffness of the neck, and has had prior surgery of the cervical spine as well as two surgeries of the lumbar spine. White count is elevated at 12.2, hemoglobin is 10.9, and platelet count is 502,000.  Chemistries are remarkable for a BUN of 31 and an alkaline phosphatase of 241. His  appetite is good, and is eating well.  He has lost a around 40 pounds total since October.  He denies fever, chills or other signs of infection.  He denies nausea, vomiting, bowel issues, or abdominal pain.  He denies sore throat, cough, dyspnea, or chest pain.  REVIEW OF SYSTEMS:  Review of Systems  Constitutional:  Positive for appetite change (poor) and unexpected weight change (40 pounds weight loss). Negative for chills, fatigue and fever.  HENT:  Negative.    Eyes: Negative.   Respiratory:  Positive for cough (occasionally productive with yellow sputum). Negative for chest tightness, hemoptysis, shortness of breath and wheezing.   Cardiovascular: Negative.  Negative for chest pain, leg swelling and palpitations.  Gastrointestinal: Negative.  Negative for abdominal distention, abdominal pain, blood in stool, constipation, diarrhea, nausea and vomiting.  Endocrine: Negative.   Genitourinary: Negative.  Negative for difficulty  urinating, dysuria, frequency and hematuria.   Musculoskeletal:  Positive for gait problem (unsteadiness) and neck stiffness. Negative for arthralgias, back pain, flank pain and  myalgias.  Skin: Negative.   Neurological:  Positive for extremity weakness (in a wheelchair) and gait problem (unsteadiness). Negative for dizziness, headaches, light-headedness, numbness, seizures and speech difficulty.  Hematological: Negative.   Psychiatric/Behavioral: Negative.  Negative for depression and sleep disturbance. The patient is not nervous/anxious.   All other systems reviewed and are negative.   VITALS:  Blood pressure 105/67, pulse 82, temperature 98.2 F (36.8 C), temperature source Oral, resp. rate 16, height _0  (1.93 m), weight 197 lb 6.4 oz (89.5 kg), SpO2 92 %.  Wt Readings from Last 3 Encounters:  07/25/21 197 lb 6.4 oz (89.5 kg)  07/20/21 204 lb 12.9 oz (92.9 kg)  06/26/21 204 lb 14.4 oz (92.9 kg)    Body mass index is 24.03 kg/m.  Performance status (ECOG): 2 - Symptomatic, <50% confined to bed  PHYSICAL EXAM:  Physical Exam Constitutional:      General: He is not in acute distress.    Appearance: Normal appearance. He is normal weight.  HENT:     Head: Normocephalic and atraumatic.  Eyes:     General: No scleral icterus.    Extraocular Movements: Extraocular movements intact.     Conjunctiva/sclera: Conjunctivae normal.     Pupils: Pupils are equal, round, and reactive to light.  Cardiovascular:     Rate and Rhythm: Normal rate and regular rhythm.     Pulses: Normal pulses.     Heart sounds: Murmur heard.  Systolic murmur is present with a grade of 1/6.    No friction rub. No gallop.     Comments: Mid systolic click Pulmonary:     Effort: Pulmonary effort is normal. No respiratory distress.     Breath sounds: Decreased breath sounds (in the lower half of the right lung) present.  Chest:     Chest wall: Tenderness present.  Abdominal:     General: Bowel sounds are normal. There is no distension.     Palpations: Abdomen is soft. There is no hepatomegaly, splenomegaly or mass.     Tenderness: There is no abdominal tenderness.   Musculoskeletal:        General: Normal range of motion.     Cervical back: Normal range of motion and neck supple.     Right lower leg: No edema.     Left lower leg: No edema.  Lymphadenopathy:     Cervical: No cervical adenopathy.  Skin:    General: Skin is warm and dry.     Findings: Bruising (scattered of the arms) present.  Neurological:     General: No focal deficit present.     Mental Status: He is alert and oriented to person, place, and time. Mental status is at baseline.  Psychiatric:        Mood and Affect: Mood normal.        Behavior: Behavior normal.        Thought Content: Thought content normal.        Judgment: Judgment normal.    LABS:   CBC Latest Ref Rng & Units 07/25/2021 07/20/2021 08/29/2020  WBC - 12.2 11.9(H) 9.2  Hemoglobin 13.5 - 17.5 10.9(A) 11.4(L) 13.3  Hematocrit 41 - 53 34(A) 37.3(L) 39.6  Platelets 150 - 399 502(A) 464(H) 125(L)   CMP Latest Ref Rng & Units 07/25/2021 07/20/2021 08/31/2020  Glucose 70 - 99 mg/dL -  88 107(H)  BUN 4 - 21 31(A) 24(H) 24(H)  Creatinine 0.6 - 1.3 1.1 0.99 1.35(H)  Sodium 137 - 147 134(A) 132(L) 135  Potassium 3.4 - 5.3 4.6 4.7 4.1  Chloride 99 - 108 99 97(L) 99  CO2 13 - 22 24(A) 23 26  Calcium 8.7 - 10.7 9.4 9.2 9.0  Total Protein 6.5 - 8.1 g/dL - 6.2(L) -  Total Bilirubin 0.3 - 1.2 mg/dL - 0.7 -  Alkaline Phos 25 - 125 241(A) 228(H) -  AST 14 - 40 36 27 -  ALT 10 - 40 49(A) 55(H) -     No results found for: CEA1 / No results found for: CEA1 No results found for: PSA1 No results found for: IRS854 No results found for: OEV035  No results found for: TOTALPROTELP, ALBUMINELP, A1GS, A2GS, BETS, BETA2SER, GAMS, MSPIKE, SPEI No results found for: TIBC, FERRITIN, IRONPCTSAT No results found for: LDH  STUDIES:  DG Chest 2 View  Result Date: 07/20/2021 CLINICAL DATA:  right sided chest pain and arm pain EXAM: CHEST - 2 VIEW COMPARISON:  Multiple priors FINDINGS: The cardiomediastinal silhouette is within normal  limits. Surgical changes of median sternotomy and heart valve replacement. Small to moderate right pleural effusion. No pneumothorax. Right lung base opacity. IMPRESSION: Small to moderate right pleural effusion with likely partial atelectasis of the right lower lobe. These findings are new/worsened since January 2022. Chest CT can be performed for further evaluation. Electronically Signed   By: Albin Felling M.D.   On: 07/20/2021 12:01   CT CHEST ABDOMEN PELVIS W CONTRAST  Result Date: 07/20/2021 CLINICAL DATA:  Unintentional weight loss.  Pain in right axilla. EXAM: CT CHEST, ABDOMEN, AND PELVIS WITH CONTRAST TECHNIQUE: Multidetector CT imaging of the chest, abdomen and pelvis was performed following the standard protocol during bolus administration of intravenous contrast. CONTRAST:  75m OMNIPAQUE IOHEXOL 350 MG/ML SOLN COMPARISON:  06/27/2021 FINDINGS: CT CHEST FINDINGS Cardiovascular: The heart size appears within normal limits. No pericardial effusion. Mediastinum/Nodes: High right paratracheal lymph node measures 1.6 cm, image 20/3. Lower right paratracheal lymph node measures 1.7 cm, image 28/3. Right hilar lymph node is enlarged measuring 2.4 cm, image 30/3. Multiple right CP angle lymph nodes are identified measuring up to 1.6 cm, image 52/3. Normal appearance of the thyroid gland. The trachea appears patent and is midline. Normal appearance of the esophagus Lungs/Pleura: Thick, irregular rind of tumor encases the entire right lung with loculated pleural fluid overlying the right lower lobe. Sample measurements of the tumor rind include: -at the level of the posterior right apex this measures 1.2 cm, image 14/3. -At the level of the right mid lung this measures 2.2 cm laterally, image 25/3. -Within the deep right lateral costophrenic sulcus adjacent to the right hepatic lobe this measures 2.5 cm in thickness, image 68/3. Tumor involvement of the right lateral and posterior hemidiaphragm is suspected,  image 79/6 and image 29/7. Moderate changes of centrilobular and paraseptal emphysema. Several calcified nodules are noted overlying the posterior left lower lobe. No definite evidence for tumor involvement of the left lung. Musculoskeletal: There are signs of right lateral chest wall involvement by pleural disease. Most notable is at the level of the mid right upper lobe where pleural tumor invades the chest wall and there are associated erosive changes involving the lateral aspect of the righta fourth rib, image 25/3. CT ABDOMEN PELVIS FINDINGS Hepatobiliary: Pleural tumor along the deep right costophrenic sulcus has mass effect upon the lateral aspect of  the right hepatic lobe, image 69/3. No convincing evidence for tumor metastasis to the liver. The gallbladder appears normal. Pancreas: Unremarkable. No pancreatic ductal dilatation or surrounding inflammatory changes. Spleen: Normal in size without focal abnormality. Adrenals/Urinary Tract: Normal appearance of the adrenal glands. Bilateral kidney cysts. The largest arises off the lateral cortex of the left mid kidney measuring 3.6 cm. Urinary bladder is unremarkable. Stomach/Bowel: Stomach is nondistended. No scratch set the appendix is visualized and appears normal. No bowel wall thickening, inflammation, or distension. Vascular/Lymphatic: Aortic atherosclerosis without aneurysm. No adenopathy identified within the abdomen or pelvis. Reproductive: Prostate gland appears atrophic. Other: No ascites or focal fluid collections. Musculoskeletal: Spondylosis noted within the lumbar spine. IMPRESSION: 1. Extensive, circumferential rind of tumor encases the entire right lung. There are signs of chest wall invasion as well as invasion through the right hemidiaphragm. Loculated right pleural fluid collection is present. Primary differential considerations include primary bronchogenic carcinoma with extensive pleural metastasis versus mesothelioma. Further investigation  with PET-CT and tissue sampling advised. 2. Enlarged right hilar, right CP angle and right paratracheal lymph nodes compatible with metastatic adenopathy. 3. No evidence for solid organ metastasis or nodal metastasis within the abdomen or pelvis. 4. Emphysema and aortic atherosclerosis. Aortic Atherosclerosis (ICD10-I70.0) and Emphysema (ICD10-J43.9). Electronically Signed   By: Kerby Moors M.D.   On: 07/20/2021 15:18      HISTORY:   Past Medical History:  Diagnosis Date   Anxiety    Arthritis    Bladder cancer ([place])    Bladder stone    BPH (benign prostatic hypertrophy)    Depression    GERD (gastroesophageal reflux disease)    History of kidney stones    History of myocardial infarction    07-17-2011--   MI W/ OCCLUDED OM1. 2017 occluded RCA treated with a bare-metal stent, high-grade ramus intermediate stenosis in 2018 with failed attempted PCI   Hyperlipidemia    Hypertension    Myocardial infarction [facility]) 2012; 2017   2012 - noted Subtotal CTO of OM/RI;  09/2016 - Acute Infer STEMI - BMS x 2 mRCA (3.5 mm x 15 & 3.5 x 12 - tapered 4.0 - 3.8 mm)   OSA (obstructive sleep apnea)    cpap non-compliant    Prostate cancer [facility])     Past Surgical History:  Procedure Laterality Date   AORTIC VALVE REPLACEMENT N/A 08/26/2020   Procedure: AORTIC VALVE REPLACEMENT (AVR) USING INSPIRIS VALVE SIZE 25MM;  Surgeon: Gaye Pollack, MD;  Location: [place];  Service: Open Heart Surgery;  Laterality: N/A;   ARTHRODESIS METATARSALPHALANGEAL JOINT (MTPJ) Right 02/20/2018   Procedure: Right Hallux Metatarsal Phalangeal Joint Arthrodesis; Right Silver Bunionectomy;  Surgeon: Wylene Simmer, MD;  Location: [place];  Service: Orthopedics;  Laterality: Right;   BACK SURGERY     x2 of the lumbar spine, x1 of the cervical spine   CARDIOVASCULAR STRESS TEST  04/22/2011   CORONARY ANGIOPLASTY  03/2017    failed PCI RI (OM1) 2018 -- similar to 2012.   CORONARY ARTERY BYPASS GRAFT N/A  08/26/2020   Procedure: CORONARY ARTERY BYPASS GRAFTING (CABG) TIMES FIVE, USING LEFT INTERNAL MAMMARY ARTERY AND RIGHT GREATER SAPHENOUS VEIN HARVESTED ENDOSCOPICALLY;  Surgeon: Gaye Pollack, MD;  Location: [place];  Service: Open Heart Surgery;  Laterality: N/A;   CORONARY STENT INTERVENTION  09/2016   [facility] (Acute Inferior STEMI)- Acute mid RCA 100% --> Integrity BMS 3.5 mm x 45m (3.8 mm) & 3.5 mm x 12 mm (  4.1 mm) --BMS used because of need for back surgery   CORONARY STENT INTERVENTION N/A 05/01/2019   Procedure: CORONARY STENT INTERVENTION;  Surgeon: Jettie Booze, MD;  Location: [place] [facility];  Service: Cardiovascular;  Laterality: N/A;   CYSTO/ BLADDER BX'S  X2  2001  / X1  2003   CYSTO/ BLADDER BX'S/ BILATERAL RETROGRADE URETEROPYLEGRAM  2003; 2007; 2008;  12-30-2007   CARCINOMA IN SITU OF BLADDER   CYSTOSCOPY WITH LITHOLAPAXY N/A 05/07/2013   Procedure: CYSTOSCOPY WITH LITHOLAPAXY;  Surgeon: Franchot Gallo, MD;  Location: [facility];  Service: Urology;  Laterality: N/A;   EHL Lengthening Right 05/10/2017   RT FOOT   Hammer Toe Repair Right 05/10/2017   Rt #2 Toe   HAMMERTOE RECONSTRUCTION WITH WEIL OSTEOTOMY Right 02/20/2018   Procedure: Right Second Metatarsal Priscille [place] and Revision Hammertoe Correction;  Surgeon: Wylene Simmer, MD;  Location: [place];  Service: Orthopedics;  Laterality: Right;   INTRAVASCULAR PRESSURE WIRE/FFR STUDY N/A 08/23/2020   Procedure: INTRAVASCULAR PRESSURE WIRE/FFR STUDY;  Surgeon: Belva Crome, MD;  Location: [place] [facility];  Service: Cardiovascular;  Laterality: N/A;   LEFT HEART CATH AND CORONARY ANGIOGRAPHY  07/17/2011   OCCLUDED OM1 WITH UNSUCCESSFUL PCI ATTEMPT, O/W MILD TO MODERATE DISEASE, DR ANDY CHIU ([facility])     LEFT HEART CATH AND CORONARY ANGIOGRAPHY  09/2016   Acute Inferior STEMI ([facility])  - Mid LAD 30%, D2 50%.  OM 1/RI 100%, mid circumflex 30%.  Acute mRCA 100%  (thrombotic) - BMS PCI.  EF 50% with inferobasal hypokinesis.   LEFT HEART CATH AND CORONARY ANGIOGRAPHY  03/2017   (Dr. Chuck Hint - [facility]): OM(RI) ~ CTO (unfixable PCI unable to cross with wire/balloon. pLAD 40%, ost D1 40%;  prox-mid RCA 15%.  Mild stenosis with small mid LCx.  EF 60%.   LEFT HEART CATH AND CORONARY ANGIOGRAPHY N/A 05/01/2019   Procedure: LEFT HEART CATH AND CORONARY ANGIOGRAPHY;  Surgeon: Jettie Booze, MD;  Location: [place] [facility];  Service: Cardiovascular;  Laterality: N/A;   LUMBAR DECOMPRESSION FORAMINOTOMY L3 -- L5/  REMOVAL CYST L4-5 FACET JOINT  08/21/2009   ALSO HAD PRIOR BACK SURG IN 1992   LUMBAR LAMINECTOMY  04/04/2017   L2-3   LUMBAR LAMINECTOMY/DECOMPRESSION MICRODISCECTOMY N/A 04/04/2017   Procedure: Laminectomy and Foraminotomy - Lumbar Two-Three;  Surgeon: Eustace Moore, MD;  Location: [place];  Service: Neurosurgery;  Laterality: N/A;   OPEN TENOTOMY OF FLEXOR 2ND AND 3RD RIGHT TOES  02/2007   PROSTATE BIOPSY     RIGHT/LEFT HEART CATH AND CORONARY ANGIOGRAPHY N/A 08/23/2020   Procedure: RIGHT/LEFT HEART CATH AND CORONARY ANGIOGRAPHY;  Surgeon: Belva Crome, MD;  Location: [place] [facility];  Service: Cardiovascular;  Laterality: N/A;   TEE WITHOUT CARDIOVERSION N/A 08/26/2020   Procedure: TRANSESOPHAGEAL ECHOCARDIOGRAM (TEE);  Surgeon: Gaye Pollack, MD;  Location: [place];  Service: Open Heart Surgery;  Laterality: N/A;   TRANSTHORACIC ECHOCARDIOGRAM  07/17/2011   at [facility], MILD TO MODERATE CONCENTRIC LVH/ NORMAL LVSF/ EF 55-60%/ MILD AORTIC STENOSIS   TRANSTHORACIC ECHOCARDIOGRAM  06/2018   ([place]): Normal LV function/thickness.  EF 50 to 55%.  Normal wall motion.  Mild to moderate MAC.  Moderate to Severe Aortic Stenosis (calcified).  Mild aortic insufficiency.   TRANSURETHRAL RESECTION OF BLADDER TUMOR  01/10/2004   TRANSURETHRAL RESECTION OF PROSTATE N/A 05/07/2013   Procedure: TRANSURETHRAL RESECTION OF THE PROSTATE  WITH  GYRUS INSTRUMENTS;  Surgeon: Franchot Gallo, MD;  Location[facility];  Service: Urology;  Laterality: N/A;    Family History  Problem Relation Age of Onset   Cancer Mother        breast   Heart failure Mother    Cancer Father        lung, 31s    Social History:  reports that he quit smoking about 11 years ago. His smoking use included cigarettes. He has a 25.00 pack-year smoking history. His smokeless tobacco use includes snuff. He reports current alcohol use of about 2.0 standard drinks per week. He reports that he does not use drugs.The patient is accompanied by his wife Hassan Rowan today. He is married and lives at home with his spouse.  He has 2 children.  He is retired and has been exposed to chemicals in his youth.  Allergies:  Allergies  Allergen Reactions   Amoxicillin-Pot Clavulanate Nausea And Vomiting and Nausea Only     Has patient had a PCN reaction causing immediate rash, facial/tongue/throat swelling, SOB or lightheadedness with hypotension: No Has patient had a PCN reaction causing severe rash involving mucus membranes or skin necrosis: No Has patient had a PCN reaction that required hospitalization No Has patient had a PCN reaction occurring within the last 10 years: No If all of the above answers are "NO", then may proceed with Cephalosporin use.   Has patient had a PCN reaction causing immediate rash, facial/tongue/throat swelling, SOB or lightheadedness with hypotension: No Has patient had a PCN reaction causing severe rash involving mucus membranes or skin necrosis: No Has patient had a PCN reaction that required hospitalization No Has patient had a PCN reaction occurring within the last 10 years: No If all of the above answers are "NO", then may proceed with Cephalosporin use.   Statins Other (See Comments)    MD ORDERS UNSPECIFIED REACTION   MD ORDERS UNSPECIFIED REACTION   MD ORDERS UNSPECIFIED REACTION     Sulfa Antibiotics Nausea And Vomiting and  Nausea Only   Dexamethasone Other (See Comments)    Frequent Urination [? HYPERGLYCEMIA? ] Frequent Urination Frequent Urination [? HYPERGLYCEMIA? ] Frequent Urination [? HYPERGLYCEMIA? ]   Latex Other (See Comments)    Current Medications: Current Outpatient Medications  Medication Sig Dispense Refill   acetaminophen (TYLENOL) 500 MG tablet Take 500 mg by mouth every 6 (six) hours as needed (pain.).     ALPRAZolam (XANAX) 1 MG tablet Take 0.5 mg by mouth at bedtime as needed for sleep.     aspirin EC 81 MG tablet Take 81 mg by mouth daily. Swallow whole.     budesonide (ENTOCORT EC) 3 MG 24 hr capsule Take 9 mg by mouth daily.     buPROPion (WELLBUTRIN XL) 300 MG 24 hr tablet Take 300 mg by mouth daily.     carvedilol (COREG) 6.25 MG tablet TAKE 1 TABLET BY MOUTH TWICE (2) DAILY (Patient taking differently: Take 6.25 mg by mouth 2 (two) times daily with a meal.) 180 tablet 3   Evolocumab (REPATHA SURECLICK) 498 MG/ML SOAJ Inject 140 mg into the skin every 14 (fourteen) days. 6 mL 3   meloxicam (MOBIC) 7.5 MG tablet Take 1 tablet (7.5 mg total) by mouth daily as needed for pain. 30 tablet 3   Multiple Vitamin (MULTIVITAMIN WITH MINERALS) TABS tablet Take 1 tablet by mouth daily.     omeprazole (PRILOSEC) 40 MG capsule Take 40 mg by mouth daily.     ondansetron (  ZOFRAN) 4 MG tablet Take 1 tablet (4 mg total) by mouth every 8 (eight) hours as needed for nausea. 20 tablet 1   oxyCODONE (ROXICODONE) 5 MG immediate release tablet Take 1 tablet (5 mg total) by mouth every 6 (six) hours as needed for up to 5 days for severe pain. 20 tablet 0   Polyethyl Glycol-Propyl Glycol (LUBRICANT EYE DROPS) 0.4-0.3 % SOLN Place 1 drop into both eyes 3 (three) times daily as needed (dry/irritated eyes).     testosterone cypionate (DEPOTESTOSTERONE CYPIONATE) 200 MG/ML injection Inject 120 mg into the muscle every 14 (fourteen) days. 0.6 ml     triamcinolone (KENALOG) 0.025 % cream Apply topically.      valACYclovir (VALTREX) 1000 MG tablet Take 1,000 mg by mouth 3 (three) times daily as needed (fever blisters).      No current facility-administered medications for this visit.     ASSESSMENT & PLAN:   Assessment:   Extensive, circumferential rind of tumor encasing the entire right lung. There were signs of chest wall invasion as well as invasion through the right hemidiaphragm. This is very suggestive of a mesothelioma.    Enlarged right hilar, right CP angle and right paratracheal lymph nodes compatible with metastatic adenopathy.   Loculated right pleural fluid. He underwent thoracentesis yielding 275 mL of of serosanguineous pleural fluid. Diagnostics have been ordered. Most of the findings on CT scan are more related to tumor thickening of the pleura rather than fluid.  Generalized weakness, poor appetite and weight loss. I will try him on prednisone 10 mg BID for his appetite and dyspnea.  Intermittent cough occasionally productive with yellow sputum. We will need to watch him for signs of infection as he has mildly elevated white blood count. He may require antibiotics.  Anemia. We have drawn labs to evaluate this.  Plan: This is a pleasant 80 year old male recently found to have lung cancer with extensive circumferential rind of tumor encasing the entire right lung. There were signs of chest wall invasion as well as invasion through the right hemidiaphragm. These features are suspicious of mesothelioma. He asked about surgical resection, but as this is already spread to the surrounding tissue, this is not feasible. In order to formulate a treatment plan, our next step will be to obtain pathologic diagnosis with biopsy. Treatment options were reviewed including chemotherapy and immunotherapy, but comfort care measures were also discussed. Code status was reviewed and he does not wish to be resuscitated in the case of cardiopulmonary arrest, and so I filled out a DNR form today. He does  have a Living Will in place. We will plan to see him back after his biopsy to review the pathology and formulate a treatment plan. I will try him on prednisone 10 mg BID to try and improve his appetite and prevent further weight loss. He and his wife understand and agree with this plan of care. I have answered their questions and they know to call with any concerns.  Thank you for the opportunity to participate in the care of your patients   I provided 55 minutes of face-to-face time during this this encounter and > 50% was spent counseling as documented under my assessment and plan.    Derwood Kaplan, MD [facility] AT [facility] [address] [place] [address] Dept: [phone] Dept Fax: [phone]   I, Rita Ohara, am acting as scribe for Derwood Kaplan, MD  I have  reviewed this report as typed by the medical scribe, and it is complete and accurate.  Hermina Barters

## 2021-07-24 NOTE — Telephone Encounter (Signed)
Patient referred by Dr Janace Litten for Lung CA.  Appt made for 07/25/21 Labs 10:00 am - Consult 10:30 am

## 2021-07-25 ENCOUNTER — Other Ambulatory Visit: Payer: Self-pay | Admitting: Oncology

## 2021-07-25 ENCOUNTER — Other Ambulatory Visit: Payer: Self-pay

## 2021-07-25 ENCOUNTER — Encounter: Payer: Self-pay | Admitting: Oncology

## 2021-07-25 ENCOUNTER — Inpatient Hospital Stay (INDEPENDENT_AMBULATORY_CARE_PROVIDER_SITE_OTHER): Payer: PPO | Admitting: Oncology

## 2021-07-25 ENCOUNTER — Other Ambulatory Visit: Payer: Self-pay | Admitting: Hematology and Oncology

## 2021-07-25 ENCOUNTER — Telehealth: Payer: Self-pay | Admitting: Oncology

## 2021-07-25 ENCOUNTER — Inpatient Hospital Stay: Payer: PPO | Attending: Oncology

## 2021-07-25 DIAGNOSIS — F419 Anxiety disorder, unspecified: Secondary | ICD-10-CM | POA: Insufficient documentation

## 2021-07-25 DIAGNOSIS — G4733 Obstructive sleep apnea (adult) (pediatric): Secondary | ICD-10-CM | POA: Diagnosis not present

## 2021-07-25 DIAGNOSIS — M79621 Pain in right upper arm: Secondary | ICD-10-CM | POA: Insufficient documentation

## 2021-07-25 DIAGNOSIS — Z803 Family history of malignant neoplasm of breast: Secondary | ICD-10-CM

## 2021-07-25 DIAGNOSIS — Z8249 Family history of ischemic heart disease and other diseases of the circulatory system: Secondary | ICD-10-CM

## 2021-07-25 DIAGNOSIS — R160 Hepatomegaly, not elsewhere classified: Secondary | ICD-10-CM | POA: Diagnosis not present

## 2021-07-25 DIAGNOSIS — R630 Anorexia: Secondary | ICD-10-CM | POA: Insufficient documentation

## 2021-07-25 DIAGNOSIS — R531 Weakness: Secondary | ICD-10-CM | POA: Insufficient documentation

## 2021-07-25 DIAGNOSIS — R059 Cough, unspecified: Secondary | ICD-10-CM

## 2021-07-25 DIAGNOSIS — Z888 Allergy status to other drugs, medicaments and biological substances status: Secondary | ICD-10-CM | POA: Insufficient documentation

## 2021-07-25 DIAGNOSIS — I10 Essential (primary) hypertension: Secondary | ICD-10-CM | POA: Diagnosis not present

## 2021-07-25 DIAGNOSIS — J432 Centrilobular emphysema: Secondary | ICD-10-CM | POA: Diagnosis not present

## 2021-07-25 DIAGNOSIS — K219 Gastro-esophageal reflux disease without esophagitis: Secondary | ICD-10-CM | POA: Insufficient documentation

## 2021-07-25 DIAGNOSIS — Z66 Do not resuscitate: Secondary | ICD-10-CM | POA: Insufficient documentation

## 2021-07-25 DIAGNOSIS — R590 Localized enlarged lymph nodes: Secondary | ICD-10-CM | POA: Insufficient documentation

## 2021-07-25 DIAGNOSIS — R634 Abnormal weight loss: Secondary | ICD-10-CM | POA: Insufficient documentation

## 2021-07-25 DIAGNOSIS — C3490 Malignant neoplasm of unspecified part of unspecified bronchus or lung: Secondary | ICD-10-CM | POA: Diagnosis not present

## 2021-07-25 DIAGNOSIS — R7989 Other specified abnormal findings of blood chemistry: Secondary | ICD-10-CM | POA: Diagnosis not present

## 2021-07-25 DIAGNOSIS — R110 Nausea: Secondary | ICD-10-CM | POA: Diagnosis not present

## 2021-07-25 DIAGNOSIS — C3481 Malignant neoplasm of overlapping sites of right bronchus and lung: Secondary | ICD-10-CM | POA: Insufficient documentation

## 2021-07-25 DIAGNOSIS — R682 Dry mouth, unspecified: Secondary | ICD-10-CM | POA: Insufficient documentation

## 2021-07-25 DIAGNOSIS — F32A Depression, unspecified: Secondary | ICD-10-CM | POA: Insufficient documentation

## 2021-07-25 DIAGNOSIS — R269 Unspecified abnormalities of gait and mobility: Secondary | ICD-10-CM | POA: Insufficient documentation

## 2021-07-25 DIAGNOSIS — Z880 Allergy status to penicillin: Secondary | ICD-10-CM | POA: Insufficient documentation

## 2021-07-25 DIAGNOSIS — J90 Pleural effusion, not elsewhere classified: Secondary | ICD-10-CM | POA: Diagnosis not present

## 2021-07-25 DIAGNOSIS — H9201 Otalgia, right ear: Secondary | ICD-10-CM | POA: Diagnosis not present

## 2021-07-25 DIAGNOSIS — R5383 Other fatigue: Secondary | ICD-10-CM | POA: Diagnosis not present

## 2021-07-25 DIAGNOSIS — Z923 Personal history of irradiation: Secondary | ICD-10-CM | POA: Insufficient documentation

## 2021-07-25 DIAGNOSIS — D649 Anemia, unspecified: Secondary | ICD-10-CM | POA: Insufficient documentation

## 2021-07-25 DIAGNOSIS — D539 Nutritional anemia, unspecified: Secondary | ICD-10-CM

## 2021-07-25 DIAGNOSIS — I252 Old myocardial infarction: Secondary | ICD-10-CM | POA: Insufficient documentation

## 2021-07-25 DIAGNOSIS — Z8546 Personal history of malignant neoplasm of prostate: Secondary | ICD-10-CM | POA: Insufficient documentation

## 2021-07-25 DIAGNOSIS — Z79899 Other long term (current) drug therapy: Secondary | ICD-10-CM | POA: Insufficient documentation

## 2021-07-25 DIAGNOSIS — Z882 Allergy status to sulfonamides status: Secondary | ICD-10-CM | POA: Insufficient documentation

## 2021-07-25 DIAGNOSIS — R2689 Other abnormalities of gait and mobility: Secondary | ICD-10-CM | POA: Diagnosis not present

## 2021-07-25 DIAGNOSIS — M47816 Spondylosis without myelopathy or radiculopathy, lumbar region: Secondary | ICD-10-CM | POA: Insufficient documentation

## 2021-07-25 DIAGNOSIS — I700 Atherosclerosis of aorta: Secondary | ICD-10-CM | POA: Insufficient documentation

## 2021-07-25 DIAGNOSIS — Z993 Dependence on wheelchair: Secondary | ICD-10-CM

## 2021-07-25 DIAGNOSIS — Z809 Family history of malignant neoplasm, unspecified: Secondary | ICD-10-CM | POA: Diagnosis not present

## 2021-07-25 DIAGNOSIS — Z7952 Long term (current) use of systemic steroids: Secondary | ICD-10-CM | POA: Insufficient documentation

## 2021-07-25 DIAGNOSIS — N281 Cyst of kidney, acquired: Secondary | ICD-10-CM | POA: Insufficient documentation

## 2021-07-25 LAB — BASIC METABOLIC PANEL
BUN: 31 — AB (ref 4–21)
CO2: 24 — AB (ref 13–22)
Chloride: 99 (ref 99–108)
Creatinine: 1.1 (ref 0.6–1.3)
Glucose: 132
Potassium: 4.6 (ref 3.4–5.3)
Sodium: 134 — AB (ref 137–147)

## 2021-07-25 LAB — CBC AND DIFFERENTIAL
HCT: 34 — AB (ref 41–53)
Hemoglobin: 10.9 — AB (ref 13.5–17.5)
Neutrophils Absolute: 10.25
Platelets: 502 — AB (ref 150–399)
WBC: 12.2

## 2021-07-25 LAB — FERRITIN: Ferritin: 1792 ng/mL — ABNORMAL HIGH (ref 24–336)

## 2021-07-25 LAB — HEPATIC FUNCTION PANEL
ALT: 49 — AB (ref 10–40)
AST: 36 (ref 14–40)
Alkaline Phosphatase: 241 — AB (ref 25–125)
Bilirubin, Total: 0.6

## 2021-07-25 LAB — IRON AND TIBC
Iron: 23 ug/dL — ABNORMAL LOW (ref 45–182)
Saturation Ratios: 14 % — ABNORMAL LOW (ref 17.9–39.5)
TIBC: 162 ug/dL — ABNORMAL LOW (ref 250–450)
UIBC: 139 ug/dL

## 2021-07-25 LAB — FOLATE: Folate: 12.9 ng/mL (ref >5.9)

## 2021-07-25 LAB — VITAMIN B12: Vitamin B-12: 207 pg/mL (ref 180–914)

## 2021-07-25 LAB — COMPREHENSIVE METABOLIC PANEL
Albumin: 3.4 — AB (ref 3.5–5.0)
Calcium: 9.4 (ref 8.7–10.7)

## 2021-07-25 LAB — PROTIME-INR
INR: 1.3 — ABNORMAL HIGH (ref 0.8–1.2)
Prothrombin Time: 16.4 seconds — ABNORMAL HIGH (ref 11.4–15.2)

## 2021-07-25 LAB — CBC
MCV: 82 (ref 80–94)
RBC: 4.18 (ref 3.87–5.11)

## 2021-07-25 LAB — CORRECTED CALCIUM (CC13): Calcium, Corrected: 10

## 2021-07-25 LAB — APTT: aPTT: 37 seconds — ABNORMAL HIGH (ref 24–36)

## 2021-07-25 MED ORDER — PREDNISONE 10 MG PO TABS: 10.0000 mg | ORAL_TABLET | Freq: Two times a day (BID) | ORAL | 3 refills | Status: AC

## 2021-07-25 NOTE — Telephone Encounter (Signed)
Per 9/13 LOS, please cancel tomorrow's Appt w/Dr Julien Nordmann - Dr Hinton Rao saw patient in Clinic

## 2021-07-26 ENCOUNTER — Ambulatory Visit: Payer: PPO | Admitting: Internal Medicine

## 2021-07-26 ENCOUNTER — Telehealth: Payer: Self-pay

## 2021-07-26 ENCOUNTER — Other Ambulatory Visit: Payer: PPO

## 2021-07-26 DIAGNOSIS — C3491 Malignant neoplasm of unspecified part of right bronchus or lung: Secondary | ICD-10-CM | POA: Diagnosis not present

## 2021-07-26 DIAGNOSIS — F4321 Adjustment disorder with depressed mood: Secondary | ICD-10-CM | POA: Diagnosis not present

## 2021-07-26 LAB — CEA: CEA: 1.1 ng/mL (ref 0.0–4.7)

## 2021-07-26 NOTE — Telephone Encounter (Addendum)
Pt has appt for 08/09/21.   08/03/21 @ 1152- Pt is scheduled for biopsy today.  07/26/21 - I spoke with pt's wife and pt could also here me on speaker phone. I notified them of Dr Remi Deter response below. He is scheduled to have the biopsy tomorrow in [place]. They wanted to know when Dr Hinton Rao would see him again. I told her that I would send message to Dr Hinton Rao, as no pending appt's @ this time yet.  ----- Message from Derwood Kaplan, MD sent at 07/26/2021 10:26 AM EDT ----- Regarding: RE: Anthony Hartman FOR LAXATIVE I rec Miralax or Senokot, they are OTC.  Tell them I spoke with radiologist and will need to have bx done in [place] so they can sedate him since so he has so much pain, also because it's the lung ----- Message ----- From: Dairl Ponder, RN Sent: 07/26/2021   9:12 AM EDT To: Derwood Kaplan, MD Subject: [place]                               Received message from pt's wife that the pharmacy hasn't received the laxative script you were going to send in.

## 2021-07-27 ENCOUNTER — Other Ambulatory Visit: Payer: PPO

## 2021-07-27 ENCOUNTER — Telehealth: Payer: Self-pay

## 2021-07-27 ENCOUNTER — Other Ambulatory Visit: Payer: Self-pay | Admitting: Oncology

## 2021-07-27 ENCOUNTER — Encounter (HOSPITAL_COMMUNITY): Payer: Self-pay | Admitting: Radiology

## 2021-07-27 ENCOUNTER — Ambulatory Visit: Payer: PPO | Admitting: Internal Medicine

## 2021-07-27 DIAGNOSIS — R300 Dysuria: Secondary | ICD-10-CM | POA: Diagnosis not present

## 2021-07-27 DIAGNOSIS — C3481 Malignant neoplasm of overlapping sites of right bronchus and lung: Secondary | ICD-10-CM

## 2021-07-27 NOTE — Progress Notes (Signed)
Patient Name  Anthony Hartman, Anthony Hartman Legal Sex  Male DOB  [birth year] SSN  [id number] Address  [place] [address] Phone  [phone] (Home)  [phone] (Mobile) *Preferred*    RE: CT LUNG MASS BIOPSY Received: Today Arne Cleveland, MD  Derwood Kaplan, MD; Arlyn Leak. We will proceed then.   @Elster Corbello :  Ok to  schedule for  CT core biopsy R chest wall/pleural mass   Thx  DDH        Previous Messages   ----- Message -----  From: Derwood Kaplan, MD  Sent: 07/27/2021  12:56 PM EDT  To: Arne Cleveland, MD  Subject: RE: CT LUNG MASS BIOPSY                         We already did that on 9/12 and spoke with pathologist, very few cells, will not be able to give dx  ----- Message -----  From: Arne Cleveland, MD  Sent: 07/27/2021  12:14 PM EDT  To: Georgiana Shore, MD  Subject: FW: CT LUNG MASS BIOPSY                         Given the extensive pleural disease, Shall we start with US guided Thoracentesis for cytology, see if that is diagnostic?   Thx  Daniel    ----- Message -----  From: Garth Bigness D  Sent: 07/27/2021  11:53 AM EDT  To: Ir Procedure Requests  Subject: CT LUNG MASS BIOPSY                             Procedure:   CT LUNG MASS BIOPSY   Reason:  Malignant neoplasm of overlapping sites of right lung, lung mass   History:  CT in computer   IMPRESSION:  1. Extensive, circumferential rind of tumor encases the entire right  lung. There are signs of chest wall invasion as well as invasion  through the right hemidiaphragm. Loculated right pleural fluid  collection is present. Primary differential considerations include  primary bronchogenic carcinoma with extensive pleural metastasis  versus mesothelioma. Further investigation with PET-CT and tissue  sampling advised.  2. Enlarged right hilar, right CP angle and right paratracheal lymph  nodes compatible with metastatic adenopathy.  3. No  evidence for solid organ metastasis or nodal metastasis within  the abdomen or pelvis.  4. Emphysema and aortic atherosclerosis.     Aortic Atherosclerosis (ICD10-I70.0) and Emphysema (ICD10-J43.9).        Electronically Signed    By: Kerby Moors M.D.    On: 07/20/2021 15:18     Provider:  Derwood Kaplan   Provider Contact:  [phone]

## 2021-07-27 NOTE — Telephone Encounter (Signed)
-----   Message from Derwood Kaplan, MD sent at 07/27/2021 11:23 AM EDT ----- Regarding: call Pls call pt/wife & let them know only a few cells in the pleural fluid, not enough to make diagnosis. So we do need to schedule lung bx at [facility] with the [facility] radiologists.  I explained how much pain he has on that right side so they will plan to use sedation to help him through it I just told Pamala Hurry and she will call them with information once it is scheduled.  I will plan to see him a few days after the bx to go over results

## 2021-07-27 NOTE — Telephone Encounter (Signed)
Patient and wife notified.

## 2021-07-31 ENCOUNTER — Telehealth: Payer: Self-pay

## 2021-07-31 ENCOUNTER — Other Ambulatory Visit: Payer: Self-pay | Admitting: Oncology

## 2021-07-31 DIAGNOSIS — G4733 Obstructive sleep apnea (adult) (pediatric): Secondary | ICD-10-CM | POA: Diagnosis not present

## 2021-07-31 DIAGNOSIS — C3481 Malignant neoplasm of overlapping sites of right bronchus and lung: Secondary | ICD-10-CM

## 2021-07-31 NOTE — Telephone Encounter (Signed)
-----   Message from Belva Chimes, LPN sent at 3/78/5885  1:52 PM EDT ----- Regarding: FW: call pt  ----- Message ----- From: Derwood Kaplan, MD Sent: 07/31/2021  12:59 PM EDT To: Belva Chimes, LPN Subject: call pt                                        Tell him/wife the B12 is low normal, I rec he take 500 mcg po daily and we'll recheck.  If not better, will consider injections.  Iron studies unclear.  Have they scheduled his bx yet?

## 2021-07-31 NOTE — Telephone Encounter (Signed)
Spoke with patient and wife , made them aware of labs. Patient will start on B12 lozenges 3000mg  once a day okayed per Dr Hinton Rao they already have this dose at home. Bx is scheduled for this Thursday and Dr Hinton Rao will schedule sooner follow-up appointment for patient they are aware someone will call and schedule this appointment

## 2021-07-31 NOTE — Telephone Encounter (Signed)
error 

## 2021-08-01 ENCOUNTER — Other Ambulatory Visit: Payer: Self-pay | Admitting: Radiology

## 2021-08-03 ENCOUNTER — Other Ambulatory Visit: Payer: Self-pay

## 2021-08-03 ENCOUNTER — Encounter (HOSPITAL_COMMUNITY): Payer: Self-pay

## 2021-08-03 ENCOUNTER — Ambulatory Visit (HOSPITAL_COMMUNITY)
Admission: RE | Admit: 2021-08-03 | Discharge: 2021-08-03 | Disposition: A | Discharge status: Home / Self Care | Payer: PPO | Source: Ambulatory Visit | Attending: Oncology | Admitting: Oncology

## 2021-08-03 DIAGNOSIS — I10 Essential (primary) hypertension: Secondary | ICD-10-CM | POA: Diagnosis not present

## 2021-08-03 DIAGNOSIS — C801 Malignant (primary) neoplasm, unspecified: Secondary | ICD-10-CM | POA: Diagnosis not present

## 2021-08-03 DIAGNOSIS — G4733 Obstructive sleep apnea (adult) (pediatric): Secondary | ICD-10-CM | POA: Diagnosis not present

## 2021-08-03 DIAGNOSIS — I252 Old myocardial infarction: Secondary | ICD-10-CM | POA: Insufficient documentation

## 2021-08-03 DIAGNOSIS — E785 Hyperlipidemia, unspecified: Secondary | ICD-10-CM | POA: Insufficient documentation

## 2021-08-03 DIAGNOSIS — C3481 Malignant neoplasm of overlapping sites of right bronchus and lung: Secondary | ICD-10-CM | POA: Insufficient documentation

## 2021-08-03 DIAGNOSIS — Z79899 Other long term (current) drug therapy: Secondary | ICD-10-CM | POA: Diagnosis not present

## 2021-08-03 DIAGNOSIS — Z7982 Long term (current) use of aspirin: Secondary | ICD-10-CM | POA: Diagnosis not present

## 2021-08-03 DIAGNOSIS — Z8551 Personal history of malignant neoplasm of bladder: Secondary | ICD-10-CM | POA: Diagnosis not present

## 2021-08-03 DIAGNOSIS — Z87891 Personal history of nicotine dependence: Secondary | ICD-10-CM | POA: Diagnosis not present

## 2021-08-03 DIAGNOSIS — Z8546 Personal history of malignant neoplasm of prostate: Secondary | ICD-10-CM | POA: Diagnosis not present

## 2021-08-03 DIAGNOSIS — Z8249 Family history of ischemic heart disease and other diseases of the circulatory system: Secondary | ICD-10-CM | POA: Insufficient documentation

## 2021-08-03 DIAGNOSIS — C782 Secondary malignant neoplasm of pleura: Secondary | ICD-10-CM | POA: Diagnosis not present

## 2021-08-03 DIAGNOSIS — N400 Enlarged prostate without lower urinary tract symptoms: Secondary | ICD-10-CM | POA: Insufficient documentation

## 2021-08-03 DIAGNOSIS — K219 Gastro-esophageal reflux disease without esophagitis: Secondary | ICD-10-CM | POA: Insufficient documentation

## 2021-08-03 DIAGNOSIS — J439 Emphysema, unspecified: Secondary | ICD-10-CM | POA: Diagnosis not present

## 2021-08-03 DIAGNOSIS — R918 Other nonspecific abnormal finding of lung field: Secondary | ICD-10-CM | POA: Diagnosis not present

## 2021-08-03 DIAGNOSIS — Z955 Presence of coronary angioplasty implant and graft: Secondary | ICD-10-CM | POA: Insufficient documentation

## 2021-08-03 DIAGNOSIS — C384 Malignant neoplasm of pleura: Secondary | ICD-10-CM | POA: Diagnosis not present

## 2021-08-03 LAB — PROTIME-INR
INR: 1.2 (ref 0.8–1.2)
Prothrombin Time: 15 seconds (ref 11.4–15.2)

## 2021-08-03 LAB — CBC
HCT: 36.8 % — ABNORMAL LOW (ref 39.0–52.0)
Hemoglobin: 11.5 g/dL — ABNORMAL LOW (ref 13.0–17.0)
MCH: 26.8 pg (ref 26.0–34.0)
MCHC: 31.3 g/dL (ref 30.0–36.0)
MCV: 85.8 fL (ref 80.0–100.0)
Platelets: 567 10*3/uL — ABNORMAL HIGH (ref 150–400)
RBC: 4.29 MIL/uL (ref 4.22–5.81)
RDW: 17 % — ABNORMAL HIGH (ref 11.5–15.5)
WBC: 15.4 10*3/uL — ABNORMAL HIGH (ref 4.0–10.5)
nRBC: 0 % (ref 0.0–0.2)

## 2021-08-03 MED ORDER — MIDAZOLAM HCL 2 MG/2ML IJ SOLN
INTRAMUSCULAR | Status: AC
  Filled 2021-08-03: qty 2

## 2021-08-03 MED ORDER — MIDAZOLAM HCL 2 MG/2ML IJ SOLN
INTRAMUSCULAR | Status: DC | PRN
  Administered 2021-08-03: 1 mg via INTRAVENOUS
  Administered 2021-08-03: .5 mg via INTRAVENOUS

## 2021-08-03 MED ORDER — LIDOCAINE-EPINEPHRINE 1 %-1:100000 IJ SOLN
INTRAMUSCULAR | Status: AC
  Filled 2021-08-03: qty 1

## 2021-08-03 MED ORDER — SODIUM CHLORIDE 0.9 % IV SOLN: INTRAVENOUS | Status: DC

## 2021-08-03 MED ORDER — FENTANYL CITRATE (PF) 100 MCG/2ML IJ SOLN
INTRAMUSCULAR | Status: DC | PRN
  Administered 2021-08-03 (×2): 25 ug via INTRAVENOUS

## 2021-08-03 MED ORDER — FENTANYL CITRATE (PF) 100 MCG/2ML IJ SOLN
INTRAMUSCULAR | Status: AC
  Filled 2021-08-03: qty 2

## 2021-08-03 NOTE — Procedures (Signed)
Pre procedural Dx: Right pleural thickening Post procedural Dx: Same  Technically successful [place] and CT guided biopsy of nodular pleural thickening within the right inferior lateral chest.   EBL: None.   Complications: None immediate.   Ronny Bacon, MD Pager #: [phone]

## 2021-08-03 NOTE — H&P (Signed)
Chief Complaint: Patient was seen in consultation today for right lung mass biopsy  at the request of [place]  Referring Physician(s): [place] [facility]  Supervising Physician: Sandi Mariscal  Patient Status: [facility] - Out-pt  History of Present Illness: Anthony Hartman is a 80 y.o. male with PMH of bladder cancer, BPH, GERD, MI with coronary stent intervention x2, HLD, HTN, OSA, prostate cancer.  Patient had CT chest scan 07/20/2021 after complaining of unintentional weight loss and pain in the right axilla.  CT scan resulted with invasive tumor in the right lung concerning for carcinoma.  Patient was sent here today for right lung mass biopsy by Dr. Hinton Rao.  Dr. Vernard Gambles, IR approved procedure.  CT chest 07/20/2021: IMPRESSION: 1. Extensive, circumferential rind of tumor encases the entire right lung. There are signs of chest wall invasion as well as invasion through the right hemidiaphragm. Loculated right pleural fluid collection is present. Primary differential considerations include primary bronchogenic carcinoma with extensive pleural metastasis versus mesothelioma. Further investigation with PET-CT and tissue sampling advised. 2. Enlarged right hilar, right CP angle and right paratracheal lymph nodes compatible with metastatic adenopathy. 3. No evidence for solid organ metastasis or nodal metastasis within the abdomen or pelvis. 4. Emphysema and aortic atherosclerosis.  Past Medical History:  Diagnosis Date   Anxiety    Arthritis    Bladder cancer ([place])    Bladder stone    BPH (benign prostatic hypertrophy)    Depression    GERD (gastroesophageal reflux disease)    History of kidney stones    History of myocardial infarction    07-17-2011--   MI W/ OCCLUDED OM1. 2017 occluded RCA treated with a bare-metal stent, high-grade ramus intermediate stenosis in 2018 with failed attempted PCI   Hyperlipidemia    Hypertension    Myocardial infarction [facility]) 2012; 2017    2012 - noted [place] CTO of OM/RI;  09/2016 - Acute Infer STEMI - BMS x 2 mRCA (3.5 mm x 15 & 3.5 x 12 - tapered 4.0 - 3.8 mm)   OSA (obstructive sleep apnea)    cpap non-compliant    Prostate cancer [facility])     Past Surgical History:  Procedure Laterality Date   AORTIC VALVE REPLACEMENT N/A 08/26/2020   Procedure: AORTIC VALVE REPLACEMENT (AVR) USING INSPIRIS VALVE SIZE 25MM;  Surgeon: Gaye Pollack, MD;  Location: [place];  Service: Open Heart Surgery;  Laterality: N/A;   ARTHRODESIS METATARSALPHALANGEAL JOINT (MTPJ) Right 02/20/2018   Procedure: Right Hallux Metatarsal Phalangeal Joint Arthrodesis; Right Silver Bunionectomy;  Surgeon: Wylene Simmer, MD;  Location: [place];  Service: Orthopedics;  Laterality: Right;   BACK SURGERY     x2 of the lumbar spine, x1 of the cervical spine   CARDIOVASCULAR STRESS TEST  04/22/2011   CORONARY ANGIOPLASTY  03/2017    failed PCI RI (OM1) 2018 -- similar to 2012.   CORONARY ARTERY BYPASS GRAFT N/A 08/26/2020   Procedure: CORONARY ARTERY BYPASS GRAFTING (CABG) TIMES FIVE, USING LEFT INTERNAL MAMMARY ARTERY AND RIGHT GREATER SAPHENOUS VEIN HARVESTED ENDOSCOPICALLY;  Surgeon: Gaye Pollack, MD;  Location: [place];  Service: Open Heart Surgery;  Laterality: N/A;   CORONARY STENT INTERVENTION  09/2016   [facility] (Acute Inferior STEMI)- Acute mid RCA 100% --> Integrity BMS 3.5 mm x 8m (3.8 mm) & 3.5 mm x 12 mm (4.1 mm) --BMS used because of need for back surgery   CORONARY STENT INTERVENTION N/A 05/01/2019   Procedure: CORONARY STENT  INTERVENTION;  Surgeon: Jettie Booze, MD;  Location: [place] [facility];  Service: Cardiovascular;  Laterality: N/A;   CYSTO/ BLADDER BX'S  X2  2001  / X1  2003   CYSTO/ BLADDER BX'S/ BILATERAL RETROGRADE URETEROPYLEGRAM  2003; 2007; 2008;  12-30-2007   CARCINOMA IN SITU OF BLADDER   CYSTOSCOPY WITH LITHOLAPAXY N/A 05/07/2013   Procedure: CYSTOSCOPY WITH LITHOLAPAXY;  Surgeon: Franchot Gallo, MD;  Location: [facility];  Service: Urology;  Laterality: N/A;   EHL Lengthening Right 05/10/2017   RT FOOT   Hammer Toe Repair Right 05/10/2017   Rt #2 Toe   HAMMERTOE RECONSTRUCTION WITH WEIL OSTEOTOMY Right 02/20/2018   Procedure: Right Second Metatarsal Priscille Heidelberg and Revision Hammertoe Correction;  Surgeon: Wylene Simmer, MD;  Location: [place];  Service: Orthopedics;  Laterality: Right;   INTRAVASCULAR PRESSURE WIRE/FFR STUDY N/A 08/23/2020   Procedure: INTRAVASCULAR PRESSURE WIRE/FFR STUDY;  Surgeon: Belva Crome, MD;  Location: [place] [facility];  Service: Cardiovascular;  Laterality: N/A;   LEFT HEART CATH AND CORONARY ANGIOGRAPHY  07/17/2011   OCCLUDED OM1 WITH UNSUCCESSFUL PCI ATTEMPT, O/W MILD TO MODERATE DISEASE, DR ANDY CHIU ([facility])     LEFT HEART CATH AND CORONARY ANGIOGRAPHY  09/2016   Acute Inferior STEMI ([facility])  - Mid LAD 30%, D2 50%.  OM 1/RI 100%, mid circumflex 30%.  Acute mRCA 100% (thrombotic) - BMS PCI.  EF 50% with inferobasal hypokinesis.   LEFT HEART CATH AND CORONARY ANGIOGRAPHY  03/2017   (Dr. Chuck Hint - [place] [facility]): OM(RI) ~ CTO (unfixable PCI unable to cross with wire/balloon. pLAD 40%, ost D1 40%;  prox-mid RCA 15%.  Mild stenosis with small mid LCx.  EF 60%.   LEFT HEART CATH AND CORONARY ANGIOGRAPHY N/A 05/01/2019   Procedure: LEFT HEART CATH AND CORONARY ANGIOGRAPHY;  Surgeon: Jettie Booze, MD;  Location: [place] [facility];  Service: Cardiovascular;  Laterality: N/A;   LUMBAR DECOMPRESSION FORAMINOTOMY L3 -- L5/  REMOVAL CYST L4-5 FACET JOINT  08/21/2009   ALSO HAD PRIOR BACK SURG IN 1992   LUMBAR LAMINECTOMY  04/04/2017   L2-3   LUMBAR LAMINECTOMY/DECOMPRESSION MICRODISCECTOMY N/A 04/04/2017   Procedure: Laminectomy and Foraminotomy - Lumbar Two-Three;  Surgeon: Eustace Moore, MD;  Location: [place];  Service: Neurosurgery;  Laterality: N/A;   OPEN TENOTOMY OF FLEXOR 2ND AND 3RD RIGHT TOES   02/2007   PROSTATE BIOPSY     RIGHT/LEFT HEART CATH AND CORONARY ANGIOGRAPHY N/A 08/23/2020   Procedure: RIGHT/LEFT HEART CATH AND CORONARY ANGIOGRAPHY;  Surgeon: Belva Crome, MD;  Location: [place] [facility];  Service: Cardiovascular;  Laterality: N/A;   TEE WITHOUT CARDIOVERSION N/A 08/26/2020   Procedure: TRANSESOPHAGEAL ECHOCARDIOGRAM (TEE);  Surgeon: Gaye Pollack, MD;  Location: [place];  Service: Open Heart Surgery;  Laterality: N/A;   TRANSTHORACIC ECHOCARDIOGRAM  07/17/2011   at [facility], MILD TO MODERATE CONCENTRIC LVH/ NORMAL LVSF/ EF 55-60%/ MILD AORTIC STENOSIS   TRANSTHORACIC ECHOCARDIOGRAM  06/2018   ([place]): Normal LV function/thickness.  EF 50 to 55%.  Normal wall motion.  Mild to moderate MAC.  Moderate to Severe Aortic Stenosis (calcified).  Mild aortic insufficiency.   TRANSURETHRAL RESECTION OF BLADDER TUMOR  01/10/2004   TRANSURETHRAL RESECTION OF PROSTATE N/A 05/07/2013   Procedure: TRANSURETHRAL RESECTION OF THE PROSTATE WITH GYRUS INSTRUMENTS;  Surgeon: Franchot Gallo, MD;  [facility];  Service: Urology;  Laterality: N/A;    Allergies: Amoxicillin-pot  clavulanate, Statins, Sulfa antibiotics, and Dexamethasone  Medications: Prior to Admission medications   Medication Sig Start Date End Date Taking? Authorizing Provider  acetaminophen (TYLENOL) 500 MG tablet Take 1,000 mg by mouth every 6 (six) hours as needed for moderate pain or headache (pain.).   Yes [provider]  ALPRAZolam Duanne Moron) 1 MG tablet Take 0.5 mg by mouth at bedtime. 02/28/19  Yes [provider]  aspirin EC 81 MG tablet Take 81 mg by mouth daily. Swallow whole.   Yes [provider]  buPROPion (WELLBUTRIN XL) 300 MG 24 hr tablet Take 300 mg by mouth daily.   Yes [provider]  carvedilol (COREG) 6.25 MG tablet TAKE 1 TABLET BY MOUTH TWICE (2) DAILY 02/03/21  Yes Minus Breeding, MD  escitalopram (LEXAPRO) 10 MG tablet Take 10 mg by mouth  daily.   Yes [provider]  Evolocumab (REPATHA SURECLICK) 025 MG/ML SOAJ Inject 140 mg into the skin every 14 (fourteen) days. 12/12/20  Yes Minus Breeding, MD  HYDROcodone-acetaminophen (NORCO/VICODIN) 5-325 MG tablet Take 0.5-1 tablets by mouth every 6 (six) hours as needed for moderate pain.   Yes [provider]  loratadine (CLARITIN) 10 MG tablet Take 10 mg by mouth daily as needed for allergies.   Yes [provider]  Multiple Vitamin (MULTIVITAMIN WITH MINERALS) TABS tablet Take 1 tablet by mouth daily.   Yes [provider]  omeprazole (PRILOSEC) 40 MG capsule Take 40 mg by mouth daily. 06/14/21  Yes [provider]  oxyCODONE (OXY IR/ROXICODONE) 5 MG immediate release tablet Take 5 mg by mouth 3 (three) times daily as needed for severe pain.   Yes [provider]  polyethylene glycol (MIRALAX / GLYCOLAX) 17 g packet Take 17 g by mouth daily as needed for moderate constipation.   Yes [provider]  predniSONE (DELTASONE) 10 MG tablet Take 1 tablet (10 mg total) by mouth 2 (two) times daily with a meal. Patient taking differently: Take 20 mg by mouth daily. 07/25/21  Yes Derwood Kaplan, MD  senna (SENOKOT) 8.6 MG tablet Take 1-2 tablets by mouth daily as needed for constipation. 07/26/21  Yes Derwood Kaplan, MD  tamsulosin (FLOMAX) 0.4 MG CAPS capsule Take 0.4 mg by mouth daily. 07/25/21  Yes [provider]  meloxicam (MOBIC) 7.5 MG tablet Take 1 tablet (7.5 mg total) by mouth daily as needed for pain. Patient not taking: No sig reported 05/02/20   Minus Breeding, MD     Family History  Problem Relation Age of Onset   Cancer Mother        breast   Heart failure Mother    Cancer Father        lung, 22s    Social History   Socioeconomic History   Marital status: Married    Spouse name: Hassan Rowan   Number of children: 2   Years of education: Not on file   Highest education level: 11th grade   Occupational History   Occupation: Retired   Occupation: Administrator    Comment: Retired  Tobacco Use   Smoking status: Former    Packs/day: 0.50    Years: 50.00    Pack years: 25.00    Types: Cigarettes    Quit date: 04/15/2010    Years since quitting: 11.3   Smokeless tobacco: Current    Types: Snuff   Tobacco comments:    occasional  snuff pack  Vaping Use   Vaping Use: Never used  Substance and Sexual Activity   Alcohol use: Yes    Alcohol/week: 2.0 standard drinks    Types: 2 Cans of beer per week    Comment: social   Drug use: No   Sexual activity: Yes  Other Topics Concern   Not on file  Social History Narrative   he prefers Dr. Percival Spanish to going back to the cardiologist at [facility] since Dr. Wyline Copas is no longer there.   Social Determinants of Health   Financial Resource Strain: Not on file  Food Insecurity: No Food Insecurity   Worried About Charity fundraiser in the Last Year: Never true   Ran Out of Food in the Last Year: Never true  Transportation Needs: No Transportation Needs   Lack of Transportation (Medical): No   Lack of Transportation (Non-Medical): No  Physical Activity: Not on file  Stress: Not on file  Social Connections: Not on file    Review of Systems: A 12 point ROS discussed and pertinent positives are indicated in the HPI above.  All other systems are negative.  Review of Systems  Constitutional:  Positive for appetite change, fatigue and unexpected weight change. Negative for chills and fever.       Patient reports decrease in appetite, fatigue and loss of greater than 50 pounds  Respiratory:  Positive for chest tightness. Negative for cough and shortness of breath.        Chest tightness coming from right axilla pain from lung mass.  Cardiovascular:  Positive for chest pain. Negative for leg swelling.       Chest pain on the right side from from right lung mass.  Gastrointestinal:  Positive for diarrhea. Negative for abdominal pain,  blood in stool, nausea and vomiting.       Patient reports he was given medication for constipation that caused diarrhea.  Neurological:  Positive for dizziness and weakness.   Vital Signs: BP (!) 113/53   Pulse 77   Temp 98 F (36.7 C) (Oral)   Resp 15   Ht _0  (1.93 m)   Wt 198 lb (89.8 kg)   SpO2 96%   BMI 24.10 kg/m   Physical Exam Vitals reviewed.  Constitutional:      Appearance: He is ill-appearing.  HENT:     Head: Normocephalic and atraumatic.     Mouth/Throat:     Mouth: Mucous membranes are dry.     Pharynx: Oropharynx is clear.  Cardiovascular:     Rate and Rhythm: Normal rate and regular rhythm.     Pulses: Normal pulses.     Heart sounds: No murmur heard.   No gallop.  Pulmonary:     Effort: Pulmonary effort is normal. No respiratory distress.     Breath sounds: Normal breath sounds. No stridor. No wheezing, rhonchi or rales.  Abdominal:     General: Bowel sounds are normal. There is no distension.     Palpations: Abdomen is soft.     Tenderness: There is no abdominal tenderness. There is no guarding.  Musculoskeletal:     Right lower leg: No edema.     Left lower leg: No edema.  Skin:    General: Skin is warm and dry.  Neurological:     Mental Status: He is alert and oriented to person, place, and time. Mental status is at baseline.  Psychiatric:        Mood and Affect: Mood normal.        Behavior: Behavior normal.  Thought Content: Thought content normal.        Judgment: Judgment normal.    Imaging: DG Chest 2 View  Result Date: 07/20/2021 CLINICAL DATA:  right sided chest pain and arm pain EXAM: CHEST - 2 VIEW COMPARISON:  Multiple priors FINDINGS: The cardiomediastinal silhouette is within normal limits. Surgical changes of median sternotomy and heart valve replacement. Small to moderate right pleural effusion. No pneumothorax. Right lung base opacity. IMPRESSION: Small to moderate right pleural effusion with likely partial atelectasis  of the right lower lobe. These findings are new/worsened since January 2022. Chest CT can be performed for further evaluation. Electronically Signed   By: Albin Felling M.D.   On: 07/20/2021 12:01   CT CHEST ABDOMEN PELVIS W CONTRAST  Result Date: 07/20/2021 CLINICAL DATA:  Unintentional weight loss.  Pain in right axilla. EXAM: CT CHEST, ABDOMEN, AND PELVIS WITH CONTRAST TECHNIQUE: Multidetector CT imaging of the chest, abdomen and pelvis was performed following the standard protocol during bolus administration of intravenous contrast. CONTRAST:  57m OMNIPAQUE IOHEXOL 350 MG/ML SOLN COMPARISON:  06/27/2021 FINDINGS: CT CHEST FINDINGS Cardiovascular: The heart size appears within normal limits. No pericardial effusion. Mediastinum/Nodes: High right paratracheal lymph node measures 1.6 cm, image 20/3. Lower right paratracheal lymph node measures 1.7 cm, image 28/3. Right hilar lymph node is enlarged measuring 2.4 cm, image 30/3. Multiple right CP angle lymph nodes are identified measuring up to 1.6 cm, image 52/3. Normal appearance of the thyroid gland. The trachea appears patent and is midline. Normal appearance of the esophagus Lungs/Pleura: Thick, irregular rind of tumor encases the entire right lung with loculated pleural fluid overlying the right lower lobe. Sample measurements of the tumor rind include: -at the level of the posterior right apex this measures 1.2 cm, image 14/3. -At the level of the right mid lung this measures 2.2 cm laterally, image 25/3. -Within the deep right lateral costophrenic sulcus adjacent to the right hepatic lobe this measures 2.5 cm in thickness, image 68/3. Tumor involvement of the right lateral and posterior hemidiaphragm is suspected, image 79/6 and image 29/7. Moderate changes of centrilobular and paraseptal emphysema. Several calcified nodules are noted overlying the posterior left lower lobe. No definite evidence for tumor involvement of the left lung. Musculoskeletal:  There are signs of right lateral chest wall involvement by pleural disease. Most notable is at the level of the mid right upper lobe where pleural tumor invades the chest wall and there are associated erosive changes involving the lateral aspect of the righta fourth rib, image 25/3. CT ABDOMEN PELVIS FINDINGS Hepatobiliary: Pleural tumor along the deep right costophrenic sulcus has mass effect upon the lateral aspect of the right hepatic lobe, image 69/3. No convincing evidence for tumor metastasis to the liver. The gallbladder appears normal. Pancreas: Unremarkable. No pancreatic ductal dilatation or surrounding inflammatory changes. Spleen: Normal in size without focal abnormality. Adrenals/Urinary Tract: Normal appearance of the adrenal glands. Bilateral kidney cysts. The largest arises off the lateral cortex of the left mid kidney measuring 3.6 cm. Urinary bladder is unremarkable. Stomach/Bowel: Stomach is nondistended. No scratch set the appendix is visualized and appears normal. No bowel wall thickening, inflammation, or distension. Vascular/Lymphatic: Aortic atherosclerosis without aneurysm. No adenopathy identified within the abdomen or pelvis. Reproductive: Prostate gland appears atrophic. Other: No ascites or focal fluid collections. Musculoskeletal: Spondylosis noted within the lumbar spine. IMPRESSION: 1. Extensive, circumferential rind of tumor encases the entire right lung. There are signs of chest wall invasion as well as invasion through  the right hemidiaphragm. Loculated right pleural fluid collection is present. Primary differential considerations include primary bronchogenic carcinoma with extensive pleural metastasis versus mesothelioma. Further investigation with PET-CT and tissue sampling advised. 2. Enlarged right hilar, right CP angle and right paratracheal lymph nodes compatible with metastatic adenopathy. 3. No evidence for solid organ metastasis or nodal metastasis within the abdomen or  pelvis. 4. Emphysema and aortic atherosclerosis. Aortic Atherosclerosis (ICD10-I70.0) and Emphysema (ICD10-J43.9). Electronically Signed   By: Kerby Moors M.D.   On: 07/20/2021 15:18    Labs:  CBC: Recent Labs    08/28/20 0342 08/29/20 0115 07/20/21 1108 07/25/21 0000  WBC 10.0 9.2 11.9* 12.2  HGB 13.8 13.3 11.4* 10.9*  HCT 42.3 39.6 37.3* 34*  PLT 117* 125* 464* 502*    COAGS: Recent Labs    08/22/20 0542 08/26/20 1602 07/25/21 1010  INR 1.1 1.6* 1.3*  APTT 31 46* 37*    BMP: Recent Labs    08/28/20 0342 08/29/20 0115 08/31/20 0146 07/20/21 1108 07/25/21 0000  NA 134* 132* 135 132* 134*  K 4.2 4.3 4.1 4.7 4.6  CL 101 99 99 97* 99  CO2 _0 24*  GLUCOSE 119* 154* 107* 88  --   BUN 19 23 24* 24* 31*  CALCIUM 8.7* 8.6* 9.0 9.2 9.4  CREATININE 1.31* 1.24 1.35* 0.99 1.1  GFRNONAA 51* 55* 50* >60  --     LIVER FUNCTION TESTS: Recent Labs    08/22/20 0542 07/20/21 1108 07/25/21 0000  BILITOT 0.9 0.7  --   AST 30 27 36  ALT 24 55* 49*  ALKPHOS 35* 228* 241*  PROT 5.5* 6.2*  --   ALBUMIN 3.6 2.5* 3.4*    TUMOR MARKERS: No results for input(s): AFPTM, CEA, CA199, CHROMGRNA in the last 8760 hours.  Assessment and Plan: History of bladder cancer, BPH, GERD, MI with coronary stent intervention x2, HLD, HTN, OSA, prostate cancer.  Patient had CT chest scan 07/20/2021 after complaining of unintentional weight loss and pain in the right axilla.  CT scan resulted with invasive tumor in the right lung concerning for carcinoma.  Patient was sent here today for right lung mass biopsy by Dr. Hinton Rao.  Dr. Vernard Gambles, IR approved procedure.  Patient lying in bed.  He complains of 7/10 right axilla pain that he took 1 oxycodone for prior to arrival this a.m. He is alert and oriented, calm and pleasant. Patient n.p.o. per order Today's ordered labs pending Patient in no apparent distress. VSS  Risks and benefits of right lung mass biopsy was discussed with the  patient and/or patient's family including, but not limited to bleeding, infection, damage to adjacent structures or low yield requiring additional tests.  All of the questions were answered and there is agreement to proceed.  Consent signed and in chart.   Thank you for this interesting consult.  I greatly enjoyed meeting CHANDLOR NOECKER and look forward to participating in their care.  A copy of this report was sent to the requesting provider on this date.  Electronically Signed: Tyson Alias, NP 08/03/2021, 9:31 AM   I spent a total of 30 minutes in face to face in clinical consultation, greater than 50% of which was counseling/coordinating care for right lung mass biopsy.

## 2021-08-07 ENCOUNTER — Telehealth: Payer: Self-pay

## 2021-08-07 LAB — SURGICAL PATHOLOGY

## 2021-08-07 NOTE — Telephone Encounter (Signed)
I called pt's wife to make her aware that the biopsy results are ont done as of now.     RE: Biopsy results, pt anxious for results Received: Today Derwood Kaplan, MD  Dairl Ponder, RN We still don't have result, they are probably doing special stains on it        Previous Messages   ----- Message -----  From: Dairl Ponder, RN  Sent: 08/07/2021  12:25 PM EDT  To: Derwood Kaplan, MD  Subject: Biopsy results, pt anxious for results         Pt's wife called to ask if we have biopsy results yet? His appointment is this Wednesday. He is just anxious and wondered if we knew anything yet?  [phone]

## 2021-08-07 NOTE — Telephone Encounter (Addendum)
I notified pt's wife of Dr Remi Deter response below. Marland Kitchen ----- Message from Derwood Kaplan, MD sent at 08/07/2021 12:42 PM EDT ----- Regarding: RE: Biopsy results, pt anxious for results We still don't have result, they are probably doing special stains on it ----- Message ----- From: Dairl Ponder, RN Sent: 08/07/2021  12:25 PM EDT To: Derwood Kaplan, MD Subject: Biopsy results, pt anxious for results         Pt's wife called to ask if we have biopsy results yet? His appointment is this Wednesday. He is just anxious and wondered if we knew anything yet?  [phone]

## 2021-08-08 ENCOUNTER — Telehealth: Payer: Self-pay | Admitting: Oncology

## 2021-08-08 NOTE — Progress Notes (Signed)
[facility]  [address] [place],  [place]  [address] [phone]  Clinic Day:  08/09/2021  Referring physician: Myrlene Broker, MD  This document serves as a record of services personally performed by Hosie Poisson, MD. It was created on their behalf by [facility] E, a trained medical scribe. The creation of this record is based on the scribe's personal observations and the provider's statements to them.  CHIEF COMPLAINT:  CC: Lung cancer  Current Treatment:  Diagnostics   HISTORY OF PRESENT ILLNESS:  Anthony Hartman is a 80 y.o. male referred by Dr. Janace Litten for the evaluation and treatment of lung cancer. This began when the patient presented to the [facility] emergency department on September 8th due to right sided rib and right axillary line pain, as well as gradually worsening shortness of breath and unintentional weight loss. Chest x-ray revealed small to moderate right pleural effusion with likely partial atelectasis of the right lower lobe. However, CT chest, abdomen and pelvis revealed an extensive, circumferential rind of tumor encasing the entire right lung. There were signs of chest wall invasion as well as invasion through the right hemidiaphragm. Loculated right pleural fluid collection was present. Enlarged right hilar, right CP angle and right paratracheal lymph nodes compatible with metastatic adenopathy were also observed. There was no evidence for solid organ metastasis or nodal metastasis within the abdomen or pelvis. He underwent right thoracentesis on September 12th yielding 275 mL serosanguineous pleural fluid. Diagnostic have been ordered. He may have had exposure to asbestos and or chemicals in his youth. 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 He states that the underwent bypass surgery in October 2021 and has not been able to bounce back. He has lost a around 40 pounds total since October.  Of  note, the patient has a history of Gleason 7 prostate cancer, diagnosed March 2017 and treated with radiation as well as bladder carcinoma in situ.  He also has a significant history for hypertension, myocardial infarction, obstructive sleep apnea, GERD, depression, and anxiety.   INTERVAL HISTORY:  Anthony Hartman is here for follow up to review recent biopsy results and their implications. Biopsy from September 22nd of the right pleural mass confirmed poorly differentiated malignancy consistent with high-grade carcinoma. Immunohistochemistry revealed positivity with cytokeratin 8/18, patchy positivity with PAX8 and vimentin.  The tumor is negative with cytokeratin AE1/AE3, cytokeratin 7, cytokeratin 20, CD56, S100, Melan-A, Sox 10, desmin, CA-IX, calretinin, cytokeratin 5/6, WT1, CEA, D2-40, Napsin A, TTF-1 and MOC31. He reports severe fatigue, extremity weakness, nausea, poor appetite and weight loss. He also notes trouble swallowing and dry mouth. He spends over 75% of the day in bed. He is averaging 2 oxycodone 5 mg daily for his pain, and he may take a Tylenol with it if needed. He also reports right ear pain, for which I recommend antihistamines. I placed him on prednisone 10 mg daily to help his appetite, pain and sense of well being. White count has increased from 12.2 to 16.7, hemoglobin has decreased from 10.9 to 10.3, and platelets have improved from 502,000 to 460,000. Chemistries are remarkable for a sodium of 132, a BUN of 28, elevated liver function tests, and an albumin of 3.1. His  appetite is good, and he has lost another 6 pounds since his last visit.  He denies fever, chills or other signs of infection.  He denies vomiting, bowel issues, or abdominal pain.  He denies sore throat,  cough or dyspnea.  REVIEW OF SYSTEMS:  Review of Systems  Constitutional:  Positive for appetite change (poor), fatigue (and generalized weakness) and unexpected weight change (weight loss, another 6 pounds). Negative for  chills and fever.  HENT:   Positive for trouble swallowing (and dry mouth).        Right ear pain  Eyes: Negative.   Respiratory:  Positive for cough (occasionally productive with yellow sputum). Negative for chest tightness, hemoptysis, shortness of breath and wheezing.   Cardiovascular: Negative.  Negative for chest pain, leg swelling and palpitations.  Gastrointestinal:  Positive for nausea (intermittent). Negative for abdominal distention, abdominal pain, blood in stool, constipation, diarrhea and vomiting.  Endocrine: Negative.   Genitourinary: Negative.  Negative for difficulty urinating, dysuria, frequency and hematuria.   Musculoskeletal:  Positive for gait problem (unsteadiness). Negative for arthralgias, back pain, flank pain and myalgias.       Pain of the right rib cage  Skin: Negative.   Neurological:  Positive for extremity weakness (in a wheelchair) and gait problem (unsteadiness). Negative for dizziness, headaches, light-headedness, numbness, seizures and speech difficulty.  Hematological: Negative.   Psychiatric/Behavioral: Negative.  Negative for depression and sleep disturbance. The patient is not nervous/anxious.   All other systems reviewed and are negative.   VITALS:  Blood pressure (!) 98/54, pulse 73, temperature 97.6 F (36.4 C), temperature source Oral, resp. rate 18, height 6\' 4"  (1.93 m), weight 191 lb 12.8 oz (87 kg), SpO2 95 %.  Wt Readings from Last 3 Encounters:  08/09/21 191 lb 12.8 oz (87 kg)  08/03/21 198 lb (89.8 kg)  07/25/21 197 lb 6.4 oz (89.5 kg)    Body mass index is 23.35 kg/m.  Performance status (ECOG): 3 - Symptomatic, >50% confined to bed  PHYSICAL EXAM:  Physical Exam Constitutional:      General: He is in acute distress (extremely weak and depressed).     Appearance: Normal appearance. He is normal weight.  HENT:     Head: Normocephalic and atraumatic.     Ears:     Comments: Ears have bilateral serous otitis Eyes:     General:  No scleral icterus.    Extraocular Movements: Extraocular movements intact.     Conjunctiva/sclera: Conjunctivae normal.     Pupils: Pupils are equal, round, and reactive to light.  Cardiovascular:     Rate and Rhythm: Normal rate and regular rhythm.     Pulses: Normal pulses.     Heart sounds: Normal heart sounds. No murmur heard.   No friction rub. No gallop.  Pulmonary:     Effort: Pulmonary effort is normal. No respiratory distress.     Breath sounds: Normal breath sounds.  Abdominal:     General: Bowel sounds are normal. There is no distension.     Palpations: Abdomen is soft. There is no hepatomegaly, splenomegaly or mass.     Tenderness: There is no abdominal tenderness.  Musculoskeletal:        General: Normal range of motion.     Cervical back: Normal range of motion and neck supple.     Right lower leg: No edema.     Left lower leg: No edema.  Lymphadenopathy:     Cervical: No cervical adenopathy.  Skin:    General: Skin is warm and dry.     Coloration: Skin is pale.     Comments: Decreased skin turgor  Neurological:     General: No focal deficit present.  Mental Status: He is alert and oriented to person, place, and time. Mental status is at baseline.  Psychiatric:        Mood and Affect: Mood normal.        Behavior: Behavior normal.        Thought Content: Thought content normal.        Judgment: Judgment normal.   LABS:   CBC Latest Ref Rng & Units 08/09/2021 08/03/2021 07/25/2021  WBC - 16.7 15.4(H) 12.2  Hemoglobin 13.5 - 17.5 10.3(A) 11.5(L) 10.9(A)  Hematocrit 41 - 53 30(A) 36.8(L) 34(A)  Platelets 150 - 399 460(A) 567(H) 502(A)   CMP Latest Ref Rng & Units 08/09/2021 07/25/2021 07/20/2021  Glucose 70 - 99 mg/dL - - 88  BUN 4 - 21 28(A) 31(A) 24(H)  Creatinine 0.6 - 1.3 0.8 1.1 0.99  Sodium 137 - 147 132(A) 134(A) 132(L)  Potassium 3.4 - 5.3 4.8 4.6 4.7  Chloride 99 - 108 95(A) 99 97(L)  CO2 13 - 22 27(A) 24(A) 23  Calcium 8.7 - 10.7 9.4 9.4 9.2   Total Protein 6.5 - 8.1 g/dL - - 6.2(L)  Total Bilirubin 0.3 - 1.2 mg/dL - - 0.7  Alkaline Phos 25 - 125 394(A) 241(A) 228(H)  AST 14 - 40 63(A) 36 27  ALT 10 - 40 124(A) 49(A) 55(H)     Lab Results  Component Value Date   CEA1 1.1 07/25/2021   /  CEA  Date Value Ref Range Status  07/25/2021 1.1 0.0 - 4.7 ng/mL Final    Comment:    (NOTE)                             Nonsmokers          <3.9                             Smokers             <5.6 [employer] Electrochemiluminescence Immunoassay (ECLIA) Values obtained with different assay methods or kits cannot be used interchangeably.  Results cannot be interpreted as absolute evidence of the presence or absence of malignant disease. Performed At: [facility] [place], [place] [id number] Rush Farmer MD OJ:[id number]    Lab Results  Component Value Date   TIBC 162 (L) 07/25/2021   FERRITIN 1,792 (H) 07/25/2021   IRONPCTSAT 14 (L) 07/25/2021   No results found for: LDH  STUDIES:  DG Chest 2 View  Result Date: 07/20/2021 CLINICAL DATA:  right sided chest pain and arm pain EXAM: CHEST - 2 VIEW COMPARISON:  Multiple priors FINDINGS: The cardiomediastinal silhouette is within normal limits. Surgical changes of median sternotomy and heart valve replacement. Small to moderate right pleural effusion. No pneumothorax. Right lung base opacity. IMPRESSION: Small to moderate right pleural effusion with likely partial atelectasis of the right lower lobe. These findings are new/worsened since January 2022. Chest CT can be performed for further evaluation. Electronically Signed   By: Albin Felling M.D.   On: 07/20/2021 12:01   CT CHEST ABDOMEN PELVIS W CONTRAST  Result Date: 07/20/2021 CLINICAL DATA:  Unintentional weight loss.  Pain in right axilla. EXAM: CT CHEST, ABDOMEN, AND PELVIS WITH CONTRAST TECHNIQUE: Multidetector CT imaging of the chest, abdomen and pelvis was performed following the standard  protocol during bolus administration of intravenous contrast. CONTRAST:  53mL OMNIPAQUE IOHEXOL 350 MG/ML SOLN  COMPARISON:  06/27/2021 FINDINGS: CT CHEST FINDINGS Cardiovascular: The heart size appears within normal limits. No pericardial effusion. Mediastinum/Nodes: High right paratracheal lymph node measures 1.6 cm, image 20/3. Lower right paratracheal lymph node measures 1.7 cm, image 28/3. Right hilar lymph node is enlarged measuring 2.4 cm, image 30/3. Multiple right CP angle lymph nodes are identified measuring up to 1.6 cm, image 52/3. Normal appearance of the thyroid gland. The trachea appears patent and is midline. Normal appearance of the esophagus Lungs/Pleura: Thick, irregular rind of tumor encases the entire right lung with loculated pleural fluid overlying the right lower lobe. Sample measurements of the tumor rind include: -at the level of the posterior right apex this measures 1.2 cm, image 14/3. -At the level of the right mid lung this measures 2.2 cm laterally, image 25/3. -Within the deep right lateral costophrenic sulcus adjacent to the right hepatic lobe this measures 2.5 cm in thickness, image 68/3. Tumor involvement of the right lateral and posterior hemidiaphragm is suspected, image 79/6 and image 29/7. Moderate changes of centrilobular and paraseptal emphysema. Several calcified nodules are noted overlying the posterior left lower lobe. No definite evidence for tumor involvement of the left lung. Musculoskeletal: There are signs of right lateral chest wall involvement by pleural disease. Most notable is at the level of the mid right upper lobe where pleural tumor invades the chest wall and there are associated erosive changes involving the lateral aspect of the righta fourth rib, image 25/3. CT ABDOMEN PELVIS FINDINGS Hepatobiliary: Pleural tumor along the deep right costophrenic sulcus has mass effect upon the lateral aspect of the right hepatic lobe, image 69/3. No convincing evidence for  tumor metastasis to the liver. The gallbladder appears normal. Pancreas: Unremarkable. No pancreatic ductal dilatation or surrounding inflammatory changes. Spleen: Normal in size without focal abnormality. Adrenals/Urinary Tract: Normal appearance of the adrenal glands. Bilateral kidney cysts. The largest arises off the lateral cortex of the left mid kidney measuring 3.6 cm. Urinary bladder is unremarkable. Stomach/Bowel: Stomach is nondistended. No scratch set the appendix is visualized and appears normal. No bowel wall thickening, inflammation, or distension. Vascular/Lymphatic: Aortic atherosclerosis without aneurysm. No adenopathy identified within the abdomen or pelvis. Reproductive: Prostate gland appears atrophic. Other: No ascites or focal fluid collections. Musculoskeletal: Spondylosis noted within the lumbar spine. IMPRESSION: 1. Extensive, circumferential rind of tumor encases the entire right lung. There are signs of chest wall invasion as well as invasion through the right hemidiaphragm. Loculated right pleural fluid collection is present. Primary differential considerations include primary bronchogenic carcinoma with extensive pleural metastasis versus mesothelioma. Further investigation with PET-CT and tissue sampling advised. 2. Enlarged right hilar, right CP angle and right paratracheal lymph nodes compatible with metastatic adenopathy. 3. No evidence for solid organ metastasis or nodal metastasis within the abdomen or pelvis. 4. Emphysema and aortic atherosclerosis. Aortic Atherosclerosis (ICD10-I70.0) and Emphysema (ICD10-J43.9). Electronically Signed   By: Kerby Moors M.D.   On: 07/20/2021 15:18   CT BIOPSY  Result Date: 08/03/2021 INDICATION: No known primary, now with metastatic disease involving the right pleura. Please perform image biopsy for tissue diagnostic purposes. EXAM: CT AND ULTRASOUND GUIDED BIOPSY MASSLIKE THICKENING OF THE RIGHT PLEURA AT THE LEVEL OF THE RIGHT COSTOPHRENIC  ANGLE COMPARISON:  CT the chest, abdomen and pelvis-07/20/2021 MEDICATIONS: None. ANESTHESIA/SEDATION: Fentanyl 50 mcg IV; Versed 1.5 mg IV Sedation time: 15 minutes; The patient was continuously monitored during the procedure by the interventional radiology nurse under my direct supervision. CONTRAST:  None. COMPLICATIONS: None immediate.  PROCEDURE: Informed consent was obtained from the patient following an explanation of the procedure, risks, benefits and alternatives. A time out was performed prior to the initiation of the procedure. The patient was positioned supine on the CT table and a limited CT was performed for procedural planning demonstrating unchanged size and appearance of the masslike thickening involving the right costophrenic angle measuring at least 4.3 x 2.9 cm (image 29, series 3). The table position was marked and the mass was identified sonographically. The procedure was planned. The operative site was prepped and draped in the usual sterile fashion. Under direct ultrasound guidance, the pleural base mass was targeted with a 17 gauge coaxial needle. Multiple ultrasound images were saved for procedural documentation purposes. Appropriate positioning was confirmed with CT imaging. Next, under direct ultrasound guidance, 6 core needle biopsies were obtained and placed in formalin. The co-axial needle was removed and hemostasis was achieved with manual compression. A limited postprocedural CT was negative for hemorrhage or additional complication. A dressing was applied. The patient tolerated the procedure well without immediate postprocedural complication. IMPRESSION: Technically successful ultrasound and CT guided core needle biopsy of dominant pleural based mass at the level of the right costophrenic angle. Electronically Signed   By: Sandi Mariscal M.D.   On: 08/03/2021 16:40     SURGICAL PATHOLOGY  CASE: [id number]  PATIENT: Anthony Hartman  Surgical Pathology Report   Clinical History:  malignant pleural thickening, pleural nodule/mass  within the right costophrenic angle (cm)   FINAL MICROSCOPIC DIAGNOSIS:   A. PLEURAL MASS, RIGHT, NEEDLE CORE BIOPSY:  - Poorly differentiated malignancy consistent with high-grade carcinoma.  - See comment.   COMMENT:  The core biopsy showed large areas of necrosis with scattered foci of  viable malignancy characterized by cells with abundant pale to clear  cytoplasm with and marked nuclear atypia.  An extensive battery of  immunohistochemistry is performed and shows positivity with cytokeratin  8/18, patchy positivity with PAX8 and vimentin.  The tumor is negative  with cytokeratin AE1/AE3, cytokeratin 7, cytokeratin 20, CD56, S100,  Melan-A, Sox 10, desmin, CA-IX, calretinin, cytokeratin 5/6, WT1, CEA,  D2-40, Napsin A, TTF-1 and MOC31.  The immunohistochemical findings are  nonspecific and most consistent with poorly differentiated, high-grade  carcinoma.  Possible primaries include lung, genitourinary and  pancreaticobiliary.  There is sufficient tissue for additional testing.   HISTORY:   Allergies:  Allergies  Allergen Reactions   Amoxicillin-Pot Clavulanate Nausea And Vomiting   Statins Other (See Comments)    Muscle cramps   Sulfa Antibiotics Nausea And Vomiting   Dexamethasone Other (See Comments)    Frequent Urination [? HYPERGLYCEMIA? ]     Current Medications: Current Outpatient Medications  Medication Sig Dispense Refill   acetaminophen (TYLENOL) 500 MG tablet Take 1,000 mg by mouth every 6 (six) hours as needed for moderate pain or headache (pain.).     ALPRAZolam (XANAX) 1 MG tablet Take 0.5 mg by mouth at bedtime.     aspirin EC 81 MG tablet Take 81 mg by mouth daily. Swallow whole.     buPROPion (WELLBUTRIN XL) 300 MG 24 hr tablet Take 300 mg by mouth daily.     carvedilol (COREG) 6.25 MG tablet TAKE 1 TABLET BY MOUTH TWICE (2) DAILY 180 tablet 3   escitalopram (LEXAPRO) 10 MG tablet Take 10 mg by mouth  daily.     Evolocumab (REPATHA SURECLICK) 161 MG/ML SOAJ Inject 140 mg into the skin every 14 (fourteen) days. 6 mL 3  HYDROcodone-acetaminophen (NORCO/VICODIN) 5-325 MG tablet Take 0.5-1 tablets by mouth every 6 (six) hours as needed for moderate pain.     loratadine (CLARITIN) 10 MG tablet Take 10 mg by mouth daily as needed for allergies.     meloxicam (MOBIC) 7.5 MG tablet Take 1 tablet (7.5 mg total) by mouth daily as needed for pain. (Patient not taking: No sig reported) 30 tablet 3   Multiple Vitamin (MULTIVITAMIN WITH MINERALS) TABS tablet Take 1 tablet by mouth daily.     omeprazole (PRILOSEC) 40 MG capsule Take 40 mg by mouth daily.     oxyCODONE (OXY IR/ROXICODONE) 5 MG immediate release tablet Take 5 mg by mouth 3 (three) times daily as needed for severe pain.     polyethylene glycol (MIRALAX / GLYCOLAX) 17 g packet Take 17 g by mouth daily as needed for moderate constipation.     predniSONE (DELTASONE) 10 MG tablet Take 1 tablet (10 mg total) by mouth 2 (two) times daily with a meal. (Patient taking differently: Take 20 mg by mouth daily.) 60 tablet 3   senna (SENOKOT) 8.6 MG tablet Take 1-2 tablets by mouth daily as needed for constipation.     tamsulosin (FLOMAX) 0.4 MG CAPS capsule Take 0.4 mg by mouth daily.     No current facility-administered medications for this visit.     ASSESSMENT & PLAN:   Assessment:   Extensive, circumferential rind of tumor encasing the entire right lung. There were signs of chest wall invasion as well as invasion through the right hemidiaphragm. This is very suggestive of a mesothelioma, however, pathology has confirmed poorly differentiated malignancy consistent with high grade carcinoma. We reviewed pathology and treatment options today including chemotherapy and/or immunotherapy.  Supportive care was also discussed. In view of his performance status of 3, we have recommended palliative care and Hospice.  Enlarged right hilar, right CP angle and  right paratracheal lymph nodes compatible with metastatic adenopathy.   Loculated right pleural fluid. He underwent thoracentesis yielding 275 mL of of serosanguineous pleural fluid. Diagnostics did not reveal malignant cells. Most of the findings on CT scan are more related to tumor thickening of the pleura rather than fluid.  Generalized weakness, poor appetite and weight loss. He continues to have poor appetite and weight loss despite prednisone 10 mg daily. He has lost another 6 pounds. I will therefore try him on Remeron 15 mg at bedtime for his appetite and dyspnea if Dr.Robbins agrees. He is on two other antidepressants and so I will collaborate with him on how best to manage this.   Anemia. Iron was low with a saturation of 14%, and ferritin was elevated at 1,792. B12 and folate were normal.  Intermittent pain of the right ribcage. He has been averaging two oxycodone 5 mg daily. I advised that he take a Tylenol in addition to this to aid in his pain relief. He wishes to alternate the oxycodone and hydrocodone, but I think we can approach this in another way. One option might be a very low dose of long acting opioid such as MS Contin 15 mg every 12 hours.  Plan: This is a pleasant 80 year old male recently found to have lung cancer with extensive circumferential rind of tumor encasing the entire right lung. There were signs of chest wall invasion as well as invasion through the right hemidiaphragm. I was suspicious of mesothelioma, but pathology has confirmed poorly differentiated malignancy consistent with high grade carcinoma. He asked about surgical resection, but  as this is already spread to the surrounding tissue, this is not feasible. Treatment options were reviewed including chemotherapy and/or immunotherapy, but with his performance status of 3, I doubt he would be able to tolerate treatment. Comfort care measures were also discussed including Hospice and their services. They are in  agreement to proceed with Hospice, and so I will make the appropriate referral and have agreed to be his attending physician. He already has a DNR and Living Will in place. In view of his poor appetite and weight loss despite prednisone 10 mg daily, I will try him on Remeron 15 mg at bedtime if Dr. Unk Lightning agrees, and we will stop the Lexapro. I would recommend he try antihistamines for his ear pain. He and his wife understand and agree with this plan of care. I have answered their questions and they know to call with any concerns.  I provided 40 minutes of face-to-face time during this this encounter and > 50% was spent counseling as documented under my assessment and plan.    Derwood Kaplan, MD [facility] [address] [place] [address] Dept: [phone] Dept Fax: [phone]   I, Rita Ohara, am acting as scribe for Derwood Kaplan, MD  I have reviewed this report as typed by the medical scribe, and it is complete and accurate.  Hermina Barters

## 2021-08-08 NOTE — Telephone Encounter (Signed)
Patient's spouse stated patient wanted to check to see if we had a sooner Appt.  Unfortunately not

## 2021-08-09 ENCOUNTER — Encounter: Payer: Self-pay | Admitting: Oncology

## 2021-08-09 ENCOUNTER — Other Ambulatory Visit: Payer: Self-pay | Admitting: Oncology

## 2021-08-09 ENCOUNTER — Inpatient Hospital Stay (INDEPENDENT_AMBULATORY_CARE_PROVIDER_SITE_OTHER): Payer: PPO | Admitting: Oncology

## 2021-08-09 ENCOUNTER — Inpatient Hospital Stay: Payer: PPO

## 2021-08-09 ENCOUNTER — Telehealth: Payer: Self-pay | Admitting: Oncology

## 2021-08-09 VITALS — BP 98/54 | HR 73 | Temp 97.6°F | Resp 18 | Ht 76.0 in | Wt 191.8 lb

## 2021-08-09 DIAGNOSIS — C3490 Malignant neoplasm of unspecified part of unspecified bronchus or lung: Secondary | ICD-10-CM | POA: Diagnosis not present

## 2021-08-09 DIAGNOSIS — C3481 Malignant neoplasm of overlapping sites of right bronchus and lung: Secondary | ICD-10-CM

## 2021-08-09 DIAGNOSIS — R918 Other nonspecific abnormal finding of lung field: Secondary | ICD-10-CM | POA: Diagnosis not present

## 2021-08-09 DIAGNOSIS — D539 Nutritional anemia, unspecified: Secondary | ICD-10-CM | POA: Diagnosis not present

## 2021-08-09 LAB — BASIC METABOLIC PANEL
BUN: 28 — AB (ref 4–21)
CO2: 27 — AB (ref 13–22)
Chloride: 95 — AB (ref 99–108)
Creatinine: 0.8 (ref 0.6–1.3)
Glucose: 198
Potassium: 4.8 (ref 3.4–5.3)
Sodium: 132 — AB (ref 137–147)

## 2021-08-09 LAB — CBC AND DIFFERENTIAL
HCT: 30 — AB (ref 41–53)
Hemoglobin: 10.3 — AB (ref 13.5–17.5)
Neutrophils Absolute: 15.87
Platelets: 460 — AB (ref 150–399)
WBC: 16.7

## 2021-08-09 LAB — IRON AND TIBC
Iron: 24 ug/dL — ABNORMAL LOW (ref 45–182)
Saturation Ratios: 15 % — ABNORMAL LOW (ref 17.9–39.5)
TIBC: 155 ug/dL — ABNORMAL LOW (ref 250–450)
UIBC: 131 ug/dL

## 2021-08-09 LAB — HEPATIC FUNCTION PANEL
ALT: 124 — AB (ref 10–40)
AST: 63 — AB (ref 14–40)
Alkaline Phosphatase: 394 — AB (ref 25–125)
Bilirubin, Total: 0.6

## 2021-08-09 LAB — COMPREHENSIVE METABOLIC PANEL
Albumin: 3.1 — AB (ref 3.5–5.0)
Calcium: 9.4 (ref 8.7–10.7)

## 2021-08-09 LAB — CBC: RBC: 3.66 — AB (ref 3.87–5.11)

## 2021-08-09 LAB — FERRITIN: Ferritin: 2427 ng/mL — ABNORMAL HIGH (ref 24–336)

## 2021-08-09 LAB — VITAMIN B12: Vitamin B-12: 1421 pg/mL — ABNORMAL HIGH (ref 180–914)

## 2021-08-09 MED ORDER — OXYCODONE HCL 5 MG PO TABS: 5.0000 mg | ORAL_TABLET | ORAL | 0 refills | Status: AC | PRN

## 2021-08-09 NOTE — Telephone Encounter (Signed)
Per 9/28 LOS, patient scheduled for Oct Appt's.  Gave patient Appt Calendar

## 2021-08-10 ENCOUNTER — Telehealth: Payer: Self-pay

## 2021-08-10 ENCOUNTER — Other Ambulatory Visit: Payer: Self-pay | Admitting: Oncology

## 2021-08-10 ENCOUNTER — Other Ambulatory Visit: Payer: Self-pay | Admitting: Hematology and Oncology

## 2021-08-10 DIAGNOSIS — C3481 Malignant neoplasm of overlapping sites of right bronchus and lung: Secondary | ICD-10-CM

## 2021-08-10 MED ORDER — FAMOTIDINE 20 MG PO TABS: 20.0000 mg | ORAL_TABLET | Freq: Two times a day (BID) | ORAL | 0 refills | Status: AC

## 2021-08-10 MED ORDER — MORPHINE SULFATE ER 15 MG PO TBCR: 15.0000 mg | EXTENDED_RELEASE_TABLET | Freq: Two times a day (BID) | ORAL | 0 refills | Status: AC

## 2021-08-10 MED ORDER — CARVEDILOL 6.25 MG PO TABS: ORAL_TABLET | ORAL | 3 refills | Status: AC

## 2021-08-10 MED ORDER — MIRTAZAPINE 15 MG PO TABS: 15.0000 mg | ORAL_TABLET | Freq: Every day | ORAL | 5 refills | Status: AC

## 2021-08-10 NOTE — Telephone Encounter (Addendum)
[facility] sent erx for Pepcid, MS Contin BID, and Coreg refill. Pt to stop hydrocodone as well. I notified Juliann Pulse Isom,LPN, @ [facility]osp[facility] of below. She RBV orders.   RE: HOSPICE MEDS Received: Pattricia Boss, Chinita Pester, MD  Dairl Ponder, RN; Melodye Ped, NP [facility], pls take care of Rx, we stopped the Lexapro today, make sure they know .   Received call from [facility] to review pt meds.  1. Pt wants to switch from Prilosec 40mg  po qd to Pepcid 20mg  po BID. (Need script sent) 2. Needs eRx for Coreg 6.25 po BID 3. Pt had a few hydrocodone in the home. Kathy,LPN, asking if need to just discontinue that since you have sent OxyIR. Pt states the oxycodone works better. 4. Pt willing to start long acting pain medication(MS Contin 15mg  BID) whenever you choose.  Other meds he is taking are:  Prednisone 10mg  BID Xanax 1mg  1/2 tab @ HS Remeron 15mg  @ HS (you just sent) Wellbutrin XL 300mg  po qd Lexapro 10mg  po qd Flomax 0.4mg  po qd Senna 1-2 tabs po BID PRN Colace 100mg  po q HS Ditropan 5mg  1/2 tab qd PRN bladder spasms B12 1060mcg qd Aspirin 81mg  po qd Zofran 4mg  po q 8hr PRN N/V Mucinex DM 1 tab po q 4hr PRN Claritin 10mg  po qd PRN

## 2021-08-10 NOTE — Telephone Encounter (Signed)
Faxed all information to [facility]

## 2021-08-10 NOTE — Telephone Encounter (Signed)
-----   Message from Derwood Kaplan, MD sent at 08/09/2021  7:36 PM EDT ----- Regarding: hospice Pls refer to [facility].  New dx of extensive right lung cancer with PS 3 and severe weight loss.  Having some pain due to chest wall invasion, using oxy 5 bid.  I have encouraged him to use more.  We may need to add low dose long acting opioid.

## 2021-08-12 LAB — SOLUBLE TRANSFERRIN RECEPTOR: Transferrin Receptor: 14 nmol/L (ref 12.2–27.3)

## 2021-08-15 ENCOUNTER — Telehealth: Payer: Self-pay

## 2021-08-15 NOTE — Telephone Encounter (Addendum)
I spoke with Maudie Mercury to see if she had spoken with pt's wife this morning. She states pt's heart rate was 99, and BP was 119/70. Kim plans to call pt's wife back in a bit to have her check his heart rate again.  ----- Message from Melodye Ped, NP sent at 08/14/2021  4:18 PM EDT ----- Regarding: RE: Hospice update, hypotension I think if she wants to discontinue, she can. ----- Message ----- From: Dairl Ponder, RN Sent: 08/14/2021   3:48 PM EDT To: Melodye Ped, NP Subject: Hospice update, hypotension                    Received a call from [place], [place]. She reports that pt's BP was 88/50, sitting @ the table when she arrived. He is on Coreg 6.25mg  po BID. She told pt's wife not to give him this afternoons dose. Pt's wife to take his BP in morning before giving morning dose of Coreg. Kim told her that if his BP was less than 100/60, to not give the Coreg and call her. Do you agree with those recommendations? Would you want to discontinue or decrease Coreg? Please advise.

## 2021-08-16 ENCOUNTER — Telehealth: Payer: Self-pay

## 2021-08-16 NOTE — Telephone Encounter (Signed)
Melissa P. NP aware.

## 2021-08-17 ENCOUNTER — Other Ambulatory Visit: Payer: Self-pay | Admitting: Hematology and Oncology

## 2021-08-28 ENCOUNTER — Other Ambulatory Visit: Payer: Self-pay

## 2021-08-28 DIAGNOSIS — R451 Restlessness and agitation: Secondary | ICD-10-CM

## 2021-08-28 DIAGNOSIS — Z515 Encounter for palliative care: Secondary | ICD-10-CM

## 2021-08-28 MED ORDER — LORAZEPAM 1 MG PO TABS
1.0000 mg | ORAL_TABLET | ORAL | 0 refills | Status: DC | PRN
Start: 2021-08-28 — End: 2021-08-31

## 2021-08-30 ENCOUNTER — Telehealth: Payer: Self-pay | Admitting: Oncology

## 2021-08-30 NOTE — Telephone Encounter (Signed)
08/30/21 Patients wife called and cancelled all appts.Patient under Hospice care.

## 2021-08-31 ENCOUNTER — Other Ambulatory Visit: Payer: Self-pay

## 2021-08-31 DIAGNOSIS — R451 Restlessness and agitation: Secondary | ICD-10-CM

## 2021-08-31 DIAGNOSIS — R69 Illness, unspecified: Secondary | ICD-10-CM

## 2021-08-31 DIAGNOSIS — Z515 Encounter for palliative care: Secondary | ICD-10-CM

## 2021-08-31 DIAGNOSIS — C3491 Malignant neoplasm of unspecified part of right bronchus or lung: Secondary | ICD-10-CM

## 2021-08-31 MED ORDER — MORPHINE SULFATE (CONCENTRATE) 20 MG/ML PO SOLN: ORAL | 0 refills | Status: AC

## 2021-08-31 MED ORDER — LORAZEPAM 1 MG PO TABS: 1.0000 mg | ORAL_TABLET | ORAL | 0 refills | Status: AC | PRN

## 2021-08-31 NOTE — Telephone Encounter (Signed)
Kim,RN, Hospice nurse, reports pt is transitioning /actively dying. Pt no longer able to swallow the crushed oxycodone.Kim,RN, is requesting Roxanol 20mg /mL: 0.25-0.2mL q 2hr PRN pain/restlessness/breathlessness. Also, req to increase the ativan 1mg  po q 4hr PRN to q 2hr Prn agitation.

## 2021-09-01 ENCOUNTER — Ambulatory Visit: Payer: PPO | Admitting: Oncology

## 2021-09-01 ENCOUNTER — Other Ambulatory Visit: Payer: PPO

## 2021-09-04 ENCOUNTER — Telehealth: Payer: Self-pay

## 2021-09-04 NOTE — Telephone Encounter (Signed)
Date of death: Sep 07, 2021 Time of death: 12:23am Place of death: home Funeral Home: [facility]

## 2021-09-04 NOTE — Telephone Encounter (Signed)
-----   Message from Derwood Kaplan, MD sent at 09/04/2021  1:18 PM EDT ----- Regarding: death cert I need date and time of death for certificate, was on hospice

## 2021-09-12 DEATH — deceased

## 2021-09-18 ENCOUNTER — Ambulatory Visit: Payer: PPO | Admitting: Cardiovascular Disease

## 2021-10-27 ENCOUNTER — Ambulatory Visit: Payer: PPO | Admitting: Cardiology
